# Patient Record
Sex: Male | Born: 1975 | Race: Black or African American | Hispanic: No | Marital: Married | Smoking: Former smoker
Health system: Southern US, Community
[De-identification: ages and names within clinical notes are randomized; demographics above are authoritative.]

## PROBLEM LIST (undated history)

## (undated) DIAGNOSIS — I10 Essential (primary) hypertension: Secondary | ICD-10-CM

## (undated) DIAGNOSIS — R0902 Hypoxemia: Secondary | ICD-10-CM

## (undated) DIAGNOSIS — F1910 Other psychoactive substance abuse, uncomplicated: Secondary | ICD-10-CM

## (undated) DIAGNOSIS — M1990 Unspecified osteoarthritis, unspecified site: Secondary | ICD-10-CM

## (undated) DIAGNOSIS — F909 Attention-deficit hyperactivity disorder, unspecified type: Secondary | ICD-10-CM

## (undated) DIAGNOSIS — K219 Gastro-esophageal reflux disease without esophagitis: Secondary | ICD-10-CM

## (undated) DIAGNOSIS — I2510 Atherosclerotic heart disease of native coronary artery without angina pectoris: Secondary | ICD-10-CM

## (undated) DIAGNOSIS — I219 Acute myocardial infarction, unspecified: Secondary | ICD-10-CM

## (undated) DIAGNOSIS — T7840XA Allergy, unspecified, initial encounter: Secondary | ICD-10-CM

## (undated) DIAGNOSIS — M51369 Other intervertebral disc degeneration, lumbar region without mention of lumbar back pain or lower extremity pain: Secondary | ICD-10-CM

## (undated) DIAGNOSIS — E669 Obesity, unspecified: Secondary | ICD-10-CM

## (undated) DIAGNOSIS — M5126 Other intervertebral disc displacement, lumbar region: Secondary | ICD-10-CM

## (undated) DIAGNOSIS — G4730 Sleep apnea, unspecified: Secondary | ICD-10-CM

## (undated) DIAGNOSIS — M5136 Other intervertebral disc degeneration, lumbar region: Secondary | ICD-10-CM

## (undated) DIAGNOSIS — E7800 Pure hypercholesterolemia, unspecified: Secondary | ICD-10-CM

## (undated) DIAGNOSIS — Z8719 Personal history of other diseases of the digestive system: Secondary | ICD-10-CM

## (undated) HISTORY — DX: Attention-deficit hyperactivity disorder, unspecified type: F90.9

## (undated) HISTORY — DX: Atherosclerotic heart disease of native coronary artery without angina pectoris: I25.10

## (undated) HISTORY — PX: CORONARY ANGIOPLASTY: SHX604

## (undated) HISTORY — DX: Allergy, unspecified, initial encounter: T78.40XA

## (undated) HISTORY — DX: Sleep apnea, unspecified: G47.30

## (undated) HISTORY — DX: Other psychoactive substance abuse, uncomplicated: F19.10

## (undated) HISTORY — DX: Essential (primary) hypertension: I10

## (undated) HISTORY — DX: Obesity, unspecified: E66.9

## (undated) HISTORY — DX: Pure hypercholesterolemia, unspecified: E78.00

## (undated) HISTORY — DX: Hypoxemia: R09.02

## (undated) HISTORY — DX: Unspecified osteoarthritis, unspecified site: M19.90

## (undated) HISTORY — PX: CARDIAC CATHETERIZATION: SHX172

## (undated) HISTORY — DX: Acute myocardial infarction, unspecified: I21.9

## (undated) HISTORY — DX: Gastro-esophageal reflux disease without esophagitis: K21.9

## (undated) HISTORY — PX: ABSCESS DRAINAGE: SHX1119

---

## 2001-06-08 HISTORY — PX: ABSCESS DRAINAGE: SHX1119

## 2009-04-03 ENCOUNTER — Emergency Department (HOSPITAL_COMMUNITY): Admission: EM | Admit: 2009-04-03 | Discharge: 2009-04-03 | Payer: Self-pay | Admitting: Emergency Medicine

## 2010-04-23 ENCOUNTER — Emergency Department (HOSPITAL_COMMUNITY): Admission: EM | Admit: 2010-04-23 | Discharge: 2010-04-24 | Payer: Self-pay | Admitting: Emergency Medicine

## 2010-08-29 ENCOUNTER — Institutional Professional Consult (permissible substitution): Payer: Self-pay | Admitting: Cardiology

## 2010-09-11 LAB — POCT I-STAT, CHEM 8
Calcium, Ion: 1.23 mmol/L (ref 1.12–1.32)
Chloride: 101 mEq/L (ref 96–112)
Creatinine, Ser: 1 mg/dL (ref 0.4–1.5)
Hemoglobin: 16.3 g/dL (ref 13.0–17.0)
Potassium: 4.1 mEq/L (ref 3.5–5.1)
Sodium: 138 mEq/L (ref 135–145)
TCO2: 28 mmol/L (ref 0–100)

## 2010-09-11 LAB — URINALYSIS, ROUTINE W REFLEX MICROSCOPIC
Hgb urine dipstick: NEGATIVE
Nitrite: NEGATIVE
Urobilinogen, UA: 0.2 mg/dL (ref 0.0–1.0)
pH: 7 (ref 5.0–8.0)

## 2010-10-01 ENCOUNTER — Ambulatory Visit (INDEPENDENT_AMBULATORY_CARE_PROVIDER_SITE_OTHER): Payer: Medicaid Other | Admitting: Cardiology

## 2010-10-01 ENCOUNTER — Encounter: Payer: Self-pay | Admitting: Cardiology

## 2010-10-01 VITALS — BP 144/100 | HR 60 | Ht 75.0 in | Wt 290.4 lb

## 2010-10-01 DIAGNOSIS — R079 Chest pain, unspecified: Secondary | ICD-10-CM

## 2010-10-01 DIAGNOSIS — E1169 Type 2 diabetes mellitus with other specified complication: Secondary | ICD-10-CM | POA: Insufficient documentation

## 2010-10-01 DIAGNOSIS — E7800 Pure hypercholesterolemia, unspecified: Secondary | ICD-10-CM

## 2010-10-01 DIAGNOSIS — E785 Hyperlipidemia, unspecified: Secondary | ICD-10-CM | POA: Insufficient documentation

## 2010-10-01 DIAGNOSIS — R12 Heartburn: Secondary | ICD-10-CM | POA: Insufficient documentation

## 2010-10-01 DIAGNOSIS — I10 Essential (primary) hypertension: Secondary | ICD-10-CM | POA: Insufficient documentation

## 2010-10-01 DIAGNOSIS — E119 Type 2 diabetes mellitus without complications: Secondary | ICD-10-CM | POA: Insufficient documentation

## 2010-10-01 DIAGNOSIS — R0789 Other chest pain: Secondary | ICD-10-CM | POA: Insufficient documentation

## 2010-10-01 NOTE — Assessment & Plan Note (Signed)
I have recommended that he take his Crestor. He admits to being noncompliant. He needs to eat a heart healthy diet and lose weight.

## 2010-10-01 NOTE — Progress Notes (Signed)
[name] [name] Date of Birth: [birth year]   History of Present Illness: Mr. [name] [name] is seen at the request of Dr. Gertie Gowda for evaluation of chest pain. He is a 35 year old African American male who has multiple cardiac risk factors including a history of diabetes, hyperlipidemia, and hypertension. He states over the past one to 2 months he has been experiencing symptoms of burning chest pain across his precordium associated with exertion. He notices this particularly when he is walking fast or gas to walk up a lot of stairs. He has also noticed his during sex and with jogging. His symptoms resolve with rest and less than 5-10 minutes. He's had no resting symptoms. He doesn't notice as much discomfort when he his weight lifting. He has no known cardiac history. There is also a family history of early coronary disease.  Current Outpatient Prescriptions on File Prior to Visit  Medication Sig Dispense Refill  . lisinopril (PRINIVIL,ZESTRIL) 20 MG tablet Take 20 mg by mouth daily.        . metFORMIN (GLUCOPHAGE) 500 MG tablet Take 500 mg by mouth daily with breakfast.        . QUEtiapine Fumarate (SEROQUEL PO) Take by mouth daily.        [name] triamterene-hydrochlorothiazide (MAXZIDE-25) 37.5-25 MG per tablet Take 1 tablet by mouth daily.        . rosuvastatin (CRESTOR) 20 MG tablet Take 20 mg by mouth daily.          Allergies  Allergen Reactions  . Penicillins     Past Medical History  Diagnosis Date  . Hypertension   . Heartburn   . Diabetes mellitus   . Hypercholesterolemia   . ADHD (attention deficit hyperactivity disorder)     Past Surgical History  Procedure Date  . Abscess drainage     right arm; approx 2003    History  Smoking status  . Former Smoker  . Types: Cigarettes  . Quit date: 09/30/2009  Smokeless tobacco  . Not on file    History  Alcohol Use  . 6.0 oz/week  . 7 Cans of beer, 3 Shots of liquor per week    Family History  Problem Relation Age of Onset    . Hypertension Mother   . Diabetes Mother   . Hypertension Father   . Diabetes Father   . Heart disease Father     cabg 3; 55  . Hypertension Sister     Review of Systems: The review of systems is positive for tingling in his feet. He has no history of stroke or TIA. He denies any bleeding difficulty. All other systems were reviewed and are negative.  Physical Exam: BP 144/100  Pulse 60  Ht 6\' 3"  (1.905 m)  Wt 290 lb 6.4 oz (131.725 kg)  BMI 36.30 kg/m2 The patient is alert and oriented x 3.  The mood and affect are normal.  The skin is warm and dry. He has multiple tattoos. Color is normal.  The HEENT exam reveals that the sclera are nonicteric.  The mucous membranes are moist.  The carotids are 2+ without bruits.  There is no thyromegaly.  There is no JVD.  The lungs are clear.  The chest wall is non tender.  The heart exam reveals a regular rate with a normal S1 and S2.  There are no murmurs, gallops, or rubs.  The PMI is not displaced.   Abdominal exam reveals good bowel sounds.  There is no guarding or rebound.  There is no hepatosplenomegaly or tenderness.  There are no masses.  Exam of the legs reveal no clubbing, cyanosis, or edema.  The legs are without rashes.  The distal pulses are intact.  Cranial nerves II - XII are intact.  Motor and sensory functions are intact.  The gait is normal.  LABORATORY DATA: ECG demonstrates normal sinus rhythm with a first degree AV block. Pulse is 62 beats per minute. His ECG is otherwise normal. Blood work dated August 19, 2010 demonstrated normal CBC, TSH, RPR, B12 and folate levels, and complete chemistry panel. ALT was slightly elevated at 56. LDL cholesterol was 180.  Assessment / Plan:

## 2010-10-01 NOTE — Patient Instructions (Signed)
We will schedule you for a nuclear stress test to evaluate your chest pain.  Start taking ASA 325 mg daily.  Be sure to take your other medications daily.

## 2010-10-01 NOTE — Assessment & Plan Note (Signed)
Continue current therapy with lisinopril. Recommended low sodium.

## 2010-10-01 NOTE — Assessment & Plan Note (Signed)
This patient has multiple cardiac risk factors and has exertional chest pain. He is at intermediate to high risk of coronary disease. I recommended the addition of aspirin 325 mg daily. I have encouraged him to take his medications daily as he has been noncompliant. We will schedule him for a stress Myoview study. If abnormal he will need a cardiac catheterization.

## 2010-10-07 HISTORY — PX: CORONARY STENT PLACEMENT: SHX1402

## 2010-10-13 ENCOUNTER — Encounter: Payer: Self-pay | Admitting: *Deleted

## 2010-10-13 ENCOUNTER — Ambulatory Visit (INDEPENDENT_AMBULATORY_CARE_PROVIDER_SITE_OTHER): Payer: Medicaid Other | Admitting: Internal Medicine

## 2010-10-13 ENCOUNTER — Encounter: Payer: Self-pay | Admitting: Internal Medicine

## 2010-10-13 ENCOUNTER — Ambulatory Visit (HOSPITAL_BASED_OUTPATIENT_CLINIC_OR_DEPARTMENT_OTHER): Payer: Medicaid Other | Admitting: Radiology

## 2010-10-13 ENCOUNTER — Other Ambulatory Visit: Payer: Self-pay | Admitting: Internal Medicine

## 2010-10-13 DIAGNOSIS — R079 Chest pain, unspecified: Secondary | ICD-10-CM

## 2010-10-13 DIAGNOSIS — I10 Essential (primary) hypertension: Secondary | ICD-10-CM

## 2010-10-13 DIAGNOSIS — I491 Atrial premature depolarization: Secondary | ICD-10-CM

## 2010-10-13 DIAGNOSIS — E7800 Pure hypercholesterolemia, unspecified: Secondary | ICD-10-CM

## 2010-10-13 DIAGNOSIS — I4949 Other premature depolarization: Secondary | ICD-10-CM

## 2010-10-13 DIAGNOSIS — E119 Type 2 diabetes mellitus without complications: Secondary | ICD-10-CM

## 2010-10-13 LAB — BASIC METABOLIC PANEL
BUN: 9 mg/dL (ref 6–23)
CO2: 23 mEq/L (ref 19–32)
Glucose, Bld: 89 mg/dL (ref 70–99)
Sodium: 140 mEq/L (ref 135–145)

## 2010-10-13 LAB — CBC WITH DIFFERENTIAL/PLATELET
Basophils Absolute: 0.1 10*3/uL (ref 0.0–0.1)
Basophils Relative: 1 % (ref 0–1)
Eosinophils Absolute: 0.4 10*3/uL (ref 0.0–0.7)
Eosinophils Relative: 6 % — ABNORMAL HIGH (ref 0–5)
MCH: 31.1 pg (ref 26.0–34.0)
Monocytes Absolute: 0.3 10*3/uL (ref 0.1–1.0)
RBC: 4.73 MIL/uL (ref 4.22–5.81)

## 2010-10-13 LAB — PROTIME-INR: Prothrombin Time: 12.3 seconds (ref 11.6–15.2)

## 2010-10-13 LAB — APTT: aPTT: 32 seconds (ref 24–37)

## 2010-10-13 MED ORDER — REGADENOSON 0.4 MG/5ML IV SOLN
0.4000 mg | Freq: Once | INTRAVENOUS | Status: AC
  Administered 2010-10-13: 0.4 mg via INTRAVENOUS

## 2010-10-13 MED ORDER — TECHNETIUM TC 99M TETROFOSMIN IV KIT
33.0000 | PACK | Freq: Once | INTRAVENOUS | Status: AC | PRN
  Administered 2010-10-13: 33 via INTRAVENOUS

## 2010-10-13 NOTE — Progress Notes (Signed)
Patient is a 35 year old with no prior cardiac history.  He presents today after having a myoview done earlier today. The patient was referred by his internist for evaluation of CP. He says his symptoms began about 2 months ago.  Only occur with exercise.  Most recently he had a spell while mowing the lawn.  Does note SOB with acitvity but none at rest.  No PND. Today, he walked 8:41 sec in Bruce protocol.  Could not get HR up adequately.   DId have some CP .  Switched to [employer].  Experienced signif pain that went away with recovery. EKG showed some PVCs.   Myoview stress only images showed decreased uptake in the anterior wall.  He is currently pain free.  No SOB. HPI  Allergies  Allergen Reactions  . Penicillins     Current Outpatient Prescriptions  Medication Sig Dispense Refill  . lisinopril (PRINIVIL,ZESTRIL) 20 MG tablet Take 20 mg by mouth daily.        . metFORMIN (GLUCOPHAGE) 500 MG tablet Take 500 mg by mouth daily with breakfast.        . QUEtiapine Fumarate (SEROQUEL PO) Take by mouth daily.        [name] triamterene-hydrochlorothiazide (MAXZIDE-25) 37.5-25 MG per tablet Take 1 tablet by mouth. Not taking with the lisinopril.      [name] DISCONTD: rosuvastatin (CRESTOR) 20 MG tablet Take 20 mg by mouth daily. Patient never had this filled       No current facility-administered medications for this visit.   Facility-Administered Medications Ordered in Other Visits  Medication Dose Route Frequency Provider Last Rate Last Dose  . regadenoson (LEXISCAN) injection SOLN 0.4 mg  0.4 mg Intravenous Once Wendall Stade, MD   0.4 mg at 10/13/10 1314  . technetium tetrofosmin (TC-MYOVIEW) injection 33 milli Curie  33 milli Curie Intravenous Once PRN Wendall Stade, MD   33 milli Curie at 10/13/10 1315    Past Medical History  Diagnosis Date  . Hypertension   . Heartburn   . Diabetes mellitus   . Hypercholesterolemia   . ADHD (attention deficit hyperactivity disorder)   . Chest pain      Past Surgical History  Procedure Date  . Abscess drainage     right arm; approx 2003    Family History  Problem Relation Age of Onset  . Hypertension Mother   . Diabetes Mother   . Hypertension Father   . Diabetes Father   . Heart disease Father     cabg 3; 65  . Hypertension Sister     History   Social History  . Marital Status: Married    Spouse Name: N/A    Number of Children: 4  . Years of Education: N/A   Occupational History  . disabled    Social History Main Topics  . Smoking status: Former Smoker    Types: Cigarettes    Quit date: 09/30/2009  . Smokeless tobacco: Not on file  . Alcohol Use: 6.0 oz/week    7 Cans of beer, 3 Shots of liquor per week  . Drug Use: No  . Sexually Active: Not on file   Other Topics Concern  . Not on file   Social History Narrative  . No narrative on file    Review of Systems:  All systems reviewed.  They are negative to the above problem except as previously stated.  Father had CABG at age 18  Vital Signs: BP 120/84  Pulse  68  Ht 6\' 3"  (1.905 m)  Wt 288 lb (130.636 kg)  BMI 36.00 kg/m2  Physical Exam Patient is in NAD .  NO CP.  No SOB. HEENT:  Normocephalic, atraumatic. EOMI, PERRLA.  Neck: JVP is normal. No thyromegaly. No bruits.  Lungs: clear to auscultation. No rales no wheezes.  Heart: Regular rate and rhythm. Normal S1, S2. No S3.   No significant murmurs. PMI not displaced.  Abdomen:  Supple, nontender. Normal bowel sounds. No masses. No hepatomegaly.  Extremities:   Good distal pulses throughout. No lower extremity edema.  Musculoskeletal :moving all extremities.  Neuro:   alert and oriented x3.  CN II-XII grossly intact.   Assessment and Plan:

## 2010-10-13 NOTE — Assessment & Plan Note (Signed)
Will need to be followed. 

## 2010-10-13 NOTE — Patient Instructions (Addendum)
Lab work today Your physician has requested that you have a cardiac catheterization. Cardiac catheterization is used to diagnose and/or treat various heart conditions. Doctors may recommend this procedure for a number of different reasons. The most common reason is to evaluate chest pain. Chest pain can be a symptom of coronary artery disease (CAD), and cardiac catheterization can show whether plaque is narrowing or blocking your heart's arteries. This procedure is also used to evaluate the valves, as well as measure the blood flow and oxygen levels in different parts of your heart. For further information please visit [email or web address] Please follow instruction sheet, as given.

## 2010-10-13 NOTE — Progress Notes (Signed)
[facility] SITE 3 NUCLEAR MED [address] [place] [address] [phone]  Cardiology Nuclear Med Study  [name] [name] is a 35 y.o. male [id number] [birth year]   Nuclear Med Background Indication for Stress Test:  Evaluation for Ischemia History:  No previous documented CAD Cardiac Risk Factors: Family History - CAD, History of Smoking, Hypertension, Lipids and NIDDM  Symptoms:  Chest Pain with Exertion (last date of chest discomfort last night), Fatigue with Exertion and Light-Headedness   Nuclear Pre-Procedure Caffeine/Decaff Intake:  None NPO After: 7:00am   Lungs:  Clear IV 0.9% NS with Angio Cath:  22g  IV Site: R Antecubital  IV Started by:  Irean Hong, RN  Chest Size (in):  52 Cup Size: n/a  Height: 6\' 3"  (1.905 m)  Weight:  286 lb (129.729 kg)  BMI:  Body mass index is 35.75 kg/(m^2). Tech Comments:  n/a    Nuclear Med Study 1 or 2 day study: 1 Day  Stress Test Type:  Treadmill/Lexiscan  Reading MD: Arvilla Meres MD  Order Authorizing Provider:  Peter Swaziland, MD  Resting Radionuclide: Technetium 86m Tetrofosmin  Resting Radionuclide Dose: n/a mCi   Stress Radionuclide:  Technetium 25m Tetrofosmin  Stress Radionuclide Dose: 33 mCi           Stress Protocol Rest HR: 73 Stress HR: 153  Rest BP: 111/65 Stress BP: 189/100  Exercise Time (min): 10:45 METS: 10.1   Predicted Max HR: 187 bpm % Max HR: 81.82 bpm Rate Pressure Product: 02725   Dose of Adenosine (mg):  n/a Dose of Lexiscan: 0.4 mg  Dose of Atropine (mg): n/a Dose of Dobutamine: n/a mcg/kg/min (at max HR)  Stress Test Technologist: Irean Hong, RN  Nuclear Technologist:  Domenic Polite, CNMT     Rest Procedure:  Myocardial perfusion imaging was performed at rest 45 minutes following the intravenous administration of Technetium 46m Tetrofosmin. Rest ECG: NSR with 1st AVB, PAC's with blocked PAC's, and 1.7 second pause , PVC  Stress Procedure:  The patient attempted to  walk the treadmill utilizing the Bruce protocol for 8:35, but unable to continue due to chest burning 10/10. The patient received IV Lexiscan 0.4 mg over 15-seconds with concurrent low level exercise, and chest burning decreased to 8/10. Technetium 31m Tetrofosmin was injected at 30-seconds while the patient continued walking. There were no significant changes with Lexiscan. There was hypertensive response to exercise. The patient had frequent PAC's, blocked PAC's, occasional PVC's, couplets x 2, and 1.7 second pause at baseline  Quantitative spect images were obtained after a 45-minute delay. Stress ECG: Signifcant artifact. Appears to be mild non-diagnostic infero-lateral ST dperession at peak.  QPS Raw Data Images:  Normal; no motion artifact; normal heart/lung ratio. Stress Images:  There is decreased uptake in the anterior wall. Rest Images:  Not performed Subtraction (SDS):  No resting images performed. There is a significant anterior defect on stress images associated with severe exertional angina on treadmill. Transient Ischemic Dilatation (Normal <1.22):  n/a Lung/Heart Ratio (Normal <0.45):  .19   Quantitative Gated Spect Images QGS EDV:  216 ml QGS ESV:  114 ml QGS cine images:  Mild global HK. TID not assessed due to lack of resting images. QGS EF: 47%  Impression Exercise Capacity:  Lexiscan with low level exercise. BP Response:  Normal blood pressure response. Clinical Symptoms:  Significant chest pain. ECG Impression:  Mild nn-diagnostic infero-lateral ST depression Comparison with Prior Nuclear Study: No previous nuclear study performed  Overall  Impression:  Abnormal stress nuclear study. Stress test suggestive of anterior ischemia. Seen by Dr. Tenny Craw and arranged for cath today.

## 2010-10-13 NOTE — Assessment & Plan Note (Signed)
Continue meds.  Get labs.

## 2010-10-13 NOTE — Assessment & Plan Note (Signed)
Myoview with signif defect anteriorly.  Rest images not done.  History is concerning. I would recomm LHcath to define anatomy.  He is not having any rest pain Lenoria Farrier get precath labs.  Patient has chewed 4  Baby aspirin.    NTG given incase he has any CP.  He was told to call 911 if he does. Plan cath tomorrow.

## 2010-10-14 ENCOUNTER — Observation Stay (HOSPITAL_COMMUNITY)
Admission: RE | Admit: 2010-10-14 | Discharge: 2010-10-15 | Disposition: A | Discharge status: Home / Self Care | Payer: Medicaid Other | Source: Ambulatory Visit | Attending: Cardiology | Admitting: Cardiology

## 2010-10-14 ENCOUNTER — Encounter (HOSPITAL_COMMUNITY): Payer: Medicaid Other | Admitting: Radiology

## 2010-10-14 DIAGNOSIS — E785 Hyperlipidemia, unspecified: Secondary | ICD-10-CM | POA: Insufficient documentation

## 2010-10-14 DIAGNOSIS — I2510 Atherosclerotic heart disease of native coronary artery without angina pectoris: Principal | ICD-10-CM | POA: Insufficient documentation

## 2010-10-14 DIAGNOSIS — I10 Essential (primary) hypertension: Secondary | ICD-10-CM | POA: Insufficient documentation

## 2010-10-14 DIAGNOSIS — R079 Chest pain, unspecified: Secondary | ICD-10-CM | POA: Insufficient documentation

## 2010-10-14 DIAGNOSIS — E119 Type 2 diabetes mellitus without complications: Secondary | ICD-10-CM | POA: Insufficient documentation

## 2010-10-14 LAB — GLUCOSE, CAPILLARY: Glucose-Capillary: 99 mg/dL (ref 70–99)

## 2010-10-15 LAB — CBC
HCT: 40.2 % (ref 39.0–52.0)
MCH: 30.7 pg (ref 26.0–34.0)
RBC: 4.43 MIL/uL (ref 4.22–5.81)
WBC: 7 10*3/uL (ref 4.0–10.5)

## 2010-10-15 LAB — BASIC METABOLIC PANEL
BUN: 8 mg/dL (ref 6–23)
Chloride: 106 mEq/L (ref 96–112)
GFR calc Af Amer: >60 mL/min (ref >60)
Sodium: 140 mEq/L (ref 135–145)

## 2010-10-15 LAB — GLUCOSE, CAPILLARY

## 2010-10-16 ENCOUNTER — Encounter: Payer: Self-pay | Admitting: Cardiology

## 2010-10-17 ENCOUNTER — Emergency Department (HOSPITAL_COMMUNITY)
Admission: EM | Admit: 2010-10-17 | Discharge: 2010-10-18 | Disposition: A | Discharge status: Home / Self Care | Payer: Medicaid Other | Attending: Emergency Medicine | Admitting: Emergency Medicine

## 2010-10-17 ENCOUNTER — Emergency Department (HOSPITAL_COMMUNITY): Payer: Medicaid Other

## 2010-10-17 DIAGNOSIS — Z7982 Long term (current) use of aspirin: Secondary | ICD-10-CM | POA: Insufficient documentation

## 2010-10-17 DIAGNOSIS — R112 Nausea with vomiting, unspecified: Secondary | ICD-10-CM | POA: Insufficient documentation

## 2010-10-17 DIAGNOSIS — R51 Headache: Secondary | ICD-10-CM | POA: Insufficient documentation

## 2010-10-17 DIAGNOSIS — Z79899 Other long term (current) drug therapy: Secondary | ICD-10-CM | POA: Insufficient documentation

## 2010-10-17 LAB — POCT CARDIAC MARKERS

## 2010-10-17 LAB — COMPREHENSIVE METABOLIC PANEL
ALT: 38 U/L (ref 0–53)
Alkaline Phosphatase: 66 U/L (ref 39–117)
CO2: 23 mEq/L (ref 19–32)
Chloride: 98 mEq/L (ref 96–112)
GFR calc non Af Amer: 45 mL/min — ABNORMAL LOW (ref >60)
Glucose, Bld: 102 mg/dL — ABNORMAL HIGH (ref 70–99)
Potassium: 4.1 mEq/L (ref 3.5–5.1)
Sodium: 135 mEq/L (ref 135–145)

## 2010-10-17 LAB — CBC
HCT: 46.3 % (ref 39.0–52.0)
Hemoglobin: 16.3 g/dL (ref 13.0–17.0)
WBC: 15.1 10*3/uL — ABNORMAL HIGH (ref 4.0–10.5)

## 2010-10-17 LAB — DIFFERENTIAL
Eosinophils Absolute: 0.1 10*3/uL (ref 0.0–0.7)
Eosinophils Relative: 1 % (ref 0–5)
Lymphocytes Relative: 24 % (ref 12–46)
Neutrophils Relative %: 69 % (ref 43–77)

## 2010-10-18 LAB — URINALYSIS, ROUTINE W REFLEX MICROSCOPIC
Ketones, ur: NEGATIVE mg/dL
Nitrite: NEGATIVE
Specific Gravity, Urine: 1.013 (ref 1.005–1.030)
pH: 5.5 (ref 5.0–8.0)

## 2010-10-18 LAB — POCT CARDIAC MARKERS: Troponin i, poc: <0.05 ng/mL (ref 0.00–0.09)

## 2010-10-20 NOTE — Discharge Summary (Addendum)
[name], [name]             ACCOUNT NO.:  [id number]  MEDICAL RECORD NO.:  [id number]           PATIENT TYPE:  O  LOCATION:  6526                         FACILITY:  [facility]  PHYSICIAN:  Peter M. Swaziland, M.D.  DATE OF BIRTH:  July 12, 1975  DATE OF ADMISSION:  10/14/2010 DATE OF DISCHARGE:  10/15/2010                              DISCHARGE SUMMARY   DISCHARGE DIAGNOSES: 1. Newly diagnosed coronary artery disease by cath this admission.     a.     Cath was done because of an outpatient abnormal stress      Myoview.     b.     A 95% proximal left anterior descending stenosis status post      bare-metal stent, with residual 99% stenosis in the proximal      diagonal that was very small and less than 10% stenosis in the      circumflex.     c.     Normal left ventricular function with ejection fraction of      60% by cath, Oct 14, 2010. 2. Hyperlipidemia. 3. Hypertension. 4. Diabetes mellitus, non-insulin dependent. 5. Attention deficit hyperactivity disorder.  HOSPITAL COURSE:  [name] [name] is a 35 year old gentleman with history significant for hypertension, hyperlipidemia, and diabetes who presented to Dr. Elvis Coil office after referral from his primary care provider with complaints of 2 months of exertional chest pain, also occurring during sex and jogging, resolving with rest.  He has had no resting symptoms.  He also has a family history of early coronary disease.  He was started on aspirin and set up for a stress Myoview which was done on Oct 13, 2010, which was abnormal.  He was subsequently referred for cardiac catheterization, underwent this procedure yesterday with the above findings, and status post bare-metal stent placement to his proximal LAD.  EF was normal.  The patient tolerated the procedure well and has been doing well without complaints.  His blood pressure was on the higher side here in the hospital, so his lisinopril was increased to 40 mg daily.  He is  continued on his other home medicines with the exception of holding metformin post catheterization for 2 days.  He was not started on beta-blocker secondary to nocturnal bradycardia with block and PACs.  This can be reassessed as an outpatient as needed.  Dr. Swaziland has seen and examined him today and feels he is stable for discharge.  DISCHARGE LABORATORY DATA:  WBC 7.0, hemoglobin 13.6, hematocrit 40.2, platelet count 213.  Sodium 140, potassium 4.0, chloride 106, CO2 is 26, glucose 104, BUN 8, creatinine 0.82.  STUDIES:  Cardiac catheterization, Oct 14, 2010, please see full report for details as well as HPI for summary.  DISCHARGE MEDICATIONS: 1. Aspirin 81 mg daily. 2. Nitroglycerin sublingual 0.4 mg every 5 minutes as needed up to     three doses for chest pain. 3. Effient 10 mg daily. 4. Lisinopril 40 mg daily. 5. Crestor 20 mg daily. 6. Metformin 500 mg daily with instructions not to start until Oct 17, 2010. 7. Seroquel 50 mg one tablet daily. 8. Triamterene  and hydrochlorothiazide 37.5/25 mg daily.  DISPOSITION:  [name] [name] will be discharged in stable condition to home.  He is not to lift over 5 pounds for a week, participate in sexual activity for one week, or drive for 2 days.  He is to follow a low- sodium, heart-healthy diabetic diet and call or return if he notices any pain, swelling, bleeding, or pus at his cath site.  He will see Dante Gang, NP, for his first post hospital visit in [facility] on Oct 29, 2010, at 2:15 p.m.  DURATION OF DISCHARGE ENCOUNTER:  Greater than 30 minutes including physician and PA time.     Ronie Spies, P.A.C.   ______________________________ Peter M. Swaziland, M.D.    DD/MEDQ  D:  10/15/2010  T:  10/15/2010  Job:  [id number]  Electronically Signed by PETER Swaziland M.D. on 10/20/2010 09:40:04 AM Electronically Signed by Ronie Spies  on 10/27/2010 01:35:42 PM

## 2010-10-20 NOTE — Cardiovascular Report (Signed)
[name] [name], CARTON             ACCOUNT NO.:  [id number]  MEDICAL RECORD NO.:  [id number]           PATIENT TYPE:  O  LOCATION:  6526                         FACILITY:  [facility]  PHYSICIAN:  Peter M. Swaziland, M.D.  DATE OF BIRTH:  1976/04/09  DATE OF PROCEDURE:  10/14/2010 DATE OF DISCHARGE:                           CARDIAC CATHETERIZATION   INDICATIONS FOR PROCEDURE:  A 35 year old African American male with history of hypertension, diabetes, and hyperlipidemia, who presents with symptoms of chest pain over the past 2 months.  He had a stress Myoview study yesterday which was abnormal.  PROCEDURE:  Left heart catheterization, coronary and left ventricular angiography, and intracoronary stenting of the proximal LAD.  ACCESS:  Via the right radial artery using the standard Seldinger technique.  EQUIPMENT USED:  A 5-French easy right 4 cm catheter, 5-French 5-cm left Judkins catheter, 5-French pigtail catheter, 5-French arterial sheath, 6- French arterial sheath, 6-French left VODA4 guide, a Prowater wire, a 2.5- x 15-mm TREK balloon, a 4.0- x 24-mm VeriFLEX stent, a 4.0- x 20-mm [place] TREK balloon.  MEDICATIONS: 1. Versed 2 mg IV. 2. Fentanyl total 50 mcg IV. 3. Verapamil 3 mg intra-arterial. 4. Heparin bolus 5000 units. 5. Angiomax bolus of 0.75 mg/kg with subsequent infusion at 1.75 mg/kg     per hour, subsequent ACT was 376 seconds. 6. Nitroglycerin 200 mcg intracoronary x1. 7. Effient 60 mg p.o.  CONTRAST:  Omnipaque 215 mL.  HEMODYNAMIC DATA:  Aortic pressure is 130/97 with a mean of 114, left ventricular pressure is 132 with EDP of 20 mmHg.  ANGIOGRAPHIC DATA: 1. The right coronary artery arises and distributes normally.  It is a     dominant vessel.  It is normal. 2. The left coronary artery arises and distributes normally.  The left     main coronary is normal. 3. The left anterior descending artery has a segmental 95% stenosis in     the proximal vessel.  This  was a very large vessel.  The first     diagonal is very small in caliber and has a 99% stenosis proximally     with TIMI grade 1 flow. 4. The left circumflex coronary has minor irregularities, less than     10%. 5. Left ventricular angiography performed in the RAO view demonstrates     normal left ventricular size and contractility with normal systolic     function.  Ejection fraction is estimated at 60%.  We proceeded at this point with percutaneous intervention of the LAD. Using the above guide, we were able to cross the lesion easily with a Prowater wire.  We predilated the lesion using a 2.5- x 15-mm TREK balloon.  We then stented the lesion using a 4.0- x 24-mm VeriFLEX stent deployed at 9 atmospheres.  We postdilated using a 4.0- x 20-mm [place] trek balloon up to 16 atmospheres x2.  This yielded an excellent angiographic result with 0% residual stenosis and TIMI grade 3 flow.  It was noted that this was a very difficult procedure from the right radial approach.  We had difficulty cannulating both the right coronary artery and especially the  LAD.  We were unable to get into the right coronary artery with a 4-cm Judkins right.  For the left coronary artery, we tried a 3.5 and 4.0 cm left Judkins catheter, a Jackie left catheter, and easy left coronary catheter without success.  We were able to engage with a diagnostic left five catheter, but we were unable to engage with a left five guide catheter.  The VODA catheter did give [place] excellent support throughout the procedure.  FINAL INTERPRETATION: 1. Single-vessel obstructive atherosclerotic coronary artery disease. 2. Normal left ventricular function. 3. Successful stenting of the proximal left anterior descending using     a bare-metal stent.  PLAN:  We will continue aspirin and Effient.  The patient needs aggressive risk factor modification.          ______________________________ Peter M. Swaziland, M.D.     PMJ/MEDQ  D:   10/14/2010  T:  10/15/2010  Job:  [id number]  cc:   Levander Campion, M.D.  Electronically Signed by PETER Swaziland M.D. on 10/20/2010 09:39:55 AM

## 2010-10-29 ENCOUNTER — Ambulatory Visit (INDEPENDENT_AMBULATORY_CARE_PROVIDER_SITE_OTHER): Payer: Medicaid Other | Admitting: Nurse Practitioner

## 2010-10-29 ENCOUNTER — Encounter: Payer: Self-pay | Admitting: Nurse Practitioner

## 2010-10-29 VITALS — BP 110/80 | HR 84 | Ht 75.0 in | Wt 276.0 lb

## 2010-10-29 DIAGNOSIS — E785 Hyperlipidemia, unspecified: Secondary | ICD-10-CM

## 2010-10-29 DIAGNOSIS — I10 Essential (primary) hypertension: Secondary | ICD-10-CM

## 2010-10-29 DIAGNOSIS — I2510 Atherosclerotic heart disease of native coronary artery without angina pectoris: Secondary | ICD-10-CM

## 2010-10-29 DIAGNOSIS — E7800 Pure hypercholesterolemia, unspecified: Secondary | ICD-10-CM

## 2010-10-29 NOTE — Assessment & Plan Note (Signed)
Blood pressure looks good with his current regimen.

## 2010-10-29 NOTE — Assessment & Plan Note (Signed)
He is doing well. He is on his Plavix. We reviewed the need for exercise and to continue with his efforts at diet and weight loss. I will see him back in about 1 month. Patient is agreeable to this plan and will call if any problems develop in the interim.

## 2010-10-29 NOTE — Assessment & Plan Note (Signed)
He is on Crestor. We will check fasting labs at his next visit.

## 2010-10-29 NOTE — Progress Notes (Signed)
    [name] [name] Date of Birth: 11-29-1975   History of Present Illness: [name] [name] is seen today for a post hospital visit. He is seen for Dr. Sw[place]il[place]. He had an abnormal stress test and then had elective cath with PCI to the LAD. He has a bare metal stent in place. He is on Plavix. Blood pressure has been treated with ACE and diuretic. He is on Metformin for his diabetes. He is doing much better. He has no more burning chest pain. He is back jogging. He was cathed radially and has had no problems with his right arm. He is working on his weight. He is down about 12 pounds. He had stopped smoking last year but has just recently stopped smoking marijuana.  Current Outpatient Prescriptions on File Prior to Visit  Medication Sig Dispense Refill  . aspirin 81 MG tablet Take 81 mg by mouth daily.        . clopidogrel (PLAVIX) 75 MG tablet Take 75 mg by mouth daily.        [name] lisinopril (PRINIVIL,ZESTRIL) 20 MG tablet Take 40 mg by mouth daily.       . metFORMIN (GLUCOPHAGE) 500 MG tablet Take 500 mg by mouth daily with breakfast.        . QUEtiapine Fumarate (SEROQUEL PO) Take by mouth daily.        . rosuvastatin (CRESTOR) 20 MG tablet Take 20 mg by mouth daily.        [name] triamterene-hydrochlorothiazide (MAXZIDE-25) 37.5-25 MG per tablet Take 1 tablet by mouth. Not taking with the lisinopril.        Allergies  Allergen Reactions  . Penicillins     Past Medical History  Diagnosis Date  . Hypertension   . Diabetes mellitus   . Hypercholesterolemia   . ADHD (attention deficit hyperactivity disorder)   . CAD (coronary artery disease)     S/P cath May 2012 with BMS to the LAD    Past Surgical History  Procedure Date  . Abscess drainage     right arm; approx 2003  . Coronary stent placement May 2012    LAD    History  Smoking status  . Former Smoker  . Types: Cigarettes  . Quit date: 09/30/2009  Smokeless tobacco  . Not on file    History  Alcohol Use  . 6.0 oz/week  . 7 Cans  of beer, 3 Shots of liquor per week    Family History  Problem Relation Age of Onset  . Hypertension Mother   . Diabetes Mother   . Hypertension Father   . Diabetes Father   . Heart disease Father     cabg 3; 55  . Hypertension Sister     Review of Systems: The review of systems is as above. Overall he feels great.  All other systems were reviewed and are negative.  Physical Exam: BP 110/80  Pulse 84  Ht 6\' 3"  (1.905 m)  Wt 276 lb (125.193 kg)  BMI 34.50 kg/m2 Patient is pleasant and in no acute distress. Skin is warm and dry. Color is normal. He has multiple tattoos.  HEENT is unremarkable. Normocephalic/atraumatic. PERRL. Sclera are nonicteric. Neck is supple. No masses. No JVD. Lungs are clear. Cardiac exam shows a regular rate and rhythm. Abdomen is soft. Extremities are without edema. Right arm looks fine. Gait and ROM are intact. No gross neurologic deficits noted.  LABORATORY DATA:   Assessment / Plan:

## 2010-10-29 NOTE — Patient Instructions (Signed)
Stay on your current medicines. I will see you back in about 1 month. We will check fasting labs when you come back.

## 2010-11-10 ENCOUNTER — Ambulatory Visit: Payer: Medicaid Other | Admitting: Internal Medicine

## 2010-11-14 ENCOUNTER — Encounter: Payer: Self-pay | Admitting: Cardiology

## 2010-11-14 ENCOUNTER — Other Ambulatory Visit: Payer: Self-pay | Admitting: *Deleted

## 2010-11-14 MED ORDER — PRASUGREL HCL 10 MG PO TABS: 10.0000 mg | ORAL_TABLET | Freq: Every day | ORAL | Status: DC

## 2010-11-14 NOTE — Telephone Encounter (Signed)
Called stating needed refill on Effient. He is not taking Plavix.  Effient required prior authorization. Called [phone] and received prior auth for 1 year. Sent in to pharm.

## 2010-11-17 ENCOUNTER — Ambulatory Visit (HOSPITAL_COMMUNITY): Payer: Medicaid Other

## 2010-11-19 ENCOUNTER — Ambulatory Visit (HOSPITAL_COMMUNITY): Payer: Medicaid Other

## 2010-11-21 ENCOUNTER — Ambulatory Visit (HOSPITAL_COMMUNITY): Payer: Medicaid Other

## 2010-11-24 ENCOUNTER — Ambulatory Visit (HOSPITAL_COMMUNITY): Payer: Medicaid Other

## 2010-11-26 ENCOUNTER — Ambulatory Visit (HOSPITAL_COMMUNITY): Payer: Medicaid Other

## 2010-11-28 ENCOUNTER — Ambulatory Visit (HOSPITAL_COMMUNITY): Payer: Medicaid Other

## 2010-12-01 ENCOUNTER — Encounter: Payer: Self-pay | Admitting: Nurse Practitioner

## 2010-12-01 ENCOUNTER — Other Ambulatory Visit (INDEPENDENT_AMBULATORY_CARE_PROVIDER_SITE_OTHER): Payer: Medicaid Other | Admitting: *Deleted

## 2010-12-01 ENCOUNTER — Ambulatory Visit (INDEPENDENT_AMBULATORY_CARE_PROVIDER_SITE_OTHER): Payer: Medicaid Other | Admitting: Nurse Practitioner

## 2010-12-01 ENCOUNTER — Ambulatory Visit (HOSPITAL_COMMUNITY): Payer: Medicaid Other

## 2010-12-01 ENCOUNTER — Telehealth: Payer: Self-pay | Admitting: *Deleted

## 2010-12-01 VITALS — BP 118/84 | HR 64 | Ht 75.0 in | Wt 271.5 lb

## 2010-12-01 DIAGNOSIS — I2510 Atherosclerotic heart disease of native coronary artery without angina pectoris: Secondary | ICD-10-CM

## 2010-12-01 DIAGNOSIS — I10 Essential (primary) hypertension: Secondary | ICD-10-CM

## 2010-12-01 DIAGNOSIS — E7800 Pure hypercholesterolemia, unspecified: Secondary | ICD-10-CM

## 2010-12-01 DIAGNOSIS — E785 Hyperlipidemia, unspecified: Secondary | ICD-10-CM

## 2010-12-01 LAB — LIPID PANEL
Cholesterol: 152 mg/dL (ref 0–200)
HDL: 39.1 mg/dL (ref >39.00)
LDL Cholesterol: 88 mg/dL (ref 0–99)
Total CHOL/HDL Ratio: 4
Triglycerides: 124 mg/dL (ref 0.0–149.0)
VLDL: 24.8 mg/dL (ref 0.0–40.0)

## 2010-12-01 LAB — BASIC METABOLIC PANEL
BUN: 15 mg/dL (ref 6–23)
CO2: 25 mEq/L (ref 19–32)
Calcium: 9.4 mg/dL (ref 8.4–10.5)
Chloride: 107 mEq/L (ref 96–112)
Creatinine, Ser: 1.1 mg/dL (ref 0.4–1.5)
GFR: 97.88 mL/min (ref >60.00)
Glucose, Bld: 101 mg/dL — ABNORMAL HIGH (ref 70–99)
Potassium: 4.6 mEq/L (ref 3.5–5.1)
Sodium: 138 mEq/L (ref 135–145)

## 2010-12-01 LAB — HEPATIC FUNCTION PANEL
ALT: 35 U/L (ref 0–53)
AST: 19 U/L (ref 0–37)
Albumin: 4.5 g/dL (ref 3.5–5.2)
Alkaline Phosphatase: 58 U/L (ref 39–117)
Bilirubin, Direct: 0.1 mg/dL (ref 0.0–0.3)
Total Bilirubin: 0.5 mg/dL (ref 0.3–1.2)
Total Protein: 7.5 g/dL (ref 6.0–8.3)

## 2010-12-01 NOTE — Telephone Encounter (Signed)
Notified of lab results; will get LP,BMET,HFP in 6 mo. Will schedule recall.

## 2010-12-01 NOTE — Progress Notes (Signed)
[name] Date of Birth: [birth year]   History of Present Illness: [name] [name] is seen today for a one month visit. He is seen for Dr. [place]. He continues to do well. No chest pain or shortness of breath. He has lost 5 more pounds. He is compliant with his medicines. He is on Medicaid and cost is not a factor. He is now on Effient. He does have a bare metal stent in place. He is not smoking marijuana. Overall, he feels good. He does note some slight aching in his right arm ever since his procedure, but that continues to improve.   Current Outpatient Prescriptions on File Prior to Visit  Medication Sig Dispense Refill  . aspirin 81 MG tablet Take 81 mg by mouth daily.        [name] lisinopril (PRINIVIL,ZESTRIL) 20 MG tablet Take 40 mg by mouth daily.       . metFORMIN (GLUCOPHAGE) 500 MG tablet Take 500 mg by mouth daily with breakfast.        . prasugrel (EFFIENT) 10 MG TABS Take 1 tablet (10 mg total) by mouth daily.  30 tablet  5  . QUEtiapine Fumarate (SEROQUEL PO) Take by mouth daily.        . rosuvastatin (CRESTOR) 20 MG tablet Take 20 mg by mouth daily.        [name] triamterene-hydrochlorothiazide (MAXZIDE-25) 37.5-25 MG per tablet Take 1 tablet by mouth. Not taking with the lisinopril.      [name] DISCONTD: clopidogrel (PLAVIX) 75 MG tablet Take 75 mg by mouth daily.        [name] DISCONTD: prasugrel (EFFIENT) 10 MG TABS Take 10 mg by mouth daily.          Allergies  Allergen Reactions  . Penicillins     Past Medical History  Diagnosis Date  . Hypertension   . Diabetes mellitus   . Hypercholesterolemia   . ADHD (attention deficit hyperactivity disorder)   . CAD (coronary artery disease)     S/P cath May 2012 with BMS to the LAD    Past Surgical History  Procedure Date  . Abscess drainage     right arm; approx 2003  . Coronary stent placement May 2012    LAD    History  Smoking status  . Former Smoker  . Types: Cigarettes  . Quit date: 09/30/2009  Smokeless tobacco  . Not on  file    History  Alcohol Use  . 6.0 oz/week  . 7 Cans of beer, 3 Shots of liquor per week    Family History  Problem Relation Age of Onset  . Hypertension Mother   . Diabetes Mother   . Hypertension Father   . Diabetes Father   . Heart disease Father     cabg 3; 65  . Hypertension Sister     Review of Systems: The review of systems is positive for mild right arm aching.  All other systems were reviewed and are negative.  Physical Exam: BP 118/84  Pulse 64  Ht 6\' 3"  (1.905 m)  Wt 271 lb 8 oz (123.152 kg)  BMI 33.94 kg/m2 Patient is very pleasant and in no acute distress. He has multiple tattoos. He is rather large. Skin is warm and dry. Color is normal.  HEENT is unremarkable. Normocephalic/atraumatic. PERRL. Sclera are nonicteric. Neck is supple. No masses. No JVD. Lungs are clear. Cardiac exam shows a regular rate and rhythm. Abdomen is soft. Extremities are without edema. Gait and  ROM are intact. No gross neurologic deficits noted.  LABORATORY DATA: Pending  Assessment / Plan:

## 2010-12-01 NOTE — Assessment & Plan Note (Signed)
He is going to get his own blood pressure cuff and monitor at home. If he continues to lose weight, we may have to cut back his medicines. He is encouraged by this possibility.

## 2010-12-01 NOTE — Assessment & Plan Note (Signed)
Labs are checked today.  

## 2010-12-01 NOTE — Telephone Encounter (Signed)
Message copied by Lorayne Bender on Mon Dec 01, 2010  5:06 PM ------      Message from: Rosalio Macadamia      Created: Mon Dec 01, 2010  3:00 PM       Ok to report. Labs are satisfactory.  Stay on same medicines. Recheck BMET/HPF/LIPIDS  in 6 months.

## 2010-12-01 NOTE — Patient Instructions (Addendum)
We will see what your labs look like Stay on your current medicines Keep up with your exercise. Goal is 45 minutes each day. We will see you in 3 months. Monitor your blood pressure at home and record your readings. If readings are less than 115/80 consistently, let us know.

## 2010-12-01 NOTE — Assessment & Plan Note (Signed)
He continues to do well. I have left him on his current medicines including the Effient for now. He will see Dr. Sw[place]iland in about 3 months. He is to continue with smoking cessation, exercise and good diabetic control. Patient is agreeable to this plan and will call if any problems develop in the interim.

## 2010-12-03 ENCOUNTER — Ambulatory Visit (HOSPITAL_COMMUNITY): Payer: Medicaid Other

## 2010-12-05 ENCOUNTER — Ambulatory Visit (HOSPITAL_COMMUNITY): Payer: Medicaid Other

## 2010-12-08 ENCOUNTER — Ambulatory Visit (HOSPITAL_COMMUNITY): Payer: Medicaid Other

## 2010-12-10 ENCOUNTER — Ambulatory Visit (HOSPITAL_COMMUNITY): Payer: Medicaid Other

## 2010-12-12 ENCOUNTER — Other Ambulatory Visit: Payer: Self-pay | Admitting: *Deleted

## 2010-12-12 ENCOUNTER — Ambulatory Visit (HOSPITAL_COMMUNITY): Payer: Medicaid Other

## 2010-12-12 MED ORDER — LISINOPRIL 40 MG PO TABS: 40.0000 mg | ORAL_TABLET | Freq: Every day | ORAL | Status: DC

## 2010-12-12 NOTE — Telephone Encounter (Signed)
escribe medication per fax request  

## 2010-12-15 ENCOUNTER — Ambulatory Visit (HOSPITAL_COMMUNITY): Payer: Medicaid Other

## 2010-12-17 ENCOUNTER — Ambulatory Visit (HOSPITAL_COMMUNITY): Payer: Medicaid Other

## 2010-12-19 ENCOUNTER — Ambulatory Visit (HOSPITAL_COMMUNITY): Payer: Medicaid Other

## 2010-12-22 ENCOUNTER — Ambulatory Visit (HOSPITAL_COMMUNITY): Payer: Medicaid Other

## 2010-12-24 ENCOUNTER — Ambulatory Visit (HOSPITAL_COMMUNITY): Payer: Medicaid Other

## 2010-12-26 ENCOUNTER — Ambulatory Visit (HOSPITAL_COMMUNITY): Payer: Medicaid Other

## 2010-12-29 ENCOUNTER — Ambulatory Visit (HOSPITAL_COMMUNITY): Payer: Medicaid Other

## 2010-12-31 ENCOUNTER — Ambulatory Visit (HOSPITAL_COMMUNITY): Payer: Medicaid Other

## 2011-01-02 ENCOUNTER — Ambulatory Visit (HOSPITAL_COMMUNITY): Payer: Medicaid Other

## 2011-01-05 ENCOUNTER — Ambulatory Visit (HOSPITAL_COMMUNITY): Payer: Medicaid Other

## 2011-01-07 ENCOUNTER — Ambulatory Visit (HOSPITAL_COMMUNITY): Payer: Medicaid Other

## 2011-01-09 ENCOUNTER — Ambulatory Visit (HOSPITAL_COMMUNITY): Payer: Medicaid Other

## 2011-01-12 ENCOUNTER — Ambulatory Visit (HOSPITAL_COMMUNITY): Payer: Medicaid Other

## 2011-01-14 ENCOUNTER — Ambulatory Visit (HOSPITAL_COMMUNITY): Payer: Medicaid Other

## 2011-01-16 ENCOUNTER — Ambulatory Visit (HOSPITAL_COMMUNITY): Payer: Medicaid Other

## 2011-01-19 ENCOUNTER — Ambulatory Visit (HOSPITAL_COMMUNITY): Payer: Medicaid Other

## 2011-01-21 ENCOUNTER — Ambulatory Visit (HOSPITAL_COMMUNITY): Payer: Medicaid Other

## 2011-01-23 ENCOUNTER — Ambulatory Visit (HOSPITAL_COMMUNITY): Payer: Medicaid Other

## 2011-01-26 ENCOUNTER — Ambulatory Visit (HOSPITAL_COMMUNITY): Payer: Medicaid Other

## 2011-01-28 ENCOUNTER — Ambulatory Visit (HOSPITAL_COMMUNITY): Payer: Medicaid Other

## 2011-01-30 ENCOUNTER — Ambulatory Visit (HOSPITAL_COMMUNITY): Payer: Medicaid Other

## 2011-02-02 ENCOUNTER — Ambulatory Visit (HOSPITAL_COMMUNITY): Payer: Medicaid Other

## 2011-02-04 ENCOUNTER — Ambulatory Visit (HOSPITAL_COMMUNITY): Payer: Medicaid Other

## 2011-02-06 ENCOUNTER — Ambulatory Visit (HOSPITAL_COMMUNITY): Payer: Medicaid Other

## 2011-02-09 ENCOUNTER — Ambulatory Visit (HOSPITAL_COMMUNITY): Payer: Medicaid Other

## 2011-02-11 ENCOUNTER — Ambulatory Visit (HOSPITAL_COMMUNITY): Payer: Medicaid Other

## 2011-02-13 ENCOUNTER — Ambulatory Visit (HOSPITAL_COMMUNITY): Payer: Medicaid Other

## 2011-02-16 ENCOUNTER — Ambulatory Visit (HOSPITAL_COMMUNITY): Payer: Medicaid Other

## 2011-02-18 ENCOUNTER — Ambulatory Visit (HOSPITAL_COMMUNITY): Payer: Medicaid Other

## 2011-02-20 ENCOUNTER — Ambulatory Visit (HOSPITAL_COMMUNITY): Payer: Medicaid Other

## 2011-02-23 ENCOUNTER — Ambulatory Visit (HOSPITAL_COMMUNITY): Payer: Medicaid Other

## 2011-02-25 ENCOUNTER — Ambulatory Visit: Payer: Medicaid Other | Admitting: Cardiology

## 2011-02-25 ENCOUNTER — Ambulatory Visit (HOSPITAL_COMMUNITY): Payer: Medicaid Other

## 2011-02-26 ENCOUNTER — Telehealth: Payer: Self-pay | Admitting: Cardiology

## 2011-02-26 NOTE — Telephone Encounter (Signed)
Called stating he had some palpitations today while working out. Feels fine now. Is concerned about heart. Advised he missed app yesterday. States his mother told him but after the time to be here. Gave him an app to see Lawson Fiscal on Monday.

## 2011-02-26 NOTE — Telephone Encounter (Signed)
Pt said he was working out and was having palpatations please call

## 2011-02-27 ENCOUNTER — Ambulatory Visit (HOSPITAL_COMMUNITY): Payer: Medicaid Other

## 2011-03-02 ENCOUNTER — Encounter: Payer: Self-pay | Admitting: Nurse Practitioner

## 2011-03-02 ENCOUNTER — Ambulatory Visit (INDEPENDENT_AMBULATORY_CARE_PROVIDER_SITE_OTHER): Payer: Medicaid Other | Admitting: Nurse Practitioner

## 2011-03-02 ENCOUNTER — Ambulatory Visit (HOSPITAL_COMMUNITY): Payer: Medicaid Other

## 2011-03-02 VITALS — BP 108/76 | HR 76 | Ht 75.0 in | Wt 274.2 lb

## 2011-03-02 DIAGNOSIS — R002 Palpitations: Secondary | ICD-10-CM | POA: Insufficient documentation

## 2011-03-02 DIAGNOSIS — I2510 Atherosclerotic heart disease of native coronary artery without angina pectoris: Secondary | ICD-10-CM

## 2011-03-02 DIAGNOSIS — I10 Essential (primary) hypertension: Secondary | ICD-10-CM

## 2011-03-02 NOTE — Assessment & Plan Note (Signed)
He is doing well from this standpoint. I did tell him to stop his Effient when his current bottle is finished. He has a BMS in place. No history of MI. I will have him see Dr. [place]iland in 3 months. Patient is agreeable to this plan and will call if any problems develop in the interim.

## 2011-03-02 NOTE — Patient Instructions (Addendum)
You are having some "extra beats". This can be from too much caffeine or alcohol. They are not dangerous for you. I would suggest you cut back on these two things. If the skips continue or get worse, let me know. We will need to put a monitor on.  You may stop your Effient when your current supply runs out.  We will see you back in about 3 months. Call for any problems.

## 2011-03-02 NOTE — Assessment & Plan Note (Signed)
Blood pressure looks good. No change in other medicines.

## 2011-03-02 NOTE — Assessment & Plan Note (Signed)
He has PAC's on his EKG. I explained to him that these were not dangerous but could be a nuisance. He is not really symptomatic at this time. I encouraged him to cut back the alcohol and caffeine. If symptoms persist, we could place a monitor. Will tentatively see him in 3 months.

## 2011-03-02 NOTE — Progress Notes (Signed)
[name] [name] Date of Birth: 1976/03/11   History of Present Illness: [name] [name] is seen back today for a follow up visit. He is seen for Dr. [place]. He missed his recent follow up appointment with Dr. [place]iland. He has been doing well. He had a BMS placed to the LAD in May of 2012. He is on Effient. Cost is not a factor. No chest pain or chest burning. He has had one spell of some palpitations that occurred with working out at the gym last week. It scared him. It was just for a few seconds. He was not symptomatic but did get concerned. He is not smoking. No marijuana use. He continues to drink and admits probably to too much, especially prior to this episode of palpitations. He does not watch his caffeine. He has not been dizzy or lightheaded. He has had some left shoulder pain intermittently but has had a broken left collar bone in the past. He has no exertional symptoms whatsoever. He is taking his medicines. Blood sugars are good. Blood pressure has been good.   Current Outpatient Prescriptions on File Prior to Visit  Medication Sig Dispense Refill  . aspirin 81 MG tablet Take 81 mg by mouth daily.        [name] lisinopril (PRINIVIL,ZESTRIL) 40 MG tablet Take 1 tablet (40 mg total) by mouth daily.  30 tablet  5  . metFORMIN (GLUCOPHAGE) 500 MG tablet Take 500 mg by mouth daily with breakfast.        . prasugrel (EFFIENT) 10 MG TABS Take 1 tablet (10 mg total) by mouth daily.  30 tablet  5  . QUEtiapine Fumarate (SEROQUEL PO) Take by mouth daily.        . rosuvastatin (CRESTOR) 20 MG tablet Take 20 mg by mouth daily.        [name] triamterene-hydrochlorothiazide (MAXZIDE-25) 37.5-25 MG per tablet Take 1 tablet by mouth. Not taking with the lisinopril.        Allergies  Allergen Reactions  . Penicillins     Past Medical History  Diagnosis Date  . Hypertension   . Diabetes mellitus   . Hypercholesterolemia   . ADHD (attention deficit hyperactivity disorder)   . CAD (coronary artery disease)    S/P cath May 2012 with BMS to the LAD following abnormal stress test  . Obesity   . Drug abuse     history of marijuana use and alcohol    Past Surgical History  Procedure Date  . Abscess drainage     right arm; approx 2003  . Coronary stent placement May 2012    LAD    History  Smoking status  . Former Smoker  . Types: Cigarettes  . Quit date: 09/30/2009  Smokeless tobacco  . Not on file    History  Alcohol Use  . 6.0 oz/week  . 7 Cans of beer, 3 Shots of liquor per week    Family History  Problem Relation Age of Onset  . Hypertension Mother   . Diabetes Mother   . Hypertension Father   . Diabetes Father   . Heart disease Father     cabg 3; 15  . Hypertension Sister     Review of Systems: The review of systems is positive for recent spell of palpitations.  All other systems were reviewed and are negative.  Physical Exam: BP 108/76  Pulse 76  Ht 6\' 3"  (1.905 m)  Wt 274 lb 3.2 oz (124.376 kg)  BMI 34.27  kg/m2 Patient is very pleasant and in no acute distress. He is rather large. Has several tattoos. Skin is warm and dry. Color is normal.  HEENT is unremarkable. Normocephalic/atraumatic. PERRL. Sclera are nonicteric. Neck is supple. No masses. No JVD. Lungs are clear. Cardiac exam shows a regular rate and rhythm. He has frequent ectopics.  Abdomen is obese but soft. Extremities are without edema. Gait and ROM are intact. No gross neurologic deficits noted.  LABORATORY DATA: EKG shows sinus with PAC's today. 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 PR is 210 ms  Assessment / Plan:

## 2011-03-04 ENCOUNTER — Ambulatory Visit (HOSPITAL_COMMUNITY): Payer: Medicaid Other

## 2011-03-06 ENCOUNTER — Encounter: Payer: Self-pay | Admitting: Cardiology

## 2011-03-06 ENCOUNTER — Ambulatory Visit (HOSPITAL_COMMUNITY): Payer: Medicaid Other

## 2011-03-09 ENCOUNTER — Ambulatory Visit (HOSPITAL_COMMUNITY): Payer: Medicaid Other

## 2011-03-11 ENCOUNTER — Ambulatory Visit (HOSPITAL_COMMUNITY): Payer: Medicaid Other

## 2011-03-13 ENCOUNTER — Ambulatory Visit (HOSPITAL_COMMUNITY): Payer: Medicaid Other

## 2011-05-08 ENCOUNTER — Other Ambulatory Visit: Payer: Self-pay | Admitting: *Deleted

## 2011-05-08 MED ORDER — ATORVASTATIN CALCIUM 20 MG PO TABS: 20.0000 mg | ORAL_TABLET | Freq: Every day | ORAL | Status: DC

## 2011-05-08 NOTE — Telephone Encounter (Signed)
Received fax from [facility] stating they needed prior auth for Crestor. Called [phone] and spoke w/Tony. States will need to fail 2 other medications before approved. Lipitor was one they approved. Will change to Lipitor 20 mg. Sent [facility] to [facility]. Lm for pt that we were changing his Crestor to Lipitor.

## 2011-05-25 ENCOUNTER — Other Ambulatory Visit: Payer: Self-pay | Admitting: *Deleted

## 2011-05-27 ENCOUNTER — Ambulatory Visit (INDEPENDENT_AMBULATORY_CARE_PROVIDER_SITE_OTHER): Payer: Medicaid Other | Admitting: Cardiology

## 2011-05-27 ENCOUNTER — Encounter: Payer: Self-pay | Admitting: Cardiology

## 2011-05-27 ENCOUNTER — Other Ambulatory Visit (INDEPENDENT_AMBULATORY_CARE_PROVIDER_SITE_OTHER): Payer: Medicaid Other | Admitting: *Deleted

## 2011-05-27 VITALS — BP 122/84 | HR 74 | Ht 75.0 in | Wt 279.8 lb

## 2011-05-27 DIAGNOSIS — E7800 Pure hypercholesterolemia, unspecified: Secondary | ICD-10-CM

## 2011-05-27 DIAGNOSIS — I2510 Atherosclerotic heart disease of native coronary artery without angina pectoris: Secondary | ICD-10-CM

## 2011-05-27 DIAGNOSIS — I10 Essential (primary) hypertension: Secondary | ICD-10-CM

## 2011-05-27 DIAGNOSIS — E785 Hyperlipidemia, unspecified: Secondary | ICD-10-CM

## 2011-05-27 DIAGNOSIS — E119 Type 2 diabetes mellitus without complications: Secondary | ICD-10-CM

## 2011-05-27 LAB — HEPATIC FUNCTION PANEL
ALT: 49 U/L (ref 0–53)
AST: 26 U/L (ref 0–37)
Albumin: 4.6 g/dL (ref 3.5–5.2)
Alkaline Phosphatase: 53 U/L (ref 39–117)
Bilirubin, Direct: 0 mg/dL (ref 0.0–0.3)
Total Protein: 8.1 g/dL (ref 6.0–8.3)

## 2011-05-27 LAB — BASIC METABOLIC PANEL
CO2: 26 mEq/L (ref 19–32)
Chloride: 104 mEq/L (ref 96–112)
Glucose, Bld: 101 mg/dL — ABNORMAL HIGH (ref 70–99)
Potassium: 4.4 mEq/L (ref 3.5–5.1)
Sodium: 138 mEq/L (ref 135–145)

## 2011-05-27 LAB — LIPID PANEL: HDL: 53.2 mg/dL (ref >39.00)

## 2011-05-27 NOTE — Progress Notes (Signed)
[name] [name] Date of Birth: [birth year]   History of Present Illness: [name] [name] is seen back today for a follow up visit.  He has been doing well. He had a BMS placed to the LAD in May of 2012. No chest pain or chest burning. He denies any current palpitations. He does have occasional soreness in his left shoulder but this is different than his anginal symptoms. He is not smoking. No marijuana use. He continues to drink moderately.He does not watch his caffeine. He has not been dizzy or lightheaded. He has had some left shoulder pain intermittently but has had a broken left collar bone in the past. He has no exertional symptoms whatsoever. He is taking his medicines. Blood sugars are good. Blood pressure has been good.   Current Outpatient Prescriptions on File Prior to Visit  Medication Sig Dispense Refill  . aspirin 81 MG tablet Take 81 mg by mouth daily.        [name] atorvastatin (LIPITOR) 20 MG tablet Take 1 tablet (20 mg total) by mouth daily.  30 tablet  11  . lisinopril (PRINIVIL,ZESTRIL) 40 MG tablet Take 1 tablet (40 mg total) by mouth daily.  30 tablet  5  . metFORMIN (GLUCOPHAGE) 500 MG tablet Take 500 mg by mouth daily with breakfast.        . QUEtiapine Fumarate (SEROQUEL PO) Take by mouth daily.        [name] triamterene-hydrochlorothiazide (MAXZIDE-25) 37.5-25 MG per tablet Take 1 tablet by mouth. Not taking with the lisinopril.        Allergies  Allergen Reactions  . Penicillins     Past Medical History  Diagnosis Date  . Hypertension   . Diabetes mellitus   . Hypercholesterolemia   . ADHD (attention deficit hyperactivity disorder)   . CAD (coronary artery disease)     S/P cath May 2012 with BMS to the LAD following abnormal stress test  . Obesity   . Drug abuse     history of marijuana use and alcohol    Past Surgical History  Procedure Date  . Abscess drainage     right arm; approx 2003  . Coronary stent placement May 2012    LAD    History  Smoking status  .  Former Smoker  . Types: Cigarettes  . Quit date: 09/30/2009  Smokeless tobacco  . Not on file    History  Alcohol Use  . 6.0 oz/week  . 7 Cans of beer, 3 Shots of liquor per week    Family History  Problem Relation Age of Onset  . Hypertension Mother   . Diabetes Mother   . Hypertension Father   . Diabetes Father   . Heart disease Father     cabg 3; 83  . Hypertension Sister     Review of Systems: As noted in history of present illness.  All other systems were reviewed and are negative.  Physical Exam: BP 122/84  Pulse 74  Ht 6\' 3"  (1.905 m)  Wt 126.916 kg (279 lb 12.8 oz)  BMI 34.97 kg/m2 Patient is very pleasant and in no acute distress. He is rather large. Has several tattoos. Skin is warm and dry. Color is normal.  HEENT is unremarkable. Normocephalic/atraumatic. PERRL. Sclera are nonicteric. Neck is supple. No masses. No JVD. Lungs are clear. Cardiac exam shows a regular rate and rhythm. He has frequent ectopics.  Abdomen is obese but soft. Extremities are without edema. Gait and ROM are intact. No  gross neurologic deficits noted.  LABORATORY DATA: EKG shows sinus with PAC's today. 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 PR is 210 ms  Assessment / Plan:

## 2011-05-27 NOTE — Patient Instructions (Signed)
Continue your current medications.  We will call with the results of your blood work today.  I will see you again in 6 months.  You may resume weight lifting along with your aerobic exercise.  Reduce your alcohol intake.

## 2011-05-27 NOTE — Assessment & Plan Note (Signed)
We'll followup on his fasting lab work today. I stressed the importance of lifestyle modification given his advanced coronary disease at a young age.

## 2011-05-27 NOTE — Assessment & Plan Note (Signed)
He is not having any significant anginal symptoms. Continue to focus on risk factor modification. He is cleared to resume some weightlifting. I recommended he moderate his alcohol use. He needs to reduce his caffeine intake. I'll followup again in 6 months.

## 2011-05-27 NOTE — Assessment & Plan Note (Signed)
Blood pressure control is acceptable. 

## 2011-05-28 ENCOUNTER — Other Ambulatory Visit: Payer: Medicaid Other | Admitting: *Deleted

## 2011-06-03 ENCOUNTER — Telehealth: Payer: Self-pay | Admitting: *Deleted

## 2011-06-03 DIAGNOSIS — E785 Hyperlipidemia, unspecified: Secondary | ICD-10-CM

## 2011-06-03 MED ORDER — ATORVASTATIN CALCIUM 40 MG PO TABS: 40.0000 mg | ORAL_TABLET | Freq: Every day | ORAL | Status: DC

## 2011-06-03 NOTE — Telephone Encounter (Signed)
Message copied by Lorayne Bender on Wed Jun 03, 2011  1:51 PM ------      Message from: [place], PETER M      Created: Wed May 27, 2011  9:38 PM       Chemistries look very good including glucose. LDL has increased. Goal LDL is 70. Recommend increasing Lipitor to 40 mg daily and repeat HFP and lipids in 3 months.      Theron Arista [place]

## 2011-06-03 NOTE — Telephone Encounter (Signed)
Notified of lab results. Will increase Lipitor to 40 mg daily. Will send new Rx to [facility]. Advised will need to get fasting labs in 3 mo-put on recall schedule.

## 2011-06-08 ENCOUNTER — Ambulatory Visit: Payer: Medicaid Other | Admitting: *Deleted

## 2011-06-09 ENCOUNTER — Encounter: Payer: Self-pay | Admitting: *Deleted

## 2011-06-22 ENCOUNTER — Other Ambulatory Visit: Payer: Self-pay | Admitting: *Deleted

## 2011-06-22 MED ORDER — LISINOPRIL 40 MG PO TABS: 40.0000 mg | ORAL_TABLET | Freq: Every day | ORAL | Status: DC

## 2011-10-22 ENCOUNTER — Other Ambulatory Visit: Payer: Self-pay | Admitting: Cardiology

## 2011-10-22 MED ORDER — TRIAMTERENE-HCTZ 37.5-25 MG PO TABS: 1.0000 | ORAL_TABLET | Freq: Every day | ORAL | Status: DC

## 2011-10-23 DIAGNOSIS — M549 Dorsalgia, unspecified: Secondary | ICD-10-CM | POA: Insufficient documentation

## 2011-11-23 ENCOUNTER — Encounter: Payer: Self-pay | Admitting: Cardiology

## 2011-11-23 ENCOUNTER — Ambulatory Visit (INDEPENDENT_AMBULATORY_CARE_PROVIDER_SITE_OTHER): Payer: Medicaid Other | Admitting: Cardiology

## 2011-11-23 VITALS — BP 118/79 | HR 71 | Ht 75.0 in | Wt 281.0 lb

## 2011-11-23 DIAGNOSIS — I10 Essential (primary) hypertension: Secondary | ICD-10-CM

## 2011-11-23 DIAGNOSIS — E785 Hyperlipidemia, unspecified: Secondary | ICD-10-CM

## 2011-11-23 DIAGNOSIS — I2510 Atherosclerotic heart disease of native coronary artery without angina pectoris: Secondary | ICD-10-CM

## 2011-11-23 DIAGNOSIS — E119 Type 2 diabetes mellitus without complications: Secondary | ICD-10-CM

## 2011-11-23 LAB — LIPID PANEL
Cholesterol: 150 mg/dL (ref 0–200)
Triglycerides: 200 mg/dL — ABNORMAL HIGH (ref 0.0–149.0)

## 2011-11-23 LAB — BASIC METABOLIC PANEL
BUN: 15 mg/dL (ref 6–23)
Calcium: 9.4 mg/dL (ref 8.4–10.5)
Creatinine, Ser: 1 mg/dL (ref 0.4–1.5)

## 2011-11-23 LAB — HEMOGLOBIN A1C: Hgb A1c MFr Bld: 6.6 % — ABNORMAL HIGH (ref 4.6–6.5)

## 2011-11-23 LAB — HEPATIC FUNCTION PANEL
AST: 22 U/L (ref 0–37)
Albumin: 4.1 g/dL (ref 3.5–5.2)
Alkaline Phosphatase: 50 U/L (ref 39–117)
Bilirubin, Direct: 0 mg/dL (ref 0.0–0.3)
Total Protein: 7.6 g/dL (ref 6.0–8.3)

## 2011-11-23 NOTE — Progress Notes (Signed)
    [name] [name] Date of Birth: 11/25/1975   History of Present Illness: [name] [name] is seen back today for a follow up visit.   He had a BMS placed to the LAD in May of 2012. He reports he continues to do well without any symptoms of chest pain or shortness of breath. He is walking. He is not smoking cigarettes but is smoking marijuana. He feels that he is under a lot of stress. He thinks that his diabetes is under good control. He has no other new complaints.  Current Outpatient Prescriptions on File Prior to Visit  Medication Sig Dispense Refill  . aspirin 81 MG tablet Take 81 mg by mouth daily.        [name] atorvastatin (LIPITOR) 40 MG tablet Take 1 tablet (40 mg total) by mouth daily.  30 tablet  11  . lisinopril (PRINIVIL,ZESTRIL) 40 MG tablet Take 1 tablet (40 mg total) by mouth daily.  30 tablet  5  . metFORMIN (GLUCOPHAGE) 500 MG tablet Take 500 mg by mouth daily with breakfast.        . QUEtiapine Fumarate (SEROQUEL PO) Take by mouth daily.        [name] triamterene-hydrochlorothiazide (MAXZIDE-25) 37.5-25 MG per tablet Take 1 each (1 tablet total) by mouth daily. Not taking with the lisinopril.  30 tablet  5    Allergies  Allergen Reactions  . Penicillins     Past Medical History  Diagnosis Date  . Hypertension   . Diabetes mellitus   . Hypercholesterolemia   . ADHD (attention deficit hyperactivity disorder)   . CAD (coronary artery disease)     S/P cath May 2012 with BMS to the LAD following abnormal stress test  . Obesity   . Drug abuse     history of marijuana use and alcohol    Past Surgical History  Procedure Date  . Abscess drainage     right arm; approx 2003  . Coronary stent placement May 2012    LAD    History  Smoking status  . Former Smoker  . Types: Cigarettes  . Quit date: 09/30/2009  Smokeless tobacco  . Not on file    History  Alcohol Use  . 6.0 oz/week  . 7 Cans of beer, 3 Shots of liquor per week    Family History  Problem Relation Age of  Onset  . Hypertension Mother   . Diabetes Mother   . Hypertension Father   . Diabetes Father   . Heart disease Father     cabg 3; 81  . Hypertension Sister     Review of Systems: As noted in history of present illness.  All other systems were reviewed and are negative.  Physical Exam: BP 118/79  Pulse 71  Ht 6\' 3"  (1.905 m)  Wt 127.461 kg (281 lb)  BMI 35.12 kg/m2 Patient is very pleasant and in no acute distress. Has several tattoos. Skin is warm and dry. Color is normal.  HEENT is unremarkable. Normocephalic/atraumatic. PERRL. Sclera are nonicteric. Neck is supple. No masses. No JVD. Lungs are clear. Cardiac exam shows a regular rate and rhythm. There are no gallops, murmurs, or click. Abdomen is obese but soft. Extremities are without edema. Gait and ROM are intact. No gross neurologic deficits noted.  LABORATORY DATA:   Assessment / Plan:

## 2011-11-23 NOTE — Assessment & Plan Note (Signed)
His LDL was 107 on his last visit. Lipitor dose was increased. We will recheck fasting lab work today

## 2011-11-23 NOTE — Assessment & Plan Note (Signed)
He remains asymptomatic. He is no longer on Plavix. We will continue aspirin 81 mg daily. Continue risk factor modification.

## 2011-11-23 NOTE — Patient Instructions (Signed)
We will check fasting lab work today and call with the results.   Continue your same medication  I will see you again in 6 months.

## 2011-11-23 NOTE — Assessment & Plan Note (Signed)
Blood pressure is in excellent control on Maxzide and lisinopril. We'll check electrolytes and renal function today.

## 2011-12-20 ENCOUNTER — Other Ambulatory Visit: Payer: Self-pay | Admitting: Cardiology

## 2011-12-22 ENCOUNTER — Ambulatory Visit: Payer: Medicaid Other | Attending: Family Medicine | Admitting: Physical Therapy

## 2012-06-21 ENCOUNTER — Other Ambulatory Visit: Payer: Self-pay | Admitting: *Deleted

## 2012-06-21 MED ORDER — LISINOPRIL 40 MG PO TABS: 40.0000 mg | ORAL_TABLET | Freq: Every day | ORAL | Status: DC

## 2012-08-19 ENCOUNTER — Other Ambulatory Visit: Payer: Self-pay | Admitting: Cardiology

## 2012-09-18 ENCOUNTER — Other Ambulatory Visit: Payer: Self-pay | Admitting: Cardiology

## 2012-10-20 ENCOUNTER — Other Ambulatory Visit: Payer: Self-pay | Admitting: Cardiology

## 2012-10-23 ENCOUNTER — Other Ambulatory Visit: Payer: Self-pay | Admitting: Cardiology

## 2012-12-19 ENCOUNTER — Other Ambulatory Visit: Payer: Self-pay | Admitting: Cardiology

## 2013-01-19 ENCOUNTER — Other Ambulatory Visit: Payer: Self-pay | Admitting: Cardiology

## 2013-04-08 ENCOUNTER — Emergency Department (HOSPITAL_COMMUNITY): Payer: Medicaid Other

## 2013-04-08 ENCOUNTER — Emergency Department (HOSPITAL_COMMUNITY)
Admission: EM | Admit: 2013-04-08 | Discharge: 2013-04-08 | Disposition: A | Discharge status: Home / Self Care | Payer: Medicaid Other | Attending: Emergency Medicine | Admitting: Emergency Medicine

## 2013-04-08 ENCOUNTER — Encounter: Payer: Self-pay | Admitting: Physician Assistant

## 2013-04-08 ENCOUNTER — Other Ambulatory Visit: Payer: Self-pay | Admitting: Physician Assistant

## 2013-04-08 ENCOUNTER — Encounter (HOSPITAL_COMMUNITY): Payer: Self-pay | Admitting: Emergency Medicine

## 2013-04-08 DIAGNOSIS — F1021 Alcohol dependence, in remission: Secondary | ICD-10-CM | POA: Insufficient documentation

## 2013-04-08 DIAGNOSIS — R079 Chest pain, unspecified: Secondary | ICD-10-CM

## 2013-04-08 DIAGNOSIS — E119 Type 2 diabetes mellitus without complications: Secondary | ICD-10-CM | POA: Insufficient documentation

## 2013-04-08 DIAGNOSIS — R51 Headache: Secondary | ICD-10-CM | POA: Insufficient documentation

## 2013-04-08 DIAGNOSIS — Z880 Allergy status to penicillin: Secondary | ICD-10-CM | POA: Insufficient documentation

## 2013-04-08 DIAGNOSIS — I2510 Atherosclerotic heart disease of native coronary artery without angina pectoris: Secondary | ICD-10-CM | POA: Insufficient documentation

## 2013-04-08 DIAGNOSIS — Z79899 Other long term (current) drug therapy: Secondary | ICD-10-CM | POA: Insufficient documentation

## 2013-04-08 DIAGNOSIS — Z9861 Coronary angioplasty status: Secondary | ICD-10-CM | POA: Insufficient documentation

## 2013-04-08 DIAGNOSIS — I10 Essential (primary) hypertension: Secondary | ICD-10-CM | POA: Insufficient documentation

## 2013-04-08 DIAGNOSIS — E669 Obesity, unspecified: Secondary | ICD-10-CM | POA: Insufficient documentation

## 2013-04-08 DIAGNOSIS — E7800 Pure hypercholesterolemia, unspecified: Secondary | ICD-10-CM | POA: Insufficient documentation

## 2013-04-08 DIAGNOSIS — Z8659 Personal history of other mental and behavioral disorders: Secondary | ICD-10-CM | POA: Insufficient documentation

## 2013-04-08 DIAGNOSIS — Z87891 Personal history of nicotine dependence: Secondary | ICD-10-CM | POA: Insufficient documentation

## 2013-04-08 DIAGNOSIS — Z7982 Long term (current) use of aspirin: Secondary | ICD-10-CM | POA: Insufficient documentation

## 2013-04-08 LAB — BASIC METABOLIC PANEL
BUN: 9 mg/dL (ref 6–23)
CO2: 21 mEq/L (ref 19–32)
Calcium: 9.4 mg/dL (ref 8.4–10.5)
Creatinine, Ser: 0.84 mg/dL (ref 0.50–1.35)
GFR calc Af Amer: >90 mL/min (ref >90)
Glucose, Bld: 118 mg/dL — ABNORMAL HIGH (ref 70–99)
Sodium: 140 mEq/L (ref 135–145)

## 2013-04-08 LAB — CBC WITH DIFFERENTIAL/PLATELET
Basophils Relative: 0 % (ref 0–1)
Eosinophils Absolute: 0 10*3/uL (ref 0.0–0.7)
Eosinophils Relative: 0 % (ref 0–5)
Hemoglobin: 13.4 g/dL (ref 13.0–17.0)
Lymphs Abs: 1.9 10*3/uL (ref 0.7–4.0)
MCH: 30.5 pg (ref 26.0–34.0)
MCHC: 33.8 g/dL (ref 30.0–36.0)
MCV: 90.2 fL (ref 78.0–100.0)
Monocytes Relative: 5 % (ref 3–12)
Neutro Abs: 2.9 10*3/uL (ref 1.7–7.7)
Platelets: 265 10*3/uL (ref 150–400)
RBC: 4.4 MIL/uL (ref 4.22–5.81)

## 2013-04-08 MED ORDER — ASPIRIN 81 MG PO CHEW
324.0000 mg | CHEWABLE_TABLET | Freq: Once | ORAL | Status: AC
  Administered 2013-04-08: 324 mg via ORAL
  Filled 2013-04-08: qty 4

## 2013-04-08 MED ORDER — DIPHENHYDRAMINE HCL 50 MG/ML IJ SOLN
50.0000 mg | Freq: Once | INTRAMUSCULAR | Status: AC
  Administered 2013-04-08: 50 mg via INTRAVENOUS
  Filled 2013-04-08: qty 1

## 2013-04-08 MED ORDER — ONDANSETRON 4 MG PO TBDP: ORAL_TABLET | ORAL | Status: DC

## 2013-04-08 MED ORDER — METOCLOPRAMIDE HCL 5 MG/ML IJ SOLN
10.0000 mg | Freq: Once | INTRAMUSCULAR | Status: AC
  Administered 2013-04-08: 10 mg via INTRAVENOUS
  Filled 2013-04-08: qty 2

## 2013-04-08 MED ORDER — SODIUM CHLORIDE 0.9 % IV BOLUS (SEPSIS)
1000.0000 mL | Freq: Once | INTRAVENOUS | Status: AC
  Administered 2013-04-08: 1000 mL via INTRAVENOUS

## 2013-04-08 MED ORDER — MORPHINE SULFATE 4 MG/ML IJ SOLN
6.0000 mg | Freq: Once | INTRAMUSCULAR | Status: AC
  Administered 2013-04-08: 6 mg via INTRAVENOUS
  Filled 2013-04-08: qty 2

## 2013-04-08 MED ORDER — KETOROLAC TROMETHAMINE 30 MG/ML IJ SOLN
30.0000 mg | Freq: Once | INTRAMUSCULAR | Status: AC
  Administered 2013-04-08: 30 mg via INTRAVENOUS
  Filled 2013-04-08: qty 1

## 2013-04-08 NOTE — ED Notes (Signed)
Per EMS report, patient started having L chest pain x 1 hour ago.   Patient has history of stent placement x 2 yrs.  Patient stated headache x 2 days.   Upon EMS report 8/10 dull pain in L chest that radiates to L neck.   Patient had 2 NTG SL on the way here that brought pain down to 5/10.    Patient had 4 zofran .   18 L AC placed by EMS.

## 2013-04-08 NOTE — Progress Notes (Signed)
Patient ID: [name] [name], male   DOB: 22-Jan-1976, 37 y.o.   MRN: [id number] Asked by Dr. Tenny Craw on weekend to arrange f/u myoview and clinic appointment with Dr. [place] for atypical chest pain. I have left a message on our office's scheduling voicemail requesting these, and our office will call the patient with these appointments. Dayna Dunn PA-C

## 2013-04-08 NOTE — ED Provider Notes (Signed)
CSN: [id number]     Arrival date & time 04/08/13  1019 History   First MD Initiated Contact with Patient 04/08/13 1020     Chief Complaint  Patient presents with  . Chest Pain  . Headache   (Consider location/radiation/quality/duration/timing/severity/associated sxs/prior Treatment) HPI Comments: 37 yo male with cardiac stent hx, lipids, htn, cholesterol presents with ha and chest ache.  Pt had chest ache left sided from 7 until 8 am today, clarified with pt, constant, mild neck radiation, no sob/ diaphoresis or exertional component.  Pt has had intermittent HA frontal for a month, gradual onset, and worsening.  No injuries or known CNS issues. No camping, sick contacts or neck stiffness.  Past smoker. Not worse in am.   Patient is a 37 y.o. male presenting with chest pain and headaches. The history is provided by the patient.  Chest Pain Associated symptoms: headache   Associated symptoms: no abdominal pain, no back pain, no fever, no numbness, no shortness of breath, not vomiting and no weakness   Headache Associated symptoms: neck pain   Associated symptoms: no abdominal pain, no back pain, no congestion, no fever, no neck stiffness, no numbness and no vomiting     Past Medical History  Diagnosis Date  . Hypertension   . Diabetes mellitus   . Hypercholesterolemia   . ADHD (attention deficit hyperactivity disorder)   . CAD (coronary artery disease)     S/P cath May 2012 with BMS to the LAD following abnormal stress test  . Obesity   . Drug abuse     history of marijuana use and alcohol   Past Surgical History  Procedure Laterality Date  . Abscess drainage      right arm; approx 2003  . Coronary stent placement  May 2012    LAD   Family History  Problem Relation Age of Onset  . Hypertension Mother   . Diabetes Mother   . Hypertension Father   . Diabetes Father   . Heart disease Father     cabg 3; 21  . Hypertension Sister    History  Substance Use Topics  .  Smoking status: Former Smoker    Types: Cigarettes    Quit date: 09/30/2009  . Smokeless tobacco: Not on file  . Alcohol Use: 6.0 oz/week    7 Cans of beer, 3 Shots of liquor per week    Review of Systems  Constitutional: Positive for appetite change. Negative for fever and chills.  HENT: Negative for congestion.   Eyes: Negative for visual disturbance.  Respiratory: Negative for shortness of breath.   Cardiovascular: Positive for chest pain.  Gastrointestinal: Negative for vomiting and abdominal pain.  Genitourinary: Negative for dysuria and flank pain.  Musculoskeletal: Positive for neck pain. Negative for back pain and neck stiffness.  Skin: Negative for rash.  Neurological: Positive for headaches. Negative for weakness, light-headedness and numbness.    Allergies  Penicillins  Home Medications   Current Outpatient Rx  Name  Route  Sig  Dispense  Refill  . aspirin 81 MG tablet   Oral   Take 81 mg by mouth daily.          [name] atorvastatin (LIPITOR) 20 MG tablet   Oral   Take 20 mg by mouth daily.         . carisoprodol (SOMA) 350 MG tablet   Oral   Take 350 mg by mouth 4 (four) times daily as needed for muscle spasms.         [name]  lisinopril (PRINIVIL,ZESTRIL) 40 MG tablet   Oral   Take 40 mg by mouth daily.         . metFORMIN (GLUCOPHAGE) 500 MG tablet   Oral   Take 500 mg by mouth daily with breakfast.         . EXPIRED: atorvastatin (LIPITOR) 40 MG tablet   Oral   Take 1 tablet (40 mg total) by mouth daily.   30 tablet   11    BP 115/75  Pulse 62  Temp(Src) 98.1 F (36.7 C) (Oral)  Resp 18  SpO2 97% Physical Exam  Nursing note and vitals reviewed. Constitutional: He is oriented to person, place, and time. He appears well-developed and well-nourished.  HENT:  Head: Normocephalic and atraumatic.  Eyes: Conjunctivae are normal. Right eye exhibits no discharge. Left eye exhibits no discharge.  Neck: Normal range of motion. Neck supple. No  tracheal deviation present.  Cardiovascular: Normal rate, regular rhythm and intact distal pulses.   Pulmonary/Chest: Effort normal and breath sounds normal.  Abdominal: Soft. He exhibits no distension. There is no tenderness. There is no guarding.  Musculoskeletal: He exhibits no edema.  Neurological: He is alert and oriented to person, place, and time.  Skin: Skin is warm. No rash noted.  Psychiatric: He has a normal mood and affect.    ED Course  Procedures (including critical care time) Labs Review Labs Reviewed  BASIC METABOLIC PANEL - Abnormal; Notable for the following:    Glucose, Bld 118 (*)    All other components within normal limits  CBC WITH DIFFERENTIAL  TROPONIN I  TROPONIN I  TROPONIN I   Imaging Review Dg Chest 2 View  04/08/2013   CLINICAL DATA:  Chest pain radiating to the head and neck.  EXAM: CHEST  2 VIEW  COMPARISON:  10/18/2010.  FINDINGS: The heart size and mediastinal contours are within normal limits. Both lungs are clear. The visualized skeletal structures are unremarkable.  IMPRESSION: No active cardiopulmonary disease.   Electronically Signed   By: Andreas Newport M.D.   On: 04/08/2013 11:10   Ct Head Wo Contrast  04/08/2013   CLINICAL DATA:  Chest pain. Headache.  EXAM: CT HEAD WITHOUT CONTRAST  TECHNIQUE: Contiguous axial images were obtained from the base of the skull through the vertex without contrast.  COMPARISON:  None  FINDINGS: Normal appearance of the intracranial structures. No evidence for acute hemorrhage, mass lesion, midline shift, hydrocephalus or large infarct. No acute bony abnormality. The visualized sinuses are clear.  IMPRESSION: Negative exam.   Electronically Signed   By: Davonna Belling M.D.   On: 04/08/2013 12:13    EKG Interpretation     Ventricular Rate:  75 PR Interval:  198 QRS Duration: 110 QT Interval:  410 QTC Calculation: 458 R Axis:   -39 Text Interpretation:  Sinus rhythm Incomplete left bundle branch block No acute  findings          Repeat EKG no acute findings.  Date: 04/08/2013  Rate: 62  Rhythm: normal sinus rhythm  QRS Axis: left  Intervals: normal  ST/T Wave abnormalities: nonspecific ST changes  Conduction Disutrbances:none  Narrative Interpretation:   Old EKG Reviewed: unchanged   MDM  No diagnosis found. Chest pain, known CAD, no acute findings.  No cp in ED.  Plan for delta troponin and re-eval. CP does not occur with HA. HA non specific, no signs of meningitis, no concern for [facility].  Pain meds and CT head. Improved in ED.  CT no acute findings, reviewed.  Repeat EKG no acute findings. HA resolved in ED. No cp since 830 am.   Discussed with cardiology and reviewed troponin/ ekgs, recommended close outpt fup for stress test, they will Arrange.  Strict reasons to return discussed with pt/ family, any cp pt will come back ASA in ED. Pt on daily.   Results and differential diagnosis were discussed with the patient. Close follow up outpatient was discussed, patient comfortable with the plan.   Diagnosis: Chest pain, CAD, Headache  Enid Skeens, MD 04/08/13 1807

## 2013-04-26 ENCOUNTER — Encounter (HOSPITAL_COMMUNITY): Payer: Medicaid Other

## 2013-04-28 ENCOUNTER — Encounter: Payer: Medicaid Other | Admitting: Nurse Practitioner

## 2013-05-08 ENCOUNTER — Encounter (HOSPITAL_COMMUNITY): Payer: Medicaid Other

## 2013-06-02 ENCOUNTER — Encounter: Payer: Self-pay | Admitting: Nurse Practitioner

## 2017-04-20 ENCOUNTER — Other Ambulatory Visit: Payer: Self-pay

## 2017-04-20 ENCOUNTER — Encounter: Payer: Self-pay | Admitting: Emergency Medicine

## 2017-04-20 ENCOUNTER — Emergency Department
Admission: EM | Admit: 2017-04-20 | Discharge: 2017-04-20 | Disposition: A | Discharge status: Home / Self Care | Payer: Medicaid Other | Attending: Emergency Medicine | Admitting: Emergency Medicine

## 2017-04-20 DIAGNOSIS — M5416 Radiculopathy, lumbar region: Secondary | ICD-10-CM | POA: Insufficient documentation

## 2017-04-20 DIAGNOSIS — Y939 Activity, unspecified: Secondary | ICD-10-CM | POA: Diagnosis not present

## 2017-04-20 DIAGNOSIS — Z7984 Long term (current) use of oral hypoglycemic drugs: Secondary | ICD-10-CM | POA: Insufficient documentation

## 2017-04-20 DIAGNOSIS — X500XXA Overexertion from strenuous movement or load, initial encounter: Secondary | ICD-10-CM | POA: Diagnosis not present

## 2017-04-20 DIAGNOSIS — I10 Essential (primary) hypertension: Secondary | ICD-10-CM | POA: Diagnosis not present

## 2017-04-20 DIAGNOSIS — Y999 Unspecified external cause status: Secondary | ICD-10-CM | POA: Diagnosis not present

## 2017-04-20 DIAGNOSIS — Z7982 Long term (current) use of aspirin: Secondary | ICD-10-CM | POA: Insufficient documentation

## 2017-04-20 DIAGNOSIS — I2510 Atherosclerotic heart disease of native coronary artery without angina pectoris: Secondary | ICD-10-CM | POA: Diagnosis not present

## 2017-04-20 DIAGNOSIS — Z79899 Other long term (current) drug therapy: Secondary | ICD-10-CM | POA: Diagnosis not present

## 2017-04-20 DIAGNOSIS — S39012A Strain of muscle, fascia and tendon of lower back, initial encounter: Secondary | ICD-10-CM | POA: Insufficient documentation

## 2017-04-20 DIAGNOSIS — Z87891 Personal history of nicotine dependence: Secondary | ICD-10-CM | POA: Insufficient documentation

## 2017-04-20 DIAGNOSIS — E119 Type 2 diabetes mellitus without complications: Secondary | ICD-10-CM | POA: Insufficient documentation

## 2017-04-20 DIAGNOSIS — S3992XA Unspecified injury of lower back, initial encounter: Secondary | ICD-10-CM | POA: Diagnosis present

## 2017-04-20 DIAGNOSIS — Y929 Unspecified place or not applicable: Secondary | ICD-10-CM | POA: Diagnosis not present

## 2017-04-20 MED ORDER — ORPHENADRINE CITRATE 30 MG/ML IJ SOLN
60.0000 mg | Freq: Once | INTRAMUSCULAR | Status: AC
  Administered 2017-04-20: 60 mg via INTRAMUSCULAR
  Filled 2017-04-20: qty 2

## 2017-04-20 MED ORDER — MELOXICAM 15 MG PO TABS: 15.0000 mg | ORAL_TABLET | Freq: Every day | ORAL | 0 refills | Status: DC

## 2017-04-20 MED ORDER — KETOROLAC TROMETHAMINE 30 MG/ML IJ SOLN
30.0000 mg | Freq: Once | INTRAMUSCULAR | Status: AC
  Administered 2017-04-20: 30 mg via INTRAMUSCULAR
  Filled 2017-04-20: qty 1

## 2017-04-20 MED ORDER — METHOCARBAMOL 500 MG PO TABS: 500.0000 mg | ORAL_TABLET | Freq: Four times a day (QID) | ORAL | 0 refills | Status: DC

## 2017-04-20 NOTE — ED Provider Notes (Signed)
[facility] Emergency Department Provider Note  ____________________________________________  Time seen: Approximately 4:23 PM  I have reviewed the triage vital signs and the nursing notes.   HISTORY  Chief Complaint Back Pain    HPI [name] [name] is a 41 y.o. male who presents the emergency department complaining of right-sided lower back pain with radiation to his right leg.  Patient reports that he was moving some stuff around yesterday when he felt lower back pain.  Patient reports that the pain is on the right side.  No bowel or bladder function, saddle anesthesia, paresthesias.  No direct trauma to the back.  No urinary symptoms.  Patient tried ibuprofen yesterday.  No other medications prior to arrival.  No other complaints at this time.  Patient has a history of coronary artery disease, diabetes, hypertension.  Patient has no complaints with chronic medical problems.  Patient denies any headache, visual changes, chest pain, shortness of breath, abdominal pain, urinary symptoms, constipation or diarrhea.  Past Medical History:  Diagnosis Date  . ADHD (attention deficit hyperactivity disorder)   . CAD (coronary artery disease)    S/P cath May 2012 with BMS to the LAD following abnormal stress test  . Diabetes mellitus   . Drug abuse ([place])    history of marijuana use and alcohol  . Hypercholesterolemia   . Hypertension   . Obesity     Patient Active Problem List   Diagnosis Date Noted  . Heart palpitations 03/02/2011  . CAD (coronary artery disease) 10/29/2010  . Diabetes mellitus 10/01/2010  . Hypertension   . Heartburn   . Chest pain   . Hypercholesterolemia     Past Surgical History:  Procedure Laterality Date  . ABSCESS DRAINAGE     right arm; approx 2003  . CORONARY STENT PLACEMENT  May 2012   LAD    Prior to Admission medications   Medication Sig Start Date End Date Taking? Authorizing Provider  aspirin 81 MG tablet Take 81 mg  by mouth daily.     [provider]  atorvastatin (LIPITOR) 20 MG tablet Take 20 mg by mouth daily.    [provider]  atorvastatin (LIPITOR) 40 MG tablet Take 1 tablet (40 mg total) by mouth daily. 06/03/11 06/02/12  Martinique, Peter M, MD  carisoprodol (SOMA) 350 MG tablet Take 350 mg by mouth 4 (four) times daily as needed for muscle spasms.    [provider]  lisinopril (PRINIVIL,ZESTRIL) 40 MG tablet Take 40 mg by mouth daily.    [provider]  meloxicam (MOBIC) 15 MG tablet Take 1 tablet (15 mg total) daily by mouth. 04/20/17   , Charline Bills, PA-C  metFORMIN (GLUCOPHAGE) 500 MG tablet Take 500 mg by mouth daily with breakfast.    [provider]  methocarbamol (ROBAXIN) 500 MG tablet Take 1 tablet (500 mg total) 4 (four) times daily by mouth. 04/20/17   , Charline Bills, PA-C  ondansetron (ZOFRAN ODT) 4 MG disintegrating tablet 4mg  ODT q4 hours prn nausea/vomit 04/08/13   Elnora Morrison, MD    Allergies Penicillins  Family History  Problem Relation Age of Onset  . Hypertension Mother   . Diabetes Mother   . Hypertension Father   . Diabetes Father   . Heart disease Father        cabg 3; 19  . Hypertension Sister     Social History Social History   Tobacco Use  . Smoking status: Former Smoker    Types:  Cigarettes    Last attempt to quit: 09/30/2009    Years since quitting: 7.5  . Smokeless tobacco: Never Used  Substance Use Topics  . Alcohol use: Yes    Alcohol/week: 6.0 oz    Types: 7 Cans of beer, 3 Shots of liquor per week  . Drug use: No     Review of Systems  Constitutional: No fever/chills Eyes: No visual changes. Cardiovascular: no chest pain. Respiratory: no cough. No SOB. Gastrointestinal: No abdominal pain.  No nausea, no vomiting.  No diarrhea.  No constipation. Genitourinary: Negative for dysuria. No hematuria Musculoskeletal: Positive for R side lower back pain with radiation into R leg Skin:  Negative for rash, abrasions, lacerations, ecchymosis. Neurological: Negative for headaches, focal weakness or numbness. 10-point ROS otherwise negative.  ____________________________________________   PHYSICAL EXAM:  VITAL SIGNS: ED Triage Vitals  Enc Vitals Group     BP 04/20/17 1612 133/82     Pulse Rate 04/20/17 1612 74     Resp 04/20/17 1612 16     Temp 04/20/17 1612 97.7 F (36.5 C)     Temp Source 04/20/17 1612 Oral     SpO2 04/20/17 1612 100 %     Weight 04/20/17 1611 280 lb (127 kg)     Height 04/20/17 1611 6\' 3"  (1.905 m)     Head Circumference --      Peak Flow --      Pain Score 04/20/17 1610 8     Pain Loc --      Pain Edu? --      Excl. in [place]? --      Constitutional: Alert and oriented. Well appearing and in no acute distress. Eyes: Conjunctivae are normal. PERRL. EOMI. Head: Atraumatic. Neck: No stridor.    Cardiovascular: Normal rate, regular rhythm. Normal S1 and S2.  Good peripheral circulation. Respiratory: Normal respiratory effort without tachypnea or retractions. Lungs CTAB. Good air entry to the bases with no decreased or absent breath sounds. Gastrointestinal: Bowel sounds 4 quadrants. Soft and nontender to palpation. No guarding or rigidity. No palpable masses. No distention. No CVA tenderness. Musculoskeletal: Full range of motion to all extremities. No gross deformities appreciated. No deformity on inspection.  Full range of motion to the lumbar spine.  No tenderness to palpation over spinal processes.  No palpable abnormality or step-off.  Diffusely tender to palpation right-sided paraspinal muscle group.  Tenderness to palpation of the right sided sciatic notch.  Negative straight leg raise bilaterally.  Dorsalis pedis pulse and sensation intact bilateral lower extremities. Neurologic:  Normal speech and language. No gross focal neurologic deficits are appreciated.  Skin:  Skin is warm, dry and intact. No rash noted. Psychiatric: Mood and affect  are normal. Speech and behavior are normal. Patient exhibits appropriate insight and judgement.   ____________________________________________   LABS (all labs ordered are listed, but only abnormal results are displayed)  Labs Reviewed - No data to display ____________________________________________  EKG   ____________________________________________  RADIOLOGY   No results found.  ____________________________________________    PROCEDURES  Procedure(s) performed:    Procedures    Medications  ketorolac (TORADOL) 30 MG/ML injection 30 mg (not administered)  orphenadrine (NORFLEX) injection 60 mg (not administered)     ____________________________________________   INITIAL IMPRESSION / ASSESSMENT AND PLAN / ED COURSE  Pertinent labs & imaging results that were available during my care of the patient were reviewed by me and considered in my medical decision making (see chart for details).  Review of the [place] CSRS was performed in accordance of the [place] prior to dispensing any controlled drugs.     Patient's diagnosis is consistent with lumbar strain with mild lumbar radiculopathy.  No concerning findings on exam.  History was reassuring fracture versus strain versus pyelonephritis versus sciatica.  She was given Toradol and Norflex injections in the emergency department.  Patient will be discharged home with prescriptions for meloxicam and Robaxin. Patient is to follow up with primary care as needed or otherwise directed. Patient is given ED precautions to return to the ED for any worsening or new symptoms.     ____________________________________________  FINAL CLINICAL IMPRESSION(S) / ED DIAGNOSES  Final diagnoses:  Strain of lumbar region, initial encounter  Lumbar radiculopathy      NEW MEDICATIONS STARTED DURING THIS VISIT:  ED Discharge Orders        Ordered    meloxicam (MOBIC) 15 MG tablet  Daily     04/20/17 1646    methocarbamol  (ROBAXIN) 500 MG tablet  4 times daily     04/20/17 1646          This chart was dictated using voice recognition software/Dragon. Despite best efforts to proofread, errors can occur which can change the meaning. Any change was purely unintentional.    Darletta Moll, PA-C 04/20/17 1647    Clearnce Hasten Randall An, MD 04/20/17 [phone]

## 2017-04-20 NOTE — ED Triage Notes (Signed)
C/O low back pain x 1 day.  Denies injury, but states he was "Moving some stuff yesterday and it just hit me".  Took some ibuprofen yesterday.  States pain across lower back and radiating down right leg.

## 2017-05-02 ENCOUNTER — Emergency Department
Admission: EM | Admit: 2017-05-02 | Discharge: 2017-05-02 | Disposition: A | Discharge status: Home / Self Care | Payer: Medicaid Other | Attending: Emergency Medicine | Admitting: Emergency Medicine

## 2017-05-02 ENCOUNTER — Emergency Department: Payer: Medicaid Other

## 2017-05-02 ENCOUNTER — Other Ambulatory Visit: Payer: Self-pay

## 2017-05-02 DIAGNOSIS — I2510 Atherosclerotic heart disease of native coronary artery without angina pectoris: Secondary | ICD-10-CM | POA: Insufficient documentation

## 2017-05-02 DIAGNOSIS — E119 Type 2 diabetes mellitus without complications: Secondary | ICD-10-CM | POA: Diagnosis not present

## 2017-05-02 DIAGNOSIS — I10 Essential (primary) hypertension: Secondary | ICD-10-CM | POA: Insufficient documentation

## 2017-05-02 DIAGNOSIS — M4726 Other spondylosis with radiculopathy, lumbar region: Secondary | ICD-10-CM | POA: Insufficient documentation

## 2017-05-02 DIAGNOSIS — Z79899 Other long term (current) drug therapy: Secondary | ICD-10-CM | POA: Insufficient documentation

## 2017-05-02 DIAGNOSIS — M545 Low back pain: Secondary | ICD-10-CM | POA: Insufficient documentation

## 2017-05-02 DIAGNOSIS — Z87891 Personal history of nicotine dependence: Secondary | ICD-10-CM | POA: Diagnosis not present

## 2017-05-02 DIAGNOSIS — Z7982 Long term (current) use of aspirin: Secondary | ICD-10-CM | POA: Diagnosis not present

## 2017-05-02 DIAGNOSIS — Z7984 Long term (current) use of oral hypoglycemic drugs: Secondary | ICD-10-CM | POA: Diagnosis not present

## 2017-05-02 DIAGNOSIS — G8929 Other chronic pain: Secondary | ICD-10-CM

## 2017-05-02 MED ORDER — BACLOFEN 10 MG PO TABS: 10.0000 mg | ORAL_TABLET | Freq: Three times a day (TID) | ORAL | 0 refills | Status: DC

## 2017-05-02 MED ORDER — KETOROLAC TROMETHAMINE 30 MG/ML IJ SOLN
30.0000 mg | Freq: Once | INTRAMUSCULAR | Status: AC
  Administered 2017-05-02: 30 mg via INTRAMUSCULAR
  Filled 2017-05-02: qty 1

## 2017-05-02 MED ORDER — ORPHENADRINE CITRATE 30 MG/ML IJ SOLN
60.0000 mg | Freq: Two times a day (BID) | INTRAMUSCULAR | Status: DC
  Administered 2017-05-02: 60 mg via INTRAMUSCULAR
  Filled 2017-05-02: qty 2

## 2017-05-02 MED ORDER — OXYCODONE-ACETAMINOPHEN 5-325 MG PO TABS
1.0000 | ORAL_TABLET | Freq: Once | ORAL | Status: AC
  Administered 2017-05-02: 1 via ORAL
  Filled 2017-05-02: qty 1

## 2017-05-02 MED ORDER — KETOROLAC TROMETHAMINE 10 MG PO TABS: 10.0000 mg | ORAL_TABLET | Freq: Four times a day (QID) | ORAL | 0 refills | Status: DC | PRN
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
## 2017-05-02 NOTE — ED Notes (Signed)
NAD noted at time of D/C. Pt denies questions or concerns. Pt  Taken to the lobby via wheelchair at this time.

## 2017-05-02 NOTE — ED Notes (Signed)
Pt lying on his stomach in the treatment room - reports 10/10 back pain  Spouse reports that at some time he cannot walk or stand

## 2017-07-14 ENCOUNTER — Emergency Department
Admission: EM | Admit: 2017-07-14 | Discharge: 2017-07-14 | Disposition: A | Discharge status: Home / Self Care | Payer: Medicaid Other | Attending: Emergency Medicine | Admitting: Emergency Medicine

## 2017-07-14 ENCOUNTER — Emergency Department: Payer: Medicaid Other

## 2017-07-14 ENCOUNTER — Encounter: Payer: Self-pay | Admitting: Emergency Medicine

## 2017-07-14 DIAGNOSIS — M5412 Radiculopathy, cervical region: Secondary | ICD-10-CM | POA: Insufficient documentation

## 2017-07-14 DIAGNOSIS — I259 Chronic ischemic heart disease, unspecified: Secondary | ICD-10-CM | POA: Diagnosis not present

## 2017-07-14 DIAGNOSIS — E119 Type 2 diabetes mellitus without complications: Secondary | ICD-10-CM | POA: Diagnosis not present

## 2017-07-14 DIAGNOSIS — Z87891 Personal history of nicotine dependence: Secondary | ICD-10-CM | POA: Insufficient documentation

## 2017-07-14 DIAGNOSIS — I10 Essential (primary) hypertension: Secondary | ICD-10-CM | POA: Diagnosis not present

## 2017-07-14 DIAGNOSIS — M79602 Pain in left arm: Secondary | ICD-10-CM | POA: Diagnosis present

## 2017-07-14 DIAGNOSIS — Z7984 Long term (current) use of oral hypoglycemic drugs: Secondary | ICD-10-CM | POA: Diagnosis not present

## 2017-07-14 DIAGNOSIS — Z79899 Other long term (current) drug therapy: Secondary | ICD-10-CM | POA: Diagnosis not present

## 2017-07-14 DIAGNOSIS — Z7982 Long term (current) use of aspirin: Secondary | ICD-10-CM | POA: Insufficient documentation

## 2017-07-14 LAB — BASIC METABOLIC PANEL
ANION GAP: 13 (ref 5–15)
BUN: 10 mg/dL (ref 6–20)
CALCIUM: 8.9 mg/dL (ref 8.9–10.3)
CO2: 19 mmol/L — ABNORMAL LOW (ref 22–32)
CREATININE: 0.93 mg/dL (ref 0.61–1.24)
Chloride: 105 mmol/L (ref 101–111)
Glucose, Bld: 170 mg/dL — ABNORMAL HIGH (ref 65–99)
Potassium: 3.6 mmol/L (ref 3.5–5.1)
SODIUM: 137 mmol/L (ref 135–145)

## 2017-07-14 LAB — TROPONIN I

## 2017-07-14 LAB — CBC
HCT: 41.8 % (ref 40.0–52.0)
HEMOGLOBIN: 14 g/dL (ref 13.0–18.0)
MCH: 31.4 pg (ref 26.0–34.0)
MCHC: 33.6 g/dL (ref 32.0–36.0)
MCV: 93.4 fL (ref 80.0–100.0)
PLATELETS: 245 10*3/uL (ref 150–440)
RBC: 4.47 MIL/uL (ref 4.40–5.90)
RDW: 14.4 % (ref 11.5–14.5)
WBC: 6.2 10*3/uL (ref 3.8–10.6)

## 2017-07-14 MED ORDER — DEXAMETHASONE SODIUM PHOSPHATE 10 MG/ML IJ SOLN
10.0000 mg | Freq: Once | INTRAMUSCULAR | Status: AC
  Administered 2017-07-14: 10 mg via INTRAMUSCULAR
  Filled 2017-07-14: qty 1

## 2017-07-14 MED ORDER — HYDROCODONE-ACETAMINOPHEN 5-325 MG PO TABS
1.0000 | ORAL_TABLET | ORAL | Status: AC
  Administered 2017-07-14: 1 via ORAL
  Filled 2017-07-14: qty 1

## 2017-07-14 MED ORDER — HYDROCODONE-ACETAMINOPHEN 5-325 MG PO TABS: 1.0000 | ORAL_TABLET | ORAL | 0 refills | Status: DC | PRN

## 2017-07-14 MED ORDER — PREDNISONE 10 MG PO TABS: 10.0000 mg | ORAL_TABLET | Freq: Every day | ORAL | 0 refills | Status: DC

## 2017-07-14 MED ORDER — CYCLOBENZAPRINE HCL 5 MG PO TABS: 5.0000 mg | ORAL_TABLET | Freq: Three times a day (TID) | ORAL | 0 refills | Status: DC | PRN

## 2017-07-14 MED ORDER — KETOROLAC TROMETHAMINE 30 MG/ML IJ SOLN
30.0000 mg | Freq: Once | INTRAMUSCULAR | Status: AC
Start: 2017-07-14 — End: 2017-07-14
  Administered 2017-07-14: 30 mg via INTRAMUSCULAR
  Filled 2017-07-14: qty 1

## 2017-07-14 NOTE — Discharge Instructions (Addendum)
Please take medications as prescribed and return to the emergency department for any increasing pain worsening symptoms or urgent changes in your health.  If no improvement 1 week follow-up with orthopedics.  Please continue to monitor blood pressure.  If any chest pain headache or vision changes return to the ED.  Please take lisinopril as prescribed.

## 2017-07-14 NOTE — ED Triage Notes (Signed)
Pt comes into the ED via POV c/o left arm pain that started yesterday.  Patient also has been having GERD problems and has a h/o cardiac stents.  Denies any injury to the arm.  Patient states the pain also radiated into his back yesterday evening.  Patient has even and unlabored respirations at this time and in NAD. Patient states the pain is a shooting pain of pins and needles down the arm.

## 2017-07-14 NOTE — ED Notes (Signed)
Pt says yesterday pt felt a sharp pain in the back of his left shoulder, and the pain was shooting in the arm. Now the left arm aches today. Pt denies sob, nausea or vomitting. Lungs are clear. No distress noted.

## 2017-07-14 NOTE — ED Provider Notes (Signed)
[facility] EMERGENCY DEPARTMENT Provider Note   CSN: [id number] Arrival date & time: 07/14/17  1800     History   Chief Complaint Chief Complaint  Patient presents with  . Arm Pain    HPI [name] [name] is a 42 y.o. male presents to the emergency department for evaluation of left arm pain.  Patient has had left arm pain times 2 days.  He describes burning numbness and tingling from the left side of his neck into the left superior scapular border down to the biceps and radial aspect of the left forearm.  His symptoms are constant.  He states symptoms are increased with cervical extension and he gets relief with cervical flexion.  He denies any trauma or injury.  No weakness in the upper or lower extremities.  Patient's pain is 7 out of 10.  He is not taking any medications for his symptoms.  He has a history of cardiac disease had a stent placed in 2013.  Denies any chest pain, nausea, vomiting or diaphoresis.  Patient currently on aspirin.  HPI  Past Medical History:  Diagnosis Date  . ADHD (attention deficit hyperactivity disorder)   . CAD (coronary artery disease)    S/P cath May 2012 with BMS to the LAD following abnormal stress test  . Diabetes mellitus   . Drug abuse ([place])    history of marijuana use and alcohol  . Hypercholesterolemia   . Hypertension   . Obesity     Patient Active Problem List   Diagnosis Date Noted  . Heart palpitations 03/02/2011  . CAD (coronary artery disease) 10/29/2010  . Diabetes mellitus 10/01/2010  . Hypertension   . Heartburn   . Chest pain   . Hypercholesterolemia     Past Surgical History:  Procedure Laterality Date  . ABSCESS DRAINAGE     right arm; approx 2003  . CORONARY STENT PLACEMENT  May 2012   LAD       Home Medications    Prior to Admission medications   Medication Sig Start Date End Date Taking? Authorizing Provider  aspirin 81 MG tablet Take 81 mg by mouth daily.     [provider]  atorvastatin (LIPITOR) 20 MG tablet Take 20 mg by mouth daily.    [provider]  atorvastatin (LIPITOR) 40 MG tablet Take 1 tablet (40 mg total) by mouth daily. 06/03/11 06/02/12  Martinique, Peter M, MD  baclofen (LIORESAL) 10 MG tablet Take 1 tablet (10 mg total) by mouth 3 (three) times daily. 05/02/17 05/02/18  Laban Emperor, PA-C  carisoprodol (SOMA) 350 MG tablet Take 350 mg by mouth 4 (four) times daily as needed for muscle spasms.    [provider]  cyclobenzaprine (FLEXERIL) 5 MG tablet Take 1-2 tablets (5-10 mg total) by mouth 3 (three) times daily as needed for muscle spasms. 07/14/17   Duanne Guess, PA-C  HYDROcodone-acetaminophen (NORCO) 5-325 MG tablet Take 1 tablet by mouth every 4 (four) hours as needed for moderate pain. 07/14/17   Duanne Guess, PA-C  ketorolac (TORADOL) 10 MG tablet Take 1 tablet (10 mg total) by mouth every 6 (six) hours as needed. 05/02/17   Laban Emperor, PA-C  lisinopril (PRINIVIL,ZESTRIL) 40 MG tablet Take 40 mg by mouth daily.    [provider]  meloxicam (MOBIC) 15 MG tablet Take 1 tablet (15 mg total) daily by mouth. 04/20/17   Cuthriell, Charline Bills, PA-C  metFORMIN (GLUCOPHAGE) 500 MG tablet Take 500 mg  by mouth daily with breakfast.    [provider]  methocarbamol (ROBAXIN) 500 MG tablet Take 1 tablet (500 mg total) 4 (four) times daily by mouth. 04/20/17   Cuthriell, Charline Bills, PA-C  ondansetron (ZOFRAN ODT) 4 MG disintegrating tablet 4mg  ODT q4 hours prn nausea/vomit 04/08/13   Elnora Morrison, MD  predniSONE (DELTASONE) 10 MG tablet Take 1 tablet (10 mg total) by mouth daily. 6,5,4,3,2,1 six day taper 07/14/17   Duanne Guess, PA-C    Family History Family History  Problem Relation Age of Onset  . Hypertension Mother   . Diabetes Mother   . Hypertension Father   . Diabetes Father   . Heart disease Father        cabg 3; 48  . Hypertension Sister     Social History Social History   Tobacco  Use  . Smoking status: Former Smoker    Types: Cigarettes    Last attempt to quit: 09/30/2009    Years since quitting: 7.7  . Smokeless tobacco: Never Used  Substance Use Topics  . Alcohol use: Yes    Alcohol/week: 6.0 oz    Types: 7 Cans of beer, 3 Shots of liquor per week  . Drug use: No     Allergies   Penicillins   Review of Systems Review of Systems  Constitutional: Negative for fever.  Respiratory: Negative for shortness of breath.   Cardiovascular: Negative for chest pain.  Gastrointestinal: Negative for abdominal pain, nausea and vomiting.  Genitourinary: Negative for difficulty urinating, dysuria and urgency.  Musculoskeletal: Positive for neck pain. Negative for back pain and myalgias.  Skin: Negative for rash.  Neurological: Positive for numbness. Negative for dizziness and headaches.     Physical Exam Updated Vital Signs BP (!) 141/96 (BP Location: Right Arm)   Pulse 83   Temp 98.5 F (36.9 C) (Oral)   Resp 18   Ht 6\' 3"  (1.905 m)   Wt 128.8 kg (284 lb)   SpO2 98%   BMI 35.50 kg/m   Physical Exam  Constitutional: He is oriented to person, place, and time. He appears well-developed and well-nourished.  HENT:  Head: Normocephalic and atraumatic.  Eyes: Conjunctivae are normal.  Neck: Normal range of motion.  Cardiovascular: Normal rate, regular rhythm and normal heart sounds.  Pulmonary/Chest: Effort normal. No stridor. No respiratory distress. He has no wheezes.  Abdominal: Soft. He exhibits no distension. There is no tenderness. There is no guarding.  Musculoskeletal:  Cervical Spine: Examination of the cervical spine reveals no bony abnormality, no edema, and no ecchymosis.  There is no step-off.  The patient has full active and passive range of motion of the cervical spine with flexion, extension, and right and left bend with rotation.  There is no crepitus with range of motion exercises.  The patient is non-tender along the spinous process to  palpation.  The patient has no paravertebral muscle spasm.  Mild tenderness along the left superior scapular border.  The patient has a negative axial compression test.  Patient has a positive Spurling's test to the left.    Bilateral upper extremities: Examination of the left and right shoulder and arm showed no bony abnormality or edema.  The patient has normal active and passive motion with abduction, flexion, internal rotation, and external rotation.  The patient has no tenderness with motion.  The patient has a negative Hawkins test and a negative impingement test.  The patient has a negative drop arm test.  The  patient is non-tender along the deltoid muscle.  There is no subacromial space tenderness with no AC joint tenderness.  The patient has no instability of the shoulder with anterior-posterior motion.  There is a negative sulcus sign.  The rotator cuff muscle strength is 5/5 with supraspinatus, 5/5 with internal rotation, and 5/5 with external rotation.  There is no crepitus with range of motion activities.  Slight decrease in sensation along the left biceps region on the left when compared to the right.   Neurological: He is alert and oriented to person, place, and time.  Skin: Skin is warm. No rash noted.  Psychiatric: He has a normal mood and affect. His behavior is normal. Thought content normal.     ED Treatments / Results  Labs (all labs ordered are listed, but only abnormal results are displayed) Labs Reviewed  BASIC METABOLIC PANEL - Abnormal; Notable for the following components:      Result Value   CO2 19 (*)    Glucose, Bld 170 (*)    All other components within normal limits  CBC  TROPONIN I    EKG  EKG Interpretation None       Radiology Dg Chest 2 View  Result Date: 07/14/2017 CLINICAL DATA:  LEFT arm pain began yesterday. EXAM: CHEST  2 VIEW COMPARISON:  None. FINDINGS: The heart size and mediastinal contours are within normal limits. Both lungs are clear.  The visualized skeletal structures are unremarkable. IMPRESSION: No active cardiopulmonary disease. Electronically Signed   By: Staci Righter M.D.   On: 07/14/2017 19:09    Procedures Procedures (including critical care time)  Medications Ordered in ED Medications  dexamethasone (DECADRON) injection 10 mg (not administered)  ketorolac (TORADOL) 30 MG/ML injection 30 mg (not administered)  HYDROcodone-acetaminophen (NORCO/VICODIN) 5-325 MG per tablet 1 tablet (not administered)     Initial Impression / Assessment and Plan / ED Course  I have reviewed the triage vital signs and the nursing notes.  Pertinent labs & imaging results that were available during my care of the patient were reviewed by me and considered in my medical decision making (see chart for details).     42 year old male with left-sided neck pain with radiating pain numbness and tingling down his left arm in a C6 nerve distribution.  No significant weakness but patient with slight decrease in sensation on the left upper arm on the left when compared to the right.  CBC, BMP, troponins are all within normal limits.  EKG and chest x-ray reviewed by me indicate no acute cardiopulmonary disease.  Patient given anti-inflammatory medications, saw improvement of his radicular pain.  He was encouraged to take prednisone, Norco and cyclobenzaprine as needed over the next few days.  Follow-up with orthopedics if no improvement.  He is educated on signs and symptoms return to the ED for.  Patient also noted to have hypertension.  Denies any headache, chest pain, shortness of breath, vision changes.  He admits to not taking his lisinopril as prescribed.  Recommend he continue with this medication, states he has this medication at home.  He also has a blood pressure cuff at home that he agrees to continue to monitor his blood pressure.  If any headache, chest pain, shortness of breath, vision changes, elevated blood pressure patient will  return to the ED for further evaluation.  Final Clinical Impressions(s) / ED Diagnoses   Final diagnoses:  Left cervical radiculopathy    ED Discharge Orders  Ordered    cyclobenzaprine (FLEXERIL) 5 MG tablet  3 times daily PRN     07/14/17 2045    HYDROcodone-acetaminophen (NORCO) 5-325 MG tablet  Every 4 hours PRN     07/14/17 2045    predniSONE (DELTASONE) 10 MG tablet  Daily     07/14/17 2045       Duanne Guess, PA-C 07/14/17 2047    Duanne Guess, PA-C 07/14/17 2106    Nena Polio, MD 07/14/17 2351

## 2017-07-14 NOTE — ED Triage Notes (Signed)
FIRST NURSE NOTE-here for left arm pain. Cardiac pt. Pulled next for ekg

## 2017-07-14 NOTE — ED Triage Notes (Signed)
Waiting for open room with EKG machine to do EKG. Tech aware of needs.

## 2017-09-17 ENCOUNTER — Ambulatory Visit: Payer: Medicaid Other | Admitting: Cardiovascular Disease

## 2017-12-05 ENCOUNTER — Other Ambulatory Visit: Payer: Self-pay

## 2017-12-05 ENCOUNTER — Emergency Department: Payer: Medicaid Other

## 2017-12-05 ENCOUNTER — Emergency Department
Admission: EM | Admit: 2017-12-05 | Discharge: 2017-12-05 | Disposition: A | Discharge status: Home / Self Care | Payer: Medicaid Other | Attending: Emergency Medicine | Admitting: Emergency Medicine

## 2017-12-05 DIAGNOSIS — Z79899 Other long term (current) drug therapy: Secondary | ICD-10-CM | POA: Insufficient documentation

## 2017-12-05 DIAGNOSIS — Z7984 Long term (current) use of oral hypoglycemic drugs: Secondary | ICD-10-CM | POA: Diagnosis not present

## 2017-12-05 DIAGNOSIS — K625 Hemorrhage of anus and rectum: Secondary | ICD-10-CM

## 2017-12-05 DIAGNOSIS — Z7982 Long term (current) use of aspirin: Secondary | ICD-10-CM | POA: Diagnosis not present

## 2017-12-05 DIAGNOSIS — I2510 Atherosclerotic heart disease of native coronary artery without angina pectoris: Secondary | ICD-10-CM | POA: Insufficient documentation

## 2017-12-05 DIAGNOSIS — R1012 Left upper quadrant pain: Secondary | ICD-10-CM | POA: Insufficient documentation

## 2017-12-05 DIAGNOSIS — E119 Type 2 diabetes mellitus without complications: Secondary | ICD-10-CM | POA: Insufficient documentation

## 2017-12-05 DIAGNOSIS — I10 Essential (primary) hypertension: Secondary | ICD-10-CM | POA: Diagnosis not present

## 2017-12-05 DIAGNOSIS — Z87891 Personal history of nicotine dependence: Secondary | ICD-10-CM | POA: Diagnosis not present

## 2017-12-05 LAB — COMPREHENSIVE METABOLIC PANEL
ALBUMIN: 4.1 g/dL (ref 3.5–5.0)
ALT: 55 U/L — ABNORMAL HIGH (ref 0–44)
ANION GAP: 10 (ref 5–15)
AST: 35 U/L (ref 15–41)
Alkaline Phosphatase: 59 U/L (ref 38–126)
BILIRUBIN TOTAL: 0.5 mg/dL (ref 0.3–1.2)
BUN: 11 mg/dL (ref 6–20)
CO2: 19 mmol/L — AB (ref 22–32)
Calcium: 8.8 mg/dL — ABNORMAL LOW (ref 8.9–10.3)
Chloride: 106 mmol/L (ref 98–111)
Creatinine, Ser: 0.89 mg/dL (ref 0.61–1.24)
GFR calc Af Amer: >60 mL/min (ref >60)
GFR calc non Af Amer: >60 mL/min (ref >60)
GLUCOSE: 180 mg/dL — AB (ref 70–99)
Potassium: 3.9 mmol/L (ref 3.5–5.1)
SODIUM: 135 mmol/L (ref 135–145)
TOTAL PROTEIN: 7.5 g/dL (ref 6.5–8.1)

## 2017-12-05 LAB — CBC
HCT: 45.2 % (ref 40.0–52.0)
HEMOGLOBIN: 15.3 g/dL (ref 13.0–18.0)
MCH: 31.3 pg (ref 26.0–34.0)
MCHC: 33.8 g/dL (ref 32.0–36.0)
MCV: 92.7 fL (ref 80.0–100.0)
Platelets: 236 10*3/uL (ref 150–440)
RBC: 4.88 MIL/uL (ref 4.40–5.90)
RDW: 14.5 % (ref 11.5–14.5)
WBC: 7 10*3/uL (ref 3.8–10.6)

## 2017-12-05 LAB — TYPE AND SCREEN
ABO/RH(D): A POS
Antibody Screen: NEGATIVE

## 2017-12-05 LAB — LIPASE, BLOOD: Lipase: 32 U/L (ref 11–51)

## 2017-12-05 MED ORDER — IOPAMIDOL (ISOVUE-300) INJECTION 61%
15.0000 mL | Freq: Once | INTRAVENOUS | Status: AC
  Administered 2017-12-05: 30 mL via ORAL

## 2017-12-05 MED ORDER — IOHEXOL 350 MG/ML SOLN
100.0000 mL | Freq: Once | INTRAVENOUS | Status: AC | PRN
  Administered 2017-12-05: 100 mL via INTRAVENOUS

## 2017-12-05 MED ORDER — IOPAMIDOL (ISOVUE-300) INJECTION 61%: 15.0000 mL | INTRAVENOUS | Status: DC

## 2017-12-05 MED ORDER — PANTOPRAZOLE SODIUM 40 MG PO TBEC
40.0000 mg | DELAYED_RELEASE_TABLET | Freq: Every day | ORAL | Status: DC
  Administered 2017-12-05: 40 mg via ORAL
  Filled 2017-12-05: qty 1

## 2017-12-05 MED ORDER — GI COCKTAIL ~~LOC~~
30.0000 mL | Freq: Once | ORAL | Status: AC
  Administered 2017-12-05: 30 mL via ORAL
  Filled 2017-12-05: qty 30

## 2017-12-05 NOTE — ED Notes (Signed)
Pt rang bell, reports he finished contrast RN called CT to inform, pt denies any needs at present.

## 2017-12-05 NOTE — ED Provider Notes (Signed)
[facility] Emergency Department Provider Note   ____________________________________________   First MD Initiated Contact with Patient 12/05/17 [phone]     (approximate)  I have reviewed the triage vital signs and the nursing notes.   HISTORY  Chief Complaint Rectal Bleeding    HPI [name] [name] is a 42 y.o. male reports previous history of coronary disease with stent and diabetes  Patient reports for about 2 years now is experienced intermittent episodes of having slight amount of blood in his stool.  He also occasionally for last few months has been experiencing pain in his left upper abdomen that seems to come and go.  He reports he saw primary doctor at [facility], about a month ago and they have ordered an ultrasound to further investigate.  Reports he has been told also been treated at [facility] that he has hemorrhoids.  He relates that sometimes alcohol use including drinking a couple of beers and a few shots will seem to make the symptoms seem worse at times the last month.  He is not a daily drinker.  Denies vomiting blood, but has been burping and belching occasionally reports smells a little funny.  He is had 2 bowel movements today, reports he seeing small amounts of red blood in his stool.  He has had a moderate fairly persistent pain in the left mid abdomen sometimes shooting up and down the left side from the lower abdomen towards the upper abdomen for the last 24 hours.  No vomiting.  No fevers, but felt some chills last night  Does not take any blood thinners except a baby aspirin daily which she reports he was told he need to stand because of his heart stent.  Past Medical History:  Diagnosis Date  . ADHD (attention deficit hyperactivity disorder)   . CAD (coronary artery disease)    S/P cath May 2012 with BMS to the LAD following abnormal stress test  . Diabetes mellitus   . Drug abuse ([place])    history of marijuana use and alcohol  .  Hypercholesterolemia   . Hypertension   . Obesity     Patient Active Problem List   Diagnosis Date Noted  . Heart palpitations 03/02/2011  . CAD (coronary artery disease) 10/29/2010  . Diabetes mellitus 10/01/2010  . Hypertension   . Heartburn   . Chest pain   . Hypercholesterolemia     Past Surgical History:  Procedure Laterality Date  . ABSCESS DRAINAGE     right arm; approx 2003  . CORONARY STENT PLACEMENT  May 2012   LAD    Prior to Admission medications   Medication Sig Start Date End Date Taking? Authorizing Provider  aspirin 81 MG tablet Take 81 mg by mouth daily.     [provider]  atorvastatin (LIPITOR) 20 MG tablet Take 20 mg by mouth daily.    [provider]  atorvastatin (LIPITOR) 40 MG tablet Take 1 tablet (40 mg total) by mouth daily. 06/03/11 06/02/12  Martinique, Peter M, MD  baclofen (LIORESAL) 10 MG tablet Take 1 tablet (10 mg total) by mouth 3 (three) times daily. 05/02/17 05/02/18  Laban Emperor, PA-C  carisoprodol (SOMA) 350 MG tablet Take 350 mg by mouth 4 (four) times daily as needed for muscle spasms.    [provider]  cyclobenzaprine (FLEXERIL) 5 MG tablet Take 1-2 tablets (5-10 mg total) by mouth 3 (three) times daily as needed for muscle spasms. 07/14/17   Duanne Guess, PA-C  HYDROcodone-acetaminophen (NORCO) 5-325 MG tablet Take 1 tablet by mouth every 4 (four) hours as needed for moderate pain. 07/14/17   Duanne Guess, PA-C  ketorolac (TORADOL) 10 MG tablet Take 1 tablet (10 mg total) by mouth every 6 (six) hours as needed. 05/02/17   Laban Emperor, PA-C  lisinopril (PRINIVIL,ZESTRIL) 40 MG tablet Take 40 mg by mouth daily.    [provider]  meloxicam (MOBIC) 15 MG tablet Take 1 tablet (15 mg total) daily by mouth. 04/20/17   Cuthriell, Charline Bills, PA-C  metFORMIN (GLUCOPHAGE) 500 MG tablet Take 500 mg by mouth daily with breakfast.    [provider]  methocarbamol (ROBAXIN) 500 MG tablet Take 1  tablet (500 mg total) 4 (four) times daily by mouth. 04/20/17   Cuthriell, Charline Bills, PA-C  ondansetron (ZOFRAN ODT) 4 MG disintegrating tablet 4mg  ODT q4 hours prn nausea/vomit 04/08/13   Elnora Morrison, MD  predniSONE (DELTASONE) 10 MG tablet Take 1 tablet (10 mg total) by mouth daily. 6,5,4,3,2,1 six day taper 07/14/17   Duanne Guess, PA-C    Allergies Penicillins  Family History  Problem Relation Age of Onset  . Hypertension Mother   . Diabetes Mother   . Hypertension Father   . Diabetes Father   . Heart disease Father        cabg 3; 36  . Hypertension Sister     Social History Social History   Tobacco Use  . Smoking status: Former Smoker    Types: Cigarettes    Last attempt to quit: 09/30/2009    Years since quitting: 8.1  . Smokeless tobacco: Never Used  Substance Use Topics  . Alcohol use: Yes    Alcohol/week: 6.0 oz    Types: 7 Cans of beer, 3 Shots of liquor per week  . Drug use: No    Types: Marijuana    Review of Systems Constitutional: No fever denies fatigue Eyes: No visual changes. ENT: No sore throat. Cardiovascular: Denies chest pain. Respiratory: Denies shortness of breath. Gastrointestinal: Some slight nausea off and on.  No vomiting.  No diarrhea.  No constipation. Genitourinary: Negative for dysuria. Musculoskeletal: Negative for back pain. Skin: Negative for rash. Neurological: Negative for headaches, focal weakness or numbness.    ____________________________________________   PHYSICAL EXAM:  VITAL SIGNS: ED Triage Vitals  Enc Vitals Group     BP 12/05/17 0812 114/72     Pulse Rate 12/05/17 0812 100     Resp 12/05/17 0812 15     Temp 12/05/17 0812 98.7 F (37.1 C)     Temp Source 12/05/17 0812 Oral     SpO2 12/05/17 0812 97 %     Weight 12/05/17 0813 287 lb (130.2 kg)     Height 12/05/17 0813 6\' 3"  (1.905 m)     Head Circumference --      Peak Flow --      Pain Score 12/05/17 0828 6     Pain Loc --      Pain Edu? --       Excl. in [place]? --     Constitutional: Alert and oriented. Well appearing and in no acute distress. Eyes: Conjunctivae are normal. Head: Atraumatic. Nose: No congestion/rhinnorhea. Mouth/Throat: Mucous membranes are moist. Neck: No stridor.   Cardiovascular: Normal rate, regular rhythm. Grossly normal heart sounds.  Good peripheral circulation. Respiratory: Normal respiratory effort.  No retractions. Lungs CTAB. Gastrointestinal: Soft and nontender over the right, negative Murphy, no pain to McBurney's point.  Patient does have mild to moderate pain to palpation in the left upper quadrant left lower quadrant and left flank without rebound guarding or peritonitis. No distention. Rectal exam, no external hemorrhoids are noted.  Stool is yellow to brown and formed, but is just trace heme positive.  No frank rectal bleeding is observed.  No bright red blood is denoted. Musculoskeletal: No lower extremity tenderness nor edema. Neurologic:  Normal speech and language. No gross focal neurologic deficits are appreciated.  Skin:  Skin is warm, dry and intact. No rash noted. Psychiatric: Mood and affect are normal. Speech and behavior are normal.  ____________________________________________   LABS (all labs ordered are listed, but only abnormal results are displayed)  Labs Reviewed  COMPREHENSIVE METABOLIC PANEL - Abnormal; Notable for the following components:      Result Value   CO2 19 (*)    Glucose, Bld 180 (*)    Calcium 8.8 (*)    ALT 55 (*)    All other components within normal limits  CBC  LIPASE, BLOOD  POC OCCULT BLOOD, ED  TYPE AND SCREEN   ____________________________________________  EKG   ____________________________________________  RADIOLOGY  CT abdomen pelvis reviewed, no acute ____________________________________________   PROCEDURES  Procedure(s) performed: None  Procedures  Critical Care performed:  No  ____________________________________________   INITIAL IMPRESSION / ASSESSMENT AND PLAN / ED COURSE  Pertinent labs & imaging results that were available during my care of the patient were reviewed by me and considered in my medical decision making (see chart for details).  Differential diagnosis includes but is not limited to, abdominal perforation, aortic dissection, cholecystitis, appendicitis, diverticulitis, colitis, esophagitis/gastritis, kidney stone, pyelonephritis, urinary tract infection, aortic aneurysm. All are considered in decision and treatment plan. Based upon the patient's presentation and risk factors, I am moderately concerned the patient may have some type of peptic ulcer disease, gastritis, or potentially mass, tumor, etc. which could account for his intermittent bleeding.  He does report left-sided abdominal pain, seems worsened to be associated with drinking and possible gastritis is considered also hemorrhoids or other possibility though none are observed externally.  Because of the left-sided component, steadiness and somewhat worsening symptoms have ordered a CT scan to evaluate further for signs of inflammation, diverticulitis etc.  He is hemodynamically stable.  Discussed with the patient and his wife, recommended he have close follow-up with gastroenterology, also recommend he start taking a over-the-counter acid blocker such as Nexium or Protonix.  ----------------------------------------- 1:45 PM on 12/05/2017 -----------------------------------------  CT abdomen pelvis negative for acute.  Patient resting comfortably, reports symptoms improved after GI cocktail.  Discussed with patient we will have him follow-up closely with primary doctor and gastroenterology.  Return precautions discussed with patient and wife are both in agreement.  No evidence of ongoing bleeding, no ongoing rectal bleeding since being in the ER.  Suspect potentially hemorrhoids versus  peptic ulcer disease versus other gastritis as etiology, somewhat unclear but appears stable for outpatient work-up.  Return precautions and treatment recommendations and follow-up discussed with the patient who is agreeable with the plan.        ____________________________________________   FINAL CLINICAL IMPRESSION(S) / ED DIAGNOSES  Final diagnoses:  Rectal bleeding      NEW MEDICATIONS STARTED DURING THIS VISIT:  Discharge Medication List as of 12/05/2017  1:07 PM       Note:  This document was prepared using Dragon voice recognition software and may include unintentional dictation errors.     Delman Kitten,  MD 12/05/17 1346

## 2017-12-05 NOTE — Discharge Instructions (Addendum)
You were seen in the emergency room for abdominal pain. It is important that you follow up closely with your primary care doctor and with gastroenterology as soon as possible.  Please start an over the counter acid reducing medication such as omeprazole or pantoprazole (please see instructions on packaging for dosing). Stop alcohol and do not take ibuprofen or NSAIDs, except continue your daily baby aspirin.   Please return to the emergency room right away if you are to develop a fever, severe nausea, your pain becomes severe or worsens, you are unable to keep food down, begin vomiting any dark or bloody fluid, you develop any dark or bloody stools, feel dehydrated, or other new concerns or symptoms arise.

## 2017-12-05 NOTE — ED Triage Notes (Signed)
Pt arrives to ED c/o of rectal bleeding, L rib cage pain and abd pain. States bleeding began yesterday, states little clots, states bright red. Also c/o nausea. Alert, oriented. Hx hemorrhoids. Wife states when he burps it smells like blood. Wife states US scheduled end of July but states symptoms have progressed. Symptoms have actually been going on for a while, pt initially stated it began yesterday. Takes ASA but denies blood thinner.

## 2017-12-05 NOTE — ED Notes (Addendum)
First Nurse Note: Pt to ED c/o rectal bleeding for "a while". Worse the past few weeks. Pt appears uncomfortable at this time.

## 2017-12-05 NOTE — ED Notes (Signed)
Pt presents to ED via POV with c/o intermittent rectal bleeding x 1 year. Pt states for the last week when he has a bowel movement he notes large of amount of blood and clots in his stool. Pt c/o LUQ abdominal pain with palpation. Also states over the last few days when he has had to have a bowel movement, shooting pain to R side of his back. Pt is alert and oriented, skin warm, dry, and intact.

## 2017-12-06 ENCOUNTER — Emergency Department
Admission: EM | Admit: 2017-12-06 | Discharge: 2017-12-06 | Disposition: A | Discharge status: Home / Self Care | Payer: Medicaid Other | Attending: Emergency Medicine | Admitting: Emergency Medicine

## 2017-12-06 ENCOUNTER — Other Ambulatory Visit: Payer: Self-pay | Admitting: Family Medicine

## 2017-12-06 ENCOUNTER — Other Ambulatory Visit: Payer: Self-pay

## 2017-12-06 DIAGNOSIS — E119 Type 2 diabetes mellitus without complications: Secondary | ICD-10-CM | POA: Insufficient documentation

## 2017-12-06 DIAGNOSIS — Z87891 Personal history of nicotine dependence: Secondary | ICD-10-CM | POA: Insufficient documentation

## 2017-12-06 DIAGNOSIS — I10 Essential (primary) hypertension: Secondary | ICD-10-CM | POA: Diagnosis not present

## 2017-12-06 DIAGNOSIS — Z79899 Other long term (current) drug therapy: Secondary | ICD-10-CM | POA: Insufficient documentation

## 2017-12-06 DIAGNOSIS — K921 Melena: Secondary | ICD-10-CM | POA: Diagnosis not present

## 2017-12-06 DIAGNOSIS — Z7901 Long term (current) use of anticoagulants: Secondary | ICD-10-CM | POA: Insufficient documentation

## 2017-12-06 DIAGNOSIS — R0789 Other chest pain: Secondary | ICD-10-CM | POA: Diagnosis not present

## 2017-12-06 DIAGNOSIS — R109 Unspecified abdominal pain: Secondary | ICD-10-CM | POA: Diagnosis present

## 2017-12-06 DIAGNOSIS — R1013 Epigastric pain: Secondary | ICD-10-CM

## 2017-12-06 DIAGNOSIS — Z7982 Long term (current) use of aspirin: Secondary | ICD-10-CM | POA: Insufficient documentation

## 2017-12-06 DIAGNOSIS — R110 Nausea: Secondary | ICD-10-CM | POA: Diagnosis not present

## 2017-12-06 DIAGNOSIS — R1012 Left upper quadrant pain: Secondary | ICD-10-CM

## 2017-12-06 LAB — COMPREHENSIVE METABOLIC PANEL
ALBUMIN: 4.3 g/dL (ref 3.5–5.0)
ALT: 52 U/L — ABNORMAL HIGH (ref 0–44)
ANION GAP: 9 (ref 5–15)
AST: 28 U/L (ref 15–41)
Alkaline Phosphatase: 60 U/L (ref 38–126)
BUN: 7 mg/dL (ref 6–20)
CO2: 22 mmol/L (ref 22–32)
Calcium: 9.1 mg/dL (ref 8.9–10.3)
Chloride: 106 mmol/L (ref 98–111)
Creatinine, Ser: 0.81 mg/dL (ref 0.61–1.24)
GLUCOSE: 175 mg/dL — AB (ref 70–99)
POTASSIUM: 3.8 mmol/L (ref 3.5–5.1)
Sodium: 137 mmol/L (ref 135–145)
TOTAL PROTEIN: 8 g/dL (ref 6.5–8.1)
Total Bilirubin: 0.6 mg/dL (ref 0.3–1.2)

## 2017-12-06 LAB — CBC
HEMATOCRIT: 44.4 % (ref 40.0–52.0)
HEMOGLOBIN: 15.1 g/dL (ref 13.0–18.0)
MCH: 31.1 pg (ref 26.0–34.0)
MCHC: 34.1 g/dL (ref 32.0–36.0)
MCV: 91.1 fL (ref 80.0–100.0)
Platelets: 233 10*3/uL (ref 150–440)
RBC: 4.87 MIL/uL (ref 4.40–5.90)
RDW: 14.2 % (ref 11.5–14.5)
WBC: 4.1 10*3/uL (ref 3.8–10.6)

## 2017-12-06 LAB — LIPASE, BLOOD: Lipase: 36 U/L (ref 11–51)

## 2017-12-06 MED ORDER — FAMOTIDINE 20 MG PO TABS: 20.0000 mg | ORAL_TABLET | Freq: Two times a day (BID) | ORAL | 0 refills | Status: DC

## 2017-12-06 MED ORDER — GI COCKTAIL ~~LOC~~
30.0000 mL | Freq: Once | ORAL | Status: AC
Start: 2017-12-06 — End: 2017-12-06
  Administered 2017-12-06: 30 mL via ORAL
  Filled 2017-12-06: qty 30

## 2017-12-06 MED ORDER — HYDROCODONE-ACETAMINOPHEN 5-325 MG PO TABS
1.0000 | ORAL_TABLET | Freq: Once | ORAL | Status: AC
  Administered 2017-12-06: 1 via ORAL
  Filled 2017-12-06: qty 1

## 2017-12-06 MED ORDER — FAMOTIDINE 20 MG PO TABS
20.0000 mg | ORAL_TABLET | Freq: Once | ORAL | Status: AC
  Administered 2017-12-06: 20 mg via ORAL
  Filled 2017-12-06: qty 1

## 2017-12-06 NOTE — ED Provider Notes (Signed)
[facility] Emergency Department Provider Note  ____________________________________________   First MD Initiated Contact with Patient 12/06/17 0413     (approximate)  I have reviewed the triage vital signs and the nursing notes.   HISTORY  Chief Complaint Abdominal Pain   HPI [name] [name] is a 42 y.o. male who comes to the emergency department with abdominal pain nausea and lower chest pain.  He was seen in our emergency department yesterday for the same and had a work-up including a CT scan with IV and oral contrast which was unremarkable.  He went home and his symptoms persisted so he came back to the emergency department for symptom opinion.  His symptoms are burning epigastric rising his chest.  They are worse in the morning somewhat improved throughout the day.  He takes no medications for acid reflux.  He does take multiple nonsteroidals.  His pain is constant.    Past Medical History:  Diagnosis Date  . ADHD (attention deficit hyperactivity disorder)   . CAD (coronary artery disease)    S/P cath May 2012 with BMS to the LAD following abnormal stress test  . Diabetes mellitus   . Drug abuse ([place])    history of marijuana use and alcohol  . Hypercholesterolemia   . Hypertension   . Obesity     Patient Active Problem List   Diagnosis Date Noted  . Heart palpitations 03/02/2011  . CAD (coronary artery disease) 10/29/2010  . Diabetes mellitus 10/01/2010  . Hypertension   . Heartburn   . Chest pain   . Hypercholesterolemia     Past Surgical History:  Procedure Laterality Date  . ABSCESS DRAINAGE     right arm; approx 2003  . CORONARY STENT PLACEMENT  May 2012   LAD    Prior to Admission medications   Medication Sig Start Date End Date Taking? Authorizing Provider  aspirin 81 MG tablet Take 81 mg by mouth daily.     [provider]  atorvastatin (LIPITOR) 20 MG tablet Take 20 mg by mouth daily.    [provider]    atorvastatin (LIPITOR) 40 MG tablet Take 1 tablet (40 mg total) by mouth daily. 06/03/11 06/02/12  Martinique, Peter M, MD  baclofen (LIORESAL) 10 MG tablet Take 1 tablet (10 mg total) by mouth 3 (three) times daily. 05/02/17 05/02/18  Laban Emperor, PA-C  carisoprodol (SOMA) 350 MG tablet Take 350 mg by mouth 4 (four) times daily as needed for muscle spasms.    [provider]  cyclobenzaprine (FLEXERIL) 5 MG tablet Take 1-2 tablets (5-10 mg total) by mouth 3 (three) times daily as needed for muscle spasms. 07/14/17   Duanne Guess, PA-C  famotidine (PEPCID) 20 MG tablet Take 1 tablet (20 mg total) by mouth 2 (two) times daily. 12/06/17 12/06/18  Darel Hong, MD  HYDROcodone-acetaminophen (NORCO) 5-325 MG tablet Take 1 tablet by mouth every 4 (four) hours as needed for moderate pain. 07/14/17   Duanne Guess, PA-C  lisinopril (PRINIVIL,ZESTRIL) 40 MG tablet Take 40 mg by mouth daily.    [provider]  metFORMIN (GLUCOPHAGE) 500 MG tablet Take 500 mg by mouth daily with breakfast.    [provider]  methocarbamol (ROBAXIN) 500 MG tablet Take 1 tablet (500 mg total) 4 (four) times daily by mouth. 04/20/17   Cuthriell, Charline Bills, PA-C  ondansetron (ZOFRAN ODT) 4 MG disintegrating tablet 4mg  ODT q4 hours prn nausea/vomit 04/08/13   Elnora Morrison, MD  predniSONE (  DELTASONE) 10 MG tablet Take 1 tablet (10 mg total) by mouth daily. 6,5,4,3,2,1 six day taper 07/14/17   Duanne Guess, PA-C    Allergies Penicillins  Family History  Problem Relation Age of Onset  . Hypertension Mother   . Diabetes Mother   . Hypertension Father   . Diabetes Father   . Heart disease Father        cabg 3; 3  . Hypertension Sister     Social History Social History   Tobacco Use  . Smoking status: Former Smoker    Types: Cigarettes    Last attempt to quit: 09/30/2009    Years since quitting: 8.1  . Smokeless tobacco: Never Used  Substance Use Topics  . Alcohol use: Yes     Alcohol/week: 6.0 oz    Types: 7 Cans of beer, 3 Shots of liquor per week  . Drug use: No    Types: Marijuana    Review of Systems Constitutional: No fever/chills Eyes: No visual changes. ENT: No sore throat. Cardiovascular: Positive for chest pain. Respiratory: Denies shortness of breath. Gastrointestinal: Positive for abdominal pain.  Positive for nausea, no vomiting.  No diarrhea.  No constipation. Genitourinary: Negative for dysuria. Musculoskeletal: Negative for back pain. Skin: Negative for rash. Neurological: Negative for headaches, focal weakness or numbness.   ____________________________________________   PHYSICAL EXAM:  VITAL SIGNS: ED Triage Vitals [12/06/17 0157]  Enc Vitals Group     BP (!) 136/92     Pulse Rate 84     Resp 20     Temp 97.8 F (36.6 C)     Temp Source Oral     SpO2 100 %     Weight      Height      Head Circumference      Peak Flow      Pain Score      Pain Loc      Pain Edu?      Excl. in [place]?     Constitutional: Alert and oriented x4 pleasant cooperative speaks full clear sentences no diaphoresis Eyes: PERRL EOMI. Head: Atraumatic. Nose: No congestion/rhinnorhea. Mouth/Throat: No trismus Neck: No stridor.   Cardiovascular: Normal rate, regular rhythm. Grossly normal heart sounds.  Good peripheral circulation. Respiratory: Normal respiratory effort.  No retractions. Lungs CTAB and moving good air Gastrointestinal: Obese soft nontender External rectal exam performed with no hemorrhoids or fissures noted Musculoskeletal: No lower extremity edema   Neurologic:  Normal speech and language. No gross focal neurologic deficits are appreciated. Skin:  Skin is warm, dry and intact. No rash noted. Psychiatric: Mood and affect are normal. Speech and behavior are normal.    ____________________________________________   DIFFERENTIAL includes but not limited to  Anal fissure, internal hemorrhoid, external hemorrhoid, diverticulitis,  gastric reflux, gastritis ____________________________________________   LABS (all labs ordered are listed, but only abnormal results are displayed)  Labs Reviewed  COMPREHENSIVE METABOLIC PANEL - Abnormal; Notable for the following components:      Result Value   Glucose, Bld 175 (*)    ALT 52 (*)    All other components within normal limits  LIPASE, BLOOD  CBC    Lab work reviewed by me with no acute disease __________________________________________  EKG   ____________________________________________  RADIOLOGY  CT scan from earlier today reviewed by me with no acute disease ____________________________________________   PROCEDURES  Procedure(s) performed: no  Procedures  Critical Care performed: no  ____________________________________________   INITIAL IMPRESSION / ASSESSMENT AND  PLAN / ED COURSE  Pertinent labs & imaging results that were available during my care of the patient were reviewed by me and considered in my medical decision making (see chart for details).   I had a lengthy discussion with patient regarding his results today and from earlier.  Given a GI cocktail and Pepcid with some improvement.  I do believe he warrants evaluation by gastroenterologist for both endoscopy and colonoscopy and I will make that referral.  He is discharged home in improved condition verbalizes understanding and agreement with the plan.      ____________________________________________   FINAL CLINICAL IMPRESSION(S) / ED DIAGNOSES  Final diagnoses:  Epigastric pain  Hematochezia      NEW MEDICATIONS STARTED DURING THIS VISIT:  Discharge Medication List as of 12/06/2017  4:30 AM    START taking these medications   Details  famotidine (PEPCID) 20 MG tablet Take 1 tablet (20 mg total) by mouth 2 (two) times daily., Starting Mon 12/06/2017, Until Tue 12/06/2018, Print         Note:  This document was prepared using Dragon voice recognition software and may  include unintentional dictation errors.     Darel Hong, MD 12/08/17 0800

## 2017-12-06 NOTE — ED Triage Notes (Signed)
Patient seen earlier on Sunday for similar symptoms.  Patient reports pain on left side is worse.

## 2017-12-06 NOTE — Discharge Instructions (Signed)
Please begin taking your Pepcid twice a day as prescribed and make an appointment to follow-up with the gastroenterologist for reevaluation.  It is also critically important that you stop taking meloxicam as well as ibuprofen, Motrin, naproxen, Aleve, or Toradol.  This class of medications can cause a tremendous amount of pain in your stomach.  Make sure you increase the amount of fiber in your diet and remain well-hydrated.  Return to the emergency department for any concerns.  It was a pleasure to take care of you today, and thank you for coming to our emergency department.  If you have any questions or concerns before leaving please ask the nurse to grab me and I'm more than happy to go through your aftercare instructions again.  If you were prescribed any opioid pain medication today such as Norco, Vicodin, Percocet, morphine, hydrocodone, or oxycodone please make sure you do not drive when you are taking this medication as it can alter your ability to drive safely.  If you have any concerns once you are home that you are not improving or are in fact getting worse before you can make it to your follow-up appointment, please do not hesitate to call 911 and come back for further evaluation.  Darel Hong, MD  Results for orders placed or performed during the hospital encounter of 12/06/17  Lipase, blood  Result Value Ref Range   Lipase 36 11 - 51 U/L  Comprehensive metabolic panel  Result Value Ref Range   Sodium 137 135 - 145 mmol/L   Potassium 3.8 3.5 - 5.1 mmol/L   Chloride 106 98 - 111 mmol/L   CO2 22 22 - 32 mmol/L   Glucose, Bld 175 (H) 70 - 99 mg/dL   BUN 7 6 - 20 mg/dL   Creatinine, Ser 0.81 0.61 - 1.24 mg/dL   Calcium 9.1 8.9 - 10.3 mg/dL   Total Protein 8.0 6.5 - 8.1 g/dL   Albumin 4.3 3.5 - 5.0 g/dL   AST 28 15 - 41 U/L   ALT 52 (H) 0 - 44 U/L   Alkaline Phosphatase 60 38 - 126 U/L   Total Bilirubin 0.6 0.3 - 1.2 mg/dL   GFR calc non Af Amer >60 >60 mL/min   GFR calc Af  Amer >60 >60 mL/min   Anion gap 9 5 - 15  CBC  Result Value Ref Range   WBC 4.1 3.8 - 10.6 K/uL   RBC 4.87 4.40 - 5.90 MIL/uL   Hemoglobin 15.1 13.0 - 18.0 g/dL   HCT 44.4 40.0 - 52.0 %   MCV 91.1 80.0 - 100.0 fL   MCH 31.1 26.0 - 34.0 pg   MCHC 34.1 32.0 - 36.0 g/dL   RDW 14.2 11.5 - 14.5 %   Platelets 233 150 - 440 K/uL   Ct Abdomen Pelvis W Contrast  Result Date: 12/05/2017 CLINICAL DATA:  Patient with left lower quadrant pain for 1 year. Rectal bleeding. EXAM: CT ABDOMEN AND PELVIS WITH CONTRAST TECHNIQUE: Multidetector CT imaging of the abdomen and pelvis was performed using the standard protocol following bolus administration of intravenous contrast. CONTRAST:  195mL OMNIPAQUE IOHEXOL 350 MG/ML SOLN COMPARISON:  CT abdomen pelvis 04/03/2009. FINDINGS: Lower chest: Normal heart size. Lung bases are clear. No pleural effusion. Hepatobiliary: Liver is normal in size and contour. No focal hepatic lesion is identified. Gallbladder is decompressed. No intrahepatic or extrahepatic biliary ductal dilatation. Pancreas: Unremarkable Spleen: Unremarkable Adrenals/Urinary Tract: Normal adrenal glands. Kidneys enhance symmetrically with contrast. Urinary  bladder is unremarkable. Stomach/Bowel: No abnormal bowel wall thickening or evidence for bowel obstruction. No free fluid or free intraperitoneal air. Normal morphology of the stomach. Vascular/Lymphatic: Normal caliber abdominal aorta. No retroperitoneal lymphadenopathy. Reproductive: Prostate is unremarkable. Other: None. Musculoskeletal: No aggressive or acute appearing osseous lesions. IMPRESSION: No acute process within the abdomen or pelvis. Electronically Signed   By: Lovey Newcomer M.D.   On: 12/05/2017 12:03

## 2017-12-13 ENCOUNTER — Ambulatory Visit: Payer: Medicaid Other

## 2017-12-16 ENCOUNTER — Encounter: Payer: Self-pay | Admitting: Gastroenterology

## 2017-12-17 ENCOUNTER — Ambulatory Visit
Admission: RE | Admit: 2017-12-17 | Discharge: 2017-12-17 | Disposition: A | Discharge status: Home / Self Care | Payer: Medicaid Other | Source: Ambulatory Visit | Attending: Family Medicine | Admitting: Family Medicine

## 2017-12-17 DIAGNOSIS — K760 Fatty (change of) liver, not elsewhere classified: Secondary | ICD-10-CM | POA: Diagnosis not present

## 2017-12-17 DIAGNOSIS — R1012 Left upper quadrant pain: Secondary | ICD-10-CM | POA: Insufficient documentation

## 2018-01-03 DIAGNOSIS — E1165 Type 2 diabetes mellitus with hyperglycemia: Secondary | ICD-10-CM | POA: Diagnosis not present

## 2018-01-03 DIAGNOSIS — R1012 Left upper quadrant pain: Secondary | ICD-10-CM | POA: Diagnosis not present

## 2018-01-03 DIAGNOSIS — F330 Major depressive disorder, recurrent, mild: Secondary | ICD-10-CM | POA: Diagnosis not present

## 2018-01-03 DIAGNOSIS — I10 Essential (primary) hypertension: Secondary | ICD-10-CM | POA: Diagnosis not present

## 2018-01-03 DIAGNOSIS — K625 Hemorrhage of anus and rectum: Secondary | ICD-10-CM | POA: Diagnosis not present

## 2018-01-03 DIAGNOSIS — Z8659 Personal history of other mental and behavioral disorders: Secondary | ICD-10-CM | POA: Diagnosis not present
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
## 2018-01-19 ENCOUNTER — Encounter: Payer: Self-pay | Admitting: Internal Medicine

## 2018-01-19 ENCOUNTER — Ambulatory Visit (INDEPENDENT_AMBULATORY_CARE_PROVIDER_SITE_OTHER): Admitting: Internal Medicine

## 2018-01-19 VITALS — BP 158/108 | HR 61 | Ht 75.0 in | Wt 286.2 lb

## 2018-01-19 DIAGNOSIS — K921 Melena: Secondary | ICD-10-CM

## 2018-01-19 DIAGNOSIS — R0789 Other chest pain: Secondary | ICD-10-CM | POA: Diagnosis not present

## 2018-01-19 DIAGNOSIS — I2510 Atherosclerotic heart disease of native coronary artery without angina pectoris: Secondary | ICD-10-CM | POA: Diagnosis not present

## 2018-01-19 DIAGNOSIS — E785 Hyperlipidemia, unspecified: Secondary | ICD-10-CM | POA: Diagnosis not present

## 2018-01-19 DIAGNOSIS — I10 Essential (primary) hypertension: Secondary | ICD-10-CM | POA: Diagnosis not present

## 2018-01-19 NOTE — Patient Instructions (Addendum)
Medication Instructions:  Your physician recommends that you continue on your current medications as directed. Please refer to the Current Medication list given to you today.   Labwork: Your physician recommends that you return for lab work ON SAME MORNING OF STRESS TEST. (LIPID, CMET). You will need to be FASTING. You may bring a snack or something to eat and drink prior to your stress test if you do it on the same day.  Testing/Procedures: Your physician has requested that you have an exercise tolerance test. For further information please visit [email or web address]. Please also follow instruction sheet, as given. DO NOT TAKE YOUR METOPROLOL THE NIGHT BEFORE OR MORNING OF.   DO NOT drink or eat foods with caffeine for 24 hours before the test. (Chocolate, coffee, tea, decaf coffee/tea, or energy drinks)  DO NOT smoke for 4 hours before your test.  If you use an inhaler, bring it with you to the test.  Wear comfortable shoes and clothing. MUST WEAR SNEAKERS OR WALKING SHOES.  Follow-Up: Your physician recommends that you schedule a follow-up appointment in: 3 MONTHS WITH DR END.      DASH Eating Plan DASH stands for "Dietary Approaches to Stop Hypertension." The DASH eating plan is a healthy eating plan that has been shown to reduce high blood pressure (hypertension). It may also reduce your risk for type 2 diabetes, heart disease, and stroke. The DASH eating plan may also help with weight loss. What are tips for following this plan? General guidelines  Avoid eating more than 2,300 mg (milligrams) of salt (sodium) a day. If you have hypertension, you may need to reduce your sodium intake to 1,500 mg a day.  Limit alcohol intake to no more than 1 drink a day for nonpregnant women and 2 drinks a day for men. One drink equals 12 oz of beer, 5 oz of wine, or 1 oz of hard liquor.  Work with your health care provider to maintain a healthy body weight or to lose weight. Ask what an  ideal weight is for you.  Get at least 30 minutes of exercise that causes your heart to beat faster (aerobic exercise) most days of the week. Activities may include walking, swimming, or biking.  Work with your health care provider or diet and nutrition specialist (dietitian) to adjust your eating plan to your individual calorie needs. Reading food labels  Check food labels for the amount of sodium per serving. Choose foods with less than 5 percent of the Daily Value of sodium. Generally, foods with less than 300 mg of sodium per serving fit into this eating plan.  To find whole grains, look for the word "whole" as the first word in the ingredient list. Shopping  Buy products labeled as "low-sodium" or "no salt added."  Buy fresh foods. Avoid canned foods and premade or frozen meals. Cooking  Avoid adding salt when cooking. Use salt-free seasonings or herbs instead of table salt or sea salt. Check with your health care provider or pharmacist before using salt substitutes.  Do not fry foods. Cook foods using healthy methods such as baking, boiling, grilling, and broiling instead.  Cook with heart-healthy oils, such as olive, canola, soybean, or sunflower oil. Meal planning   Eat a balanced diet that includes: ? 5 or more servings of fruits and vegetables each day. At each meal, try to fill half of your plate with fruits and vegetables. ? Up to 6-8 servings of whole grains each day. ? Less than 6 oz  of lean meat, poultry, or fish each day. A 3-oz serving of meat is about the same size as a deck of cards. One egg equals 1 oz. ? 2 servings of low-fat dairy each day. ? A serving of nuts, seeds, or beans 5 times each week. ? Heart-healthy fats. Healthy fats called Omega-3 fatty acids are found in foods such as flaxseeds and coldwater fish, like sardines, salmon, and mackerel.  Limit how much you eat of the following: ? Canned or prepackaged foods. ? Food that is high in trans fat, such  as fried foods. ? Food that is high in saturated fat, such as fatty meat. ? Sweets, desserts, sugary drinks, and other foods with added sugar. ? Full-fat dairy products.  Do not salt foods before eating.  Try to eat at least 2 vegetarian meals each week.  Eat more home-cooked food and less restaurant, buffet, and fast food.  When eating at a restaurant, ask that your food be prepared with less salt or no salt, if possible. What foods are recommended? The items listed may not be a complete list. Talk with your dietitian about what dietary choices are best for you. Grains Whole-grain or whole-wheat bread. Whole-grain or whole-wheat pasta. Brown rice. [place]. Bulgur. Whole-grain and low-sodium cereals. Pita bread. Low-fat, low-sodium crackers. Whole-wheat flour tortillas. Vegetables Fresh or frozen vegetables (raw, steamed, roasted, or grilled). Low-sodium or reduced-sodium tomato and vegetable juice. Low-sodium or reduced-sodium tomato sauce and tomato paste. Low-sodium or reduced-sodium canned vegetables. Fruits All fresh, dried, or frozen fruit. Canned fruit in natural juice (without added sugar). Meat and other protein foods Skinless chicken or [place]. Ground chicken or [place]. Pork with fat trimmed off. Fish and seafood. Egg whites. Dried beans, peas, or lentils. Unsalted nuts, nut butters, and seeds. Unsalted canned beans. Lean cuts of beef with fat trimmed off. Low-sodium, lean deli meat. Dairy Low-fat (1%) or fat-free (skim) milk. Fat-free, low-fat, or reduced-fat cheeses. Nonfat, low-sodium ricotta or cottage cheese. Low-fat or nonfat yogurt. Low-fat, low-sodium cheese. Fats and oils Soft margarine without trans fats. Vegetable oil. Low-fat, reduced-fat, or light mayonnaise and salad dressings (reduced-sodium). Canola, safflower, olive, soybean, and sunflower oils. Avocado. Seasoning and other foods Herbs. Spices. Seasoning mixes without salt. Unsalted popcorn and pretzels.  Fat-free sweets. What foods are not recommended? The items listed may not be a complete list. Talk with your dietitian about what dietary choices are best for you. Grains Baked goods made with fat, such as croissants, muffins, or some breads. Dry pasta or rice meal packs. Vegetables Creamed or fried vegetables. Vegetables in a cheese sauce. Regular canned vegetables (not low-sodium or reduced-sodium). Regular canned tomato sauce and paste (not low-sodium or reduced-sodium). Regular tomato and vegetable juice (not low-sodium or reduced-sodium). Angie Fava. Olives. Fruits Canned fruit in a light or heavy syrup. Fried fruit. Fruit in cream or butter sauce. Meat and other protein foods Fatty cuts of meat. Ribs. Fried meat. Berniece Salines. Sausage. Bologna and other processed lunch meats. Salami. Fatback. Hotdogs. Bratwurst. Salted nuts and seeds. Canned beans with added salt. Canned or smoked fish. Whole eggs or egg yolks. Chicken or [place] with skin. Dairy Whole or 2% milk, cream, and half-and-half. Whole or full-fat cream cheese. Whole-fat or sweetened yogurt. Full-fat cheese. Nondairy creamers. Whipped toppings. Processed cheese and cheese spreads. Fats and oils Butter. Stick margarine. Lard. Shortening. Ghee. Bacon fat. Tropical oils, such as coconut, palm kernel, or palm oil. Seasoning and other foods Salted popcorn and pretzels. Onion salt, garlic salt, seasoned salt,  table salt, and sea salt. Worcestershire sauce. Tartar sauce. Barbecue sauce. Teriyaki sauce. Soy sauce, including reduced-sodium. Steak sauce. Canned and packaged gravies. Fish sauce. Oyster sauce. Cocktail sauce. Horseradish that you find on the shelf. Ketchup. Mustard. Meat flavorings and tenderizers. Bouillon cubes. Hot sauce and Tabasco sauce. Premade or packaged marinades. Premade or packaged taco seasonings. Relishes. Regular salad dressings. Where to find more information:  National Heart, Lung, and [place]:  https://wilson-eaton.com/  American Heart Association: www.heart.org Summary  The DASH eating plan is a healthy eating plan that has been shown to reduce high blood pressure (hypertension). It may also reduce your risk for type 2 diabetes, heart disease, and stroke.  With the DASH eating plan, you should limit salt (sodium) intake to 2,300 mg a day. If you have hypertension, you may need to reduce your sodium intake to 1,500 mg a day.  When on the DASH eating plan, aim to eat more fresh fruits and vegetables, whole grains, lean proteins, low-fat dairy, and heart-healthy fats.  Work with your health care provider or diet and nutrition specialist (dietitian) to adjust your eating plan to your individual calorie needs. This information is not intended to replace advice given to you by your health care provider. Make sure you discuss any questions you have with your health care provider. Document Released: 05/14/2011 Document Revised: 05/18/2016 Document Reviewed: 05/18/2016 Elsevier Interactive Patient Education  2018 [employer].    Exercise Stress Electrocardiogram An exercise stress electrocardiogram is a test to check how blood flows to your heart. It is done to find areas of poor blood flow. You will need to walk on a treadmill for this test. The electrocardiogram will record your heartbeat when you are at rest and when you are exercising. What happens before the procedure?  Do not have drinks with caffeine or foods with caffeine for 24 hours before the test, or as told by your doctor. This includes coffee, tea (even decaf tea), sodas, chocolate, and cocoa.  Follow your doctor's instructions about eating and drinking before the test.  Ask your doctor what medicines you should or should not take before the test. Take your medicines with water unless told by your doctor not to.  If you use an inhaler, bring it with you to the test.  Bring a snack to eat after the test.  Do not  smoke for 4  hours before the test.  Do not put lotions, powders, creams, or oils on your chest before the test.  Wear comfortable shoes and clothing. What happens during the procedure?  You will have patches put on your chest. Small areas of your chest may need to be shaved. Wires will be connected to the patches.  Your heart rate will be watched while you are resting and while you are exercising.  You will walk on the treadmill. The treadmill will slowly get faster to raise your heart rate.  The test will take about 1-2 hours. What happens after the procedure?  Your heart rate and blood pressure will be watched after the test.  You may return to your normal diet, activities, and medicines or as told by your doctor. This information is not intended to replace advice given to you by your health care provider. Make sure you discuss any questions you have with your health care provider. Document Released: 11/11/2007 Document Revised: 01/22/2016 Document Reviewed: 01/30/2013 Elsevier Interactive Patient Education  [employer].

## 2018-01-20 ENCOUNTER — Telehealth: Payer: Self-pay | Admitting: *Deleted

## 2018-01-20 MED ORDER — NITROGLYCERIN 0.4 MG SL SUBL: 0.4000 mg | SUBLINGUAL_TABLET | SUBLINGUAL | 4 refills | Status: DC | PRN

## 2018-01-20 NOTE — Telephone Encounter (Signed)
After patient left appointment yesterday, Dr End requested patient to have prescription for Nitroglycerin sent in.  Called patient and he verbalized understanding of how to take the nitro and to call 911 or go to ER for chest pain not relieved after 3 doses. Rx sent to pharmacy.

## 2018-01-27 ENCOUNTER — Ambulatory Visit: Payer: Medicaid Other | Admitting: Gastroenterology

## 2018-01-27 ENCOUNTER — Encounter: Payer: Self-pay | Admitting: Gastroenterology

## 2018-01-27 ENCOUNTER — Other Ambulatory Visit: Payer: Self-pay

## 2018-01-27 VITALS — BP 127/85 | HR 70 | Ht 75.0 in | Wt 279.6 lb

## 2018-01-27 DIAGNOSIS — K625 Hemorrhage of anus and rectum: Secondary | ICD-10-CM

## 2018-01-27 NOTE — Progress Notes (Signed)
Jonathon Bellows MD, MRCP(U.K) [address]  [place]  [place], [place] [address]  Main: [phone]  Fax: [phone]   Gastroenterology Consultation  Referring Provider:     Denton Lank, MD Primary Care Physician:  [facility] Primary Gastroenterologist:  Dr. Jonathon Bellows  Reason for Consultation:     Rectal bleeding         HPI:   [name] [name] is a 42 y.o. y/o male referred for consultation & management  by Dr. Domingo Madeira, [place].    He was seen at the Er on 12/05/17 for blood in his stool. CT abdomen 12/05/17 - negative.  Seen at the ER on 12/06/17 for epigastric pain. Treated for reflux. Seen by Dr End on 01/19/18 for chest pain . Being set up for  Exercise tolerance test.     Rectal bleeding :  Onset and where was blood seen  :3 years back , since then on and off. Bright red at times, on paper, toilet bowel and on the stool Frequency of bowel movements :once a day  Consistency : not hard  Change in shape of stool:no  Pain associated with bowel movements:no  Blood thinner usage:asprin  NSAID's: none  Prior colonoscopy :no  Family history of colon cancer or polyps:no  Weight loss:no   No other complaints. Has some pains in his left flank .     CBC Latest Ref Rng & Units 12/06/2017 12/05/2017 07/14/2017  WBC 3.8 - 10.6 K/uL 4.1 7.0 6.2  Hemoglobin 13.0 - 18.0 g/dL 15.1 15.3 14.0  Hematocrit 40.0 - 52.0 % 44.4 45.2 41.8  Platelets 150 - 440 K/uL 233 236 245       Past Medical History:  Diagnosis Date  . ADHD (attention deficit hyperactivity disorder)   . CAD (coronary artery disease)    S/P cath May 2012 with BMS to the LAD following abnormal stress test  . Diabetes mellitus   . Drug abuse ([place])    history of marijuana use and alcohol  . Hypercholesterolemia   . Hypertension   . Obesity     Past Surgical History:  Procedure Laterality Date  . ABSCESS DRAINAGE     right arm; approx 2003  . CORONARY STENT  PLACEMENT  May 2012   LAD    Prior to Admission medications   Medication Sig Start Date End Date Taking? Authorizing Provider  aspirin 81 MG tablet Take 81 mg by mouth daily.     [provider]  atorvastatin (LIPITOR) 40 MG tablet Take 1 tablet (40 mg total) by mouth daily. 06/03/11 01/19/18  Martinique, Peter M, MD  baclofen (LIORESAL) 10 MG tablet Take 1 tablet (10 mg total) by mouth 3 (three) times daily. Patient not taking: Reported on 01/19/2018 05/02/17 05/02/18  Laban Emperor, PA-C  carisoprodol (SOMA) 350 MG tablet Take 350 mg by mouth 4 (four) times daily as needed for muscle spasms.    [provider]  cyclobenzaprine (FLEXERIL) 5 MG tablet Take 1-2 tablets (5-10 mg total) by mouth 3 (three) times daily as needed for muscle spasms. 07/14/17   Duanne Guess, PA-C  lisinopril (PRINIVIL,ZESTRIL) 40 MG tablet Take 40 mg by mouth daily.    [provider]  metFORMIN (GLUCOPHAGE) 500 MG tablet Take 500 mg by mouth daily with breakfast.    [provider]  METOPROLOL SUCCINATE ER PO Take 25 mg by mouth at bedtime.    [provider]  nitroGLYCERIN (NITROSTAT) 0.4  MG SL tablet Place 1 tablet (0.4 mg total) under the tongue every 5 (five) minutes as needed for chest pain. Maximum of 3 doses. 01/20/18 04/20/18  End, Harrell Gave, MD  ondansetron (ZOFRAN ODT) 4 MG disintegrating tablet 4mg  ODT q4 hours prn nausea/vomit 04/08/13   Elnora Morrison, MD  pantoprazole (PROTONIX) 40 MG tablet Take 40 mg by mouth daily.    [provider]  QUEtiapine (SEROQUEL XR) 50 MG TB24 24 hr tablet Take by mouth.    [provider]    Family History  Problem Relation Age of Onset  . Hypertension Mother   . Diabetes Mother   . Hypertension Father   . Diabetes Father   . Heart disease Father        cabg 3; 1995; 18 stents  . Hypertension Sister   . Heart disease Paternal Uncle   . Heart disease Paternal Grandmother      Social History   Tobacco  Use  . Smoking status: Former Smoker    Packs/day: 1.00    Years: 14.00    Pack years: 14.00    Types: Cigarettes    Last attempt to quit: 06/2017    Years since quitting: 0.6  . Smokeless tobacco: Never Used  Substance Use Topics  . Alcohol use: Yes    Alcohol/week: 10.0 standard drinks    Types: 2 Cans of beer, 8 Shots of liquor per week  . Drug use: Yes    Frequency: 20.0 times per week    Types: Marijuana    Allergies as of 01/27/2018 - Review Complete 01/19/2018  Allergen Reaction Noted  . Penicillins  10/01/2010    Review of Systems:    All systems reviewed and negative except where noted in HPI.   Physical Exam:  BP 127/85   Pulse 70   Ht 6\' 3"  (1.905 m)   Wt 279 lb 9.6 oz (126.8 kg)   BMI 34.95 kg/m  No LMP for male patient. Psych:  Alert and cooperative. Normal mood and affect. General:   Alert,  Well-developed, well-nourished, pleasant and cooperative in NAD Head:  Normocephalic and atraumatic. Eyes:  Sclera clear, no icterus.   Conjunctiva pink. Ears:  Normal auditory acuity. Nose:  No deformity, discharge, or lesions. Mouth:  No deformity or lesions,oropharynx pink & moist. Neck:  Supple; no masses or thyromegaly. Lungs:  Respirations even and unlabored.  Clear throughout to auscultation.   No wheezes, crackles, or rhonchi. No acute distress. Heart:  Regular rate and rhythm; no murmurs, clicks, rubs, or gallops. Abdomen:  Normal bowel sounds.  No bruits.  Soft, non-tender and non-distended without masses, hepatosplenomegaly or hernias noted.  No guarding or rebound tenderness.    Neurologic:  Alert and oriented x3;  grossly normal neurologically. Skin:  Intact without significant lesions or rashes. No jaundice. Lymph Nodes:  No significant cervical adenopathy. Psych:  Alert and cooperative. Normal mood and affect.  Imaging Studies: No results found.  Assessment and Plan:   [name] is a 42 y.o. y/o male has been referred for rectal bleeding.      Plan  1. Colonoscopy after he completes cardiac work.    I have discussed alternative options, risks & benefits,  which include, but are not limited to, bleeding, infection, perforation,respiratory complication & drug reaction.  The patient agrees with this plan & written consent will be obtained.     Follow up PRN  Dr Jonathon Bellows MD,MRCP(U.K)

## 2018-02-01 ENCOUNTER — Telehealth: Payer: Self-pay | Admitting: *Deleted

## 2018-02-01 ENCOUNTER — Other Ambulatory Visit

## 2018-02-01 NOTE — Telephone Encounter (Signed)
Left a message instructing the patient to hold his metoprolol tonight and tomorrow morning in preparation for his ETT tomorrow.

## 2018-02-02 ENCOUNTER — Other Ambulatory Visit (INDEPENDENT_AMBULATORY_CARE_PROVIDER_SITE_OTHER): Admitting: *Deleted

## 2018-02-02 ENCOUNTER — Ambulatory Visit (INDEPENDENT_AMBULATORY_CARE_PROVIDER_SITE_OTHER)

## 2018-02-02 DIAGNOSIS — R0789 Other chest pain: Secondary | ICD-10-CM

## 2018-02-02 DIAGNOSIS — I2510 Atherosclerotic heart disease of native coronary artery without angina pectoris: Secondary | ICD-10-CM | POA: Diagnosis not present

## 2018-02-02 DIAGNOSIS — E785 Hyperlipidemia, unspecified: Secondary | ICD-10-CM

## 2018-02-02 LAB — EXERCISE TOLERANCE TEST
CSEPED: 8 min
CSEPEDS: 59 s
CSEPEW: 10.1 METS
CSEPHR: 86 %
CSEPPHR: 155 {beats}/min
MPHR: 180 {beats}/min
RPE: 15
Rest HR: 84 {beats}/min

## 2018-02-03 LAB — COMPREHENSIVE METABOLIC PANEL
ALBUMIN: 4.7 g/dL (ref 3.5–5.5)
ALK PHOS: 80 IU/L (ref 39–117)
ALT: 50 IU/L — ABNORMAL HIGH (ref 0–44)
AST: 18 IU/L (ref 0–40)
Albumin/Globulin Ratio: 1.7 (ref 1.2–2.2)
BUN / CREAT RATIO: 9 (ref 9–20)
BUN: 9 mg/dL (ref 6–24)
Bilirubin Total: 0.5 mg/dL (ref 0.0–1.2)
CO2: 23 mmol/L (ref 20–29)
CREATININE: 0.96 mg/dL (ref 0.76–1.27)
Calcium: 9.8 mg/dL (ref 8.7–10.2)
Chloride: 102 mmol/L (ref 96–106)
GFR calc Af Amer: 114 mL/min/{1.73_m2} (ref >59)
GFR calc non Af Amer: 98 mL/min/{1.73_m2} (ref >59)
GLUCOSE: 139 mg/dL — AB (ref 65–99)
Globulin, Total: 2.7 g/dL (ref 1.5–4.5)
Potassium: 4.1 mmol/L (ref 3.5–5.2)
Sodium: 142 mmol/L (ref 134–144)
Total Protein: 7.4 g/dL (ref 6.0–8.5)

## 2018-02-03 LAB — LIPID PANEL
Chol/HDL Ratio: 4.8 ratio (ref 0.0–5.0)
Cholesterol, Total: 179 mg/dL (ref 100–199)
HDL: 37 mg/dL — ABNORMAL LOW (ref >39)
LDL Calculated: 117 mg/dL — ABNORMAL HIGH (ref 0–99)
Triglycerides: 124 mg/dL (ref 0–149)
VLDL CHOLESTEROL CAL: 25 mg/dL (ref 5–40)

## 2018-02-09 ENCOUNTER — Telehealth: Payer: Self-pay | Admitting: Internal Medicine

## 2018-02-09 NOTE — Telephone Encounter (Signed)
° °  [facility] Pre-operative Risk Assessment    Request for surgical clearance:  1. What type of surgery is being performed? colonoscopy   2. When is this surgery scheduled? Pending clearance   3. What type of clearance is required (medical clearance vs. Pharmacy clearance to hold med vs. Both)? Medical   4. Are there any medications that need to be held prior to surgery and how long? Not noted   5. Practice name and name of physician performing surgery? [facility]   Dr. Vicente Males   6. What is your office phone number [phone]    7.   What is your office fax number [phone]   8.   Anesthesia type (None, local, MAC, general) ? Not noted    Clarisse Gouge 02/09/2018, 8:18 AM  _________________________________________________________________   (provider comments below)

## 2018-02-09 NOTE — Telephone Encounter (Signed)
Routing to Dr End. 

## 2018-02-10 NOTE — Telephone Encounter (Signed)
Routed to number provided via EPIC fax.  

## 2018-02-10 NOTE — Telephone Encounter (Signed)
Ok to proceed with colonoscopy, which is a low risk procedure.  Recent GXT was normal except for hypertensive BP response.  Nelva Bush, MD [facility] Pager: [phone]

## 2018-02-11 ENCOUNTER — Other Ambulatory Visit: Payer: Self-pay

## 2018-02-11 ENCOUNTER — Telehealth: Payer: Self-pay

## 2018-02-11 DIAGNOSIS — K625 Hemorrhage of anus and rectum: Secondary | ICD-10-CM

## 2018-02-11 NOTE — Telephone Encounter (Signed)
Called pt to inform him that we have received his cardiac clearance from Dr. Saunders Revel and can now proceed with scheduling the colonoscopy. Pt is aware of procedure date and prep instructions.

## 2018-02-11 NOTE — Telephone Encounter (Signed)
-----   Message from Rushie Chestnut, [place] sent at 01/27/2018  4:34 PM EDT ----- Check for cardiac clearance then schedule colonoscopy

## 2018-02-14 ENCOUNTER — Other Ambulatory Visit: Payer: Self-pay | Admitting: *Deleted

## 2018-02-14 DIAGNOSIS — Z79899 Other long term (current) drug therapy: Secondary | ICD-10-CM

## 2018-02-14 DIAGNOSIS — E7800 Pure hypercholesterolemia, unspecified: Secondary | ICD-10-CM

## 2018-02-14 MED ORDER — ATORVASTATIN CALCIUM 80 MG PO TABS: 80.0000 mg | ORAL_TABLET | Freq: Every day | ORAL | 3 refills | Status: DC

## 2018-03-02 ENCOUNTER — Other Ambulatory Visit: Payer: Self-pay

## 2018-03-02 MED ORDER — NA SULFATE-K SULFATE-MG SULF 17.5-3.13-1.6 GM/177ML PO SOLN: 1.0000 | Freq: Once | ORAL | 0 refills | Status: AC

## 2018-03-03 ENCOUNTER — Encounter: Payer: Self-pay | Admitting: Anesthesiology

## 2018-03-03 ENCOUNTER — Encounter: Payer: Self-pay | Admitting: *Deleted

## 2018-03-03 ENCOUNTER — Ambulatory Visit
Admission: RE | Admit: 2018-03-03 | Discharge: 2018-03-03 | Disposition: A | Discharge status: Home / Self Care | Source: Ambulatory Visit | Attending: Gastroenterology | Admitting: Gastroenterology

## 2018-03-03 ENCOUNTER — Telehealth: Payer: Self-pay

## 2018-03-03 ENCOUNTER — Other Ambulatory Visit: Payer: Self-pay

## 2018-03-03 ENCOUNTER — Encounter
Admission: RE | Disposition: A | Discharge status: Home / Self Care | Payer: Self-pay | Source: Ambulatory Visit | Attending: Gastroenterology

## 2018-03-03 DIAGNOSIS — K625 Hemorrhage of anus and rectum: Secondary | ICD-10-CM

## 2018-03-03 DIAGNOSIS — Z539 Procedure and treatment not carried out, unspecified reason: Secondary | ICD-10-CM | POA: Diagnosis present

## 2018-03-03 SURGERY — COLONOSCOPY WITH PROPOFOL
Anesthesia: General

## 2018-03-03 MED ORDER — PROPOFOL 10 MG/ML IV BOLUS
INTRAVENOUS | Status: AC
  Filled 2018-03-03: qty 20

## 2018-03-03 MED ORDER — PROPOFOL 500 MG/50ML IV EMUL
INTRAVENOUS | Status: AC
  Filled 2018-03-03: qty 50

## 2018-03-03 MED ORDER — PEG 3350-KCL-NA BICARB-NACL 420 G PO SOLR: 4000.0000 mL | Freq: Once | ORAL | 0 refills | Status: AC

## 2018-03-03 MED ORDER — SODIUM CHLORIDE 0.9 % IV SOLN: INTRAVENOUS | Status: DC

## 2018-03-03 MED ORDER — LIDOCAINE HCL (PF) 2 % IJ SOLN
INTRAMUSCULAR | Status: AC
  Filled 2018-03-03: qty 10

## 2018-03-03 NOTE — Anesthesia Preprocedure Evaluation (Deleted)
Anesthesia Evaluation  Patient identified by MRN, date of birth, ID band Patient awake    Reviewed: Allergy & Precautions, H&P , NPO status , reviewed documented beta blocker date and time   Airway        Dental   Pulmonary former smoker,           Cardiovascular hypertension, + CAD and + Cardiac Stents    Notes recorded by Nelva Bush, MD on 02/02/2018 at 1:02 PM EDT Please let Mr. Cabello know that his stress test did not show evidence of a significant blockage. His blood pressure was quite elevated during exercise. He should continue his current medications and follow-up in the office as previously discussed   Neuro/Psych PSYCHIATRIC DISORDERS    GI/Hepatic   Endo/Other  diabetes  Renal/GU      Musculoskeletal   Abdominal   Peds  Hematology   Anesthesia Other Findings Past Medical History: No date: ADHD (attention deficit hyperactivity disorder) No date: CAD (coronary artery disease)     Comment:  S/P cath May 2012 with BMS to the LAD following abnormal              stress test No date: Diabetes mellitus No date: Drug abuse ([place])     Comment:  history of marijuana use and alcohol No date: Hypercholesterolemia No date: Hypertension No date: Obesity  Past Surgical History: No date: ABSCESS DRAINAGE     Comment:  right arm; approx 2003 May 2012: CORONARY STENT PLACEMENT     Comment:  LAD     Reproductive/Obstetrics                             Anesthesia Physical Anesthesia Plan  ASA: III  Anesthesia Plan: General   Post-op Pain Management:    Induction: Intravenous  PONV Risk Score and Plan: Treatment may vary due to age or medical condition and TIVA  Airway Management Planned: Nasal Cannula and Natural Airway  Additional Equipment:   Intra-op Plan:   Post-operative Plan:   Informed Consent: I have reviewed the patients History and Physical, chart, labs and  discussed the procedure including the risks, benefits and alternatives for the proposed anesthesia with the patient or authorized representative who has indicated his/her understanding and acceptance.   Dental Advisory Given  Plan Discussed with:   Anesthesia Plan Comments:         Anesthesia Quick Evaluation

## 2018-03-03 NOTE — Telephone Encounter (Signed)
Called pt was able to speak with his wife. I informed her that I have sent pt prescription for Golytely bowel prep to pt preferred pharmacy. I also clarified instructions for prep and the importance of drinking the bowel prep entirely.

## 2018-03-03 NOTE — Telephone Encounter (Signed)
-----   Message from Jonathon Bellows, MD sent at 03/03/2018  9:33 AM EDT ----- Regarding: reschedule for tomorrow   Please reschedule procedure for tomorrow as he only drank half the prep - send another script for golytely and reprep

## 2018-03-03 NOTE — H&P (Deleted)
[name] Bellows, MD [address], [place], [place], [place], [address] [address], [place], [place], [place], [address] Phone: [phone]  Fax: [phone]  Primary Care Physician:  Denton Lank, MD   Pre-Procedure History & Physical: HPI:  [name] [name] is a 42 y.o. male is here for an colonoscopy.   Past Medical History:  Diagnosis Date  . ADHD (attention deficit hyperactivity disorder)   . CAD (coronary artery disease)    S/P cath May 2012 with BMS to the LAD following abnormal stress test  . Diabetes mellitus   . Drug abuse ([place])    history of marijuana use and alcohol  . Hypercholesterolemia   . Hypertension   . Obesity     Past Surgical History:  Procedure Laterality Date  . ABSCESS DRAINAGE     right arm; approx 2003  . CARDIAC CATHETERIZATION    . CORONARY ANGIOPLASTY    . CORONARY STENT PLACEMENT  May 2012   LAD    Prior to Admission medications   Medication Sig Start Date End Date Taking? Authorizing Provider  aspirin 81 MG tablet Take 81 mg by mouth daily.    Yes [provider]  atorvastatin (LIPITOR) 80 MG tablet Take 1 tablet (80 mg total) by mouth daily. 02/14/18 05/15/18 Yes End, Harrell Gave, MD  lisinopril (PRINIVIL,ZESTRIL) 40 MG tablet Take 40 mg by mouth daily.   Yes [provider]  metFORMIN (GLUCOPHAGE) 500 MG tablet Take 500 mg by mouth daily with breakfast.   Yes [provider]  METOPROLOL SUCCINATE ER PO Take 25 mg by mouth at bedtime.   Yes [provider]  ondansetron (ZOFRAN ODT) 4 MG disintegrating tablet 4mg  ODT q4 hours prn nausea/vomit 04/08/13  Yes Elnora Morrison, MD  pantoprazole (PROTONIX) 40 MG tablet Take 40 mg by mouth daily.   Yes [provider]  QUEtiapine (SEROQUEL XR) 50 MG TB24 24 hr tablet Take by mouth.   Yes [provider]  baclofen (LIORESAL) 10 MG tablet Take 1 tablet (10 mg total) by mouth 3 (three) times daily. 05/02/17 05/02/18  Laban Emperor,  PA-C  carisoprodol (SOMA) 350 MG tablet Take 350 mg by mouth 4 (four) times daily as needed for muscle spasms.    [provider]  cyclobenzaprine (FLEXERIL) 5 MG tablet Take 1-2 tablets (5-10 mg total) by mouth 3 (three) times daily as needed for muscle spasms. 07/14/17   Duanne Guess, PA-C  nitroGLYCERIN (NITROSTAT) 0.4 MG SL tablet Place 1 tablet (0.4 mg total) under the tongue every 5 (five) minutes as needed for chest pain. Maximum of 3 doses. 01/20/18 04/20/18  Nelva Bush, MD    Allergies as of 02/11/2018 - Review Complete 01/27/2018  Allergen Reaction Noted  . Penicillins  10/01/2010    Family History  Problem Relation Age of Onset  . Hypertension Mother   . Diabetes Mother   . Hypertension Father   . Diabetes Father   . Heart disease Father        cabg 3; 1995; 18 stents  . Hypertension Sister   . Heart disease Paternal Uncle   . Heart disease Paternal Grandmother     Social History   Socioeconomic History  . Marital status: Married    Spouse name: Not on file  . Number of children: 4  . Years of education: Not on file  . Highest education level: Not on file  Occupational History  . Occupation: disabled  Social Needs  . Financial resource strain: Not on file  . Food insecurity:    Worry: Not on file    Inability: Not on file  . Transportation needs:    Medical: Not on file    Non-medical: Not on file  Tobacco Use  . Smoking status: Former Smoker    Packs/day: 1.00    Years: 14.00    Pack years: 14.00    Types: Cigarettes    Last attempt to quit: 06/2017    Years since quitting: 0.7  . Smokeless tobacco: Never Used  Substance and Sexual Activity  . Alcohol use: Yes    Alcohol/week: 10.0 standard drinks    Types: 2 Cans of beer, 8 Shots of liquor per week  . Drug use: Yes    Frequency: 20.0 times per week    Types: Marijuana  . Sexual activity: Yes  Lifestyle  . Physical activity:    Days per week: Not on file    Minutes per session:  Not on file  . Stress: Not on file  Relationships  . Social connections:    Talks on phone: Not on file    Gets together: Not on file    Attends religious service: Not on file    Active member of club or organization: Not on file    Attends meetings of clubs or organizations: Not on file    Relationship status: Not on file  . Intimate partner violence:    Fear of current or ex partner: Not on file    Emotionally abused: Not on file    Physically abused: Not on file    Forced sexual activity: Not on file  Other Topics Concern  . Not on file  Social History Narrative  . Not on file    Review of Systems: See HPI, otherwise negative ROS  Physical Exam: There were no vitals taken for this visit. General:   Alert,  pleasant and cooperative in NAD Head:  Normocephalic and atraumatic. Neck:  Supple; no masses or thyromegaly. Lungs:  Clear throughout to auscultation, normal respiratory effort.    Heart:  +S1, +S2, Regular rate and rhythm, No edema. Abdomen:  Soft, nontender and nondistended. Normal bowel sounds, without guarding, and without rebound.   Neurologic:  Alert and  oriented x4;  grossly normal neurologically.  Impression/Plan: [name] [name] is here for an colonoscopy to be performed for rectal bleeding Risks, benefits, limitations, and alternatives regarding  colonoscopy have been reviewed with the patient.  Questions have been answered.  All parties agreeable.   [name] Bellows, MD  03/03/2018, 9:28 AM

## 2018-03-04 ENCOUNTER — Encounter: Admission: RE | Payer: Self-pay | Source: Ambulatory Visit

## 2018-03-04 ENCOUNTER — Ambulatory Visit: Admission: RE | Admit: 2018-03-04 | Source: Ambulatory Visit | Admitting: Gastroenterology

## 2018-03-04 DIAGNOSIS — Z1211 Encounter for screening for malignant neoplasm of colon: Secondary | ICD-10-CM

## 2018-03-04 SURGERY — COLONOSCOPY WITH PROPOFOL
Anesthesia: General

## 2018-03-23 ENCOUNTER — Emergency Department

## 2018-03-23 ENCOUNTER — Other Ambulatory Visit: Payer: Self-pay

## 2018-03-23 ENCOUNTER — Encounter: Payer: Self-pay | Admitting: Emergency Medicine

## 2018-03-23 ENCOUNTER — Emergency Department
Admission: EM | Admit: 2018-03-23 | Discharge: 2018-03-23 | Disposition: A | Discharge status: Home / Self Care | Attending: Emergency Medicine | Admitting: Emergency Medicine

## 2018-03-23 DIAGNOSIS — Z79899 Other long term (current) drug therapy: Secondary | ICD-10-CM | POA: Insufficient documentation

## 2018-03-23 DIAGNOSIS — Z7982 Long term (current) use of aspirin: Secondary | ICD-10-CM | POA: Insufficient documentation

## 2018-03-23 DIAGNOSIS — I10 Essential (primary) hypertension: Secondary | ICD-10-CM | POA: Diagnosis not present

## 2018-03-23 DIAGNOSIS — R0602 Shortness of breath: Secondary | ICD-10-CM | POA: Diagnosis not present

## 2018-03-23 DIAGNOSIS — F909 Attention-deficit hyperactivity disorder, unspecified type: Secondary | ICD-10-CM | POA: Insufficient documentation

## 2018-03-23 DIAGNOSIS — R05 Cough: Secondary | ICD-10-CM | POA: Diagnosis not present

## 2018-03-23 DIAGNOSIS — J029 Acute pharyngitis, unspecified: Secondary | ICD-10-CM | POA: Diagnosis present

## 2018-03-23 DIAGNOSIS — Z955 Presence of coronary angioplasty implant and graft: Secondary | ICD-10-CM | POA: Insufficient documentation

## 2018-03-23 DIAGNOSIS — I2510 Atherosclerotic heart disease of native coronary artery without angina pectoris: Secondary | ICD-10-CM | POA: Insufficient documentation

## 2018-03-23 DIAGNOSIS — J209 Acute bronchitis, unspecified: Secondary | ICD-10-CM

## 2018-03-23 DIAGNOSIS — Z7984 Long term (current) use of oral hypoglycemic drugs: Secondary | ICD-10-CM | POA: Insufficient documentation

## 2018-03-23 DIAGNOSIS — Z87891 Personal history of nicotine dependence: Secondary | ICD-10-CM | POA: Insufficient documentation

## 2018-03-23 DIAGNOSIS — E119 Type 2 diabetes mellitus without complications: Secondary | ICD-10-CM | POA: Insufficient documentation

## 2018-03-23 LAB — GROUP A STREP BY PCR: Group A Strep by PCR: NOT DETECTED

## 2018-03-23 MED ORDER — PREDNISONE 10 MG PO TABS: ORAL_TABLET | ORAL | 0 refills | Status: DC

## 2018-03-23 MED ORDER — GUAIFENESIN-CODEINE 100-10 MG/5ML PO SOLN: 5.0000 mL | ORAL | 0 refills | Status: DC | PRN

## 2018-03-23 MED ORDER — ACETAMINOPHEN 500 MG PO TABS
1000.0000 mg | ORAL_TABLET | Freq: Once | ORAL | Status: AC
  Administered 2018-03-23: 1000 mg via ORAL
  Filled 2018-03-23: qty 2

## 2018-03-23 MED ORDER — SULFAMETHOXAZOLE-TRIMETHOPRIM 800-160 MG PO TABS: 1.0000 | ORAL_TABLET | Freq: Two times a day (BID) | ORAL | 0 refills | Status: DC

## 2018-03-23 NOTE — ED Notes (Signed)
Patient transported to X-ray 

## 2018-03-23 NOTE — ED Notes (Signed)
Pt returned from X-ray.  

## 2018-03-23 NOTE — ED Provider Notes (Signed)
[facility] Emergency Department Provider Note  ____________________________________________   None    (approximate)  I have reviewed the triage vital signs and the nursing notes.   HISTORY  Chief Complaint Sore Throat and Shortness of Breath    HPI [name] [name] is a 42 y.o. male patient presents to the ED with complaint of sore throat and cough.  Patient states that he has been taking over-the-counter medication without any relief of his cough and congestion.  He states that he has had this off and on for approximately 2 weeks.  He is unaware of any fever or chills but wife states that he has not taken his temperature.  She also had similar symptoms 3 weeks ago.  He denies any vomiting or diarrhea.  Rates his discomfort as a 9 out of 10.   Past Medical History:  Diagnosis Date  . ADHD (attention deficit hyperactivity disorder)   . CAD (coronary artery disease)    S/P cath May 2012 with BMS to the LAD following abnormal stress test  . Diabetes mellitus   . Drug abuse ([place])    history of marijuana use and alcohol  . Hypercholesterolemia   . Hypertension   . Obesity     Patient Active Problem List   Diagnosis Date Noted  . Hematochezia 01/19/2018  . Heart palpitations 03/02/2011  . CAD (coronary artery disease) 10/29/2010  . Diabetes mellitus 10/01/2010  . Essential hypertension   . Heartburn   . Atypical chest pain   . Hypercholesterolemia     Past Surgical History:  Procedure Laterality Date  . ABSCESS DRAINAGE     right arm; approx 2003  . CARDIAC CATHETERIZATION    . CORONARY ANGIOPLASTY    . CORONARY STENT PLACEMENT  May 2012   LAD    Prior to Admission medications   Medication Sig Start Date End Date Taking? Authorizing Provider  aspirin 81 MG tablet Take 81 mg by mouth daily.     [provider]  atorvastatin (LIPITOR) 80 MG tablet Take 1 tablet (80 mg total) by mouth daily. 02/14/18 05/15/18  End, Harrell Gave, MD    baclofen (LIORESAL) 10 MG tablet Take 1 tablet (10 mg total) by mouth 3 (three) times daily. 05/02/17 05/02/18  Laban Emperor, PA-C  carisoprodol (SOMA) 350 MG tablet Take 350 mg by mouth 4 (four) times daily as needed for muscle spasms.    [provider]  cyclobenzaprine (FLEXERIL) 5 MG tablet Take 1-2 tablets (5-10 mg total) by mouth 3 (three) times daily as needed for muscle spasms. 07/14/17   Duanne Guess, PA-C  guaiFENesin-codeine 100-10 MG/5ML syrup Take 5 mLs by mouth every 4 (four) hours as needed. 03/23/18   Johnn Hai, PA-C  lisinopril (PRINIVIL,ZESTRIL) 40 MG tablet Take 40 mg by mouth daily.    [provider]  metFORMIN (GLUCOPHAGE) 500 MG tablet Take 500 mg by mouth daily with breakfast.    [provider]  METOPROLOL SUCCINATE ER PO Take 25 mg by mouth at bedtime.    [provider]  nitroGLYCERIN (NITROSTAT) 0.4 MG SL tablet Place 1 tablet (0.4 mg total) under the tongue every 5 (five) minutes as needed for chest pain. Maximum of 3 doses. 01/20/18 04/20/18  End, Harrell Gave, MD  ondansetron (ZOFRAN ODT) 4 MG disintegrating tablet 4mg  ODT q4 hours prn nausea/vomit 04/08/13   Elnora Morrison, MD  pantoprazole (PROTONIX) 40 MG tablet Take 40 mg by mouth daily.    [provider]  predniSONE (DELTASONE) 10 MG tablet Take 6 tablets  today, on day 2 take 5 tablets, day 3 take 4 tablets, day 4 take 3 tablets, day 5 take  2 tablets and 1 tablet the last day 03/23/18   Johnn Hai, PA-C  QUEtiapine (SEROQUEL XR) 50 MG TB24 24 hr tablet Take by mouth.    [provider]  sulfamethoxazole-trimethoprim (BACTRIM DS,SEPTRA DS) 800-160 MG tablet Take 1 tablet by mouth 2 (two) times daily. 03/23/18   Johnn Hai, PA-C    Allergies Penicillins  Family History  Problem Relation Age of Onset  . Hypertension Mother   . Diabetes Mother   . Hypertension Father   . Diabetes Father   . Heart disease Father        cabg 3;  1995; 18 stents  . Hypertension Sister   . Heart disease Paternal Uncle   . Heart disease Paternal Grandmother     Social History Social History   Tobacco Use  . Smoking status: Former Smoker    Packs/day: 1.00    Years: 14.00    Pack years: 14.00    Types: Cigarettes    Last attempt to quit: 06/2017    Years since quitting: 0.7  . Smokeless tobacco: Never Used  Substance Use Topics  . Alcohol use: Yes    Alcohol/week: 10.0 standard drinks    Types: 2 Cans of beer, 8 Shots of liquor per week  . Drug use: Yes    Frequency: 20.0 times per week    Types: Marijuana    Review of Systems Constitutional: No fever/chills Eyes: No visual changes. ENT: Positive sore throat. Cardiovascular: Denies chest pain. Respiratory: Denies shortness of breath.  Positive productive cough. Gastrointestinal: No abdominal pain.  No nausea, no vomiting.  Musculoskeletal: Negative for back pain. Skin: Negative for rash. Neurological: Negative for headaches, focal weakness or numbness. ___________________________________________   PHYSICAL EXAM:  VITAL SIGNS: ED Triage Vitals  Enc Vitals Group     BP 03/23/18 0729 (!) 140/93     Pulse Rate 03/23/18 0729 65     Resp 03/23/18 0729 15     Temp 03/23/18 0729 97.6 F (36.4 C)     Temp Source 03/23/18 0729 Oral     SpO2 03/23/18 0729 100 %     Weight 03/23/18 0730 280 lb (127 kg)     Height 03/23/18 0730 6\' 3"  (1.905 m)     Head Circumference --      Peak Flow --      Pain Score 03/23/18 0729 9     Pain Loc --      Pain Edu? --      Excl. in [place]? --    Constitutional: Alert and oriented. Well appearing and in no acute distress. Eyes: Conjunctivae are normal.  Head: Atraumatic. Nose: Mild congestion/rhinnorhea.  EACs are clear bilaterally.  TMs are dull but without injection or erythema.  Poor light reflex noted. Mouth/Throat: Mucous membranes are moist.  Oropharynx non-erythematous.  Posterior drainage noted. Neck: No stridor.     Hematological/Lymphatic/Immunilogical: No cervical lymphadenopathy. Cardiovascular: Normal rate, regular rhythm. Grossly normal heart sounds.  Good peripheral circulation. Respiratory: Normal respiratory effort.  No retractions. Lungs CTAB. Gastrointestinal: Soft and nontender. No distention. Neurologic:  Normal speech and language. No gross focal neurologic deficits are appreciated. No gait instability. Skin:  Skin is warm, dry and intact. No rash noted. Psychiatric: Mood and affect are normal. Speech and behavior are normal.  ____________________________________________  LABS (all labs ordered are listed, but only abnormal results are displayed)  Labs Reviewed  GROUP A STREP BY PCR     RADIOLOGY  ED MD interpretation:   Chest x-ray is negative for pneumonia but does show questionable bronchitis.  Official radiology report(s): Dg Chest 2 View  Result Date: 03/23/2018 CLINICAL DATA:  Shortness of breath, sore throat for 2 days, cough, former smoking history EXAM: CHEST - 2 VIEW COMPARISON:  Chest x-ray of 07/14/2017 FINDINGS: No active infiltrate or effusion is seen. Mediastinal and hilar contours are unremarkable. There is mild peribronchial thickening which may indicate bronchitis. The heart is within normal limits in size. No bony abnormality is seen. IMPRESSION: No pneumonia or pleural effusion.  Question bronchitis. Electronically Signed   By: Ivar Drape M.D.   On: 03/23/2018 09:19    ____________________________________________   PROCEDURES  Procedure(s) performed: None  Procedures  Critical Care performed: No  ____________________________________________   INITIAL IMPRESSION / ASSESSMENT AND PLAN / ED COURSE  As part of my medical decision making, I reviewed the following data within the electronic MEDICAL RECORD NUMBER Notes from prior ED visits and [place] Controlled Substance Database  Patient presents to the ED with complaint of cough and congestion for last 2  weeks.  He states that the cough is gotten worse over the last several days especially at night.  He is taken over-the-counter medication without any relief.  He states that he is a non-smoker but admits to smoking "weed".  Patient denies any previous history of bronchitis or pneumonia.  Chest x-ray is suggestive of bronchitis and on physical exam clinically patient does have bronchitis.  Patient was given a prescription for guaifenesin with codeine as needed for cough and congestion.  Prednisone taper and Bactrim DS twice daily for 10 days.  Patient is to follow-up with his PCP if any continued problems.  He is encouraged to stay hydrated.  ____________________________________________   FINAL CLINICAL IMPRESSION(S) / ED DIAGNOSES  Final diagnoses:  Acute bronchitis, unspecified organism     ED Discharge Orders         Ordered    predniSONE (DELTASONE) 10 MG tablet     03/23/18 0931    sulfamethoxazole-trimethoprim (BACTRIM DS,SEPTRA DS) 800-160 MG tablet  2 times daily     03/23/18 0931    guaiFENesin-codeine 100-10 MG/5ML syrup  Every 4 hours PRN     03/23/18 0931           Note:  This document was prepared using Dragon voice recognition software and may include unintentional dictation errors.    Johnn Hai, PA-C 03/23/18 1328    Schuyler Amor, MD 03/23/18 [phone]

## 2018-03-23 NOTE — ED Triage Notes (Addendum)
Patient states he has a sore throat on both sides of his throat.  Has his tonsils.  Denies known exposure to anyone with similar symptoms.  States he feels SHOB.  Speaking in full sentences.  Wearing face mask.  Lung firelds clear bilaterally A&P to auscultation.  Color good, skin warm and dry.  RR - 16. Patient wearing face mask.

## 2018-03-23 NOTE — ED Notes (Signed)
Pt resting in bed, NAD

## 2018-03-23 NOTE — Discharge Instructions (Addendum)
Follow-up with your primary care provider if any continued problems.  Begin taking medication as provided.  Bactrim DS twice daily for 10 days.  Prednisone started today with 6 tablets and tapering down each day.  Guaifenesin with codeine as needed for cough and congestion.  Increase fluids.  You may also take Tylenol as needed for throat pain with this medication.  Do not take NSAIDs such as ibuprofen, Aleve, or Advil with this medication.

## 2018-04-06 DIAGNOSIS — G4733 Obstructive sleep apnea (adult) (pediatric): Secondary | ICD-10-CM | POA: Diagnosis not present

## 2018-04-11 ENCOUNTER — Ambulatory Visit (INDEPENDENT_AMBULATORY_CARE_PROVIDER_SITE_OTHER): Admitting: Physician Assistant

## 2018-04-11 ENCOUNTER — Encounter: Payer: Self-pay | Admitting: Physician Assistant

## 2018-04-11 VITALS — BP 110/74 | HR 76 | Temp 98.1°F | Resp 16 | Ht 75.0 in | Wt 298.0 lb

## 2018-04-11 DIAGNOSIS — E119 Type 2 diabetes mellitus without complications: Secondary | ICD-10-CM

## 2018-04-11 DIAGNOSIS — Z114 Encounter for screening for human immunodeficiency virus [HIV]: Secondary | ICD-10-CM

## 2018-04-11 DIAGNOSIS — G4730 Sleep apnea, unspecified: Secondary | ICD-10-CM

## 2018-04-11 DIAGNOSIS — R079 Chest pain, unspecified: Secondary | ICD-10-CM

## 2018-04-11 DIAGNOSIS — K219 Gastro-esophageal reflux disease without esophagitis: Secondary | ICD-10-CM | POA: Diagnosis not present

## 2018-04-11 DIAGNOSIS — I2510 Atherosclerotic heart disease of native coronary artery without angina pectoris: Secondary | ICD-10-CM | POA: Diagnosis not present

## 2018-04-11 DIAGNOSIS — F329 Major depressive disorder, single episode, unspecified: Secondary | ICD-10-CM | POA: Diagnosis not present

## 2018-04-11 DIAGNOSIS — Z6837 Body mass index (BMI) 37.0-37.9, adult: Secondary | ICD-10-CM | POA: Diagnosis not present

## 2018-04-11 DIAGNOSIS — F909 Attention-deficit hyperactivity disorder, unspecified type: Secondary | ICD-10-CM | POA: Diagnosis not present

## 2018-04-11 DIAGNOSIS — I10 Essential (primary) hypertension: Secondary | ICD-10-CM

## 2018-04-11 DIAGNOSIS — F32A Depression, unspecified: Secondary | ICD-10-CM

## 2018-04-11 DIAGNOSIS — Z23 Encounter for immunization: Secondary | ICD-10-CM | POA: Diagnosis not present

## 2018-04-11 MED ORDER — AMLODIPINE BESYLATE 5 MG PO TABS: 5.0000 mg | ORAL_TABLET | Freq: Every day | ORAL | 0 refills | Status: DC

## 2018-04-11 MED ORDER — ATORVASTATIN CALCIUM 80 MG PO TABS: 80.0000 mg | ORAL_TABLET | Freq: Every day | ORAL | 0 refills | Status: DC

## 2018-04-11 MED ORDER — METFORMIN HCL 500 MG PO TABS: 1000.0000 mg | ORAL_TABLET | Freq: Every day | ORAL | 0 refills | Status: DC

## 2018-04-11 MED ORDER — QUETIAPINE FUMARATE ER 50 MG PO TB24: 50.0000 mg | ORAL_TABLET | Freq: Every day | ORAL | 0 refills | Status: DC

## 2018-04-11 MED ORDER — PANTOPRAZOLE SODIUM 40 MG PO TBEC: 40.0000 mg | DELAYED_RELEASE_TABLET | Freq: Every day | ORAL | 0 refills | Status: DC

## 2018-04-11 MED ORDER — NITROGLYCERIN 0.4 MG SL SUBL: 0.4000 mg | SUBLINGUAL_TABLET | SUBLINGUAL | 4 refills | Status: DC | PRN

## 2018-04-11 MED ORDER — METOPROLOL SUCCINATE ER 25 MG PO TB24: 25.0000 mg | ORAL_TABLET | Freq: Every day | ORAL | 0 refills | Status: DC
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
## 2018-04-12 LAB — HEMOGLOBIN A1C
Est. average glucose Bld gHb Est-mCnc: 180 mg/dL
Hgb A1c MFr Bld: 7.9 % — ABNORMAL HIGH (ref 4.8–5.6)

## 2018-04-12 LAB — CBC WITH DIFFERENTIAL/PLATELET
Basophils Absolute: 0.1 10*3/uL (ref 0.0–0.2)
Basos: 1 %
EOS (ABSOLUTE): 0.2 10*3/uL (ref 0.0–0.4)
Eos: 2 %
Hematocrit: 38.8 % (ref 37.5–51.0)
Hemoglobin: 13.3 g/dL (ref 13.0–17.7)
Immature Grans (Abs): 0 10*3/uL (ref 0.0–0.1)
Immature Granulocytes: 0 %
Lymphocytes Absolute: 3.9 10*3/uL — ABNORMAL HIGH (ref 0.7–3.1)
Lymphs: 56 %
MCH: 30.2 pg (ref 26.6–33.0)
MCHC: 34.3 g/dL (ref 31.5–35.7)
MCV: 88 fL (ref 79–97)
Monocytes Absolute: 0.4 10*3/uL (ref 0.1–0.9)
Monocytes: 6 %
Neutrophils Absolute: 2.4 10*3/uL (ref 1.4–7.0)
Neutrophils: 35 %
Platelets: 238 10*3/uL (ref 150–450)
RBC: 4.4 x10E6/uL (ref 4.14–5.80)
RDW: 13.7 % (ref 12.3–15.4)
WBC: 7 10*3/uL (ref 3.4–10.8)

## 2018-04-12 LAB — COMPREHENSIVE METABOLIC PANEL
ALT: 39 IU/L (ref 0–44)
AST: 19 IU/L (ref 0–40)
Albumin/Globulin Ratio: 1.9 (ref 1.2–2.2)
Albumin: 4.8 g/dL (ref 3.5–5.5)
Alkaline Phosphatase: 70 IU/L (ref 39–117)
BUN/Creatinine Ratio: 7 — ABNORMAL LOW (ref 9–20)
BUN: 7 mg/dL (ref 6–24)
Bilirubin Total: <0.2 mg/dL (ref 0.0–1.2)
CO2: 18 mmol/L — ABNORMAL LOW (ref 20–29)
Calcium: 9.4 mg/dL (ref 8.7–10.2)
Chloride: 100 mmol/L (ref 96–106)
Creatinine, Ser: 0.96 mg/dL (ref 0.76–1.27)
GFR calc Af Amer: 114 mL/min/{1.73_m2} (ref >59)
GFR calc non Af Amer: 98 mL/min/{1.73_m2} (ref >59)
Globulin, Total: 2.5 g/dL (ref 1.5–4.5)
Glucose: 128 mg/dL — ABNORMAL HIGH (ref 65–99)
Potassium: 4.3 mmol/L (ref 3.5–5.2)
Sodium: 138 mmol/L (ref 134–144)
Total Protein: 7.3 g/dL (ref 6.0–8.5)

## 2018-04-12 LAB — LIPID PANEL
Chol/HDL Ratio: 6.6 ratio — ABNORMAL HIGH (ref 0.0–5.0)
Cholesterol, Total: 218 mg/dL — ABNORMAL HIGH (ref 100–199)
HDL: 33 mg/dL — ABNORMAL LOW (ref >39)
Triglycerides: 497 mg/dL — ABNORMAL HIGH (ref 0–149)

## 2018-04-12 LAB — TSH: TSH: 0.566 u[IU]/mL (ref 0.450–4.500)

## 2018-04-12 LAB — HIV ANTIBODY (ROUTINE TESTING W REFLEX): HIV Screen 4th Generation wRfx: NONREACTIVE

## 2018-04-12 NOTE — Progress Notes (Signed)
Called [facility]

## 2018-04-13 ENCOUNTER — Telehealth: Payer: Self-pay

## 2018-04-13 ENCOUNTER — Other Ambulatory Visit: Payer: Self-pay | Admitting: Physician Assistant

## 2018-04-13 NOTE — Telephone Encounter (Signed)
Per patient He had not been taking his Lipitor for for a week and reports that he just picked up the prescription and with the Metformin he reports that he only has been taking 500mg  once daily.

## 2018-04-13 NOTE — Telephone Encounter (Signed)
Please review other telephone counter.  Thanks.

## 2018-04-13 NOTE — Telephone Encounter (Signed)
-----   Message from [name], [place] sent at 04/13/2018  2:19 PM EST ----- Cholesterol uncontrolled. Is he taking the lipitor 40 mg daily that Dr. Saunders Revel changed him to? If he is not, he should. If he is, I will increase Lipitor to 80 mg daily. Also, A1c is 7.9%. I will increase his metformin to 1000 mg BID over the next couple of weeks. Remainder of labwork is stable.

## 2018-04-13 NOTE — Telephone Encounter (Signed)
-----   Message from [name], [place] sent at 04/13/2018  2:20 PM EST ----- Can he take his 1000 mg metformin in the morning and 500 mg at night for one week, then 1000 mg twice daily metformin.   ----- Message ----- From: Paulene Floor Sent: 04/12/2018   9:50 AM EST To: [place]  Can we please add direct LDL under hyperlipidemia E78.5?

## 2018-04-14 NOTE — Telephone Encounter (Signed)
LVMTRC 

## 2018-04-14 NOTE — Telephone Encounter (Signed)
Continue Lipitor 40 mg and recheck. Take metformin 500 mg twice daily x 1 wk. Then 1000 mg QAM and 500 mg QHS x 1 wk. Then 1000 mg twice daily onward. Can you change his follow up to one month instead of two weeks? Then we can do DM and HTN at the same time. Thanks.

## 2018-04-19 NOTE — Telephone Encounter (Signed)
na

## 2018-04-19 NOTE — Telephone Encounter (Signed)
Patient advised as below. Patient verbalizes understanding and is in agreement with treatment plan.  

## 2018-04-24 LAB — SPECIMEN STATUS REPORT

## 2018-04-24 LAB — LDL CHOLESTEROL, DIRECT: LDL Direct: 115 mg/dL — ABNORMAL HIGH (ref 0–99)

## 2018-04-27 ENCOUNTER — Ambulatory Visit: Admitting: Internal Medicine

## 2018-04-27 NOTE — Progress Notes (Deleted)
Follow-up Outpatient Visit Date: 04/27/2018  Primary Care Provider: [name], PA-C [address] [name] [address]  Chief Complaint: ***  HPI:  [name] [name] is a 42 y.o. year-old male with history of coronary artery disease status post bare-metal stent to the LAD in 2012, hypertension, hyperlipidemia, diabetes mellitus, obesity, ADHD, and polysubstance abuse, who presents for follow-up of left shoulder pain.  I met him in August, at which time he reported several weeks of left shoulder pain, that was exacerbated by exercises such as doing push-ups.  He was concerned that this could be an anginal equivalent, however, given his history of coronary artery disease.  Exercise tolerance test was low risk without evidence of ischemia.  Hypertensive blood pressure response was noted.  --------------------------------------------------------------------------------------------------  Cardiovascular History & Procedures: Cardiovascular Problems:  Known coronary artery disease status post PCI to the LAD (10/2010)  Risk Factors:  Known CAD, hypertension, hyperlipidemia, diabetes mellitus, obesity, and male gender  Cath/PCI:  LHC/PCI (10/14/2010): LMCA normal.  LAD with 95% proximal stenosis.  Small D1 with 99% proximal stenosis.  LCx with mild luminal irregularities.  RCA without significant disease.  LVEF 60%.  Successful albeit challenging PCI to the proximal LAD (difficult to find proper guide catheter) with placement of a 4.0 x 24 mm Veriflex bare-metal stent.  CV Surgery:  None  EP Procedures and Devices:  None  Non-Invasive Evaluation(s):  Exercise tolerance test (02/02/2018): Low risk study without evidence of ischemia.  Hypertensive blood pressure response.  Patient exercised 8 minutes, 59 seconds with peak blood pressure of 240/139 and maximal heart rate of 155 bpm (86% MPHR).  Pharmacologic MPI (10/13/2010): Stress only images obtained, demonstrating  decreased uptake in the anterior wall suggestive of ischemia.  Recent CV Pertinent Labs: Lab Results  Component Value Date   CHOL 218 (H) 04/11/2018   HDL 33 (L) 04/11/2018   LDLCALC Comment 04/11/2018   LDLDIRECT 115 (H) 04/11/2018   TRIG 497 (H) 04/11/2018   CHOLHDL 6.6 (H) 04/11/2018   CHOLHDL 4 11/23/2011   INR 0.89 10/13/2010   K 4.3 04/11/2018   BUN 7 04/11/2018   CREATININE 0.96 04/11/2018   CREATININE 0.78 10/13/2010    Past medical and surgical history were reviewed and updated in EPIC.  No outpatient medications have been marked as taking for the 04/27/18 encounter (Appointment) with , Harrell Gave, MD.    Allergies: Penicillins  Social History   Tobacco Use  . Smoking status: Former Smoker    Packs/day: 1.00    Years: 14.00    Pack years: 14.00    Types: Cigarettes    Last attempt to quit: 06/2017    Years since quitting: 0.8  . Smokeless tobacco: Never Used  Substance Use Topics  . Alcohol use: Yes    Alcohol/week: 10.0 standard drinks    Types: 2 Cans of beer, 8 Shots of liquor per week  . Drug use: Yes    Frequency: 20.0 times per week    Types: Marijuana    Family History  Problem Relation Age of Onset  . Hypertension Mother   . Diabetes Mother   . Hypertension Father   . Diabetes Father   . Heart disease Father        cabg 3; 1995; 18 stents  . Hypertension Sister   . Heart disease Paternal Uncle   . Heart disease Paternal Grandmother     Review of Systems: A 12-system review of systems was performed and was negative except  as noted in the HPI.  --------------------------------------------------------------------------------------------------  Physical Exam: There were no vitals taken for this visit.  General:  *** HEENT: No conjunctival pallor or scleral icterus. Moist mucous membranes.  OP clear. Neck: Supple without lymphadenopathy, thyromegaly, JVD, or HJR. No carotid bruit. Lungs: Normal work of breathing. Clear to  auscultation bilaterally without wheezes or crackles. Heart: Regular rate and rhythm without murmurs, rubs, or gallops. Non-displaced PMI. Abd: Bowel sounds present. Soft, NT/ND without hepatosplenomegaly Ext: No lower extremity edema. Radial, PT, and DP pulses are 2+ bilaterally. Skin: Warm and dry without rash.  EKG:  ***  Lab Results  Component Value Date   WBC 7.0 04/11/2018   HGB 13.3 04/11/2018   HCT 38.8 04/11/2018   MCV 88 04/11/2018   PLT 238 04/11/2018    Lab Results  Component Value Date   NA 138 04/11/2018   K 4.3 04/11/2018   CL 100 04/11/2018   CO2 18 (L) 04/11/2018   BUN 7 04/11/2018   CREATININE 0.96 04/11/2018   GLUCOSE 128 (H) 04/11/2018   ALT 39 04/11/2018    Lab Results  Component Value Date   CHOL 218 (H) 04/11/2018   HDL 33 (L) 04/11/2018   LDLCALC Comment 04/11/2018   LDLDIRECT 115 (H) 04/11/2018   TRIG 497 (H) 04/11/2018   CHOLHDL 6.6 (H) 04/11/2018    --------------------------------------------------------------------------------------------------  ASSESSMENT AND PLAN: [name], MD 04/27/2018 7:11 AM

## 2018-04-28 ENCOUNTER — Ambulatory Visit: Payer: Self-pay | Admitting: Physician Assistant

## 2018-04-28 ENCOUNTER — Encounter: Payer: Self-pay | Admitting: Internal Medicine

## 2018-05-12 ENCOUNTER — Ambulatory Visit: Payer: Self-pay | Admitting: Physician Assistant

## 2018-05-12 NOTE — Progress Notes (Deleted)
Patient: [name] [name] Male    DOB: 12-02-1975   42 y.o.   MRN: [id number] Visit Date: 05/12/2018  Today's Provider: [name], PA-C   No chief complaint on file.  Subjective:    HPI  Hypertension, follow-up:  BP Readings from Last 3 Encounters:  04/11/18 110/74  03/23/18 (!) 154/96  01/27/18 127/85    He was last seen for hypertension 1 months ago.  BP at that visit was ***. Management changes since that visit include ***. He reports {excellent/good/fair/poor:19665} compliance with treatment. He {ACTION; IS/IS [id number]:[id number]} having side effects. *** He {is/is not:9024} exercising. He {is/is not:9024} adherent to low salt diet.   Outside blood pressures are ***. He is experiencing {Symptoms; cardiac:12860}.  Patient denies {Symptoms; cardiac:12860}.   Cardiovascular risk factors include {cv risk factors:510}.  Use of agents associated with hypertension: {bp agents assoc with hypertension:511::"none"}.     Weight trend: {trend:16658} Wt Readings from Last 3 Encounters:  04/11/18 298 lb (135.2 kg)  03/23/18 280 lb (127 kg)  01/27/18 279 lb 9.6 oz (126.8 kg)    Current diet: {diet habits:16563}  ------------------------------------------------------------------------  Diabetes Mellitus Type II, Follow-up:   Lab Results  Component Value Date   HGBA1C 7.9 (H) 04/11/2018   HGBA1C 6.6 (H) 11/23/2011    Last seen for diabetes 1 months ago.  Management since then includes ***. He reports {excellent/good/fair/poor:19665} compliance with treatment. He {ACTION; IS/IS [id number]} having side effects. *** Current symptoms include {Symptoms; diabetes:14075} and have been {Desc; course:15616}. Home blood sugar records: {diabetes glucometry results:16657}  Episodes of hypoglycemia? {yes***/no:17258}   Current Insulin Regimen: *** Most Recent Eye Exam: *** Weight trend: {trend:16658} Prior visit with dietician: {yes/no:17258} Current diet: {diet  habits:16563} Current exercise: {exercise types:16438}  Pertinent Labs:    Component Value Date/Time   CHOL 218 (H) 04/11/2018 1532   TRIG 497 (H) 04/11/2018 1532   HDL 33 (L) 04/11/2018 1532   LDLCALC Comment 04/11/2018 1532   CREATININE 0.96 04/11/2018 1532   CREATININE 0.78 10/13/2010 1727    Wt Readings from Last 3 Encounters:  04/11/18 298 lb (135.2 kg)  03/23/18 280 lb (127 kg)  01/27/18 279 lb 9.6 oz (126.8 kg)    ------------------------------------------------------------------------     Allergies  Allergen Reactions  . Penicillins      Current Outpatient Medications:  .  amLODipine (NORVASC) 5 MG tablet, Take 1 tablet (5 mg total) by mouth daily., Disp: 90 tablet, Rfl: 0 .  aspirin 81 MG tablet, Take 81 mg by mouth daily. , Disp: , Rfl:  .  atorvastatin (LIPITOR) 80 MG tablet, Take 1 tablet (80 mg total) by mouth daily., Disp: 90 tablet, Rfl: 0 .  cyclobenzaprine (FLEXERIL) 5 MG tablet, Take 1-2 tablets (5-10 mg total) by mouth 3 (three) times daily as needed for muscle spasms., Disp: 20 tablet, Rfl: 0 .  lisinopril (PRINIVIL,ZESTRIL) 40 MG tablet, Take 40 mg by mouth daily., Disp: , Rfl:  .  metFORMIN (GLUCOPHAGE) 500 MG tablet, Take 2 tablets (1,000 mg total) by mouth daily with breakfast., Disp: 180 tablet, Rfl: 0 .  metoprolol succinate (TOPROL-XL) 25 MG 24 hr tablet, Take 1 tablet (25 mg total) by mouth at bedtime., Disp: 90 tablet, Rfl: 0 .  nitroGLYCERIN (NITROSTAT) 0.4 MG SL tablet, Place 1 tablet (0.4 mg total) under the tongue every 5 (five) minutes as needed for chest pain. Maximum of 3 doses., Disp: 25 tablet, Rfl: 4 .  ondansetron (ZOFRAN ODT) 4 MG disintegrating  tablet, 4mg  ODT q4 hours prn nausea/vomit, Disp: 8 tablet, Rfl: 0 .  pantoprazole (PROTONIX) 40 MG tablet, Take 1 tablet (40 mg total) by mouth daily., Disp: 90 tablet, Rfl: 0 .  QUEtiapine (SEROQUEL XR) 50 MG TB24 24 hr tablet, Take 1 tablet (50 mg total) by mouth at bedtime., Disp: 90 each,  Rfl: 0  Review of Systems  Social History   Tobacco Use  . Smoking status: Former Smoker    Packs/day: 1.00    Years: 14.00    Pack years: 14.00    Types: Cigarettes    Last attempt to quit: 06/2017    Years since quitting: 0.9  . Smokeless tobacco: Never Used  Substance Use Topics  . Alcohol use: Yes    Alcohol/week: 10.0 standard drinks    Types: 2 Cans of beer, 8 Shots of liquor per week   Objective:   There were no vitals taken for this visit. There were no vitals filed for this visit.   Physical Exam      Assessment & Plan:           [name], PA-C  [facility]

## 2018-05-13 ENCOUNTER — Ambulatory Visit (INDEPENDENT_AMBULATORY_CARE_PROVIDER_SITE_OTHER): Admitting: Physician Assistant

## 2018-05-13 ENCOUNTER — Encounter: Payer: Self-pay | Admitting: Physician Assistant

## 2018-05-13 VITALS — BP 146/96 | HR 73 | Temp 97.7°F | Resp 16 | Wt 282.0 lb

## 2018-05-13 DIAGNOSIS — Z23 Encounter for immunization: Secondary | ICD-10-CM

## 2018-05-13 DIAGNOSIS — E1165 Type 2 diabetes mellitus with hyperglycemia: Secondary | ICD-10-CM

## 2018-05-13 DIAGNOSIS — M545 Low back pain, unspecified: Secondary | ICD-10-CM

## 2018-05-13 DIAGNOSIS — I10 Essential (primary) hypertension: Secondary | ICD-10-CM | POA: Diagnosis not present

## 2018-05-13 DIAGNOSIS — M62838 Other muscle spasm: Secondary | ICD-10-CM

## 2018-05-13 LAB — POCT UA - MICROALBUMIN: Microalbumin Ur, POC: 20 mg/L

## 2018-05-13 MED ORDER — AMLODIPINE BESYLATE 10 MG PO TABS: 10.0000 mg | ORAL_TABLET | Freq: Every day | ORAL | 0 refills | Status: DC

## 2018-05-13 MED ORDER — CYCLOBENZAPRINE HCL 5 MG PO TABS: 5.0000 mg | ORAL_TABLET | Freq: Two times a day (BID) | ORAL | 0 refills | Status: DC | PRN

## 2018-05-13 NOTE — Progress Notes (Signed)
Patient: [name] [name] Male    DOB: [birth year]   42 y.o.   MRN: [id number] Visit Date: 05/18/2018  Today's Provider: [name], PA-C   Chief Complaint  Patient presents with  . Follow-up    HTN.DM,HLD   Subjective:    HPI  Diabetes Mellitus Type II, Follow-up:   Lab Results  Component Value Date   HGBA1C 7.9 (H) 04/11/2018   HGBA1C 6.6 (H) 11/23/2011   Last seen for diabetes 4 weeks ago.  Management since then includes titrated Metformin 1000 mg BID. He reports excellent compliance with treatment. He is not having side effects.  Current symptoms include none and have been stable. Home blood sugar records: fasting range: 80-100. Requesting new meter.   Episodes of hypoglycemia? no   Current Insulin Regimen: none Most Recent Eye Exam: scheduled 05/20/18 Weight trend: Decreasing steadily.  Prior visit with dietician: no Current diet: well balanced. Eating better, reducing sugars, using portion control.  Current exercise: walking. Exercise includes walking in place, push ups.   Wt Readings from Last 3 Encounters:  05/13/18 282 lb (127.9 kg)  04/11/18 298 lb (135.2 kg)  03/23/18 280 lb (127 kg)     ------------------------------------------------------------------------   Hypertension, follow-up:  BP Readings from Last 3 Encounters:  05/13/18 (!) 146/96  04/11/18 110/74  03/23/18 (!) 154/96    He was last seen for hypertension 4 weeks ago.  BP at that visit was 110/74 Management since that visit includes change Lisinopril to Amlodipine 5 mg due to possible angioedema and continue Metoprolol.He reports excellent compliance with treatment. He is not having side effects.  He is exercising. He is adherent to low salt diet.   Outside blood pressures are n/a. He is experiencing none.  Patient denies chest pain, chest pressure/discomfort, exertional chest pressure/discomfort, fatigue, irregular heart beat, lower extremity edema, near-syncope and  palpitations.   Cardiovascular risk factors include diabetes mellitus, dyslipidemia, hypertension, male gender and smoking/ tobacco exposure.  Use of agents associated with hypertension: none.   ------------------------------------------------------------------------    Lipid/Cholesterol, Follow-up:   Last seen for this 4 weeks ago.  Management since that visit includes Atorvastatin 80 mg.  Last Lipid Panel:    Component Value Date/Time   CHOL 218 (H) 04/11/2018 1532   TRIG 497 (H) 04/11/2018 1532   HDL 33 (L) 04/11/2018 1532   CHOLHDL 6.6 (H) 04/11/2018 1532   CHOLHDL 4 11/23/2011 1026   VLDL 40.0 11/23/2011 1026   LDLCALC Comment 04/11/2018 1532   LDLDIRECT 115 (H) 04/11/2018 1532    He reports excellent compliance with treatment. He is not having side effects.   Wt Readings from Last 3 Encounters:  05/13/18 282 lb (127.9 kg)  04/11/18 298 lb (135.2 kg)  03/23/18 280 lb (127 kg)   Rectal Bleeding: He had to cancel colonoscopy for this and has not been contacted to reschedule.   Shoulder Pain: Has bilateral shoulder pain, especially after he works out. He is also requesting refill on his muscle relaxer for back pain.  Low Back Pain: Has bilateral low back pain x several months at least. He had a lumbar spine xray in 04/2017 which showed some DDD and bilateral L5 pars defects but was otherwise normal. He reports significant pain associated with this that has not responded to conservative therapy including NSAIDs. He is trying to lose weight.   ------------------------------------------------------------------------     Allergies  Allergen Reactions  . Penicillins      Current Outpatient Medications:  .  aspirin 81 MG tablet, Take 81 mg by mouth daily. , Disp: , Rfl:  .  atorvastatin (LIPITOR) 80 MG tablet, Take 1 tablet (80 mg total) by mouth daily., Disp: 90 tablet, Rfl: 0 .  cyclobenzaprine (FLEXERIL) 5 MG tablet, Take 1 tablet (5 mg total) by mouth 2 (two)  times daily as needed for muscle spasms., Disp: 20 tablet, Rfl: 0 .  metFORMIN (GLUCOPHAGE) 500 MG tablet, Take 2 tablets (1,000 mg total) by mouth daily with breakfast., Disp: 180 tablet, Rfl: 0 .  metoprolol succinate (TOPROL-XL) 25 MG 24 hr tablet, Take 1 tablet (25 mg total) by mouth at bedtime., Disp: 90 tablet, Rfl: 0 .  nitroGLYCERIN (NITROSTAT) 0.4 MG SL tablet, Place 1 tablet (0.4 mg total) under the tongue every 5 (five) minutes as needed for chest pain. Maximum of 3 doses., Disp: 25 tablet, Rfl: 4 .  ondansetron (ZOFRAN ODT) 4 MG disintegrating tablet, 4mg  ODT q4 hours prn nausea/vomit, Disp: 8 tablet, Rfl: 0 .  pantoprazole (PROTONIX) 40 MG tablet, Take 1 tablet (40 mg total) by mouth daily., Disp: 90 tablet, Rfl: 0 .  QUEtiapine (SEROQUEL XR) 50 MG TB24 24 hr tablet, Take 1 tablet (50 mg total) by mouth at bedtime., Disp: 90 each, Rfl: 0 .  amLODipine (NORVASC) 10 MG tablet, Take 1 tablet (10 mg total) by mouth daily., Disp: 90 tablet, Rfl: 0  Review of Systems   12 point ROS Negative except for HPI.   Social History   Tobacco Use  . Smoking status: Former Smoker    Packs/day: 1.00    Years: 14.00    Pack years: 14.00    Types: Cigarettes    Last attempt to quit: 06/2017    Years since quitting: 0.9  . Smokeless tobacco: Never Used  Substance Use Topics  . Alcohol use: Yes    Alcohol/week: 10.0 standard drinks    Types: 2 Cans of beer, 8 Shots of liquor per week   Objective:   BP (!) 146/96 (BP Location: Right Arm, Patient Position: Sitting, Cuff Size: Large)   Pulse 73   Temp 97.7 F (36.5 C) (Oral)   Resp 16   Wt 282 lb (127.9 kg)   BMI 35.25 kg/m  Vitals:   05/13/18 1012  BP: (!) 146/96  Pulse: 73  Resp: 16  Temp: 97.7 F (36.5 C)  TempSrc: Oral  Weight: 282 lb (127.9 kg)     Physical Exam  Constitutional: He is oriented to person, place, and time. He appears well-developed and well-nourished.  Cardiovascular: Normal rate and regular rhythm.    Pulmonary/Chest: Effort normal and breath sounds normal.  Musculoskeletal: Normal range of motion. He exhibits no edema, tenderness or deformity.  No impingement signs bilaterally.   Neurological: He is alert and oriented to person, place, and time.  Skin: Skin is warm and dry.  Psychiatric: He has a normal mood and affect. His behavior is normal.        Assessment & Plan:     1. Type 2 diabetes mellitus with hyperglycemia, without long-term current use of insulin (HCC)  Fasting sugars have improved and he is tolerating metformin well.  He needs to update eye exam and has appt with Dr. Neville Route at [facility] on 05/20/2018.   - Urine Microalbumin w/creat. ratio - amLODipine (NORVASC) 10 MG tablet; Take 1 tablet (10 mg total) by mouth daily.  Dispense: 90 tablet; Refill: 0 - Pneumococcal polysaccharide vaccine 23-valent greater than or equal to 2yo subcutaneous/IM -  POCT UA - Microalbumin  2. Essential hypertension  Increase amlodipine to 10 mg daily. Continue metoprolol succinate 25 mg daily.  - amLODipine (NORVASC) 10 MG tablet; Take 1 tablet (10 mg total) by mouth daily.  Dispense: 90 tablet; Refill: 0  3. Muscle spasm  - Ambulatory referral to Orthopedics - cyclobenzaprine (FLEXERIL) 5 MG tablet; Take 1 tablet (5 mg total) by mouth 2 (two) times daily as needed for muscle spasms.  Dispense: 20 tablet; Refill: 0  4. Acute midline low back pain without sciatica  He wants to be referred for continued back pain.  - Ambulatory referral to Orthopedics  5. Need for pneumococcal vaccination  Initially agreed to this while I was in the room but apparently declined it when I left. He will need to get this at his next visit, it is standard of care for diabetes.   Return in about 1 month (around 06/13/2018) for HTN .  The entirety of the information documented in the History of Present Illness, Review of Systems and Physical Exam were personally obtained by me. Portions of  this information were initially documented by Lyndel Pleasure, CMA and reviewed by me for thoroughness and accuracy.           [name], PA-C  [facility]

## 2018-05-14 LAB — MICROALBUMIN / CREATININE URINE RATIO
Creatinine, Urine: 233.1 mg/dL
Microalb/Creat Ratio: 5.7 mg/g creat (ref 0.0–30.0)
Microalbumin, Urine: 13.3 ug/mL

## 2018-06-13 ENCOUNTER — Ambulatory Visit: Payer: Self-pay | Admitting: Physician Assistant

## 2018-06-17 ENCOUNTER — Ambulatory Visit (INDEPENDENT_AMBULATORY_CARE_PROVIDER_SITE_OTHER): Admitting: Physician Assistant

## 2018-06-17 ENCOUNTER — Encounter: Payer: Self-pay | Admitting: Physician Assistant

## 2018-06-17 VITALS — BP 148/86 | HR 69 | Temp 97.7°F | Wt 281.0 lb

## 2018-06-17 DIAGNOSIS — Z23 Encounter for immunization: Secondary | ICD-10-CM

## 2018-06-17 DIAGNOSIS — E1142 Type 2 diabetes mellitus with diabetic polyneuropathy: Secondary | ICD-10-CM | POA: Diagnosis not present

## 2018-06-17 DIAGNOSIS — I10 Essential (primary) hypertension: Secondary | ICD-10-CM

## 2018-06-17 DIAGNOSIS — E785 Hyperlipidemia, unspecified: Secondary | ICD-10-CM

## 2018-06-17 DIAGNOSIS — E1169 Type 2 diabetes mellitus with other specified complication: Secondary | ICD-10-CM | POA: Diagnosis not present

## 2018-06-17 MED ORDER — METFORMIN HCL 1000 MG PO TABS: 1000.0000 mg | ORAL_TABLET | Freq: Two times a day (BID) | ORAL | 3 refills | Status: DC

## 2018-06-17 NOTE — Patient Instructions (Addendum)
Take 1000 mg metformin twice daily to total 2000 mg daily.

## 2018-06-17 NOTE — Progress Notes (Signed)
Patient: [name] [name] Male    DOB: 01/05/1976   43 y.o.   MRN: [id number] Visit Date: 06/21/2018  Today's Provider: [name], PA-C   Chief Complaint  Patient presents with  . Diabetes  . Hypertension  . Hyperlipidemia   Subjective:    HPI  Diabetes Mellitus Type II, Follow-up:   Lab Results  Component Value Date   HGBA1C 7.9 (H) 04/11/2018   HGBA1C 6.6 (H) 11/23/2011   Last seen for diabetes 1 months ago. Sugars 113 in the morning  Management since then includes: patient states he is taking his medication regularly. He reports good compliance with treatment. He is not having side effects.  Current symptoms include none and have been improving. Home blood sugar records: none  Episodes of hypoglycemia? no   Current Insulin Regimen: none Most Recent Eye Exam: Not yet completed.  Weight trend: stable Prior visit with dietician: no Current diet: well balanced Current exercise: walking  He is due for a pneumonia shot. He initially agreed last visit and then did not get vaccination when medical assistant came to administer.   ------------------------------------------------------------------------   Hypertension, follow-up:  BP Readings from Last 3 Encounters:  06/17/18 (!) 148/86  05/13/18 (!) 146/96  04/11/18 110/74    He was last seen for hypertension 1 months ago.  BP at that visit was 146/96. Management since that visit includes .He reports good compliance with treatment. He is not having side effects.  He is exercising. He is not adherent to low salt diet.   Outside blood pressures are 130's/70's. He is experiencing none.  Patient denies chest pain, chest pressure/discomfort, fatigue, irregular heart beat and palpitations.   Cardiovascular risk factors include diabetes mellitus, hypertension and male gender.  Use of agents associated with hypertension: none.    ------------------------------------------------------------------------    Lipid/Cholesterol, Follow-up:   Last seen for this 1 months ago.  Management since that visit includes Taking 80 mg lipitor.  Last Lipid Panel:    Component Value Date/Time   CHOL 218 (H) 04/11/2018 1532   TRIG 497 (H) 04/11/2018 1532   HDL 33 (L) 04/11/2018 1532   CHOLHDL 6.6 (H) 04/11/2018 1532   CHOLHDL 4 11/23/2011 1026   VLDL 40.0 11/23/2011 1026   LDLCALC Comment 04/11/2018 1532   LDLDIRECT 115 (H) 04/11/2018 1532    He reports good compliance with treatment. He is not having side effects.   Wt Readings from Last 3 Encounters:  06/17/18 281 lb (127.5 kg)  05/13/18 282 lb (127.9 kg)  04/11/18 298 lb (135.2 kg)    ------------------------------------------------------------------------     Allergies  Allergen Reactions  . Penicillins      Current Outpatient Medications:  .  amLODipine (NORVASC) 10 MG tablet, Take 1 tablet (10 mg total) by mouth daily., Disp: 90 tablet, Rfl: 0 .  aspirin 81 MG tablet, Take 81 mg by mouth daily. , Disp: , Rfl:  .  atorvastatin (LIPITOR) 80 MG tablet, Take 1 tablet (80 mg total) by mouth daily., Disp: 90 tablet, Rfl: 0 .  cyclobenzaprine (FLEXERIL) 5 MG tablet, Take 1 tablet (5 mg total) by mouth 2 (two) times daily as needed for muscle spasms., Disp: 20 tablet, Rfl: 0 .  metoprolol succinate (TOPROL-XL) 25 MG 24 hr tablet, Take 1 tablet (25 mg total) by mouth at bedtime., Disp: 90 tablet, Rfl: 0 .  nitroGLYCERIN (NITROSTAT) 0.4 MG SL tablet, Place 1 tablet (0.4 mg total) under the tongue every 5 (five)  minutes as needed for chest pain. Maximum of 3 doses., Disp: 25 tablet, Rfl: 4 .  ondansetron (ZOFRAN ODT) 4 MG disintegrating tablet, 4mg  ODT q4 hours prn nausea/vomit, Disp: 8 tablet, Rfl: 0 .  pantoprazole (PROTONIX) 40 MG tablet, Take 1 tablet (40 mg total) by mouth daily., Disp: 90 tablet, Rfl: 0 .  QUEtiapine (SEROQUEL XR) 50 MG TB24 24 hr tablet,  Take 1 tablet (50 mg total) by mouth at bedtime., Disp: 90 each, Rfl: 0 .  metFORMIN (GLUCOPHAGE) 1000 MG tablet, Take 1 tablet (1,000 mg total) by mouth 2 (two) times daily with a meal., Disp: 180 tablet, Rfl: 3  Review of Systems  Constitutional: Negative.   HENT: Negative.   Respiratory: Negative.   Cardiovascular: Negative.   Neurological: Negative.     Social History   Tobacco Use  . Smoking status: Former Smoker    Packs/day: 1.00    Years: 14.00    Pack years: 14.00    Types: Cigarettes    Last attempt to quit: 06/2017    Years since quitting: 1.0  . Smokeless tobacco: Never Used  Substance Use Topics  . Alcohol use: Yes    Alcohol/week: 10.0 standard drinks    Types: 2 Cans of beer, 8 Shots of liquor per week      Objective:   BP (!) 148/86 (BP Location: Left Arm, Patient Position: Sitting, Cuff Size: Large)   Pulse 69   Temp 97.7 F (36.5 C) (Oral)   Wt 281 lb (127.5 kg)   SpO2 99%   BMI 35.12 kg/m  Vitals:   06/17/18 0815 06/17/18 0902  BP: (!) 149/96 (!) 148/86  Pulse: 69   Temp: 97.7 F (36.5 C)   TempSrc: Oral   SpO2: 99%   Weight: 281 lb (127.5 kg)      Physical Exam      Assessment & Plan    1. Type 2 diabetes mellitus with diabetic polyneuropathy, without long-term current use of insulin (HCC)  Increasing metformin to 1000 mg twice daily. Recheck a1c next visit.  - metFORMIN (GLUCOPHAGE) 1000 MG tablet; Take 1 tablet (1,000 mg total) by mouth 2 (two) times daily with a meal.  Dispense: 180 tablet; Refill: 3  2. Need for vaccination for Strep pneumoniae  I will continue to ask him about this at each visit.  3. Essential hypertension  Not controlled in clinic though patient reports readings at home are normal. Will check again next visit and adjust medications if still elevated.   4. Hyperlipidemia associated with type 2 diabetes mellitus (HCC)  Taking 80 mg lipitor. Recheck labs next visit.   Return in about 2 months (around  08/16/2018) for DM, HTN .  The entirety of the information documented in the History of Present Illness, Review of Systems and Physical Exam were personally obtained by me. Portions of this information were initially documented by Hurman Horn, CMA and reviewed by me for thoroughness and accuracy.        [name], PA-C  [facility]

## 2018-06-20 DIAGNOSIS — M542 Cervicalgia: Secondary | ICD-10-CM | POA: Diagnosis not present

## 2018-06-20 DIAGNOSIS — M62830 Muscle spasm of back: Secondary | ICD-10-CM | POA: Diagnosis not present

## 2018-06-20 DIAGNOSIS — M5416 Radiculopathy, lumbar region: Secondary | ICD-10-CM | POA: Diagnosis not present

## 2018-06-20 DIAGNOSIS — G8929 Other chronic pain: Secondary | ICD-10-CM | POA: Diagnosis not present

## 2018-06-20 DIAGNOSIS — M50322 Other cervical disc degeneration at C5-C6 level: Secondary | ICD-10-CM | POA: Diagnosis not present

## 2018-06-20 DIAGNOSIS — M25512 Pain in left shoulder: Secondary | ICD-10-CM | POA: Diagnosis not present

## 2018-06-20 DIAGNOSIS — M5136 Other intervertebral disc degeneration, lumbar region: Secondary | ICD-10-CM | POA: Diagnosis not present

## 2018-06-23 ENCOUNTER — Ambulatory Visit: Admitting: Internal Medicine
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
## 2018-06-24 ENCOUNTER — Encounter: Payer: Self-pay | Admitting: Physician Assistant

## 2018-06-24 ENCOUNTER — Ambulatory Visit (INDEPENDENT_AMBULATORY_CARE_PROVIDER_SITE_OTHER): Admitting: Physician Assistant

## 2018-06-24 VITALS — BP 120/90 | HR 64 | Ht 75.0 in | Wt 282.0 lb

## 2018-06-24 DIAGNOSIS — E785 Hyperlipidemia, unspecified: Secondary | ICD-10-CM | POA: Diagnosis not present

## 2018-06-24 DIAGNOSIS — I10 Essential (primary) hypertension: Secondary | ICD-10-CM

## 2018-06-24 DIAGNOSIS — I2510 Atherosclerotic heart disease of native coronary artery without angina pectoris: Secondary | ICD-10-CM

## 2018-06-24 NOTE — Patient Instructions (Signed)
Medication Instructions:  No changes If you need a refill on your cardiac medications before your next appointment, please call your pharmacy.   Lab work: Your provider would like for you to have the following labs today: CMET, Lipid and Direct LDL  If you have labs (blood work) drawn today and your tests are completely normal, you will receive your results only by: [name] MyChart Message (if you have MyChart) OR . A paper copy in the mail If you have any lab test that is abnormal or we need to change your treatment, we will call you to review the results.  Testing/Procedures: None ordered  Follow-Up: At [facility], you and your health needs are our priority.  As part of our continuing mission to provide you with exceptional heart care, we have created designated Provider Care Teams.  These Care Teams include your primary Cardiologist (physician) and Advanced Practice Providers (APPs -  Physician Assistants and Nurse Practitioners) who all work together to provide you with the care you need, when you need it. You will need a follow up appointment in 3 months.  Please call our office 2 months in advance to schedule this appointment.  You may see Dr. Saunders Revel or one of the following Advanced Practice Providers on your designated Care Team:   Murray Hodgkins, NP Christell Faith, PA-C

## 2018-06-25 LAB — LIPID PANEL
Chol/HDL Ratio: 4.4 ratio (ref 0.0–5.0)
Cholesterol, Total: 201 mg/dL — ABNORMAL HIGH (ref 100–199)
HDL: 46 mg/dL (ref >39)
LDL Calculated: 119 mg/dL — ABNORMAL HIGH (ref 0–99)
Triglycerides: 181 mg/dL — ABNORMAL HIGH (ref 0–149)
VLDL Cholesterol Cal: 36 mg/dL (ref 5–40)

## 2018-06-25 LAB — LDL CHOLESTEROL, DIRECT: LDL Direct: 129 mg/dL — ABNORMAL HIGH (ref 0–99)

## 2018-06-25 LAB — COMPREHENSIVE METABOLIC PANEL
ALT: 31 IU/L (ref 0–44)
AST: 17 IU/L (ref 0–40)
Albumin/Globulin Ratio: 1.8 (ref 1.2–2.2)
Albumin: 4.9 g/dL (ref 3.5–5.5)
Alkaline Phosphatase: 82 IU/L (ref 39–117)
BUN / CREAT RATIO: 9 (ref 9–20)
BUN: 10 mg/dL (ref 6–24)
Bilirubin Total: 0.4 mg/dL (ref 0.0–1.2)
CO2: 19 mmol/L — ABNORMAL LOW (ref 20–29)
Calcium: 10.4 mg/dL — ABNORMAL HIGH (ref 8.7–10.2)
Chloride: 103 mmol/L (ref 96–106)
Creatinine, Ser: 1.09 mg/dL (ref 0.76–1.27)
GFR calc Af Amer: 98 mL/min/{1.73_m2} (ref >59)
GFR calc non Af Amer: 84 mL/min/{1.73_m2} (ref >59)
GLOBULIN, TOTAL: 2.8 g/dL (ref 1.5–4.5)
Glucose: 121 mg/dL — ABNORMAL HIGH (ref 65–99)
Potassium: 4.3 mmol/L (ref 3.5–5.2)
Sodium: 141 mmol/L (ref 134–144)
Total Protein: 7.7 g/dL (ref 6.0–8.5)

## 2018-06-27 ENCOUNTER — Telehealth: Payer: Self-pay

## 2018-06-27 DIAGNOSIS — E785 Hyperlipidemia, unspecified: Secondary | ICD-10-CM

## 2018-06-27 DIAGNOSIS — Z79899 Other long term (current) drug therapy: Secondary | ICD-10-CM

## 2018-06-27 NOTE — Telephone Encounter (Signed)
Attempted to call patient. LMTCB 1/20  

## 2018-06-27 NOTE — Telephone Encounter (Signed)
-----   Message from Rise Mu, PA-C sent at 06/26/2018  2:25 PM EST ----- Renal function normal.  Random glucose ok.  Calcium is mildly elevated and should be followed up on by his PCP.  Liver function is normal.  LDL remains elevated and above goal. Please start taking Zetia 10 mg daily in addition to Lipitor.  Recheck fasting lipid panel and liver function in ~ 8 weeks.

## 2018-06-28 NOTE — Telephone Encounter (Signed)
Attempted to call patient. LMTCB 1/21 

## 2018-06-29 ENCOUNTER — Encounter: Payer: Self-pay | Admitting: *Deleted

## 2018-06-29 MED ORDER — EZETIMIBE 10 MG PO TABS: 10.0000 mg | ORAL_TABLET | Freq: Every day | ORAL | 3 refills | Status: DC

## 2018-06-29 NOTE — Telephone Encounter (Signed)
Results called to pt. Pt verbalized understanding. Rx sent to pharmacy. Lab orders entered. Patient aware to start medication and to be fasting for lab work around March 18th at the [facility].  Letter mailed with results and instructions per patient request as well.

## 2018-07-08 DIAGNOSIS — M5136 Other intervertebral disc degeneration, lumbar region: Secondary | ICD-10-CM | POA: Diagnosis not present

## 2018-07-08 DIAGNOSIS — M5416 Radiculopathy, lumbar region: Secondary | ICD-10-CM | POA: Diagnosis not present

## 2018-07-22 ENCOUNTER — Ambulatory Visit: Payer: Self-pay | Admitting: Pharmacist

## 2018-07-22 ENCOUNTER — Encounter: Payer: Self-pay | Admitting: Physician Assistant

## 2018-07-22 ENCOUNTER — Ambulatory Visit (INDEPENDENT_AMBULATORY_CARE_PROVIDER_SITE_OTHER): Admitting: Physician Assistant

## 2018-07-22 VITALS — BP 130/88 | HR 73 | Temp 97.6°F | Wt 285.4 lb

## 2018-07-22 DIAGNOSIS — E7800 Pure hypercholesterolemia, unspecified: Secondary | ICD-10-CM

## 2018-07-22 DIAGNOSIS — I10 Essential (primary) hypertension: Secondary | ICD-10-CM

## 2018-07-22 DIAGNOSIS — E1142 Type 2 diabetes mellitus with diabetic polyneuropathy: Secondary | ICD-10-CM | POA: Diagnosis not present

## 2018-07-22 DIAGNOSIS — Z23 Encounter for immunization: Secondary | ICD-10-CM

## 2018-07-22 LAB — POCT GLYCOSYLATED HEMOGLOBIN (HGB A1C): Hemoglobin A1C: 7.5 % — AB (ref 4.0–5.6)

## 2018-07-22 NOTE — Progress Notes (Signed)
Patient: [name] [name] Male    DOB: December 02, 1975   43 y.o.   MRN: [id number] Visit Date: 07/22/2018  Today's Provider: [name], PA-C   Chief Complaint  Patient presents with  . Diabetes  . Hypertension   Subjective:    HPI  Diabetes Mellitus Type II, Follow-up:   Lab Results  Component Value Date   HGBA1C 7.5 (A) 07/22/2018   HGBA1C 7.9 (H) 04/11/2018   HGBA1C 6.6 (H) 11/23/2011   Last seen for diabetes 1 months ago.  Management since then includes increased Metformin to 1000 mg twice daily. He reports good compliance with treatment. He is not having side effects.  Current symptoms include none and have been stable. Home blood sugar records: none  Episodes of hypoglycemia? no   Current Insulin Regimen: none Most Recent Eye Exam: Not yet completed. Weight trend: stable Prior visit with dietician: no Current diet: Eats frequent starches like bread, pasta potatoes. Can drink significant amounts of alcohol. Has decreased exercising since epidural steroid injection.  Current exercise: walking  ------------------------------------------------------------------------   Hypertension, follow-up:  BP Readings from Last 3 Encounters:  07/22/18 130/88  06/24/18 120/90  06/17/18 (!) 148/86    He was last seen for hypertension 1 months ago.  BP at that visit was 148/86. Management since that visit includes none.He reports good compliance with treatment. He is not having side effects.  He is exercising. He is not adherent to low salt diet.   He is experiencing none.  Patient denies chest pain, chest pressure/discomfort, fatigue, irregular heart beat, lower extremity edema, palpitations and tachypnea.   Cardiovascular risk factors include diabetes mellitus, hypertension and male gender.  Use of agents associated with hypertension: none.   Wt Readings from Last 3 Encounters:  07/22/18 285 lb 6.4 oz (129.5 kg)  06/24/18 282 lb (127.9 kg)  06/17/18 281 lb  (127.5 kg)    ------------------------------------------------------------------------      Allergies  Allergen Reactions  . Penicillins      Current Outpatient Medications:  .  amLODipine (NORVASC) 10 MG tablet, Take 1 tablet (10 mg total) by mouth daily., Disp: 90 tablet, Rfl: 0 .  aspirin 81 MG tablet, Take 81 mg by mouth daily. , Disp: , Rfl:  .  cyclobenzaprine (FLEXERIL) 5 MG tablet, Take 1 tablet (5 mg total) by mouth 2 (two) times daily as needed for muscle spasms., Disp: 20 tablet, Rfl: 0 .  ezetimibe (ZETIA) 10 MG tablet, Take 1 tablet (10 mg total) by mouth daily., Disp: 90 tablet, Rfl: 3 .  metFORMIN (GLUCOPHAGE) 1000 MG tablet, Take 1 tablet (1,000 mg total) by mouth 2 (two) times daily with a meal., Disp: 180 tablet, Rfl: 3 .  ondansetron (ZOFRAN ODT) 4 MG disintegrating tablet, 4mg  ODT q4 hours prn nausea/vomit, Disp: 8 tablet, Rfl: 0 .  atorvastatin (LIPITOR) 80 MG tablet, Take 1 tablet (80 mg total) by mouth daily., Disp: 90 tablet, Rfl: 0 .  metoprolol succinate (TOPROL-XL) 25 MG 24 hr tablet, Take 1 tablet (25 mg total) by mouth at bedtime., Disp: 90 tablet, Rfl: 0 .  nitroGLYCERIN (NITROSTAT) 0.4 MG SL tablet, Place 1 tablet (0.4 mg total) under the tongue every 5 (five) minutes as needed for chest pain. Maximum of 3 doses., Disp: 25 tablet, Rfl: 4 .  pantoprazole (PROTONIX) 40 MG tablet, Take 1 tablet (40 mg total) by mouth daily., Disp: 90 tablet, Rfl: 0 .  QUEtiapine (SEROQUEL XR) 50 MG TB24 24 hr tablet,  Take 1 tablet (50 mg total) by mouth at bedtime., Disp: 90 each, Rfl: 0  Review of Systems  HENT: Negative.   Respiratory: Negative.   Genitourinary: Negative.   Neurological: Negative.     Social History   Tobacco Use  . Smoking status: Former Smoker    Packs/day: 1.00    Years: 14.00    Pack years: 14.00    Types: Cigarettes    Last attempt to quit: 06/2017    Years since quitting: 1.1  . Smokeless tobacco: Never Used  Substance Use Topics  .  Alcohol use: Yes    Alcohol/week: 10.0 standard drinks    Types: 2 Cans of beer, 8 Shots of liquor per week      Objective:   BP 130/88 (BP Location: Right Arm, Patient Position: Sitting, Cuff Size: Large)   Pulse 73   Temp 97.6 F (36.4 C) (Oral)   Wt 285 lb 6.4 oz (129.5 kg)   BMI 35.67 kg/m  Vitals:   07/22/18 0907  BP: 130/88  Pulse: 73  Temp: 97.6 F (36.4 C)  TempSrc: Oral  Weight: 285 lb 6.4 oz (129.5 kg)     Physical Exam Constitutional:      Appearance: Normal appearance.  Cardiovascular:     Rate and Rhythm: Normal rate and regular rhythm.     Heart sounds: Normal heart sounds.  Pulmonary:     Breath sounds: Normal breath sounds.  Neurological:     Mental Status: He is alert and oriented to person, place, and time. Mental status is at baseline.  Psychiatric:        Mood and Affect: Mood normal.        Behavior: Behavior normal.         Assessment & Plan    1. Type 2 diabetes mellitus with diabetic polyneuropathy, without long-term current use of insulin (HCC)  A1c Has improved but is not yet controlled. He is currently on metformin alone. I think he would benefit from an SGLT2 but patient does not wish to do this, he would like to work on lifestyle. I think he needs further diabetes education regarding proper dietary interventions. He also needs to be educated on the necessity of pneumovax as I have counseled this repeatedly and he continues to decline because he does not want his arm to hurt or to be made sick by the vaccine. We will see him back in three months for A1c, at which point if it is not controlled will start new medication.   - POCT glycosylated hemoglobin (Hb A1C) - Ambulatory referral to Chronic Care Management Services  2. Essential hypertension  Controlled, continue current medication.   3. Hypercholesterolemia  Will check lipid panel today, currently on lipitor 80 mg daily.   - Lipid Profile  The entirety of the information  documented in the History of Present Illness, Review of Systems and Physical Exam were personally obtained by me. Portions of this information were initially documented by Lynford Humphrey, CMA and reviewed by me for thoroughness and accuracy.   Return in about 3 months (around 10/20/2018).      [name], PA-C  [facility]

## 2018-07-22 NOTE — Patient Instructions (Signed)
Diabetes Mellitus and Nutrition, Adult  When you have diabetes (diabetes mellitus), it is very important to have healthy eating habits because your blood sugar (glucose) levels are greatly affected by what you eat and drink. Eating healthy foods in the appropriate amounts, at about the same times every day, can help you:  · Control your blood glucose.  · Lower your risk of heart disease.  · Improve your blood pressure.  · Reach or maintain a healthy weight.  Every person with diabetes is different, and each person has different needs for a meal plan. Your health care provider may recommend that you work with a diet and nutrition specialist (dietitian) to make a meal plan that is best for you. Your meal plan may vary depending on factors such as:  · The calories you need.  · The medicines you take.  · Your weight.  · Your blood glucose, blood pressure, and cholesterol levels.  · Your activity level.  · Other health conditions you have, such as heart or kidney disease.  How do carbohydrates affect me?  Carbohydrates, also called carbs, affect your blood glucose level more than any other type of food. Eating carbs naturally raises the amount of glucose in your blood. Carb counting is a method for keeping track of how many carbs you eat. Counting carbs is important to keep your blood glucose at a healthy level, especially if you use insulin or take certain oral diabetes medicines.  It is important to know how many carbs you can safely have in each meal. This is different for every person. Your dietitian can help you calculate how many carbs you should have at each meal and for each snack.  Foods that contain carbs include:  · Bread, cereal, rice, pasta, and crackers.  · Potatoes and corn.  · Peas, beans, and lentils.  · Milk and yogurt.  · Fruit and juice.  · Desserts, such as cakes, cookies, ice cream, and candy.  How does alcohol affect me?  Alcohol can cause a sudden decrease in blood glucose (hypoglycemia),  especially if you use insulin or take certain oral diabetes medicines. Hypoglycemia can be a life-threatening condition. Symptoms of hypoglycemia (sleepiness, dizziness, and confusion) are similar to symptoms of having too much alcohol.  If your health care provider says that alcohol is safe for you, follow these guidelines:  · Limit alcohol intake to no more than 1 drink per day for nonpregnant women and 2 drinks per day for men. One drink equals 12 oz of beer, 5 oz of wine, or 1½ oz of hard liquor.  · Do not drink on an empty stomach.  · Keep yourself hydrated with water, diet soda, or unsweetened iced tea.  · Keep in mind that regular soda, juice, and other mixers may contain a lot of sugar and must be counted as carbs.  What are tips for following this plan?    Reading food labels  · Start by checking the serving size on the "Nutrition Facts" label of packaged foods and drinks. The amount of calories, carbs, fats, and other nutrients listed on the label is based on one serving of the item. Many items contain more than one serving per package.  · Check the total grams (g) of carbs in one serving. You can calculate the number of servings of carbs in one serving by dividing the total carbs by 15. For example, if a food has 30 g of total carbs, it would be equal to 2   servings of carbs.  · Check the number of grams (g) of saturated and trans fats in one serving. Choose foods that have low or no amount of these fats.  · Check the number of milligrams (mg) of salt (sodium) in one serving. Most people should limit total sodium intake to less than 2,300 mg per day.  · Always check the nutrition information of foods labeled as "low-fat" or "nonfat". These foods may be higher in added sugar or refined carbs and should be avoided.  · Talk to your dietitian to identify your daily goals for nutrients listed on the label.  Shopping  · Avoid buying canned, premade, or processed foods. These foods tend to be high in fat, sodium,  and added sugar.  · Shop around the outside edge of the grocery store. This includes fresh fruits and vegetables, bulk grains, fresh meats, and fresh dairy.  Cooking  · Use low-heat cooking methods, such as baking, instead of high-heat cooking methods like deep frying.  · Cook using healthy oils, such as olive, canola, or sunflower oil.  · Avoid cooking with butter, cream, or high-fat meats.  Meal planning  · Eat meals and snacks regularly, preferably at the same times every day. Avoid going long periods of time without eating.  · Eat foods high in fiber, such as fresh fruits, vegetables, beans, and whole grains. Talk to your dietitian about how many servings of carbs you can eat at each meal.  · Eat 4-6 ounces (oz) of lean protein each day, such as lean meat, chicken, fish, eggs, or tofu. One oz of lean protein is equal to:  ? 1 oz of meat, chicken, or fish.  ? 1 egg.  ? ¼ cup of tofu.  · Eat some foods each day that contain healthy fats, such as avocado, nuts, seeds, and fish.  Lifestyle  · Check your blood glucose regularly.  · Exercise regularly as told by your health care provider. This may include:  ? 150 minutes of moderate-intensity or vigorous-intensity exercise each week. This could be brisk walking, biking, or water aerobics.  ? Stretching and doing strength exercises, such as yoga or weightlifting, at least 2 times a week.  · Take medicines as told by your health care provider.  · Do not use any products that contain nicotine or tobacco, such as cigarettes and e-cigarettes. If you need help quitting, ask your health care provider.  · Work with a counselor or diabetes educator to identify strategies to manage stress and any emotional and social challenges.  Questions to ask a health care provider  · Do I need to meet with a diabetes educator?  · Do I need to meet with a dietitian?  · What number can I call if I have questions?  · When are the best times to check my blood glucose?  Where to find more  information:  · American Diabetes Association: diabetes.org  · Academy of Nutrition and Dietetics: www.eatright.org  · National Institute of Diabetes and Digestive and Kidney Diseases (NIH): www.niddk.nih.gov  Summary  · A healthy meal plan will help you control your blood glucose and maintain a healthy lifestyle.  · Working with a diet and nutrition specialist (dietitian) can help you make a meal plan that is best for you.  · Keep in mind that carbohydrates (carbs) and alcohol have immediate effects on your blood glucose levels. It is important to count carbs and to use alcohol carefully.  This information is not intended to   replace advice given to you by your health care provider. Make sure you discuss any questions you have with your health care provider.  Document Released: 02/19/2005 Document Revised: 12/23/2016 Document Reviewed: 06/29/2016  Elsevier Interactive Patient Education © 2019 Elsevier Inc.

## 2018-07-22 NOTE — Chronic Care Management (AMB) (Signed)
  Care Management   Note  07/22/2018 Name: [name] [name] MRN: [id number] DOB: 1975/06/28   43 y.o. year old male referred to Chronic Care Management by [name]- PAC for diabetes management, diet education, and vaccination counseling. Last office visit with [name], PA-C was 07/22/18.   Was unable to reach patient via telephone today and have left HIPAA compliant voicemail asking patient to return my call. (unsuccessful outreach #1).     Follow Up Plan: The CM team will reach out to the patient again over the next 7 days.   Ruben Reason, PharmD Clinical Pharmacist [place] [phone]

## 2018-07-23 LAB — LIPID PANEL
Chol/HDL Ratio: 3.4 ratio (ref 0.0–5.0)
Cholesterol, Total: 164 mg/dL (ref 100–199)
HDL: 48 mg/dL (ref >39)
LDL Calculated: 88 mg/dL (ref 0–99)
Triglycerides: 139 mg/dL (ref 0–149)
VLDL Cholesterol Cal: 28 mg/dL (ref 5–40)

## 2018-07-25 ENCOUNTER — Other Ambulatory Visit: Payer: Self-pay | Admitting: Physician Assistant

## 2018-07-25 DIAGNOSIS — I10 Essential (primary) hypertension: Secondary | ICD-10-CM

## 2018-07-28 ENCOUNTER — Other Ambulatory Visit: Payer: Self-pay | Admitting: Physician Assistant

## 2018-07-28 DIAGNOSIS — E1142 Type 2 diabetes mellitus with diabetic polyneuropathy: Secondary | ICD-10-CM

## 2018-07-28 MED ORDER — METFORMIN HCL 1000 MG PO TABS: 1000.0000 mg | ORAL_TABLET | Freq: Two times a day (BID) | ORAL | 3 refills | Status: DC

## 2018-07-28 NOTE — Telephone Encounter (Signed)
Pt needing a refill on: metoprolol succinate (TOPROL-XL) 25 MG 24 hr tablet  Pharmacy is waiting on approval for refill.  [facility] [place], [address] (Phone) [phone] (Fax)   Thanks, [employer]

## 2018-07-29 ENCOUNTER — Other Ambulatory Visit: Payer: Self-pay | Admitting: Physician Assistant

## 2018-07-29 ENCOUNTER — Ambulatory Visit: Payer: Self-pay

## 2018-07-29 ENCOUNTER — Telehealth: Payer: Self-pay

## 2018-07-29 DIAGNOSIS — E1169 Type 2 diabetes mellitus with other specified complication: Secondary | ICD-10-CM

## 2018-07-29 DIAGNOSIS — I10 Essential (primary) hypertension: Secondary | ICD-10-CM

## 2018-07-29 DIAGNOSIS — E1142 Type 2 diabetes mellitus with diabetic polyneuropathy: Secondary | ICD-10-CM

## 2018-07-29 DIAGNOSIS — E785 Hyperlipidemia, unspecified: Secondary | ICD-10-CM

## 2018-07-29 MED ORDER — ROSUVASTATIN CALCIUM 40 MG PO TABS: 40.0000 mg | ORAL_TABLET | Freq: Every day | ORAL | 1 refills | Status: DC

## 2018-07-29 NOTE — Telephone Encounter (Signed)
lmtcb

## 2018-07-29 NOTE — Patient Instructions (Addendum)
1. Thank You for allowing the CCM (Chronic Care Management) Team to assist you with your healthcare goals!! We look forward to meeting you on 08/15/2018 at 2:00 2. Please bring ALL medications to your appointment! If you have a blood sugar meter or a blood pressure monitor at home, bring those as well.  3.  Contact the CCM Team if you have any question or need to reschedule your initial visit.  CCM (Chronic Care Management) Team   Trish Fountain RN, BSN Nurse Care Coordinator  [phone]  Ruben Reason PharmD  Clinical Pharmacist  [phone]

## 2018-07-29 NOTE — Telephone Encounter (Signed)
-----   Message from [name], [place] sent at 07/29/2018  2:59 PM EST ----- Cholesterol slightly above goal for person with diabetes. Recommend switching from lipitor 80 mg to crestor 40 mg nightly.

## 2018-07-29 NOTE — Chronic Care Management (AMB) (Signed)
  Care Management   Note  07/29/2018 Name: [name] [name] MRN: [id number] DOB: [birth year]  [name] [name] is a 43 year old male patient who sees [name], [place] for primary care. Ms. Terrilee Croak asked the CCM team to consult the patient for chronic care management related to diabetes education and self care. Patient has a history of but not limited to CAD, DM, HTN, and Hypercholesterolemia. Referral was placed 07/22/2018. Patient's last office visit was 07/22/2018.  Telephone outreach to patient today to introduce CCM services.  Unfortunately [name] was unavailable to speak. Wife requesting information on reason for call. Tiffany Talarico is listed on DPR. Ms. Rondinelli was introduced to the chronic care management services. She requested to schedule a face to face visit with the CCM Team to "hold a spot" She would discuss with her husband and notify CCM RN CM if patient declines appointment.  Patient will be consented prior to initiation of face to face services.  Plan: Patient and wife will meet with the CCM Team 08/15/2018 at 2:00   E. Rollene Rotunda, RN, BSN Nurse Care Coordinator [facility] Care Management [phone]

## 2018-08-01 NOTE — Telephone Encounter (Signed)
lmtcb

## 2018-08-02 NOTE — Telephone Encounter (Signed)
lmtcb

## 2018-08-09 NOTE — Telephone Encounter (Signed)
Patient advised as directed below. Per patient he got a new phone and didn't recognize the numbers.

## 2018-08-12 ENCOUNTER — Other Ambulatory Visit: Payer: Self-pay | Admitting: Physician Assistant

## 2018-08-12 DIAGNOSIS — K219 Gastro-esophageal reflux disease without esophagitis: Secondary | ICD-10-CM

## 2018-08-15 ENCOUNTER — Ambulatory Visit: Admitting: Pharmacist

## 2018-08-15 ENCOUNTER — Ambulatory Visit

## 2018-08-15 DIAGNOSIS — E1142 Type 2 diabetes mellitus with diabetic polyneuropathy: Secondary | ICD-10-CM

## 2018-08-15 DIAGNOSIS — E785 Hyperlipidemia, unspecified: Secondary | ICD-10-CM

## 2018-08-15 DIAGNOSIS — E7800 Pure hypercholesterolemia, unspecified: Secondary | ICD-10-CM

## 2018-08-15 DIAGNOSIS — E1169 Type 2 diabetes mellitus with other specified complication: Secondary | ICD-10-CM

## 2018-08-15 DIAGNOSIS — I10 Essential (primary) hypertension: Secondary | ICD-10-CM

## 2018-08-15 NOTE — Patient Instructions (Addendum)
Thank you allowing the Chronic Care Management Team to be a part of your care! It was a pleasure speaking with you today!  1. Take all medication's as prescribed and do not skip doses. You will feel sluggish until your body get used to the metoprolol. 2. Continue to check BP daily. You may want to check your BP at different times during the day. Please record and notify CCM Team if your BP is consistently >140/90 3. Limit your salt intake and foods that we discussed today. 4. Please review all written materials provided to you and be ready to discuss in 2 weeks. 5. Please DO NOT SKIP MEALS 6. Continue to work out. You need to do some form of aerobic exercise every day. 7. Increase your Metformin to twice a day.  CCM (Chronic Care Management) Team   Trish Fountain RN, BSN Nurse Care Coordinator  [phone]  Ruben Reason PharmD  Clinical Pharmacist  [phone]   Elliot Gurney, LCSW Clinical Social Worker [phone]  Goals Addressed            This Visit's Progress   . "I have had heart disease and high blood pressure since 2013" (pt-stated)       Current Barriers:  [name] Knowledge Deficits related to basic understanding of hypertension pathophysiology and self care management . Non-adherence to prescribed medication regimine . Difficulty obtaining medications  Nurse Case Manager Clinical Goal(s):  [name] Over the next 14 days, patient will verbalize understanding of plan for hypertension management . Over the next 14 days, patient will demonstrate improved adherence to prescribed treatment plan for hypertension as evidenced by taking all medications as prescribed, monitoring and recording blood pressure as directed, adhering to low sodium/DASH diet . Over the next 14 days, patient will demonstrate improved health management independence as evidenced by checking blood pressure as directed and notifying PCP if SBP>140 or DBP > 90, taking all medications as prescribe, and  adhering to a low sodium diet as discussed.  Interventions:  . Evaluation of current treatment plan related to hypertension self management and patient's adherence to plan as established by provider. . Provided education to patient re: stroke prevention, s/s of heart attack and stroke, DASH diet, complications of uncontrolled blood pressure . Reviewed medications with patient and discussed importance of compliance . Discussed plans with patient for ongoing care management follow up and provided patient with direct contact information for care management team . Advised patient, providing education and rationale, to monitor blood pressure daily and record, calling PCP for findings outside established parameters.  . Reviewed scheduled/upcoming provider appointments including:   Patient Self Care Activities:  . Self administers medications as prescribed . Attends all scheduled provider appointments . Calls provider office for new concerns, questions, or BP outside discussed parameters . Checks BP and records as discussed . Follows a low sodium diet/DASH diet  Plan:  . RNCM will follow up with patient in 2 weeks   Initial goal documentation     . I quit taking my seroquel because it make me gain weight (pt-stated)       Current Barriers:  [name] Knowledge Deficits related to how to access mental health provider in [facility]  . Knowledge Deficits related to understanding importance of managing depression and anxiety as it relates to overall wellbeing  Nurse Case Manager Clinical Goal(s):  [name] Over the next 30 days, patient will verbalize understanding of plan for management of depression and anxiety  Interventions:  . Collaborated  with [name], PA-C regarding patients history of mental illness and desire to resume medical care  . Discussed plans with patient for ongoing care management follow up and provided patient with direct contact information for care management team  Patient  Self Care Activities:  . Self administers medications as prescribed . Attends all scheduled provider appointments . Calls provider office for new concerns or questions  Plan:  . RNCM will Colloborate with PCP to establish plan of care related to mental health   Initial goal documentation      . Patient Stated (pt-stated)         Print copy of patient instructions provided.   Face to Face appointment with CCM team member scheduled foR:  2 weeks  Mr. Calender was given information about Care Management services today including:  1. Case Management services includes personalized support from designated clinical staff supervised by his physician, including individualized plan of care and coordination with other care providers 2. 24/7 contact phone numbers for assistance for urgent and routine care needs. 3. The patient may stop case management services at any time by phone call to the office staff.  Patient agreed to services and verbal consent obtained.     DASH Eating Plan DASH stands for "Dietary Approaches to Stop Hypertension." The DASH eating plan is a healthy eating plan that has been shown to reduce high blood pressure (hypertension). It may also reduce your risk for type 2 diabetes, heart disease, and stroke. The DASH eating plan may also help with weight loss. What are tips for following this plan?  General guidelines  Avoid eating more than 2,300 mg (milligrams) of salt (sodium) a day. If you have hypertension, you may need to reduce your sodium intake to 1,500 mg a day.  Limit alcohol intake to no more than 1 drink a day for nonpregnant women and 2 drinks a day for men. One drink equals 12 oz of beer, 5 oz of wine, or 1 oz of hard liquor.  Work with your health care provider to maintain a healthy body weight or to lose weight. Ask what an ideal weight is for you.  Get at least 30 minutes of exercise that causes your heart to beat faster (aerobic exercise) most days  of the week. Activities may include walking, swimming, or biking.  Work with your health care provider or diet and nutrition specialist (dietitian) to adjust your eating plan to your individual calorie needs. Reading food labels   Check food labels for the amount of sodium per serving. Choose foods with less than 5 percent of the Daily Value of sodium. Generally, foods with less than 300 mg of sodium per serving fit into this eating plan.  To find whole grains, look for the word "whole" as the first word in the ingredient list. Shopping  Buy products labeled as "low-sodium" or "no salt added."  Buy fresh foods. Avoid canned foods and premade or frozen meals. Cooking  Avoid adding salt when cooking. Use salt-free seasonings or herbs instead of table salt or sea salt. Check with your health care provider or pharmacist before using salt substitutes.  Do not fry foods. Cook foods using healthy methods such as baking, boiling, grilling, and broiling instead.  Cook with heart-healthy oils, such as olive, canola, soybean, or sunflower oil. Meal planning  Eat a balanced diet that includes: ? 5 or more servings of fruits and vegetables each day. At each meal, try to fill half of your plate with  fruits and vegetables. ? Up to 6-8 servings of whole grains each day. ? Less than 6 oz of lean meat, poultry, or fish each day. A 3-oz serving of meat is about the same size as a deck of cards. One egg equals 1 oz. ? 2 servings of low-fat dairy each day. ? A serving of nuts, seeds, or beans 5 times each week. ? Heart-healthy fats. Healthy fats called Omega-3 fatty acids are found in foods such as flaxseeds and coldwater fish, like sardines, salmon, and mackerel.  Limit how much you eat of the following: ? Canned or prepackaged foods. ? Food that is high in trans fat, such as fried foods. ? Food that is high in saturated fat, such as fatty meat. ? Sweets, desserts, sugary drinks, and other foods with  added sugar. ? Full-fat dairy products.  Do not salt foods before eating.  Try to eat at least 2 vegetarian meals each week.  Eat more home-cooked food and less restaurant, buffet, and fast food.  When eating at a restaurant, ask that your food be prepared with less salt or no salt, if possible. What foods are recommended? The items listed may not be a complete list. Talk with your dietitian about what dietary choices are best for you. Grains Whole-grain or whole-wheat bread. Whole-grain or whole-wheat pasta. Brown rice. [place]. Bulgur. Whole-grain and low-sodium cereals. Pita bread. Low-fat, low-sodium crackers. Whole-wheat flour tortillas. Vegetables Fresh or frozen vegetables (raw, steamed, roasted, or grilled). Low-sodium or reduced-sodium tomato and vegetable juice. Low-sodium or reduced-sodium tomato sauce and tomato paste. Low-sodium or reduced-sodium canned vegetables. Fruits All fresh, dried, or frozen fruit. Canned fruit in natural juice (without added sugar). Meat and other protein foods Skinless chicken or [place]. Ground chicken or [place]. Pork with fat trimmed off. Fish and seafood. Egg whites. Dried beans, peas, or lentils. Unsalted nuts, nut butters, and seeds. Unsalted canned beans. Lean cuts of beef with fat trimmed off. Low-sodium, lean deli meat. Dairy Low-fat (1%) or fat-free (skim) milk. Fat-free, low-fat, or reduced-fat cheeses. Nonfat, low-sodium ricotta or cottage cheese. Low-fat or nonfat yogurt. Low-fat, low-sodium cheese. Fats and oils Soft margarine without trans fats. Vegetable oil. Low-fat, reduced-fat, or light mayonnaise and salad dressings (reduced-sodium). Canola, safflower, olive, soybean, and sunflower oils. Avocado. Seasoning and other foods Herbs. Spices. Seasoning mixes without salt. Unsalted popcorn and pretzels. Fat-free sweets. What foods are not recommended? The items listed may not be a complete list. Talk with your dietitian about what  dietary choices are best for you. Grains Baked goods made with fat, such as croissants, muffins, or some breads. Dry pasta or rice meal packs. Vegetables Creamed or fried vegetables. Vegetables in a cheese sauce. Regular canned vegetables (not low-sodium or reduced-sodium). Regular canned tomato sauce and paste (not low-sodium or reduced-sodium). Regular tomato and vegetable juice (not low-sodium or reduced-sodium). Angie Fava. Olives. Fruits Canned fruit in a light or heavy syrup. Fried fruit. Fruit in cream or butter sauce. Meat and other protein foods Fatty cuts of meat. Ribs. Fried meat. Berniece Salines. Sausage. Bologna and other processed lunch meats. Salami. Fatback. Hotdogs. Bratwurst. Salted nuts and seeds. Canned beans with added salt. Canned or smoked fish. Whole eggs or egg yolks. Chicken or [place] with skin. Dairy Whole or 2% milk, cream, and half-and-half. Whole or full-fat cream cheese. Whole-fat or sweetened yogurt. Full-fat cheese. Nondairy creamers. Whipped toppings. Processed cheese and cheese spreads. Fats and oils Butter. Stick margarine. Lard. Shortening. Ghee. Bacon fat. Tropical oils, such as coconut, palm  kernel, or palm oil. Seasoning and other foods Salted popcorn and pretzels. Onion salt, garlic salt, seasoned salt, table salt, and sea salt. Worcestershire sauce. Tartar sauce. Barbecue sauce. Teriyaki sauce. Soy sauce, including reduced-sodium. Steak sauce. Canned and packaged gravies. Fish sauce. Oyster sauce. Cocktail sauce. Horseradish that you find on the shelf. Ketchup. Mustard. Meat flavorings and tenderizers. Bouillon cubes. Hot sauce and Tabasco sauce. Premade or packaged marinades. Premade or packaged taco seasonings. Relishes. Regular salad dressings. Where to find more information:  National Heart, Lung, and [place]: https://wilson-eaton.com/  American Heart Association: www.heart.org Summary  The DASH eating plan is a healthy eating plan that has been shown to reduce  high blood pressure (hypertension). It may also reduce your risk for type 2 diabetes, heart disease, and stroke.  With the DASH eating plan, you should limit salt (sodium) intake to 2,300 mg a day. If you have hypertension, you may need to reduce your sodium intake to 1,500 mg a day.  When on the DASH eating plan, aim to eat more fresh fruits and vegetables, whole grains, lean proteins, low-fat dairy, and heart-healthy fats.  Work with your health care provider or diet and nutrition specialist (dietitian) to adjust your eating plan to your individual calorie needs. This information is not intended to replace advice given to you by your health care provider. Make sure you discuss any questions you have with your health care provider. Document Released: 05/14/2011 Document Revised: 05/18/2016 Document Reviewed: 05/18/2016 Elsevier Interactive Patient Education  2019 [place].   Heart Attack A heart attack (myocardial infarction, MI) is when blood and oxygen supply to the heart is cut off. A heart attack causes damage to the heart that cannot be fixed. If you think you are having a heart attack, do not wait to see if the symptoms will go away. Get medical help right away. What are the signs or symptoms? Symptoms of this condition include:  Chest pain. It may feel like: ? Crushing or squeezing. ? Tightness, pressure, fullness, or heaviness.  Pain in the arm, neck, jaw, back, or upper body.  Shortness of breath.  Heartburn.  Indigestion.  Nausea.  Cold sweats.  Feeling tired.  Sudden lightheadedness. Follow these instructions at home: Medicines  Take over-the-counter and prescription medicines only as told by your doctor. You may need to take medicine: ? To keep your blood from clotting too easily. ? To control blood pressure. ? To lower cholesterol. ? To control heart rhythms.  Do not take these medicines unless your doctor says it is okay: ? NSAIDs. ? Supplements that  have vitamin A, vitamin E, or both. ? Hormone replacement therapy that has estrogen with or without progestin. Lifestyle   Do not use any products that have nicotine or tobacco. This includes cigarettes and e-cigarettes. If you need help quitting, ask your doctor.  Avoid secondhand smoke.  Exercise regularly. Ask your doctor about a cardiac rehab program.  Eat heart-healthy foods. A diet and nutrition specialist (registered dietitian) can help you form healthy eating habits.  Stay at a healthy weight.  Lower your stress level.  Do not use illegal drugs. Alcohol use  Do not drink alcohol if: ? Your doctor tells you not to drink. ? You are pregnant, may be pregnant, or are planning to become pregnant.  If you drink alcohol, limit how much you have: ? 0-1 drink a day for women. ? 0-2 drinks a day for men.  Know how much alcohol is in your drink.  In the [place] one drink equals one typical bottle of beer (12 oz), one-half glass of wine (5 oz), or one shot of hard liquor (1 oz). General instructions  Work with your doctor to treat other problems you have, like diabetes.  Get screened for depression, and get treatment if needed.  Keep your vaccinations up to date. Get the flu shot (influenza vaccination) every year.  Keep all follow-up visits as told by your doctor. This is important. Contact a doctor if:  You feel sad.  You have trouble doing your daily activities. Get help right away if you:  Have sudden, unexplained discomfort in your: ? Chest. ? Arms. ? Back. ? Neck. ? Jaw. ? Upper body.  Have shortness of breath.  Have sudden sweating or clammy skin.  Feel sick to your stomach (nauseous).  Throw up (vomit).  Suddenly get lightheaded or dizzy.  Feel your heart beating fast.  Feel your heart skipping beats.  Have blood pressure that is higher than 180/120. These symptoms may be an emergency. Do not wait to see if the symptoms will go away. Get medical  help right away. Call your local emergency services (911 in the [place] Do not drive yourself to the hospital. Summary  A heart attack is when blood and oxygen supply to the heart is cut off.  You should not take NSAIDs unless your doctor says it is okay.  Do not smoke. Avoid secondhand smoke.  Exercise regularly. Ask your doctor about a cardiac rehab program. This information is not intended to replace advice given to you by your health care provider. Make sure you discuss any questions you have with your health care provider. Document Released: 11/24/2011 Document Revised: 02/16/2018 Document Reviewed: 07/09/2017 Elsevier Interactive Patient Education  2019 [employer].   Stroke Prevention Some medical conditions and lifestyle choices can lead to a higher risk for a stroke. You can help to prevent a stroke by making nutrition, lifestyle, and other changes. What nutrition changes can be made?   Eat healthy foods. ? Choose foods that are high in fiber. These include:  Fresh fruits.  Fresh vegetables.  Whole grains. ? Eat at least 5 or more servings of fruits and vegetables each day. Try to fill half of your plate at each meal with fruits and vegetables. ? Choose lean protein foods. These include:  Lowfat (lean) cuts of meat.  Chicken without skin.  Fish.  Tofu.  Beans.  Nuts. ? Eat low-fat dairy products. ? Avoid foods that:  Are high in salt (sodium).  Have saturated fat.  Have trans fat.  Have cholesterol.  Are processed.  Are premade.  Follow eating guidelines as told by your doctor. These may include: ? Reducing how many calories you eat and drink each day. ? Limiting how much salt you eat or drink each day to 1,500 milligrams (mg). ? Using only healthy fats for cooking. These include:  Olive oil.  Canola oil.  Sunflower oil. ? Counting how many carbohydrates you eat and drink each day. What lifestyle changes can be made?  Try to stay at a  healthy weight. Talk to your doctor about what a good weight is for you.  Get at least 30 minutes of moderate physical activity at least 5 days a week. This can include: ? Fast walking. ? Biking. ? Swimming.  Do not use any products that have nicotine or tobacco. This includes cigarettes and e-cigarettes. If you need help quitting, ask your doctor. Avoid being around  tobacco smoke in general.  Limit how much alcohol you drink to no more than 1 drink a day for nonpregnant women and 2 drinks a day for men. One drink equals 12 oz of beer, 5 oz of wine, or 1 oz of hard liquor.  Do not use drugs.  Avoid taking birth control pills. Talk to your doctor about the risks of taking birth control pills if: ? You are over 38 years old. ? You smoke. ? You get migraines. ? You have had a blood clot. What other changes can be made?  Manage your cholesterol. ? It is important to eat a healthy diet. ? If your cholesterol cannot be managed through your diet, you may also need to take medicines. Take medicines as told by your doctor.  Manage your diabetes. ? It is important to eat a healthy diet and to exercise regularly. ? If your blood sugar cannot be managed through diet and exercise, you may need to take medicines. Take medicines as told by your doctor.  Control your high blood pressure (hypertension). ? Try to keep your blood pressure below 130/80. This can help lower your risk of stroke. ? It is important to eat a healthy diet and to exercise regularly. ? If your blood pressure cannot be managed through diet and exercise, you may need to take medicines. Take medicines as told by your doctor. ? Ask your doctor if you should check your blood pressure at home. ? Have your blood pressure checked every year. Do this even if your blood pressure is normal.  Talk to your doctor about getting checked for a sleep disorder. Signs of this can include: ? Snoring a lot. ? Feeling very tired.  Take  over-the-counter and prescription medicines only as told by your doctor. These may include aspirin or blood thinners (antiplatelets or anticoagulants).  Make sure that any other medical conditions you have are managed. Where to find more information  American Stroke Association: www.strokeassociation.org  National Stroke Association: www.stroke.org Get help right away if:  You have any symptoms of stroke. "BE FAST" is an easy way to remember the main warning signs: ? B - Balance. Signs are dizziness, sudden trouble walking, or loss of balance. ? E - Eyes. Signs are trouble seeing or a sudden change in how you see. ? F - Face. Signs are sudden weakness or loss of feeling of the face, or the face or eyelid drooping on one side. ? A - Arms. Signs are weakness or loss of feeling in an arm. This happens suddenly and usually on one side of the body. ? S - Speech. Signs are sudden trouble speaking, slurred speech, or trouble understanding what people say. ? T - Time. Time to call emergency services. Write down what time symptoms started.  You have other signs of stroke, such as: ? A sudden, very bad headache with no known cause. ? Feeling sick to your stomach (nausea). ? Throwing up (vomiting). ? Jerky movements you cannot control (seizure). These symptoms may represent a serious problem that is an emergency. Do not wait to see if the symptoms will go away. Get medical help right away. Call your local emergency services (911 in the [place] Do not drive yourself to the hospital. Summary  You can prevent a stroke by eating healthy, exercising, not smoking, drinking less alcohol, and treating other health problems, such as diabetes, high blood pressure, or high cholesterol.  Do not use any products that contain nicotine or tobacco, such  as cigarettes and e-cigarettes.  Get help right away if you have any signs or symptoms of a stroke. This information is not intended to replace advice given to  you by your health care provider. Make sure you discuss any questions you have with your health care provider. Document Released: 11/24/2011 Document Revised: 08/26/2016 Document Reviewed: 08/26/2016 Elsevier Interactive Patient Education  2019 [employer].

## 2018-08-15 NOTE — Chronic Care Management (AMB) (Signed)
Care Management   Initial Visit Note  08/15/2018 Name: [name] [name] MRN: [id number] DOB: 09/29/1975   Subjective: "I know I need to eat better and exercise and take my medications" "I really want to understand so I can make changes"  Objective:  Lab Results  Component Value Date   HGBA1C 7.5 (A) 07/22/2018   BP Readings from Last 3 Encounters:  07/22/18 130/88  06/24/18 120/90  06/17/18 (!) 148/86   Assessment: [name] [name] is a 43 year old male patientwho sees [name], [place] for primary care. [name] the CCM team to consult the patient for chronic care management related to diabetes education and self care. Patient has a history of but not limited to CAD, DM, HTN, and Hypercholesterolemia. Referral was placed2/14/2020. Patient's last office visit was 07/22/2018. Today CCM Team met with [name] [name] and his wife to establish health goals. Patient wanted to focus today on CAD and HTN. Will bring patient back in 2 weeks for education and goal setting related to his DM.  Review of patient status, including review of consultants reports, relevant laboratory and other test results, and collaboration with appropriate care team members and the patient's provider was performed as part of comprehensive patient evaluation and provision of chronic care management services.    {0 ED visits/ 0 Inpatient admission in the last 6 months  [name] [name] is very motivated to improve his health. He and wife have a 34 year old daughter of the home and he would like to set good health examples for her. His wife is very supportive of any lifestyle changes he needs to make. He admits to CAD with stent placement 2013. He was diagnosed with HTN and DM at that time however has not been consistent with medication adherence. He has significant family history on his fathers side of CAD, HTN, and DM. He also endorses a history of depression and admits to non-compliance with depression medications. He  is doing OK but still admits to anger and anxiety intermittently. He would like to see a mental health provider to discuss. He has not seen a mental health provider since he moved from [place], [place].   Goals Addressed            This Visit's Progress   . "I have had heart disease and high blood pressure since 2013" (pt-stated)       Current Barriers:  [name] Knowledge Deficits related to basic understanding of hypertension pathophysiology and self care management . Non-adherence to prescribed medication regimine . Difficulty obtaining medications  Nurse Case Manager Clinical Goal(s):  [name] Over the next 14 days, patient will verbalize understanding of plan for hypertension management . Over the next 14 days, patient will demonstrate improved adherence to prescribed treatment plan for hypertension as evidenced by taking all medications as prescribed, monitoring and recording blood pressure as directed, adhering to low sodium/DASH diet . Over the next 14 days, patient will demonstrate improved health management independence as evidenced by checking blood pressure as directed and notifying PCP if SBP>140 or DBP > 90, taking all medications as prescribe, and adhering to a low sodium diet as discussed.  Interventions:  . Evaluation of current treatment plan related to hypertension self management and patient's adherence to plan as established by provider. . Provided education to patient re: stroke prevention, s/s of heart attack and stroke, DASH diet, complications of uncontrolled blood pressure . Reviewed medications with patient and discussed importance of compliance . Discussed plans with patient for ongoing  care management follow up and provided patient with direct contact information for care management team . Advised patient, providing education and rationale, to monitor blood pressure daily and record, calling PCP for findings outside established parameters.  . Reviewed scheduled/upcoming  provider appointments including:   Patient Self Care Activities:  . Self administers medications as prescribed . Attends all scheduled provider appointments . Calls provider office for new concerns, questions, or BP outside discussed parameters . Checks BP and records as discussed . Follows a low sodium diet/DASH diet  Plan:  . RNCM will follow up with patient in 2 weeks   Initial goal documentation     . I quit taking my seroquel because it make me gain weight (pt-stated)       Current Barriers:  [name] Knowledge Deficits related to how to access mental health provider in [facility]  . Knowledge Deficits related to understanding importance of managing depression and anxiety as it relates to overall wellbeing  Nurse Case Manager Clinical Goal(s):  [name] Over the next 30 days, patient will verbalize understanding of plan for management of depression and anxiety  Interventions:  . Collaborated with [name], PA-C regarding patients history of mental illness and desire to resume medical care  . Discussed plans with patient for ongoing care management follow up and provided patient with direct contact information for care management team  Patient Self Care Activities:  . Self administers medications as prescribed . Attends all scheduled provider appointments . Calls provider office for new concerns or questions  Plan:  . RNCM will Colloborate with PCP to establish plan of care related to mental health   Initial goal documentation      . Patient Stated (pt-stated)          Follow up plan:  Face to Face appointment with CCM team member scheduled foR:  08/29/2018  [name] [name] was given information about Care Management services today including:  1. Case Management services include personalized support from designated clinical staff supervised by a physician, including individualized plan of care and coordination with other care providers 2. 24/7 contact phone numbers for  assistance for urgent and routine care needs. 3. The patient may stop CCM services at any time (effective at the end of the month) by phone call to the office staff.  Patient agreed to services and verbal consent obtained.   E. Rollene Rotunda, RN, BSN Nurse Care Coordinator [facility] Care Management [phone]

## 2018-08-15 NOTE — Patient Instructions (Signed)
DASH Eating Plan  DASH stands for "Dietary Approaches to Stop Hypertension." The DASH eating plan is a healthy eating plan that has been shown to reduce high blood pressure (hypertension). It may also reduce your risk for type 2 diabetes, heart disease, and stroke. The DASH eating plan may also help with weight loss.  What are tips for following this plan?    General guidelines   Avoid eating more than 2,300 mg (milligrams) of salt (sodium) a day. If you have hypertension, you may need to reduce your sodium intake to 1,500 mg a day.   Limit alcohol intake to no more than 1 drink a day for nonpregnant women and 2 drinks a day for men. One drink equals 12 oz of beer, 5 oz of wine, or 1 oz of hard liquor.   Work with your health care provider to maintain a healthy body weight or to lose weight. Ask what an ideal weight is for you.   Get at least 30 minutes of exercise that causes your heart to beat faster (aerobic exercise) most days of the week. Activities may include walking, swimming, or biking.   Work with your health care provider or diet and nutrition specialist (dietitian) to adjust your eating plan to your individual calorie needs.  Reading food labels     Check food labels for the amount of sodium per serving. Choose foods with less than 5 percent of the Daily Value of sodium. Generally, foods with less than 300 mg of sodium per serving fit into this eating plan.   To find whole grains, look for the word "whole" as the first word in the ingredient list.  Shopping   Buy products labeled as "low-sodium" or "no salt added."   Buy fresh foods. Avoid canned foods and premade or frozen meals.  Cooking   Avoid adding salt when cooking. Use salt-free seasonings or herbs instead of table salt or sea salt. Check with your health care provider or pharmacist before using salt substitutes.   Do not fry foods. Cook foods using healthy methods such as baking, boiling, grilling, and broiling instead.   Cook with  heart-healthy oils, such as olive, canola, soybean, or sunflower oil.  Meal planning   Eat a balanced diet that includes:  ? 5 or more servings of fruits and vegetables each day. At each meal, try to fill half of your plate with fruits and vegetables.  ? Up to 6-8 servings of whole grains each day.  ? Less than 6 oz of lean meat, poultry, or fish each day. A 3-oz serving of meat is about the same size as a deck of cards. One egg equals 1 oz.  ? 2 servings of low-fat dairy each day.  ? A serving of nuts, seeds, or beans 5 times each week.  ? Heart-healthy fats. Healthy fats called Omega-3 fatty acids are found in foods such as flaxseeds and coldwater fish, like sardines, salmon, and mackerel.   Limit how much you eat of the following:  ? Canned or prepackaged foods.  ? Food that is high in trans fat, such as fried foods.  ? Food that is high in saturated fat, such as fatty meat.  ? Sweets, desserts, sugary drinks, and other foods with added sugar.  ? Full-fat dairy products.   Do not salt foods before eating.   Try to eat at least 2 vegetarian meals each week.   Eat more home-cooked food and less restaurant, buffet, and fast food.     When eating at a restaurant, ask that your food be prepared with less salt or no salt, if possible.  What foods are recommended?  The items listed may not be a complete list. Talk with your dietitian about what dietary choices are best for you.  Grains  Whole-grain or whole-wheat bread. Whole-grain or whole-wheat pasta. Brown rice. Oatmeal. Quinoa. Bulgur. Whole-grain and low-sodium cereals. Pita bread. Low-fat, low-sodium crackers. Whole-wheat flour tortillas.  Vegetables  Fresh or frozen vegetables (raw, steamed, roasted, or grilled). Low-sodium or reduced-sodium tomato and vegetable juice. Low-sodium or reduced-sodium tomato sauce and tomato paste. Low-sodium or reduced-sodium canned vegetables.  Fruits  All fresh, dried, or frozen fruit. Canned fruit in natural juice (without  added sugar).  Meat and other protein foods  Skinless chicken or turkey. Ground chicken or turkey. Pork with fat trimmed off. Fish and seafood. Egg whites. Dried beans, peas, or lentils. Unsalted nuts, nut butters, and seeds. Unsalted canned beans. Lean cuts of beef with fat trimmed off. Low-sodium, lean deli meat.  Dairy  Low-fat (1%) or fat-free (skim) milk. Fat-free, low-fat, or reduced-fat cheeses. Nonfat, low-sodium ricotta or cottage cheese. Low-fat or nonfat yogurt. Low-fat, low-sodium cheese.  Fats and oils  Soft margarine without trans fats. Vegetable oil. Low-fat, reduced-fat, or light mayonnaise and salad dressings (reduced-sodium). Canola, safflower, olive, soybean, and sunflower oils. Avocado.  Seasoning and other foods  Herbs. Spices. Seasoning mixes without salt. Unsalted popcorn and pretzels. Fat-free sweets.  What foods are not recommended?  The items listed may not be a complete list. Talk with your dietitian about what dietary choices are best for you.  Grains  Baked goods made with fat, such as croissants, muffins, or some breads. Dry pasta or rice meal packs.  Vegetables  Creamed or fried vegetables. Vegetables in a cheese sauce. Regular canned vegetables (not low-sodium or reduced-sodium). Regular canned tomato sauce and paste (not low-sodium or reduced-sodium). Regular tomato and vegetable juice (not low-sodium or reduced-sodium). Pickles. Olives.  Fruits  Canned fruit in a light or heavy syrup. Fried fruit. Fruit in cream or butter sauce.  Meat and other protein foods  Fatty cuts of meat. Ribs. Fried meat. Bacon. Sausage. Bologna and other processed lunch meats. Salami. Fatback. Hotdogs. Bratwurst. Salted nuts and seeds. Canned beans with added salt. Canned or smoked fish. Whole eggs or egg yolks. Chicken or turkey with skin.  Dairy  Whole or 2% milk, cream, and half-and-half. Whole or full-fat cream cheese. Whole-fat or sweetened yogurt. Full-fat cheese. Nondairy creamers. Whipped toppings.  Processed cheese and cheese spreads.  Fats and oils  Butter. Stick margarine. Lard. Shortening. Ghee. Bacon fat. Tropical oils, such as coconut, palm kernel, or palm oil.  Seasoning and other foods  Salted popcorn and pretzels. Onion salt, garlic salt, seasoned salt, table salt, and sea salt. Worcestershire sauce. Tartar sauce. Barbecue sauce. Teriyaki sauce. Soy sauce, including reduced-sodium. Steak sauce. Canned and packaged gravies. Fish sauce. Oyster sauce. Cocktail sauce. Horseradish that you find on the shelf. Ketchup. Mustard. Meat flavorings and tenderizers. Bouillon cubes. Hot sauce and Tabasco sauce. Premade or packaged marinades. Premade or packaged taco seasonings. Relishes. Regular salad dressings.  Where to find more information:   National Heart, Lung, and Blood Institute: www.nhlbi.nih.gov   American Heart Association: www.heart.org  Summary   The DASH eating plan is a healthy eating plan that has been shown to reduce high blood pressure (hypertension). It may also reduce your risk for type 2 diabetes, heart disease, and stroke.   With the   DASH eating plan, you should limit salt (sodium) intake to 2,300 mg a day. If you have hypertension, you may need to reduce your sodium intake to 1,500 mg a day.   When on the DASH eating plan, aim to eat more fresh fruits and vegetables, whole grains, lean proteins, low-fat dairy, and heart-healthy fats.   Work with your health care provider or diet and nutrition specialist (dietitian) to adjust your eating plan to your individual calorie needs.  This information is not intended to replace advice given to you by your health care provider. Make sure you discuss any questions you have with your health care provider.  Document Released: 05/14/2011 Document Revised: 05/18/2016 Document Reviewed: 05/18/2016  Elsevier Interactive Patient Education  2019 Elsevier Inc.

## 2018-08-17 ENCOUNTER — Other Ambulatory Visit: Payer: Self-pay | Admitting: Physician Assistant

## 2018-08-17 DIAGNOSIS — F329 Major depressive disorder, single episode, unspecified: Secondary | ICD-10-CM

## 2018-08-17 DIAGNOSIS — F909 Attention-deficit hyperactivity disorder, unspecified type: Secondary | ICD-10-CM

## 2018-08-17 DIAGNOSIS — F32A Depression, unspecified: Secondary | ICD-10-CM

## 2018-08-17 NOTE — Chronic Care Management (AMB) (Signed)
Chronic Care Management   Note  08/17/2018 Name: [name] [name] MRN: [id number] DOB: 1976/06/02  Subjective:   Does the patient  feel that his/her medications are working for him/her?  "mostly"  Has the patient been experiencing any side effects to the medications prescribed?  Yes- Seroquel (weight gain)  Does the patient measure his/her own blood glucose at home?  yes   Does the patient measure his/her own blood pressure at home? yes   Does the patient have any problems obtaining medications due to transportation or finances?   no  Understanding of regimen: poor Understanding of indications: poor Potential of compliance: fair  Objective: Lab Results  Component Value Date   CREATININE 1.09 06/24/2018   CREATININE 0.96 04/11/2018   CREATININE 0.96 02/02/2018    Lab Results  Component Value Date   HGBA1C 7.5 (A) 07/22/2018    Lipid Panel     Component Value Date/Time   CHOL 164 07/22/2018 0940   TRIG 139 07/22/2018 0940   HDL 48 07/22/2018 0940   CHOLHDL 3.4 07/22/2018 0940   CHOLHDL 4 11/23/2011 1026   VLDL 40.0 11/23/2011 1026   LDLCALC 88 07/22/2018 0940   LDLDIRECT 129 (H) 06/24/2018 1507    BP Readings from Last 3 Encounters:  07/22/18 130/88  06/24/18 120/90  06/17/18 (!) 148/86    Allergies  Allergen Reactions  . Penicillins     Medications Reviewed Today    Reviewed by Cathi Roan, [facility] (Pharmacist) on 08/15/18 at 1439  Med List Status: <None>  Medication Order Taking? Sig Documenting Provider Last Dose Status Informant  amLODipine (NORVASC) 10 MG tablet [id number] Yes Take 1 tablet (10 mg total) by mouth daily. [name], PA-C Taking Active   aspirin 81 MG tablet [id number] Yes Take 81 mg by mouth daily.  [provider] Taking Active Multiple Informants  cyclobenzaprine (FLEXERIL) 5 MG tablet [id number] Yes Take 1 tablet (5 mg total) by mouth 2 (two) times daily as needed for muscle spasms. [name], PA-C Taking Active    ezetimibe (ZETIA) 10 MG tablet [id number] Yes Take 1 tablet (10 mg total) by mouth daily. Rise Mu, PA-C Taking Active   metFORMIN (GLUCOPHAGE) 1000 MG tablet [id number] Yes Take 1 tablet (1,000 mg total) by mouth 2 (two) times daily with a meal. [name], PA-C Taking Active            Med Note Kary Kos, Evelena Peat Aug 15, 2018  2:38 PM) Taking once daily  metoprolol succinate (TOPROL-XL) 25 MG 24 hr tablet [id number] Yes TAKE 1 TABLET(25 MG) BY MOUTH AT BEDTIME Pollak, Adriana M, PA-C Taking Active   nitroGLYCERIN (NITROSTAT) 0.4 MG SL tablet [id number]  Place 1 tablet (0.4 mg total) under the tongue every 5 (five) minutes as needed for chest pain. Maximum of 3 doses. [name], PA-C  Expired 07/10/18 2359   pantoprazole (PROTONIX) 40 MG tablet [id number] Yes TAKE 1 TABLET(40 MG) BY MOUTH DAILY [name], PA-C Taking Active            Med Note Kary Kos,  E   Mon Aug 15, 2018  2:38 PM) Needs refill   QUEtiapine (SEROQUEL XR) 50 MG TB24 24 hr tablet [id number]  Take 1 tablet (50 mg total) by mouth at bedtime. [name], PA-C  Expired 07/10/18 2359   rosuvastatin (CRESTOR) 40 MG tablet [id number] Yes Take 1 tablet (40 mg total) by mouth daily. [name], PA-C Taking  Active            Assessment:   Total Number of meds: 8 (polypharmacy > 10 meds)  Indications for all medications: [x]  Yes       []  No  Adherence Review  []  Excellent (no doses missed/week)     []  Good (no more than 1 dose missed/week)     [x]  Partial (2-3 doses missed/week)     []  Poor (>3 doses missed/week)  Intervention  YES NO  Explanation  Needs additional therapy      Medication requires monitoring []  []    Preventative therapy   [x]  []  Needs new Nitrostat RX  Possible untreated condition   []  []    Synergistic therapy   []  []     Safety/Adverse Med Event      Contraindication present []  []     Unsafe medication []  []     Allergic reaction []  []     Drug interaction  []  []     Excessive dose/duration []  []     Undesirable side effect [x]  []  Seroquel- weight gain   Adherence      Cannot afford medication []  []     Cannot self-administer medication appropriately []  []     Does not understand directions [x]  []  Adverse to taking medications in general; emphasized importance of adherence with special focus on cholesterol management   Prefers not to take []  []     Product unavailable []  []     Forgets to take []  []     Different drug needed      Condition refractory to medication [x]  []  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 "good" cholesterol   Goals Addressed            This Visit's Progress   . I quit taking my seroquel because it make me gain weight (pt-stated)       Current Barriers:  [name] Knowledge Deficits related to how to access mental health provider in [facility]  . Knowledge Deficits related to understanding importance of managing depression and anxiety as it relates to overall wellbeing  Nurse Case Manager Clinical Goal(s):  [name] Over the next 30 days, patient will verbalize understanding of plan for management of depression and anxiety  Interventions:  . Collaborated with [name], PA-C regarding patients history of mental illness and desire to resume medical care  . Discussed plans with patient for ongoing care management follow up and provided patient with direct contact information for care management team  Patient Self Care Activities:  . Self administers medications as prescribed . Attends all scheduled provider appointments . Calls provider office for new concerns or questions  Plan:  . RNCM will Colloborate with PCP to establish plan of care related to mental health   Initial goal  documentation   Current Barriers:  . Non Adherence to prescribed medication regimen  Pharmacist Clinical Goal(s):  [name] Over the next 14 days, patient will work with pharmacist and mental health provider to address needs related to new medication regiment to manage depression  Interventions: . Collaboration with provider re: medication management  Patient Self Care Activities:  . Calls provider office for new concerns or questions  Plan: . PharmD will collaborate with prescribers to optimize medication regimen  Initial goal documentation      .  I want to understand my medications better (pt-stated)       Current Barriers:  [name] Knowledge Deficits related to medication regimen   Pharmacist Clinical Goal(s):  [name] Over the next 30 days, patient will demonstrate improved understanding of prescribed medications and rationale for usage as evidenced by improved adherence and verbalization of medication plan for each medical problem  Interventions: . Comprehensive medication review performed. . Advised patient to on proper use, administration, efficacy, and side effects for cholesterol medications . Discussed plans with patient for ongoing care management follow up and provided patient with direct contact information for care management team  Patient Self Care Activities:  . Self administers medications as prescribed  Plan: . PharmD willprovide comprehensive medication education for medication list  Initial goal documentation        Time:  Time spent counseling patient: 30 Additional time spent on charting: 15 Review of patient status, including review of consultants reports, laboratory and other test data, was performed as part of comprehensive evaluation and provision of chronic care management services.   Plan: 1. Establish care with local mental health provider Recommendations discussed with provider upon establishing care - Change quetiapine to therapeutic alternative for  better adherence, decreased side effect (weight gain)  2. Recommendations for [name]- PA - need new NitroStat prescription  Follow up: Telephone follow up appointment with CCM team member scheduled for: [facility] telephone call, 2 weeks  Ruben Reason, PharmD Clinical Pharmacist [place] [phone]

## 2018-08-29 ENCOUNTER — Ambulatory Visit: Payer: Self-pay

## 2018-08-29 DIAGNOSIS — F329 Major depressive disorder, single episode, unspecified: Secondary | ICD-10-CM

## 2018-08-29 DIAGNOSIS — F32A Depression, unspecified: Secondary | ICD-10-CM

## 2018-08-29 DIAGNOSIS — E1142 Type 2 diabetes mellitus with diabetic polyneuropathy: Secondary | ICD-10-CM

## 2018-08-29 DIAGNOSIS — I10 Essential (primary) hypertension: Secondary | ICD-10-CM

## 2018-08-29 NOTE — Chronic Care Management (AMB) (Signed)
  Care Management   Note  08/29/2018 Name: Kahiau Schewe MRN: [id number] DOB: 1975/10/14  Care Coordination: Unsuccessful telephone call to Mr. Alondra Sahni who is scheduled for a follow up face to face encounter with CCM RN CM on Wednesday 08/31/2018 at 1:00. CCM RN CM is currently working remotely secondary to the COVID-19 pandemic and unable to meet with patient in the office. CCM RN CM continues to follow patients and meet needs via telephone and will change face to face appointment to telephone encounter.  CCM RN CM left HIPAA compliant message requesting call back.   Follow Up Plan: Will follow up with patient Wednesday morning prior to appointment time to insure he received message to not come to office.   E. Rollene Rotunda, RN, BSN Nurse Care Coordinator [facility] Care Management [phone]

## 2018-08-30 ENCOUNTER — Telehealth: Payer: Self-pay

## 2018-08-30 NOTE — Telephone Encounter (Signed)
Please call patient and let him know that referral to psychiatry cannot be completed until he changes PCP on insurance card.   Patient advised as directed below.

## 2018-08-31 ENCOUNTER — Ambulatory Visit: Payer: Self-pay

## 2018-08-31 ENCOUNTER — Telehealth: Payer: Self-pay

## 2018-08-31 DIAGNOSIS — E1142 Type 2 diabetes mellitus with diabetic polyneuropathy: Secondary | ICD-10-CM

## 2018-08-31 DIAGNOSIS — I2510 Atherosclerotic heart disease of native coronary artery without angina pectoris: Secondary | ICD-10-CM

## 2018-08-31 DIAGNOSIS — I10 Essential (primary) hypertension: Secondary | ICD-10-CM

## 2018-08-31 NOTE — Chronic Care Management (AMB) (Signed)
   Care Management   Unsuccessful Call Note 08/31/2018 Name: [name] [name] MRN: [id number] DOB: [birth year]  [name] a 43year old male patientwho seesAdriana Terrilee Croak, PA-Cfor primary care. [name] the CCM team to consult the patient forchronic care management related to diabetes education and self care. Patient has a history of but not limited toCAD, DM, HTN, and Hypercholesterolemia. Referral was placed2/14/2020. Patient's last office visit was2/14/2020. CCM Team met with [name] and his wife to establish health goals 08/14/3028. Patient was scheduled to meet CCM RN CM for second face to face today however appointment has been changed to telephone visit secondary to COVID-19 pandemic and patient has been notified via left message on VM.    Was unable to reach patient via telephone for follow up on goals. I have left HIPAA compliant voicemail asking patient to return my call. (unsuccessful outreach #2).   Plan: Will follow-up within 7 business days via telephone.     E. Rollene Rotunda, RN, BSN Nurse Care Coordinator [facility] Care Management [phone]

## 2018-09-05 ENCOUNTER — Telehealth: Payer: Self-pay

## 2018-09-05 ENCOUNTER — Ambulatory Visit: Payer: Self-pay

## 2018-09-05 DIAGNOSIS — E1165 Type 2 diabetes mellitus with hyperglycemia: Secondary | ICD-10-CM

## 2018-09-05 DIAGNOSIS — I10 Essential (primary) hypertension: Secondary | ICD-10-CM

## 2018-09-05 DIAGNOSIS — I2510 Atherosclerotic heart disease of native coronary artery without angina pectoris: Secondary | ICD-10-CM

## 2018-09-05 NOTE — Chronic Care Management (AMB) (Signed)
   Care Management   Unsuccessful Call Note 09/05/2018 Name: [name] [name] MRN: [id number] DOB: May 22, 1976   [name] a 43year old male patientwho seesAdriana Terrilee Croak, PA-Cfor primary care. [name] the CCM team to consult the patient forchronic care management related to diabetes education and self care. Patient has a history of but not limited toCAD, DM, HTN, and Hypercholesterolemia. Referral was placed2/14/2020. Patient's last office visit was2/14/2020. CCM Team met with [name] and his wife to establish health goals 08/14/3028. Today CCM RN CM reached out to [name] via telephone to assess progression towards health goals.    Was unable to reach patient via telephone. I have left HIPAA compliant voicemail asking patient to return my call. (unsuccessful outreach #2).   Plan: Will follow-up within 7 business days via telephone.     E. Rollene Rotunda, RN, BSN Nurse Care Coordinator [facility] [phone]

## 2018-09-07 ENCOUNTER — Ambulatory Visit: Payer: Self-pay

## 2018-09-07 ENCOUNTER — Telehealth: Payer: Self-pay

## 2018-09-07 DIAGNOSIS — I10 Essential (primary) hypertension: Secondary | ICD-10-CM

## 2018-09-07 DIAGNOSIS — I2510 Atherosclerotic heart disease of native coronary artery without angina pectoris: Secondary | ICD-10-CM

## 2018-09-07 DIAGNOSIS — E1165 Type 2 diabetes mellitus with hyperglycemia: Secondary | ICD-10-CM

## 2018-09-07 NOTE — Patient Instructions (Signed)
Thank you allowing the Chronic Care Management Team to be a part of your care! It was a pleasure speaking with you today!  1. Continue to check your BP daily. Your reported numbers look GREAT 2. Continue to check your glucose daily. Your reported numbers look GREAT 3. Take all medications as prescribed 4. Continue to follow the ADA and HTN (low sodium) diet previously discussed 5. Please call the Medicaid office ASAP to have your Primary Care Provider changed to [name] and provide office with updated card.  CCM (Chronic Care Management) Team   Trish Fountain RN, BSN Nurse Care Coordinator  [phone]  Ruben Reason PharmD  Clinical Pharmacist  [phone]   Elliot Gurney, LCSW Clinical Social Worker [phone]  Goals Addressed            This Visit's Progress   . COMPLETED: "I have had heart disease and high blood pressure since 2013" (pt-stated)       Current Barriers:  [name] Knowledge Deficits related to basic understanding of hypertension pathophysiology and self care management . Non-adherence to prescribed medication regimine . Difficulty obtaining medications  Nurse Case Manager Clinical Goal(s):  [name] Over the next 14 days, patient will verbalize understanding of plan for hypertension management . Over the next 14 days, patient will demonstrate improved adherence to prescribed treatment plan for hypertension as evidenced by taking all medications as prescribed, monitoring and recording blood pressure as directed, adhering to low sodium/DASH diet . Over the next 14 days, patient will demonstrate improved health management independence as evidenced by checking blood pressure as directed and notifying PCP if SBP>140 or DBP > 90, taking all medications as prescribe, and adhering to a low sodium diet as discussed.  Interventions:  . Evaluation of current treatment plan related to hypertension self management and patient's adherence to plan as established by  provider. . Provided education to patient re: stroke prevention, s/s of heart attack and stroke, DASH diet, complications of uncontrolled blood pressure . Reviewed medications with patient and discussed importance of compliance . Discussed plans with patient for ongoing care management follow up and provided patient with direct contact information for care management team . Advised patient, providing education and rationale, to monitor blood pressure daily and record, calling PCP for findings outside established parameters.  . Reviewed scheduled/upcoming provider appointments including:   Patient Self Care Activities:  . Self administers medications as prescribed . Attends all scheduled provider appointments . Calls provider office for new concerns, questions, or BP outside discussed parameters . Checks BP and records as discussed . Follows a low sodium diet/DASH diet  Plan:  . RNCM will follow up with patient in 2 weeks   Initial goal documentation     . COMPLETED: I quit taking my seroquel because it make me gain weight (pt-stated)       Current Barriers:  [name] Knowledge Deficits related to how to access mental health provider in [facility]  . Knowledge Deficits related to understanding importance of managing depression and anxiety as it relates to overall wellbeing  Nurse Case Manager Clinical Goal(s):  [name] Over the next 30 days, patient will verbalize understanding of plan for management of depression and anxiety  Interventions:  . Collaborated with [name], PA-C regarding patients history of mental illness and desire to resume medical care  . Discussed plans with patient for ongoing care management follow up and provided patient with direct contact information for care management team  Patient Self Care Activities:  .  Self administers medications as prescribed . Attends all scheduled provider appointments . Calls provider office for new concerns or questions  Plan:   . RNCM will Colloborate with PCP to establish plan of care related to mental health   Initial goal documentation   Current Barriers:  . Non Adherence to prescribed medication regimen  Pharmacist Clinical Goal(s):  [name] Over the next 14 days, patient will work with pharmacist and mental health provider to address needs related to new medication regiment to manage depression  Interventions: . Collaboration with provider re: medication management  Patient Self Care Activities:  . Calls provider office for new concerns or questions  Plan: . PharmD will collaborate with prescribers to optimize medication regimen  Initial goal documentation      . COMPLETED: I want to understand my medications better (pt-stated)       Current Barriers:  [name] Knowledge Deficits related to medication regimen   Pharmacist Clinical Goal(s):  [name] Over the next 30 days, patient will demonstrate improved understanding of prescribed medications and rationale for usage as evidenced by improved adherence and verbalization of medication plan for each medical problem  Interventions: . Comprehensive medication review performed. . Advised patient to on proper use, administration, efficacy, and side effects for cholesterol medications . Discussed plans with patient for ongoing care management follow up and provided patient with direct contact information for care management team  Patient Self Care Activities:  . Self administers medications as prescribed  Plan: . PharmD willprovide comprehensive medication education for medication list  Initial goal documentation        The patient verbalized understanding of instructions provided today and declined a print copy of patient instruction materials.   No further follow up required: Patient has met goals however the CCM Team will remain available to assist you with your health goals

## 2018-09-07 NOTE — Chronic Care Management (AMB) (Signed)
Care Management   Follow Up Note   09/07/2018 Name: [name] [name] MRN: [id number] DOB: 08/24/1975  Referred by: [name], PA-C Reason for referral : Chronic Care Management (incoming call)    Subjective: "My numbers are good"   Objective:  Lab Results  Component Value Date   HGBA1C 7.5 (A) 07/22/2018   BP Readings from Last 3 Encounters:  07/22/18 130/88  06/24/18 120/90  06/17/18 (!) 148/86     Assessment: [name] [name] a 43year old male patientwho seesAdriana Terrilee Croak, PA-Cfor primary care. [name] the CCM team to consult the patient forchronic care management related to diabetes education and self care. Patient has a history of but not limited toCAD, DM, HTN, and Hypercholesterolemia. Referral was placed2/14/2020. Patient's last office visit was2/14/2020. Today CCM Team met with Mr. [name] [name] and his wife to establish health goals. Patient wanted to focus today on CAD and HTN. [name] was scheduled to return in 2 weeks for additional needs however face to face was canceled secondary to COVID-19  Review of patient status, including review of consultants reports, relevant laboratory and other test results, and collaboration with appropriate care team members and the patient's provider was performed as part of comprehensive patient evaluation and provision of chronic care management services.    Mr. [name] [name] states he is doing very well. He is appreciative of all eduction he previously received from [place]. He is checking his sugars daily and reports this mornings reading of 109. He is checking his BP daily as well and reports 112/77 with a max BP 140/85 post workout. He is working out daily and following DM and HTN (low sodium) diet. He has cut his alcohol intake by 1/2 on weekends and does not consume alcohol M-F. He has all medication and taking as prescribed.   Ms. [name] [name] states exercise is helping his mood but is still interested in  obtaining a mental health provider. Unfortunately his referral placed by PCP was unable to be processed secondary to him needing to change PCP to [name] on [employer]. He has been instructed today by CCM RN CM to call Medicaid number to change PCP. He will provide updated Medicaid card to PCP when available  Goals Addressed            This Visit's Progress   . COMPLETED: "I have had heart disease and high blood pressure since 2013" (pt-stated)         Current Barriers:  [name] [name] Knowledge Deficits related to basic understanding of hypertension pathophysiology and self care management . Non-adherence to prescribed medication regimine . Difficulty obtaining medications  Nurse Case Manager Clinical Goal(s):  [name] [name] Over the next 14 days, patient will verbalize understanding of plan for hypertension management . Over the next 14 days, patient will demonstrate improved adherence to prescribed treatment plan for hypertension as evidenced by taking all medications as prescribed, monitoring and recording blood pressure as directed, adhering to low sodium/DASH diet . Over the next 14 days, patient will demonstrate improved health management independence as evidenced by checking blood pressure as directed and notifying PCP if SBP>140 or DBP > 90, taking all medications as prescribe, and adhering to a low sodium diet as discussed.  Interventions:  . Evaluation of current treatment plan related to hypertension self management and patient's adherence to plan as established by provider. . Provided education to patient re: stroke prevention, s/s of heart attack and stroke, DASH diet, complications of uncontrolled blood pressure . Reviewed medications with patient  and discussed importance of compliance . Discussed plans with patient for ongoing care management follow up and provided patient with direct contact information for care management team . Advised patient, providing education and rationale, to  monitor blood pressure daily and record, calling PCP for findings outside established parameters.  . Reviewed scheduled/upcoming provider appointments including:   Patient Self Care Activities:  . Self administers medications as prescribed . Attends all scheduled provider appointments . Calls provider office for new concerns, questions, or BP outside discussed parameters . Checks BP and records as discussed . Follows a low sodium diet/DASH diet  Plan:  . RNCM will follow up with patient in 2 weeks   Initial goal documentation     . COMPLETED: I quit taking my seroquel because it make me gain weight (pt-stated)       Current Barriers:  [name] [name] Knowledge Deficits related to how to access mental health provider in [facility]  . Knowledge Deficits related to understanding importance of managing depression and anxiety as it relates to overall wellbeing  Nurse Case Manager Clinical Goal(s):  [name] [name] Over the next 30 days, patient will verbalize understanding of plan for management of depression and anxiety  Interventions:  . Collaborated with [name], PA-C regarding patients history of mental illness and desire to resume medical care  . Discussed plans with patient for ongoing care management follow up and provided patient with direct contact information for care management team  Patient Self Care Activities:  . Self administers medications as prescribed . Attends all scheduled provider appointments . Calls provider office for new concerns or questions  Plan:  . RNCM will Colloborate with PCP to establish plan of care related to mental health   Initial goal documentation   Current Barriers:  . Non Adherence to prescribed medication regimen  Pharmacist Clinical Goal(s):  [name] [name] Over the next 14 days, patient will work with pharmacist and mental health provider to address needs related to new medication regiment to manage depression  Interventions: . Collaboration with provider re:  medication management  Patient Self Care Activities:  . Calls provider office for new concerns or questions  Plan: . PharmD will collaborate with prescribers to optimize medication regimen  Initial goal documentation         Mr. Traver Meckes has met all care management goals. Review of patient status, including review of consultants reports, relevant laboratory and other test results, and collaboration with appropriate care team members and the patient's provider was performed as part of comprehensive patient evaluation and provision of chronic care management services.  The care management team is available to Mr. Octaviano Mukai at any time to assist with care management needs should they arise. Mr.  Medlock has been given contact information and instructions to contact the care management team with any questions or should new care management needs arise.     E. Rollene Rotunda, RN, BSN Nurse Care Coordinator [facility] [phone]

## 2018-09-12 ENCOUNTER — Telehealth: Payer: Self-pay

## 2018-09-12 ENCOUNTER — Ambulatory Visit: Payer: Self-pay

## 2018-09-14 ENCOUNTER — Telehealth: Payer: Self-pay | Admitting: *Deleted

## 2018-09-14 NOTE — Telephone Encounter (Signed)

## 2018-09-16 NOTE — Progress Notes (Signed)
Virtual Visit via Video Note   This visit type was conducted due to national recommendations for restrictions regarding the COVID-19 Pandemic (e.g. social distancing) in an effort to limit this patient's exposure and mitigate transmission in our community.  Due to his co-morbid illnesses, this patient is at least at moderate risk for complications without adequate follow up.  This format is felt to be most appropriate for this patient at this time.  All issues noted in this document were discussed and addressed.  A limited physical exam was performed with this format.  Verbal consent was obtained from the patient.  Video conference was attempted by had to be converted to a telephone visit due to technical difficulties with the patient's device.   Evaluation Performed:  Follow-up visit  Date:  09/19/2018   ID:  [name] [name], DOB Jul 09, 1975, MRN [id number]  Patient Location: Home  Provider Location: Office  PCP:  [name], PA-C  Cardiologist:  Nelva Bush, MD  Electrophysiologist:  None   Chief Complaint:  Follow-up CAD and shoulder/arm pain  History of Present Illness:    [name] [name] is a 43 y.o. male who presents via audio/video conferencing for a telehealth visit today.  He has a history of coronary artery disease status post bare-metal stent to the LAD (2012), hypertension, hyperlipidemia, diabetes mellitus, obesity, ADHD, and polysubstance abuse.  I met Mr. Harmon in August, at which time he complained of left shoulder pain that seemed musculoskeletal in nature.  However, given his history of CAD, the patient and his wife were concerned about possible anginal equivalent.  Subsequent exercise tolerance test was low risk without ischemic changes.  Hypertensive blood pressure response was noted.  He was last seen by Christell Faith, PA, in January, at which time he was doing well from a heart standpoint.  Due to suboptimal lipid control on last check in February, Mr.  Remo PCP switched him from atorvastatin to rosuvastatin 40 mg daily; he also remains on ezetimibe 10 mg daily.  He intermittently checks his blood pressure and notes that is was around 127/73 last week.  Mr. Mccollum notes occasional soreness in his shoulders when he exercises a lot.  He denies exertional chest pain, shortness of breath, palpitations, lightheadedness, and edema.  He is tolerating his medications well.  He has been practicing social distancing.  The patient does not have symptoms concerning for COVID-19 infection (fever, chills, cough, or new shortness of breath).    Past Medical History:  Diagnosis Date  . ADHD (attention deficit hyperactivity disorder)   . Allergy   . Arthritis   . CAD (coronary artery disease)    S/P cath May 2012 with BMS to the LAD following abnormal stress test  . Diabetes mellitus   . Drug abuse ([place])    history of marijuana use and alcohol  . GERD (gastroesophageal reflux disease)   . Hypercholesterolemia   . Hypertension   . Myocardial infarction ([place])   . Obesity   . Oxygen deficiency   . Sleep apnea    Past Surgical History:  Procedure Laterality Date  . ABSCESS DRAINAGE     right arm; approx 2003  . CARDIAC CATHETERIZATION    . CORONARY ANGIOPLASTY    . CORONARY STENT PLACEMENT  May 2012   LAD     Current Meds  Medication Sig  . amLODipine (NORVASC) 10 MG tablet Take 1 tablet (10 mg total) by mouth daily.  [name] aspirin 81 MG tablet Take 81 mg by  mouth daily.   . cyclobenzaprine (FLEXERIL) 5 MG tablet Take 1 tablet (5 mg total) by mouth 2 (two) times daily as needed for muscle spasms.  [name] ezetimibe (ZETIA) 10 MG tablet Take 1 tablet (10 mg total) by mouth daily.  . metFORMIN (GLUCOPHAGE) 1000 MG tablet Take 1 tablet (1,000 mg total) by mouth 2 (two) times daily with a meal.  . metoprolol succinate (TOPROL-XL) 25 MG 24 hr tablet TAKE 1 TABLET(25 MG) BY MOUTH AT BEDTIME  . nitroGLYCERIN (NITROSTAT) 0.4 MG SL tablet Place 1 tablet  (0.4 mg total) under the tongue every 5 (five) minutes as needed for chest pain. Maximum of 3 doses.  . pantoprazole (PROTONIX) 40 MG tablet TAKE 1 TABLET(40 MG) BY MOUTH DAILY  . QUEtiapine (SEROQUEL XR) 50 MG TB24 24 hr tablet Take 1 tablet (50 mg total) by mouth at bedtime.  . rosuvastatin (CRESTOR) 40 MG tablet Take 1 tablet (40 mg total) by mouth daily.     Allergies:   Penicillins   Social History   Tobacco Use  . Smoking status: Former Smoker    Packs/day: 1.00    Years: 14.00    Pack years: 14.00    Types: Cigarettes    Last attempt to quit: 06/2017    Years since quitting: 1.2  . Smokeless tobacco: Never Used  Substance Use Topics  . Alcohol use: Yes    Alcohol/week: 10.0 standard drinks    Types: 2 Cans of beer, 8 Shots of liquor per week  . Drug use: Yes    Frequency: 20.0 times per week    Types: Marijuana     Family Hx: The patient's family history includes Diabetes in his father and mother; Heart disease in his father, paternal grandmother, and paternal uncle; Hypertension in his father, mother, and sister.  ROS:   Please see the history of present illness.   All other systems reviewed and are negative.   Prior CV studies:   The following studies were reviewed today:  Cath/PCI:  LHC/PCI (10/14/2010): LMCA normal.  LAD with 95% proximal stenosis.  Small D1 with 99% proximal stenosis.  LCx with mild luminal irregularities.  RCA without significant disease.  LVEF 60%.  Successful albeit challenging PCI to the proximal LAD (difficult to find proper guide catheter) with placement of a 4.0 x 24 mm Veriflex bare-metal stent.  Non-Invasive Evaluation(s):  Pharmacologic MPI (10/13/2010): Stress only images obtained, demonstrating decreased uptake in the anterior wall suggestive of ischemia.  Labs/Other Tests and Data Reviewed:    EKG:  An ECG dated 06/24/2018 was personally reviewed today and demonstrated:  NSR with left axis deviation and non-specific T wave changes.   Recent Labs: 04/11/2018: Hemoglobin 13.3; Platelets 238; TSH 0.566 06/24/2018: ALT 31; BUN 10; Creatinine, Ser 1.09; Potassium 4.3; Sodium 141   Recent Lipid Panel Lab Results  Component Value Date/Time   CHOL 164 07/22/2018 09:40 AM   TRIG 139 07/22/2018 09:40 AM   HDL 48 07/22/2018 09:40 AM   CHOLHDL 3.4 07/22/2018 09:40 AM   CHOLHDL 4 11/23/2011 10:26 AM   LDLCALC 88 07/22/2018 09:40 AM   LDLDIRECT 129 (H) 06/24/2018 03:07 PM    Wt Readings from Last 3 Encounters:  09/19/18 280 lb (127 kg)  07/22/18 285 lb 6.4 oz (129.5 kg)  06/24/18 282 lb (127.9 kg)     Objective:    Vital Signs:  Ht '6\' 2"'  (1.88 m)   Wt 280 lb (127 kg)   BMI 35.95 kg/m  ASSESSMENT & PLAN:    Coronary artery disease: No frank chest pain or shortness of breath noted, which patient experienced leading up to his BMS placement.  Shoulder/arm pain is most consistent with musculoskeletal etiology, given that it occurs reliably when he pushes himself with weight training exercises.  We will plan to continue his current medications for secondary prevention.  I encouraged him to continue exercising regularly, including moderate intensity cardio work.  Hyperlipidemia: LDL above goal no check in 07/2018 by PCP (LDL 88), which prompted switch from atorvastatin to rosuvastatin.  Fasting lipid panel and ALT should be rechecked about 3 months after this switch was made (I will defer this to Ms. Terrilee Croak).  If LDL remains above 70, addition of a PCSK9 inhibitor will need to be considered.  Hypertension: Most recent home blood pressure was reasonable.  No medication changes at this time.  COVID-19 Education: The signs and symptoms of COVID-19 were discussed with the patient and how to seek care for testing (follow up with PCP or arrange E-visit).  The importance of social distancing was discussed today.  Time:   Today, I have spent 12 minutes with the patient with telehealth technology discussing the above problems.      Medication Adjustments/Labs and Tests Ordered: Current medicines are reviewed at length with the patient today.  Concerns regarding medicines are outlined above.   Tests Ordered: None.  Medication Changes: None.  Disposition:  Follow up in 6 month(s)  Signed, Nelva Bush, MD  09/19/2018 2:37 PM    [place]

## 2018-09-19 ENCOUNTER — Telehealth (INDEPENDENT_AMBULATORY_CARE_PROVIDER_SITE_OTHER): Admitting: Internal Medicine

## 2018-09-19 ENCOUNTER — Encounter: Payer: Self-pay | Admitting: Internal Medicine

## 2018-09-19 ENCOUNTER — Other Ambulatory Visit: Payer: Self-pay

## 2018-09-19 VITALS — Ht 74.0 in | Wt 280.0 lb

## 2018-09-19 DIAGNOSIS — Z7189 Other specified counseling: Secondary | ICD-10-CM | POA: Diagnosis not present

## 2018-09-19 DIAGNOSIS — I10 Essential (primary) hypertension: Secondary | ICD-10-CM

## 2018-09-19 DIAGNOSIS — E785 Hyperlipidemia, unspecified: Secondary | ICD-10-CM | POA: Insufficient documentation

## 2018-09-19 DIAGNOSIS — I2510 Atherosclerotic heart disease of native coronary artery without angina pectoris: Secondary | ICD-10-CM | POA: Insufficient documentation

## 2018-09-19 NOTE — Patient Instructions (Addendum)
Medication Instructions:  .isntcur  If you need a refill on your cardiac medications before your next appointment, please call your pharmacy.   Lab work: none If you have labs (blood work) drawn today and your tests are completely normal, you will receive your results only by: [name] MyChart Message (if you have MyChart) OR . A paper copy in the mail If you have any lab test that is abnormal or we need to change your treatment, we will call you to review the results.  Testing/Procedures: none  Follow-Up: At [facility], you and your health needs are our priority.  As part of our continuing mission to provide you with exceptional heart care, we have created designated Provider Care Teams.  These Care Teams include your primary Cardiologist (physician) and Advanced Practice Providers (APPs -  Physician Assistants and Nurse Practitioners) who all work together to provide you with the care you need, when you need it. You will need a follow up appointment in 6 months.  Please call our office 2 months in advance to schedule this appointment.  You may see Nelva Bush, MD or one of the following Advanced Practice Providers on your designated Care Team:   Murray Hodgkins, NP Christell Faith, PA-C . Marrianne Mood, PA-C

## 2018-10-10 ENCOUNTER — Other Ambulatory Visit: Payer: Self-pay | Admitting: Physician Assistant

## 2018-10-10 DIAGNOSIS — E1165 Type 2 diabetes mellitus with hyperglycemia: Secondary | ICD-10-CM

## 2018-10-10 DIAGNOSIS — I10 Essential (primary) hypertension: Secondary | ICD-10-CM

## 2018-10-20 ENCOUNTER — Ambulatory Visit: Admitting: Licensed Clinical Social Worker

## 2018-10-20 ENCOUNTER — Other Ambulatory Visit: Payer: Self-pay

## 2018-10-21 ENCOUNTER — Ambulatory Visit: Admitting: Physician Assistant

## 2018-10-21 NOTE — Progress Notes (Deleted)
Patient: [name] [name] Male    DOB: 07/24/1975   43 y.o.   MRN: [id number] Visit Date: 10/21/2018  Today's Provider: [name], PA-C   No chief complaint on file.  Subjective:     HPI  Diabetes Mellitus Type II, Follow-up:   Lab Results  Component Value Date   HGBA1C 7.5 (A) 07/22/2018   HGBA1C 7.9 (H) 04/11/2018   HGBA1C 6.6 (H) 11/23/2011   Last seen for diabetes 3 months ago.  Management since then includes working on lifestyle changes. He reports {excellent/good/fair/poor:19665} compliance with treatment. He {ACTION; IS/IS WFU:[id number]} having side effects. *** Current symptoms include {Symptoms; diabetes:14075} and have been {Desc; course:15616}. Home blood sugar records: {diabetes glucometry results:16657}  Episodes of hypoglycemia? {yes***/no:17258}   Current Insulin Regimen:N/A Most Recent Eye Exam: *** Weight trend: {trend:16658} Prior visit with dietician: no Current diet: {diet habits:16563} Current exercise: {exercise types:16438}  ------------------------------------------------------------------------   Hypertension, follow-up:  BP Readings from Last 3 Encounters:  07/22/18 130/88  06/24/18 120/90  06/17/18 (!) 148/86    He was last seen for hypertension 3 months ago.  BP at that visit was 130/888. Management since that visit includes none.He reports {excellent/good/fair/poor:19665} compliance with treatment. He {ACTION; IS/IS UKG:[id number]} having side effects. *** He {is/is not:9024} exercising. He {is/is not:9024} adherent to low salt diet.   Outside blood pressures are ***. He is experiencing {Symptoms; cardiac:12860}.  Patient denies {Symptoms; cardiac:12860}.   Cardiovascular risk factors include {cv risk factors:510}.  Use of agents associated with hypertension: {bp agents assoc with hypertension:511::"none"}.   ------------------------------------------------------------------------    Lipid/Cholesterol, Follow-up:    Last seen for this 3 months ago.  Management since that visit includes none.  Last Lipid Panel:    Component Value Date/Time   CHOL 164 07/22/2018 0940   TRIG 139 07/22/2018 0940   HDL 48 07/22/2018 0940   CHOLHDL 3.4 07/22/2018 0940   CHOLHDL 4 11/23/2011 1026   VLDL 40.0 11/23/2011 1026   LDLCALC 88 07/22/2018 0940   LDLDIRECT 129 (H) 06/24/2018 1507    He reports {excellent/good/fair/poor:19665} compliance with treatment. He {ACTION; IS/IS BJS:[id number]} having side effects. ***  Wt Readings from Last 3 Encounters:  09/19/18 280 lb (127 kg)  07/22/18 285 lb 6.4 oz (129.5 kg)  06/24/18 282 lb (127.9 kg)    ------------------------------------------------------------------------  Allergies  Allergen Reactions  . Penicillins      Current Outpatient Medications:  .  amLODipine (NORVASC) 10 MG tablet, TAKE 1 TABLET(10 MG) BY MOUTH DAILY, Disp: 90 tablet, Rfl: 0 .  aspirin 81 MG tablet, Take 81 mg by mouth daily. , Disp: , Rfl:  .  cyclobenzaprine (FLEXERIL) 5 MG tablet, Take 1 tablet (5 mg total) by mouth 2 (two) times daily as needed for muscle spasms., Disp: 20 tablet, Rfl: 0 .  ezetimibe (ZETIA) 10 MG tablet, Take 1 tablet (10 mg total) by mouth daily., Disp: 90 tablet, Rfl: 3 .  metFORMIN (GLUCOPHAGE) 1000 MG tablet, Take 1 tablet (1,000 mg total) by mouth 2 (two) times daily with a meal., Disp: 180 tablet, Rfl: 3 .  metoprolol succinate (TOPROL-XL) 25 MG 24 hr tablet, TAKE 1 TABLET(25 MG) BY MOUTH AT BEDTIME, Disp: 90 tablet, Rfl: 0 .  nitroGLYCERIN (NITROSTAT) 0.4 MG SL tablet, Place 1 tablet (0.4 mg total) under the tongue every 5 (five) minutes as needed for chest pain. Maximum of 3 doses., Disp: 25 tablet, Rfl: 4 .  pantoprazole (PROTONIX) 40 MG tablet, TAKE 1 TABLET(40 MG)  BY MOUTH DAILY, Disp: 90 tablet, Rfl: 0 .  QUEtiapine (SEROQUEL XR) 50 MG TB24 24 hr tablet, Take 1 tablet (50 mg total) by mouth at bedtime., Disp: 90 each, Rfl: 0 .  rosuvastatin (CRESTOR) 40  MG tablet, Take 1 tablet (40 mg total) by mouth daily., Disp: 90 tablet, Rfl: 1  Review of Systems  Social History   Tobacco Use  . Smoking status: Former Smoker    Packs/day: 1.00    Years: 14.00    Pack years: 14.00    Types: Cigarettes    Last attempt to quit: 06/2017    Years since quitting: 1.3  . Smokeless tobacco: Never Used  Substance Use Topics  . Alcohol use: Yes    Alcohol/week: 10.0 standard drinks    Types: 2 Cans of beer, 8 Shots of liquor per week      Objective:   There were no vitals taken for this visit. There were no vitals filed for this visit.   Physical Exam      Assessment & Plan        [name], PA-C  [facility] Fritzi Mandes [place] as a scribe for [name], PA-C.,have documented all relevant documentation on the behalf of [name], PA-C,as directed by  [name], PA-C while in the presence of [name], PA-C.

## 2018-10-26 ENCOUNTER — Ambulatory Visit (INDEPENDENT_AMBULATORY_CARE_PROVIDER_SITE_OTHER): Admitting: Licensed Clinical Social Worker

## 2018-10-26 ENCOUNTER — Other Ambulatory Visit: Payer: Self-pay

## 2018-10-26 DIAGNOSIS — F320 Major depressive disorder, single episode, mild: Secondary | ICD-10-CM

## 2018-10-26 NOTE — Progress Notes (Signed)
CCA not completed. Patient requested call back.

## 2018-11-21 ENCOUNTER — Other Ambulatory Visit: Payer: Self-pay | Admitting: Physician Assistant

## 2018-11-21 DIAGNOSIS — K219 Gastro-esophageal reflux disease without esophagitis: Secondary | ICD-10-CM

## 2018-11-21 DIAGNOSIS — I10 Essential (primary) hypertension: Secondary | ICD-10-CM

## 2018-11-21 NOTE — Telephone Encounter (Signed)
[name] 08/17/2018.

## 2018-12-20 ENCOUNTER — Ambulatory Visit (INDEPENDENT_AMBULATORY_CARE_PROVIDER_SITE_OTHER): Admitting: Physician Assistant

## 2018-12-20 ENCOUNTER — Other Ambulatory Visit: Payer: Self-pay

## 2018-12-20 ENCOUNTER — Encounter: Payer: Self-pay | Admitting: Physician Assistant

## 2018-12-20 VITALS — BP 131/89 | HR 71 | Temp 98.1°F | Resp 16 | Wt 277.0 lb

## 2018-12-20 DIAGNOSIS — Z2821 Immunization not carried out because of patient refusal: Secondary | ICD-10-CM | POA: Diagnosis not present

## 2018-12-20 DIAGNOSIS — E785 Hyperlipidemia, unspecified: Secondary | ICD-10-CM | POA: Diagnosis not present

## 2018-12-20 DIAGNOSIS — I10 Essential (primary) hypertension: Secondary | ICD-10-CM

## 2018-12-20 DIAGNOSIS — E1165 Type 2 diabetes mellitus with hyperglycemia: Secondary | ICD-10-CM | POA: Diagnosis not present

## 2018-12-20 DIAGNOSIS — K921 Melena: Secondary | ICD-10-CM | POA: Diagnosis not present

## 2018-12-20 DIAGNOSIS — K219 Gastro-esophageal reflux disease without esophagitis: Secondary | ICD-10-CM | POA: Diagnosis not present

## 2018-12-20 LAB — POCT GLYCOSYLATED HEMOGLOBIN (HGB A1C)
Est. average glucose Bld gHb Est-mCnc: 157
Hemoglobin A1C: 7.1 % — AB (ref 4.0–5.6)

## 2018-12-20 MED ORDER — PANTOPRAZOLE SODIUM 40 MG PO TBEC: DELAYED_RELEASE_TABLET | ORAL | 1 refills | Status: DC

## 2018-12-20 MED ORDER — METOPROLOL SUCCINATE ER 25 MG PO TB24: ORAL_TABLET | ORAL | 1 refills | Status: DC

## 2018-12-20 MED ORDER — AMLODIPINE BESYLATE 10 MG PO TABS: ORAL_TABLET | ORAL | 1 refills | Status: DC

## 2018-12-20 NOTE — Progress Notes (Signed)
Patient: [name] [name] Male    DOB: 20-Jul-1975   43 y.o.   MRN: [id number] Visit Date: 12/20/2018  Today's Provider: [name], PA-C   Chief Complaint  Patient presents with  . Diabetes   Subjective:     HPI  Diabetes Mellitus Type II, Follow-up:   Lab Results  Component Value Date   HGBA1C 7.1 (A) 12/20/2018     Last seen for diabetes 5 months ago.  Management since then includes continue Metformin 1000 mg BID and work on lifestyle changes. In the interim, he has been meeting with our CCM team to improve dietary and exercise habits.  He reports good compliance with treatment. He is not having side effects.  Current symptoms include none  Home blood sugar records: none Continues to decline Pneumovax.   Episodes of hypoglycemia? no              Current Insulin Regimen: none Most Recent Eye Exam: Not UTD Weight trend: stable Prior visit with dietician: no Current diet: diabetic diet Current exercise: walking, jogging  Wt Readings from Last 3 Encounters:  12/20/18 277 lb (125.6 kg)  09/19/18 280 lb (127 kg)  07/22/18 285 lb 6.4 oz (129.5 kg)   HTN: Currently on metoprolol succinate 25 mg daily and amlodipine 10 mg daily.   BP Readings from Last 3 Encounters:  12/20/18 131/89  07/22/18 130/88  06/24/18 120/90   HLD: His lipitor was changed to crestor last visit due to LDL above goal. He has a history of CAD stenting due to severe blockage in 2013. Per his most recent visit with cardiologist Dr. Saunders Revel, if his LDL is not to goal with Crestor he would be considered for PCSK 9 inhibitor.   Lipid Panel     Component Value Date/Time   CHOL 164 07/22/2018 0940   TRIG 139 07/22/2018 0940   HDL 48 07/22/2018 0940   CHOLHDL 3.4 07/22/2018 0940   CHOLHDL 4 11/23/2011 1026   VLDL 40.0 11/23/2011 1026   LDLCALC 88 07/22/2018 0940   LDLDIRECT 129 (H) 06/24/2018 1507    Mood disorder: Has started counseling and plans to continue. Psychiatry referral  unable to be completed because PCP has not been changed on insurance card. He will need to change this before referral can be made.  Rectal Bleeding: Was scheduled for colonoscopy but procedure was not done due to misunderstanding about fasting.   ------------------------------------------------------------------------  Allergies  Allergen Reactions  . Penicillins      Current Outpatient Medications:  .  amLODipine (NORVASC) 10 MG tablet, TAKE 1 TABLET(10 MG) BY MOUTH DAILY, Disp: 90 tablet, Rfl: 1 .  aspirin 81 MG tablet, Take 81 mg by mouth daily. , Disp: , Rfl:  .  cyclobenzaprine (FLEXERIL) 5 MG tablet, Take 1 tablet (5 mg total) by mouth 2 (two) times daily as needed for muscle spasms., Disp: 20 tablet, Rfl: 0 .  ezetimibe (ZETIA) 10 MG tablet, Take 1 tablet (10 mg total) by mouth daily., Disp: 90 tablet, Rfl: 3 .  metFORMIN (GLUCOPHAGE) 1000 MG tablet, Take 1 tablet (1,000 mg total) by mouth 2 (two) times daily with a meal., Disp: 180 tablet, Rfl: 3 .  metoprolol succinate (TOPROL-XL) 25 MG 24 hr tablet, TAKE 1 TABLET(25 MG) BY MOUTH AT BEDTIME, Disp: 90 tablet, Rfl: 1 .  nitroGLYCERIN (NITROSTAT) 0.4 MG SL tablet, Place 1 tablet (0.4 mg total) under the tongue every 5 (five) minutes as needed for chest pain. Maximum  of 3 doses., Disp: 25 tablet, Rfl: 4 .  pantoprazole (PROTONIX) 40 MG tablet, TAKE 1 TABLET(40 MG) BY MOUTH DAILY, Disp: 90 tablet, Rfl: 1 .  QUEtiapine (SEROQUEL XR) 50 MG TB24 24 hr tablet, Take 1 tablet (50 mg total) by mouth at bedtime., Disp: 90 each, Rfl: 0 .  rosuvastatin (CRESTOR) 40 MG tablet, Take 1 tablet (40 mg total) by mouth daily., Disp: 90 tablet, Rfl: 1  Review of Systems  Constitutional: Negative.   Respiratory: Negative.   Cardiovascular: Negative.   Musculoskeletal: Negative.     Social History   Tobacco Use  . Smoking status: Former Smoker    Packs/day: 1.00    Years: 14.00    Pack years: 14.00    Types: Cigarettes    Quit date: 06/2017      Years since quitting: 1.5  . Smokeless tobacco: Never Used  Substance Use Topics  . Alcohol use: Yes    Alcohol/week: 10.0 standard drinks    Types: 2 Cans of beer, 8 Shots of liquor per week      Objective:   BP 131/89 (BP Location: Left Arm, Patient Position: Sitting, Cuff Size: Large)   Pulse 71   Temp 98.1 F (36.7 C) (Oral)   Resp 16   Wt 277 lb (125.6 kg)   BMI 35.56 kg/m  Vitals:   12/20/18 0856  BP: 131/89  Pulse: 71  Resp: 16  Temp: 98.1 F (36.7 C)  TempSrc: Oral  Weight: 277 lb (125.6 kg)     Physical Exam   Results for orders placed or performed in visit on 12/20/18  POCT glycosylated hemoglobin (Hb A1C)  Result Value Ref Range   Hemoglobin A1C 7.1 (A) 4.0 - 5.6 %   Est. average glucose Bld gHb Est-mCnc 157        Assessment & Plan    1. Type 2 diabetes mellitus with hyperglycemia, without long-term current use of insulin (HCC)  Improved. No medication changes today. Follow up in 3 months.   - POCT glycosylated hemoglobin (Hb A1C) - Lipid Profile - amLODipine (NORVASC) 10 MG tablet; TAKE 1 TABLET(10 MG) BY MOUTH DAILY  Dispense: 90 tablet; Refill: 1  2. Hyperlipidemia LDL goal <70  Will check lipid profile. If not at goal, return to cardiology for consideration of PCSK9 inhibitor.  3. Essential hypertension  Controlled, continue current medications.  - amLODipine (NORVASC) 10 MG tablet; TAKE 1 TABLET(10 MG) BY MOUTH DAILY  Dispense: 90 tablet; Refill: 1  4. Hematochezia  - Ambulatory referral to Gastroenterology  5. Hypertension, unspecified type  - metoprolol succinate (TOPROL-XL) 25 MG 24 hr tablet; TAKE 1 TABLET(25 MG) BY MOUTH AT BEDTIME  Dispense: 90 tablet; Refill: 1  6. Gastroesophageal reflux disease, esophagitis presence not specified  - pantoprazole (PROTONIX) 40 MG tablet; TAKE 1 TABLET(40 MG) BY MOUTH DAILY  Dispense: 90 tablet; Refill: 1  7. Pneumococcal vaccination declined  Continues to refuse. I will continue to  ask about this.   The entirety of the information documented in the History of Present Illness, Review of Systems and Physical Exam were personally obtained by me. Portions of this information were initially documented by Lynford Humphrey, CMA and reviewed by me for thoroughness and accuracy.   F/u 3 months for chronic issues.       [name], PA-C  [facility]

## 2018-12-20 NOTE — Patient Instructions (Signed)
Change the PCP on  Your insurance card. Then we can schedule psychiatry.

## 2018-12-21 ENCOUNTER — Telehealth: Payer: Self-pay

## 2018-12-21 LAB — LIPID PANEL
Chol/HDL Ratio: 4 ratio (ref 0.0–5.0)
Cholesterol, Total: 159 mg/dL (ref 100–199)
HDL: 40 mg/dL (ref >39)
LDL Calculated: 69 mg/dL (ref 0–99)
Triglycerides: 252 mg/dL — ABNORMAL HIGH (ref 0–149)
VLDL Cholesterol Cal: 50 mg/dL — ABNORMAL HIGH (ref 5–40)

## 2018-12-21 NOTE — Telephone Encounter (Signed)
Pt called   He had been having a hard time getting touch with [place].  I could not get Marzetta Board so I gave him the message about his cholesterol and continue Crestor.   teri

## 2018-12-21 NOTE — Telephone Encounter (Signed)
-----   Message from [name], [place] sent at 12/21/2018  8:12 AM EDT ----- Cholesterol under control. Continue crestor.

## 2018-12-21 NOTE — Telephone Encounter (Signed)
Left message for patient to call back regarding lab results.

## 2019-01-26 ENCOUNTER — Ambulatory Visit: Admitting: Gastroenterology

## 2019-03-22 ENCOUNTER — Other Ambulatory Visit: Payer: Self-pay

## 2019-03-22 ENCOUNTER — Encounter: Payer: Self-pay | Admitting: Physician Assistant

## 2019-03-22 ENCOUNTER — Ambulatory Visit (INDEPENDENT_AMBULATORY_CARE_PROVIDER_SITE_OTHER): Admitting: Physician Assistant

## 2019-03-22 VITALS — BP 130/88 | HR 66 | Temp 96.9°F | Resp 16 | Wt 286.0 lb

## 2019-03-22 DIAGNOSIS — E1169 Type 2 diabetes mellitus with other specified complication: Secondary | ICD-10-CM

## 2019-03-22 DIAGNOSIS — E1165 Type 2 diabetes mellitus with hyperglycemia: Secondary | ICD-10-CM

## 2019-03-22 DIAGNOSIS — E785 Hyperlipidemia, unspecified: Secondary | ICD-10-CM | POA: Diagnosis not present

## 2019-03-22 DIAGNOSIS — E1142 Type 2 diabetes mellitus with diabetic polyneuropathy: Secondary | ICD-10-CM | POA: Insufficient documentation

## 2019-03-22 DIAGNOSIS — E66812 Obesity, class 2: Secondary | ICD-10-CM | POA: Insufficient documentation

## 2019-03-22 DIAGNOSIS — Z6837 Body mass index (BMI) 37.0-37.9, adult: Secondary | ICD-10-CM | POA: Insufficient documentation

## 2019-03-22 DIAGNOSIS — E1159 Type 2 diabetes mellitus with other circulatory complications: Secondary | ICD-10-CM

## 2019-03-22 DIAGNOSIS — I10 Essential (primary) hypertension: Secondary | ICD-10-CM | POA: Diagnosis not present

## 2019-03-22 LAB — POCT UA - MICROALBUMIN: Microalbumin Ur, POC: 20 mg/L

## 2019-03-22 LAB — POCT GLYCOSYLATED HEMOGLOBIN (HGB A1C)
Est. average glucose Bld gHb Est-mCnc: 163
Hemoglobin A1C: 7.3 % — AB (ref 4.0–5.6)

## 2019-03-22 NOTE — Progress Notes (Signed)
Patient: [name] [name] Male    DOB: [birth year]   43 y.o.   MRN: [id number] Visit Date: 03/22/2019  Today's Provider: [name], PA-C   Chief Complaint  Patient presents with  . Follow-up  . Diabetes  . Hypertension  . Hyperlipidemia   Subjective:     HPI    Diabetes Mellitus Type II, Follow-up:   Lab Results  Component Value Date   HGBA1C 7.3 (A) 03/22/2019   HGBA1C 7.1 (A) 12/20/2018   HGBA1C 7.5 (A) 07/22/2018   Last seen for diabetes 3 months ago.  Management since then includes; no changes. He reports fair compliance with treatment. He is taking metformin 1000 mg in the morning every three days or so. He feels when he takes it his sugars are higher and when he doesn't his sugars are lower. He has run out of strips and hasn't been checking his sugars.  He is not having side effects. none Current symptoms include none and have been unchanged. Home blood sugar records: fasting range: not checking  Episodes of hypoglycemia? no   Current Insulin Regimen: n/a Most Recent Eye Exam: Has never gotten. Referral placed 04/11/2018.  Weight trend: increasing steadily Prior visit with dietician: no Current diet: in general, an "unhealthy" diet Current exercise: none  -----------------------------------------------------------   Hypertension, follow-up:  BP Readings from Last 3 Encounters:  03/22/19 130/88  12/20/18 131/89  07/22/18 130/88    He was last seen for hypertension 3 months ago.  BP at that visit was 131/89. Management since that visit includes; no changes.He reports good compliance with treatment. He is not having side effects.  He is not exercising. He is not adherent to low salt diet.   Outside blood pressures are not checked. He is experiencing none.  Patient denies none.   Cardiovascular risk factors include diabetes mellitus, dyslipidemia, hypertension, male gender, obesity (BMI >= 30 kg/m2), sedentary lifestyle and smoking/ tobacco  exposure.  Use of agents associated with hypertension: none.   -----------------------------------------------------------    Lipid/Cholesterol, Follow-up:   Last seen for this 3 months ago.  Management since that visit includes; no changes.  Last Lipid Panel:    Component Value Date/Time   CHOL 159 12/20/2018 0925   TRIG 252 (H) 12/20/2018 0925   HDL 40 12/20/2018 0925   CHOLHDL 4.0 12/20/2018 0925   CHOLHDL 4 11/23/2011 1026   VLDL 40.0 11/23/2011 1026   LDLCALC 69 12/20/2018 0925   LDLDIRECT 129 (H) 06/24/2018 1507    He reports good compliance with treatment. He is not having side effects.   Wt Readings from Last 3 Encounters:  03/22/19 286 lb (129.7 kg)  12/20/18 277 lb (125.6 kg)  09/19/18 280 lb (127 kg)   -----------------------------------------------------------  Gastroesophageal reflux disease, esophagitis presence not specified From 12/20/2018-no changes.   Allergies  Allergen Reactions  . Penicillins      Current Outpatient Medications:  .  amLODipine (NORVASC) 10 MG tablet, TAKE 1 TABLET(10 MG) BY MOUTH DAILY, Disp: 90 tablet, Rfl: 1 .  aspirin 81 MG tablet, Take 81 mg by mouth daily. , Disp: , Rfl:  .  cyclobenzaprine (FLEXERIL) 5 MG tablet, Take 1 tablet (5 mg total) by mouth 2 (two) times daily as needed for muscle spasms., Disp: 20 tablet, Rfl: 0 .  ezetimibe (ZETIA) 10 MG tablet, Take 1 tablet (10 mg total) by mouth daily., Disp: 90 tablet, Rfl: 3 .  metFORMIN (GLUCOPHAGE) 1000 MG tablet, Take 1 tablet (1,000  mg total) by mouth 2 (two) times daily with a meal., Disp: 180 tablet, Rfl: 3 .  metoprolol succinate (TOPROL-XL) 25 MG 24 hr tablet, TAKE 1 TABLET(25 MG) BY MOUTH AT BEDTIME, Disp: 90 tablet, Rfl: 1 .  nitroGLYCERIN (NITROSTAT) 0.4 MG SL tablet, Place 1 tablet (0.4 mg total) under the tongue every 5 (five) minutes as needed for chest pain. Maximum of 3 doses., Disp: 25 tablet, Rfl: 4 .  pantoprazole (PROTONIX) 40 MG tablet, TAKE 1 TABLET(40  MG) BY MOUTH DAILY, Disp: 90 tablet, Rfl: 1 .  QUEtiapine (SEROQUEL XR) 50 MG TB24 24 hr tablet, Take 1 tablet (50 mg total) by mouth at bedtime., Disp: 90 each, Rfl: 0 .  rosuvastatin (CRESTOR) 40 MG tablet, Take 1 tablet (40 mg total) by mouth daily., Disp: 90 tablet, Rfl: 1  Review of Systems  Constitutional: Negative for appetite change, chills and fever.  Respiratory: Negative for chest tightness, shortness of breath and wheezing.   Cardiovascular: Negative for chest pain and palpitations.  Gastrointestinal: Negative for abdominal pain, nausea and vomiting.    Social History   Tobacco Use  . Smoking status: Former Smoker    Packs/day: 1.00    Years: 14.00    Pack years: 14.00    Types: Cigarettes    Quit date: 06/2017    Years since quitting: 1.7  . Smokeless tobacco: Never Used  Substance Use Topics  . Alcohol use: Yes    Alcohol/week: 10.0 standard drinks    Types: 2 Cans of beer, 8 Shots of liquor per week      Objective:   BP 130/88 (BP Location: Left Arm, Patient Position: Sitting, Cuff Size: Large)   Pulse 66   Temp (!) 96.9 F (36.1 C) (Other (Comment))   Resp 16   Wt 286 lb (129.7 kg)   SpO2 99%   BMI 36.72 kg/m  Vitals:   03/22/19 0914  BP: 130/88  Pulse: 66  Resp: 16  Temp: (!) 96.9 F (36.1 C)  TempSrc: Other (Comment)  SpO2: 99%  Weight: 286 lb (129.7 kg)  Body mass index is 36.72 kg/m.   Physical Exam Constitutional:      Appearance: Normal appearance. He is obese.  Cardiovascular:     Rate and Rhythm: Normal rate and regular rhythm.     Heart sounds: Normal heart sounds.  Pulmonary:     Effort: Pulmonary effort is normal.     Breath sounds: Normal breath sounds.  Skin:    General: Skin is warm and dry.  Neurological:     Mental Status: He is alert and oriented to person, place, and time. Mental status is at baseline.  Psychiatric:        Mood and Affect: Mood normal.        Behavior: Behavior normal.      Results for orders  placed or performed in visit on 03/22/19  POCT glycosylated hemoglobin (Hb A1C)  Result Value Ref Range   Hemoglobin A1C 7.3 (A) 4.0 - 5.6 %   Est. average glucose Bld gHb Est-mCnc 163   POCT UA - Microalbumin  Result Value Ref Range   Microalbumin Ur, POC 20 mg/L   Diabetic Foot Exam - Simple   Simple Foot Form Diabetic Foot exam was performed with the following findings: Yes 03/22/2019 10:54 AM  Visual Inspection No deformities, no ulcerations, no other skin breakdown bilaterally: Yes Sensation Testing See comments: Yes Pulse Check Posterior Tibialis and Dorsalis pulse intact bilaterally: Yes Comments Slightly  reduced sensation on great toes bilaterally.         Assessment & Plan    1. Type 2 diabetes mellitus with hyperglycemia, without long-term current use of insulin ([place])  He needs to take his metformin every day. Continues to refuse pneumonia shot. Referral placed for new eye visit. Hard script provided for strips and lancets. No Showed last GI visit, says symptoms are resolved.   - POCT glycosylated hemoglobin (Hb A1C) - POCT UA - Microalbumin - Ambulatory referral to Ophthalmology  2. Hyperlipidemia associated with type 2 diabetes mellitus ([facility])  Controlled, continue statin.  3. Essential hypertension  Controlled, continue medication.  4. Diabetic polyneuropathy associated with type 2 diabetes mellitus (HCC)  Slight neuropathy.   5. Class 2 severe obesity with serious comorbidity and body mass index (BMI) of 37.0 to 37.9 in adult, unspecified obesity type ([place])  Counseled on resuming exercise and dietary modifications.   The entirety of the information documented in the History of Present Illness, Review of Systems and Physical Exam were personally obtained by me. Portions of this information were initially documented by April M. Sabra Heck, CMA and reviewed by me for thoroughness and accuracy.   F/u 3 months for DM    [name], PA-C  [facility]

## 2019-03-22 NOTE — Patient Instructions (Signed)
Diabetes Mellitus and Exercise Exercising regularly is important for your overall health, especially when you have diabetes (diabetes mellitus). Exercising is not only about losing weight. It has many other health benefits, such as increasing muscle strength and bone density and reducing body fat and stress. This leads to improved fitness, flexibility, and endurance, all of which result in better overall health. Exercise has additional benefits for people with diabetes, including:  Reducing appetite.  Helping to lower and control blood glucose.  Lowering blood pressure.  Helping to control amounts of fatty substances (lipids) in the blood, such as cholesterol and triglycerides.  Helping the body to respond better to insulin (improving insulin sensitivity).  Reducing how much insulin the body needs.  Decreasing the risk for heart disease by: ? Lowering cholesterol and triglyceride levels. ? Increasing the levels of good cholesterol. ? Lowering blood glucose levels. What is my activity plan? Your health care provider or certified diabetes educator can help you make a plan for the type and frequency of exercise (activity plan) that works for you. Make sure that you:  Do at least 150 minutes of moderate-intensity or vigorous-intensity exercise each week. This could be brisk walking, biking, or water aerobics. ? Do stretching and strength exercises, such as yoga or weightlifting, at least 2 times a week. ? Spread out your activity over at least 3 days of the week.  Get some form of physical activity every day. ? Do not go more than 2 days in a row without some kind of physical activity. ? Avoid being inactive for more than 30 minutes at a time. Take frequent breaks to walk or stretch.  Choose a type of exercise or activity that you enjoy, and set realistic goals.  Start slowly, and gradually increase the intensity of your exercise over time. What do I need to know about managing my  diabetes?   Check your blood glucose before and after exercising. ? If your blood glucose is 240 mg/dL (13.3 mmol/L) or higher before you exercise, check your urine for ketones. If you have ketones in your urine, do not exercise until your blood glucose returns to normal. ? If your blood glucose is 100 mg/dL (5.6 mmol/L) or lower, eat a snack containing 15-20 grams of carbohydrate. Check your blood glucose 15 minutes after the snack to make sure that your level is above 100 mg/dL (5.6 mmol/L) before you start your exercise.  Know the symptoms of low blood glucose (hypoglycemia) and how to treat it. Your risk for hypoglycemia increases during and after exercise. Common symptoms of hypoglycemia can include: ? Hunger. ? Anxiety. ? Sweating and feeling clammy. ? Confusion. ? Dizziness or feeling light-headed. ? Increased heart rate or palpitations. ? Blurry vision. ? Tingling or numbness around the mouth, lips, or tongue. ? Tremors or shakes. ? Irritability.  Keep a rapid-acting carbohydrate snack available before, during, and after exercise to help prevent or treat hypoglycemia.  Avoid injecting insulin into areas of the body that are going to be exercised. For example, avoid injecting insulin into: ? The arms, when playing tennis. ? The legs, when jogging.  Keep records of your exercise habits. Doing this can help you and your health care provider adjust your diabetes management plan as needed. Write down: ? Food that you eat before and after you exercise. ? Blood glucose levels before and after you exercise. ? The type and amount of exercise you have done. ? When your insulin is expected to peak, if you use   insulin. Avoid exercising at times when your insulin is peaking.  When you start a new exercise or activity, work with your health care provider to make sure the activity is safe for you, and to adjust your insulin, medicines, or food intake as needed.  Drink plenty of water while  you exercise to prevent dehydration or heat stroke. Drink enough fluid to keep your urine clear or pale yellow. Summary  Exercising regularly is important for your overall health, especially when you have diabetes (diabetes mellitus).  Exercising has many health benefits, such as increasing muscle strength and bone density and reducing body fat and stress.  Your health care provider or certified diabetes educator can help you make a plan for the type and frequency of exercise (activity plan) that works for you.  When you start a new exercise or activity, work with your health care provider to make sure the activity is safe for you, and to adjust your insulin, medicines, or food intake as needed. This information is not intended to replace advice given to you by your health care provider. Make sure you discuss any questions you have with your health care provider. Document Released: 08/15/2003 Document Revised: 12/17/2016 Document Reviewed: 11/04/2015 Elsevier Patient Education  2020 [employer] Inc[employer]  

## 2019-04-06 ENCOUNTER — Other Ambulatory Visit: Payer: Self-pay | Admitting: Physician Assistant

## 2019-04-06 DIAGNOSIS — I10 Essential (primary) hypertension: Secondary | ICD-10-CM

## 2019-04-10 ENCOUNTER — Other Ambulatory Visit: Payer: Self-pay | Admitting: Physician Assistant

## 2019-04-10 DIAGNOSIS — K219 Gastro-esophageal reflux disease without esophagitis: Secondary | ICD-10-CM

## 2019-04-10 NOTE — Telephone Encounter (Signed)
[name] was on 03/22/2019.

## 2019-04-14 ENCOUNTER — Ambulatory Visit: Admitting: Internal Medicine

## 2019-04-14 NOTE — Progress Notes (Deleted)
Follow-up Outpatient Visit Date: 04/14/2019  Primary Care Provider: [name], PA-C [address] [place] [address]  Chief Complaint: ***  HPI:  [name]. [name] [name] is a 43 y.o. year-old male with history of coronary artery disease status post bare-metal stent to the LAD (2012), hypertension, hyperlipidemia, diabetes mellitus, obesity, ADHD, and polysubstance abuse, who presents for follow-up of coronary artery disease.  We last spoke in April, at which time [name] [name] was doing well.  He continued to have intermittent soreness in his shoulders with vigorous exercise.  Preceding exercise tolerance test for further evaluation of this showed no ischemic changes.  No medication changes or further testing were pursued at that time.  --------------------------------------------------------------------------------------------------  Cardiovascular History & Procedures: Cardiovascular Problems:  Known coronary artery disease status post PCI to the LAD (10/2010)  Risk Factors:  Known CAD, hypertension, hyperlipidemia, diabetes mellitus, obesity, and male gender  Cath/PCI:  LHC/PCI (10/14/2010): LMCA normal.  LAD with 95% proximal stenosis.  Small D1 with 99% proximal stenosis.  LCx with mild luminal irregularities.  RCA without significant disease.  LVEF 60%.  Successful albeit challenging PCI to the proximal LAD (difficult to find proper guide catheter) with placement of a 4.0 x 24 mm Veriflex bare-metal stent.  CV Surgery:  None  EP Procedures and Devices:  None  Non-Invasive Evaluation(s):  Exercise tolerance test (02/02/2018): Hypertensive blood pressure response.  Low risk study without ischemic EKG changes.  Pharmacologic MPI (10/13/2010): Stress only images obtained, demonstrating decreased uptake in the anterior wall suggestive of ischemia.   Recent CV Pertinent Labs: Lab Results  Component Value Date   CHOL 159 12/20/2018   HDL 40 12/20/2018   LDLCALC 69 12/20/2018   LDLDIRECT 129 (H) 06/24/2018   TRIG 252 (H) 12/20/2018   CHOLHDL 4.0 12/20/2018   CHOLHDL 4 11/23/2011   INR 0.89 10/13/2010   K 4.3 06/24/2018   BUN 10 06/24/2018   CREATININE 1.09 06/24/2018   CREATININE 0.78 10/13/2010    Past medical and surgical history were reviewed and updated in EPIC.  No outpatient medications have been marked as taking for the 04/14/19 encounter (Appointment) with , Harrell Gave, MD.    Allergies: Penicillins  Social History   Tobacco Use  . Smoking status: Former Smoker    Packs/day: 1.00    Years: 14.00    Pack years: 14.00    Types: Cigarettes    Quit date: 06/2017    Years since quitting: 1.8  . Smokeless tobacco: Never Used  Substance Use Topics  . Alcohol use: Yes    Alcohol/week: 10.0 standard drinks    Types: 2 Cans of beer, 8 Shots of liquor per week  . Drug use: Yes    Frequency: 20.0 times per week    Types: Marijuana    Family History  Problem Relation Age of Onset  . Hypertension Mother   . Diabetes Mother   . Hypertension Father   . Diabetes Father   . Heart disease Father        cabg 3; 1995; 18 stents  . Hypertension Sister   . Heart disease Paternal Uncle   . Heart disease Paternal Grandmother     Review of Systems: A 12-system review of systems was performed and was negative except as noted in the HPI.  --------------------------------------------------------------------------------------------------  Physical Exam: There were no vitals taken for this visit.  General:  *** HEENT: No conjunctival pallor or scleral icterus. Moist mucous membranes.  OP clear. Neck: Supple without  lymphadenopathy, thyromegaly, JVD, or HJR. No carotid bruit. Lungs: Normal work of breathing. Clear to auscultation bilaterally without wheezes or crackles. Heart: Regular rate and rhythm without murmurs, rubs, or gallops. Non-displaced PMI. Abd: Bowel sounds present. Soft, NT/ND without hepatosplenomegaly  Ext: No lower extremity edema. Radial, PT, and DP pulses are 2+ bilaterally. Skin: Warm and dry without rash.  EKG:  ***  Lab Results  Component Value Date   WBC 7.0 04/11/2018   HGB 13.3 04/11/2018   HCT 38.8 04/11/2018   MCV 88 04/11/2018   PLT 238 04/11/2018    Lab Results  Component Value Date   NA 141 06/24/2018   K 4.3 06/24/2018   CL 103 06/24/2018   CO2 19 (L) 06/24/2018   BUN 10 06/24/2018   CREATININE 1.09 06/24/2018   GLUCOSE 121 (H) 06/24/2018   ALT 31 06/24/2018    Lab Results  Component Value Date   CHOL 159 12/20/2018   HDL 40 12/20/2018   LDLCALC 69 12/20/2018   LDLDIRECT 129 (H) 06/24/2018   TRIG 252 (H) 12/20/2018   CHOLHDL 4.0 12/20/2018    --------------------------------------------------------------------------------------------------  ASSESSMENT AND PLAN: Harrell Gave , MD 04/14/2019 7:33 AM

## 2019-05-11 ENCOUNTER — Encounter: Payer: Self-pay | Admitting: Nurse Practitioner

## 2019-05-11 ENCOUNTER — Other Ambulatory Visit: Payer: Self-pay

## 2019-05-11 ENCOUNTER — Ambulatory Visit (INDEPENDENT_AMBULATORY_CARE_PROVIDER_SITE_OTHER): Admitting: Physician Assistant

## 2019-05-11 VITALS — BP 110/76 | HR 70 | Ht 75.0 in | Wt 280.0 lb

## 2019-05-11 DIAGNOSIS — E1165 Type 2 diabetes mellitus with hyperglycemia: Secondary | ICD-10-CM

## 2019-05-11 DIAGNOSIS — I10 Essential (primary) hypertension: Secondary | ICD-10-CM

## 2019-05-11 DIAGNOSIS — I2510 Atherosclerotic heart disease of native coronary artery without angina pectoris: Secondary | ICD-10-CM | POA: Diagnosis not present

## 2019-05-11 DIAGNOSIS — F1290 Cannabis use, unspecified, uncomplicated: Secondary | ICD-10-CM | POA: Diagnosis not present

## 2019-05-11 DIAGNOSIS — E785 Hyperlipidemia, unspecified: Secondary | ICD-10-CM | POA: Diagnosis not present

## 2019-05-11 DIAGNOSIS — K921 Melena: Secondary | ICD-10-CM

## 2019-05-11 NOTE — Patient Instructions (Signed)
Medication Instructions:  Your physician recommends that you continue on your current medications as directed. Please refer to the Current Medication list given to you today.  *If you need a refill on your cardiac medications before your next appointment, please call your pharmacy*  Lab Work: None ordered If you have labs (blood work) drawn today and your tests are completely normal, you will receive your results only by: [name] MyChart Message (if you have MyChart) OR . A paper copy in the mail If you have any lab test that is abnormal or we need to change your treatment, we will call you to review the results.  Testing/Procedures: None ordered  Follow-Up: At [facility], you and your health needs are our priority.  As part of our continuing mission to provide you with exceptional heart care, we have created designated Provider Care Teams.  These Care Teams include your primary Cardiologist (physician) and Advanced Practice Providers (APPs -  Physician Assistants and Nurse Practitioners) who all work together to provide you with the care you need, when you need it.  Your next appointment:   4 month(s)  The format for your next appointment:   In Person  Provider:    You may see Nelva Bush, MD or one of the following Advanced Practice Providers on your designated Care Team:    Murray Hodgkins, NP  Christell Faith, PA-C  Marrianne Mood, PA-C   Other Instructions N/A

## 2019-05-11 NOTE — Progress Notes (Signed)
Office Visit    Patient Name: [name] [name] Date of Encounter: 05/11/2019  Primary Care Provider:  [name], PA-C Primary Cardiologist:  Nelva Bush, MD  Chief Complaint    43 year old male with history of CAD s/p BMS to the LAD (2012), hypertension, hyperlipidemia, DM2, obesity, ADHD, and polysubstance abuse who is being seen today for 62-month follow-up of chest pain.  Past Medical History    Past Medical History:  Diagnosis Date   ADHD (attention deficit hyperactivity disorder)    Allergy    Arthritis    CAD (coronary artery disease)    S/P cath May 2012 with BMS to the LAD following abnormal stress test   Diabetes mellitus    Drug abuse ([place])    history of marijuana use and alcohol   GERD (gastroesophageal reflux disease)    Hypercholesterolemia    Hypertension    Myocardial infarction [facility])    Obesity    Oxygen deficiency    Sleep apnea    Past Surgical History:  Procedure Laterality Date   ABSCESS DRAINAGE     right arm; approx 2003   CARDIAC CATHETERIZATION     CORONARY ANGIOPLASTY     CORONARY STENT PLACEMENT  May 2012   LAD    Allergies  Allergies  Allergen Reactions   Penicillins     History of Present Illness    43 year old male with PMH as above and including chest pain, CAD s/p BMS to LAD (2012), and polysubstance abuse.  10/14/2010 cath showed pLAD 95%s s/p PCI/BMS, small D1 with 99%s, LCx with mild luminal irregularities, LVEF 60%. He underwent ETT 01/2018 that was ruled low risk but demonstrated a hypertensive response. He was last seen 09/2018 via telemedicine and noted occasional soreness in his shoulders with increased exercise (reported at previous visits and thought to be MSK in etiology). His atorvastatin had been changed to rosuvastatin by his PCP. Home BP was noted to be 127/73. It was noted that if repeat lipid and liver function labs continued to show LDL above 70, PCSK9 inhibitor should be considered.  However, 12/2018 LDL 69.  Since then, the patient has been doing well from a cardiac standpoint up until 2 days prior (12/1) when he felt L arm numbness. This numbness was noticed when he woke up for the day that lasted the entire day. He thought it might be associated with how he was sleeping, since he sleeps on his left arm; however, it did not dissipate throughout the day like in the past. No other associated sx. No identified exacerbating or alleviating sx. He did not take any SL nitro. He felt it was not related to working out, given that he had not worked out in over a week. The numbness lasted until the next morning (12/2) with complete resolution of the numbness after a night's sleep. Today in clinic, he noted concern that this numbness may be related to his heart; however, he also stated that this symptom was very different from those felt before his 2012 cath, at which time he experienced burning across his upper chest and SOB/DOE. No associated sx with his L arm numbness. No recent CP/burning, recurrent episodes of palpitations, feelings of racing HR, or SOB/DOE. When still working out (a couple of weeks ago), he did not have any concerning sx with physical, including CP or SOB. He also noted resolution of his shoulder discomfort, noted at previous appointments. He had completely stopped drinking alcohol and stated he was making changes  towards a more healthy lifestyle. He reported a desire to quit marijuana; however, at the time of this appointment, he was still smoking 3-4 times daily. His SBP was lower at home and ~120s mmHg (improved from 140s) since quitting drinking and starting amlodipine (added per PCP). He had not noticed BRBPR for weeks and since quitting smoking. He reported his colonoscopy was deferred due to missing steps for the prep; however, given his improved frequency of BRBPR, he was unsure if he'd go through with the procedure. He did note occasional LUQ "cramping," associated with  tight LUQ muscles, and both of which improved with "stretching it out;" however, this had not occurred in a few weeks as well. He noted recent weight gain (275  280lbs), which he attributed to increased caloric intake. He denied LEE, abdominal distention, early satiety, and orthopnea. He reported medication compliance.  Home Medications    Prior to Admission medications   Medication Sig Start Date End Date Taking? Authorizing Provider  amLODipine (NORVASC) 10 MG tablet TAKE 1 TABLET(10 MG) BY MOUTH DAILY 12/20/18   [name], PA-C  aspirin 81 MG tablet Take 81 mg by mouth daily.     [provider]  cyclobenzaprine (FLEXERIL) 5 MG tablet Take 1 tablet (5 mg total) by mouth 2 (two) times daily as needed for muscle spasms. 05/13/18   [name], PA-C  ezetimibe (ZETIA) 10 MG tablet Take 1 tablet (10 mg total) by mouth daily. 06/29/18 03/22/19  Rise Mu, PA-C  metFORMIN (GLUCOPHAGE) 1000 MG tablet Take 1 tablet (1,000 mg total) by mouth 2 (two) times daily with a meal. 07/28/18   [name], PA-C  metoprolol succinate (TOPROL-XL) 25 MG 24 hr tablet TAKE 1 TABLET(25 MG) BY MOUTH AT BEDTIME 04/06/19   [name] M, PA-C  nitroGLYCERIN (NITROSTAT) 0.4 MG SL tablet Place 1 tablet (0.4 mg total) under the tongue every 5 (five) minutes as needed for chest pain. Maximum of 3 doses. 04/11/18 03/22/19  [name], PA-C  pantoprazole (PROTONIX) 40 MG tablet TAKE 1 TABLET(40 MG) BY MOUTH DAILY 04/10/19   [name] M, PA-C  QUEtiapine (SEROQUEL XR) 50 MG TB24 24 hr tablet Take 1 tablet (50 mg total) by mouth at bedtime. 04/11/18 03/22/19  [name], PA-C  rosuvastatin (CRESTOR) 40 MG tablet Take 1 tablet (40 mg total) by mouth daily. 07/29/18   [name], PA-C    Review of Systems    He denies chest pain, palpitations, dyspnea, pnd, orthopnea, n, v, dizziness, syncope, edema, or early satiety. He does note weight gain, attributed to increased caloric  intake. He also notes BRBPR and LUQ cramping / muscle tightness; however, this has not occurred in several weeks. He recently had 1 day of L arm numbness as above.  All other systems reviewed and are otherwise negative except as noted above.  Physical Exam    VS:  BP 110/76 (BP Location: Left Arm, Patient Position: Sitting, Cuff Size: Large) Comment: Without meds   Pulse 70    Ht 6\' 3"  (1.905 m)    Wt 280 lb (127 kg)    BMI 35.00 kg/m  , BMI Body mass index is 35 kg/m. GEN: Well nourished, well developed, in no acute distress. HEENT: normal. Neck: Supple, no JVD, carotid bruits, or masses. Cardiac: RRR, no murmurs, rubs, or gallops. No clubbing, cyanosis, edema.  Radials/DP/PT 2+ and equal bilaterally.  Respiratory:  Respirations regular and unlabored, clear to auscultation bilaterally. GI:  Somewhat firm, nontender, nondistended, BS + x 4. MS: no deformity or atrophy. Skin: warm and dry, no rash. Neuro:  Strength and sensation are intact. Psych: Normal affect.  Accessory Clinical Findings    ECG personally reviewed by me today - NSR, 1st degree AVB, LAD with IVCD, nonspecific TWI abnormality unchanged from previous EKG- no acute changes.    Cardiovascular History & Procedures: Cardiovascular Problems:  Known coronary artery disease status post PCI to the LAD (10/2010)  Risk Factors:  Known CAD, hypertension, hyperlipidemia, diabetes mellitus, obesity, and male gender  Cath/PCI:  LHC/PCI (10/14/2010): LMCA normal.  LAD with 95% proximal stenosis.  Small D1 with 99% proximal stenosis.  LCx with mild luminal irregularities.  RCA without significant disease.  LVEF 60%.  Successful albeit challenging PCI to the proximal LAD (difficult to find proper guide catheter) with placement of a 4.0 x 24 mm Veriflex bare-metal stent.  CV Surgery:  None  EP Procedures and Devices:  None  Non-Invasive Evaluation(s):  Pharmacologic MPI (10/13/2010): Stress only images obtained,  demonstrating decreased uptake in the anterior wall suggestive of ischemia.  Assessment & Plan    1.  Coronary artery disease --No recent chest pain or sublingual nitroglycerin use.  2 days ago, patient had an episode of left arm numbness that lasted throughout the day but was not consistent with symptoms felt before his 2012 PCI.  L arm numbness atypical and different in nature from sx before his 2012 PCI. No associated sx. Will defer further ischemic workup at this time given atypical symptoms and as no symptoms with physical activity / workouts. Instructed to call with any new or worsening sx. REDs vest performed in office to assist with decision to obtain echo / updated EF (2012 EF nl), given patient report today of weight gain and firm abdomen. REDs score 30 and within normal range. Will defer echo.  --Continue current medications for secondary prevention.  He recently took a break from working out; however, he plans to continue regular exercise.  Continue lifestyle changes. Patient recently quit drinking alcohol and indicated a desire to quit marijuana as well. Continue statin as below.  2.  HTN --Controlled with BP soft today with patient indicating he had not taken his morning medications  and amlodipine recently added by his PCP. Patient denies any sx of presyncope or syncope and reports home SBP usually ~120s mmHg. Patient instructed to continue to monitor his BP at home. No medication changes at this time. Continue BB and amlodipine.   3. HLD --LDL below 70 per 12/2018 labs.  Liver function stable. Continue rosuvastatin and Zetia.  4. History of hematochezia --Obtain CBC, given patient reports he does not intend to follow-up with GI.  5. History of tobacco and alcohol use, current marijuana use --He recently quit drinking alcohol and continues to not smoke cigarettes. Indicated desire to quit smoking marijuana with encouragement as cessation advised.  6. DM2 --Not well controlled with  A1C 7.1. Glycemic control recommended. Per PCP.  Disposition: Follow-up in 4 months. Obtain CBC given h/o hematochezia as above.  Arvil Chaco, PA-C 05/11/2019, 12:50 PM

## 2019-05-12 LAB — CBC WITH DIFFERENTIAL/PLATELET
Basophils Absolute: 0 10*3/uL (ref 0.0–0.2)
Basos: 1 %
EOS (ABSOLUTE): 0.1 10*3/uL (ref 0.0–0.4)
Eos: 1 %
Hematocrit: 42.5 % (ref 37.5–51.0)
Hemoglobin: 14.8 g/dL (ref 13.0–17.7)
Immature Grans (Abs): 0 10*3/uL (ref 0.0–0.1)
Immature Granulocytes: 0 %
Lymphocytes Absolute: 3.4 10*3/uL — ABNORMAL HIGH (ref 0.7–3.1)
Lymphs: 45 %
MCH: 30.8 pg (ref 26.6–33.0)
MCHC: 34.8 g/dL (ref 31.5–35.7)
MCV: 88 fL (ref 79–97)
Monocytes Absolute: 0.5 10*3/uL (ref 0.1–0.9)
Monocytes: 7 %
Neutrophils Absolute: 3.4 10*3/uL (ref 1.4–7.0)
Neutrophils: 46 %
Platelets: 260 10*3/uL (ref 150–450)
RBC: 4.81 x10E6/uL (ref 4.14–5.80)
RDW: 13.7 % (ref 11.6–15.4)
WBC: 7.5 10*3/uL (ref 3.4–10.8)

## 2019-05-16 ENCOUNTER — Telehealth: Payer: Self-pay

## 2019-05-16 NOTE — Telephone Encounter (Signed)
-----   Message from Arvil Chaco, PA-C sent at 05/15/2019  5:50 PM EST ----- Please let Mr. Quesinberry know that his labs showed stable hemoglobin and red blood cell count. No additional recommendations.

## 2019-05-16 NOTE — Telephone Encounter (Signed)
Call attempted to review labs. No answer. LMTCB.

## 2019-05-18 ENCOUNTER — Other Ambulatory Visit: Payer: Self-pay | Admitting: Physician Assistant

## 2019-05-18 DIAGNOSIS — E1142 Type 2 diabetes mellitus with diabetic polyneuropathy: Secondary | ICD-10-CM

## 2019-05-18 DIAGNOSIS — E785 Hyperlipidemia, unspecified: Secondary | ICD-10-CM

## 2019-05-22 NOTE — Telephone Encounter (Signed)
Call to patient to review labs. No further questions or orders at this time.   Advised pt to call for any further questions or concerns.

## 2019-06-20 DIAGNOSIS — E119 Type 2 diabetes mellitus without complications: Secondary | ICD-10-CM | POA: Diagnosis not present

## 2019-06-20 LAB — HM DIABETES EYE EXAM

## 2019-06-21 NOTE — Progress Notes (Signed)
Patient: [name] [name] Male    DOB: 1975-08-01   44 y.o.   MRN: [id number] Visit Date: 06/22/2019  Today's Provider: [name], PA-C   Chief Complaint  Patient presents with  . Diabetes   Subjective:    I, Porsha McClurkin,CMA am acting as a scribe for [employer].   HPI  Diabetes Mellitus Type II, Follow-up:   Lab Results  Component Value Date   HGBA1C 9.5 (A) 06/22/2019   HGBA1C 7.3 (A) 03/22/2019   HGBA1C 7.1 (A) 12/20/2018    Last seen for diabetes 3 months ago.  Management since then includes no changes. Reported he has stopped drinking alcohol over the past few months. He reports he has increased eating his sweets.  He reports good compliance with treatment. He is not having side effects.  Current symptoms include none and have been stable. Home blood sugar records: not checking at home.  Episodes of hypoglycemia? no   Current insulin regiment: Is not on insulin Most Recent Eye Exam: UTD Weight trend: stable Prior visit with dietician: No Current exercise: none Current diet habits: well balanced  Pertinent Labs:    Component Value Date/Time   CHOL 159 12/20/2018 0925   TRIG 252 (H) 12/20/2018 0925   HDL 40 12/20/2018 0925   LDLCALC 69 12/20/2018 0925   CREATININE 1.09 06/24/2018 1507   CREATININE 0.78 10/13/2010 1727    Wt Readings from Last 3 Encounters:  06/22/19 282 lb (127.9 kg)  05/11/19 280 lb (127 kg)  03/22/19 286 lb (129.7 kg)    ------------------------------------------------------------------------   Allergies  Allergen Reactions  . Penicillins      Current Outpatient Medications:  .  amLODipine (NORVASC) 10 MG tablet, TAKE 1 TABLET(10 MG) BY MOUTH DAILY, Disp: 90 tablet, Rfl: 1 .  aspirin 81 MG tablet, Take 81 mg by mouth daily. , Disp: , Rfl:  .  cyclobenzaprine (FLEXERIL) 5 MG tablet, Take 1 tablet (5 mg total) by mouth 2 (two) times daily as needed for muscle spasms., Disp: 20 tablet, Rfl: 0 .   metFORMIN (GLUCOPHAGE) 1000 MG tablet, Take 1 tablet (1,000 mg total) by mouth 2 (two) times daily with a meal., Disp: 180 tablet, Rfl: 3 .  metoprolol succinate (TOPROL-XL) 25 MG 24 hr tablet, TAKE 1 TABLET(25 MG) BY MOUTH AT BEDTIME, Disp: 90 tablet, Rfl: 1 .  pantoprazole (PROTONIX) 40 MG tablet, TAKE 1 TABLET(40 MG) BY MOUTH DAILY, Disp: 90 tablet, Rfl: 1 .  rosuvastatin (CRESTOR) 40 MG tablet, TAKE 1 TABLET(40 MG) BY MOUTH DAILY, Disp: 90 tablet, Rfl: 1 .  ezetimibe (ZETIA) 10 MG tablet, Take 1 tablet (10 mg total) by mouth daily., Disp: 90 tablet, Rfl: 3 .  nitroGLYCERIN (NITROSTAT) 0.4 MG SL tablet, Place 1 tablet (0.4 mg total) under the tongue every 5 (five) minutes as needed for chest pain. Maximum of 3 doses., Disp: 25 tablet, Rfl: 4 .  QUEtiapine (SEROQUEL XR) 50 MG TB24 24 hr tablet, Take 1 tablet (50 mg total) by mouth at bedtime., Disp: 90 each, Rfl: 0  Review of Systems  Constitutional: Negative.   Respiratory: Negative.   Cardiovascular: Negative.   Neurological: Negative.     Social History   Tobacco Use  . Smoking status: Former Smoker    Packs/day: 1.00    Years: 14.00    Pack years: 14.00    Types: Cigarettes    Quit date: 06/2017    Years since quitting: 2.0  . Smokeless tobacco:  Never Used  Substance Use Topics  . Alcohol use: Yes    Alcohol/week: 10.0 standard drinks    Types: 2 Cans of beer, 8 Shots of liquor per week      Objective:   BP 130/88 (BP Location: Left Arm, Patient Position: Sitting, Cuff Size: Large)   Temp (!) 95.9 F (35.5 C) (Temporal)   Wt 282 lb (127.9 kg)   BMI 35.25 kg/m  Vitals:   06/22/19 0845  BP: 130/88  Temp: (!) 95.9 F (35.5 C)  TempSrc: Temporal  Weight: 282 lb (127.9 kg)  Body mass index is 35.25 kg/m.   Physical Exam   Results for orders placed or performed in visit on 06/22/19  POCT HgB A1C  Result Value Ref Range   Hemoglobin A1C 9.5 (A) 4.0 - 5.6 %   HbA1c POC (<> result, manual entry)     HbA1c, POC  (prediabetic range)     HbA1c, POC (controlled diabetic range)     Est. average glucose Bld gHb Est-mCnc 226        Assessment & Plan    1. Type 2 diabetes mellitus with hyperglycemia, without long-term current use of insulin (HCC)  Worse. Needs to work on diet and exercise. If not controlled at next visit, will add medication.   - POCT HgB A1C - amLODipine (NORVASC) 10 MG tablet; TAKE 1 TABLET(10 MG) BY MOUTH DAILY  Dispense: 90 tablet; Refill: 1  2. Essential hypertension  - amLODipine (NORVASC) 10 MG tablet; TAKE 1 TABLET(10 MG) BY MOUTH DAILY  Dispense: 90 tablet; Refill: 1  3. Type 2 diabetes mellitus with diabetic polyneuropathy, without long-term current use of insulin (HCC)  - metFORMIN (GLUCOPHAGE) 1000 MG tablet; Take 1 tablet (1,000 mg total) by mouth 2 (two) times daily with a meal.  Dispense: 180 tablet; Refill: 3  4. Chest pain, unspecified type  - nitroGLYCERIN (NITROSTAT) 0.4 MG SL tablet; Place 1 tablet (0.4 mg total) under the tongue every 5 (five) minutes as needed for chest pain. Maximum of 3 doses.  Dispense: 25 tablet; Refill: 4  5. Depression, unspecified depression type  - QUEtiapine (SEROQUEL XR) 50 MG TB24 24 hr tablet; Take 1 tablet (50 mg total) by mouth at bedtime.  Dispense: 30 tablet; Refill: 2  6. Hyperlipidemia LDL goal <70  Continue statin.   The entirety of the information documented in the History of Present Illness, Review of Systems and Physical Exam were personally obtained by me. Portions of this information were initially documented by [facility] and reviewed by me for thoroughness and accuracy.   Follow up in 3 months DM, HTN, HLD.       [name], PA-C  [facility]

## 2019-06-22 ENCOUNTER — Encounter: Payer: Self-pay | Admitting: Physician Assistant

## 2019-06-22 ENCOUNTER — Other Ambulatory Visit: Payer: Self-pay

## 2019-06-22 ENCOUNTER — Ambulatory Visit (INDEPENDENT_AMBULATORY_CARE_PROVIDER_SITE_OTHER): Admitting: Physician Assistant

## 2019-06-22 VITALS — BP 130/88 | Temp 95.9°F | Wt 282.0 lb

## 2019-06-22 DIAGNOSIS — F329 Major depressive disorder, single episode, unspecified: Secondary | ICD-10-CM

## 2019-06-22 DIAGNOSIS — E1165 Type 2 diabetes mellitus with hyperglycemia: Secondary | ICD-10-CM

## 2019-06-22 DIAGNOSIS — F32A Depression, unspecified: Secondary | ICD-10-CM

## 2019-06-22 DIAGNOSIS — E1142 Type 2 diabetes mellitus with diabetic polyneuropathy: Secondary | ICD-10-CM

## 2019-06-22 DIAGNOSIS — R079 Chest pain, unspecified: Secondary | ICD-10-CM | POA: Diagnosis not present

## 2019-06-22 DIAGNOSIS — I10 Essential (primary) hypertension: Secondary | ICD-10-CM

## 2019-06-22 DIAGNOSIS — E785 Hyperlipidemia, unspecified: Secondary | ICD-10-CM

## 2019-06-22 LAB — POCT GLYCOSYLATED HEMOGLOBIN (HGB A1C)
Est. average glucose Bld gHb Est-mCnc: 226
Hemoglobin A1C: 9.5 % — AB (ref 4.0–5.6)

## 2019-06-22 MED ORDER — NITROGLYCERIN 0.4 MG SL SUBL: 0.4000 mg | SUBLINGUAL_TABLET | SUBLINGUAL | 4 refills | Status: DC | PRN

## 2019-06-22 MED ORDER — QUETIAPINE FUMARATE ER 50 MG PO TB24: 50.0000 mg | ORAL_TABLET | Freq: Every day | ORAL | 2 refills | Status: DC

## 2019-06-22 MED ORDER — AMLODIPINE BESYLATE 10 MG PO TABS: ORAL_TABLET | ORAL | 1 refills | Status: DC

## 2019-06-22 MED ORDER — METFORMIN HCL 1000 MG PO TABS: 1000.0000 mg | ORAL_TABLET | Freq: Two times a day (BID) | ORAL | 3 refills | Status: DC

## 2019-06-22 NOTE — Patient Instructions (Signed)
Diabetes Mellitus and Exercise Exercising regularly is important for your overall health, especially when you have diabetes (diabetes mellitus). Exercising is not only about losing weight. It has many other health benefits, such as increasing muscle strength and bone density and reducing body fat and stress. This leads to improved fitness, flexibility, and endurance, all of which result in better overall health. Exercise has additional benefits for people with diabetes, including:  Reducing appetite.  Helping to lower and control blood glucose.  Lowering blood pressure.  Helping to control amounts of fatty substances (lipids) in the blood, such as cholesterol and triglycerides.  Helping the body to respond better to insulin (improving insulin sensitivity).  Reducing how much insulin the body needs.  Decreasing the risk for heart disease by: ? Lowering cholesterol and triglyceride levels. ? Increasing the levels of good cholesterol. ? Lowering blood glucose levels. What is my activity plan? Your health care provider or certified diabetes educator can help you make a plan for the type and frequency of exercise (activity plan) that works for you. Make sure that you:  Do at least 150 minutes of moderate-intensity or vigorous-intensity exercise each week. This could be brisk walking, biking, or water aerobics. ? Do stretching and strength exercises, such as yoga or weightlifting, at least 2 times a week. ? Spread out your activity over at least 3 days of the week.  Get some form of physical activity every day. ? Do not go more than 2 days in a row without some kind of physical activity. ? Avoid being inactive for more than 30 minutes at a time. Take frequent breaks to walk or stretch.  Choose a type of exercise or activity that you enjoy, and set realistic goals.  Start slowly, and gradually increase the intensity of your exercise over time. What do I need to know about managing my  diabetes?   Check your blood glucose before and after exercising. ? If your blood glucose is 240 mg/dL (13.3 mmol/L) or higher before you exercise, check your urine for ketones. If you have ketones in your urine, do not exercise until your blood glucose returns to normal. ? If your blood glucose is 100 mg/dL (5.6 mmol/L) or lower, eat a snack containing 15-20 grams of carbohydrate. Check your blood glucose 15 minutes after the snack to make sure that your level is above 100 mg/dL (5.6 mmol/L) before you start your exercise.  Know the symptoms of low blood glucose (hypoglycemia) and how to treat it. Your risk for hypoglycemia increases during and after exercise. Common symptoms of hypoglycemia can include: ? Hunger. ? Anxiety. ? Sweating and feeling clammy. ? Confusion. ? Dizziness or feeling light-headed. ? Increased heart rate or palpitations. ? Blurry vision. ? Tingling or numbness around the mouth, lips, or tongue. ? Tremors or shakes. ? Irritability.  Keep a rapid-acting carbohydrate snack available before, during, and after exercise to help prevent or treat hypoglycemia.  Avoid injecting insulin into areas of the body that are going to be exercised. For example, avoid injecting insulin into: ? The arms, when playing tennis. ? The legs, when jogging.  Keep records of your exercise habits. Doing this can help you and your health care provider adjust your diabetes management plan as needed. Write down: ? Food that you eat before and after you exercise. ? Blood glucose levels before and after you exercise. ? The type and amount of exercise you have done. ? When your insulin is expected to peak, if you use   insulin. Avoid exercising at times when your insulin is peaking.  When you start a new exercise or activity, work with your health care provider to make sure the activity is safe for you, and to adjust your insulin, medicines, or food intake as needed.  Drink plenty of water while  you exercise to prevent dehydration or heat stroke. Drink enough fluid to keep your urine clear or pale yellow. Summary  Exercising regularly is important for your overall health, especially when you have diabetes (diabetes mellitus).  Exercising has many health benefits, such as increasing muscle strength and bone density and reducing body fat and stress.  Your health care provider or certified diabetes educator can help you make a plan for the type and frequency of exercise (activity plan) that works for you.  When you start a new exercise or activity, work with your health care provider to make sure the activity is safe for you, and to adjust your insulin, medicines, or food intake as needed. This information is not intended to replace advice given to you by your health care provider. Make sure you discuss any questions you have with your health care provider. Document Revised: 12/17/2016 Document Reviewed: 11/04/2015 Elsevier Patient Education  2020 Elsevier Inc[employer]  

## 2019-08-24 IMAGING — CR DG CHEST 2V
1 series · 2 of 2 positions shown · non-contrast
Comparison: Chest x-ray of 07/14/2017

CLINICAL DATA: Shortness of breath, sore throat for 2 days, cough,
former smoking history

EXAM:
CHEST - 2 VIEW

[Series 1: dg chest 2 view · 0.14mm/px · 2 of 2 slices shown]
[im 1/2]
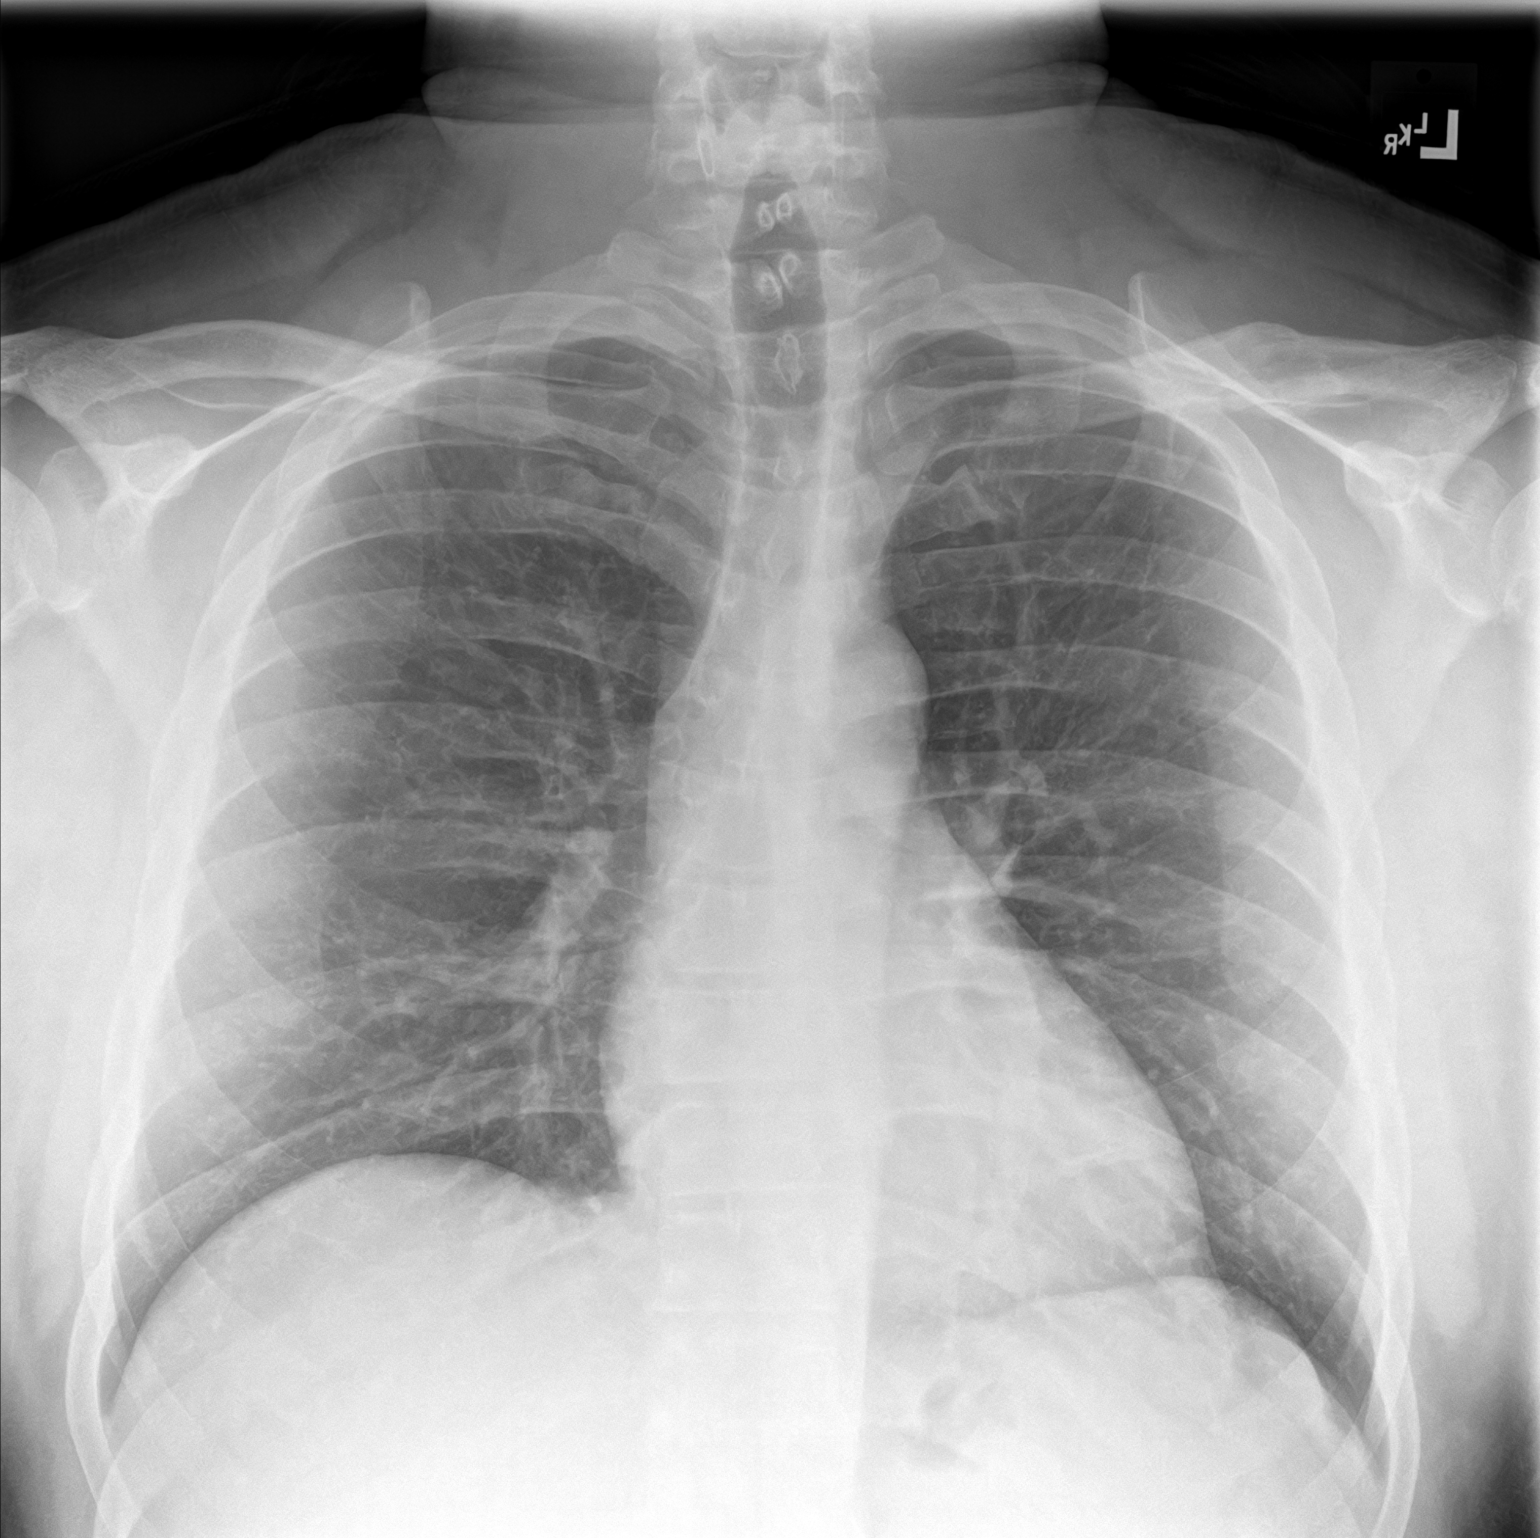
[im 2/2]
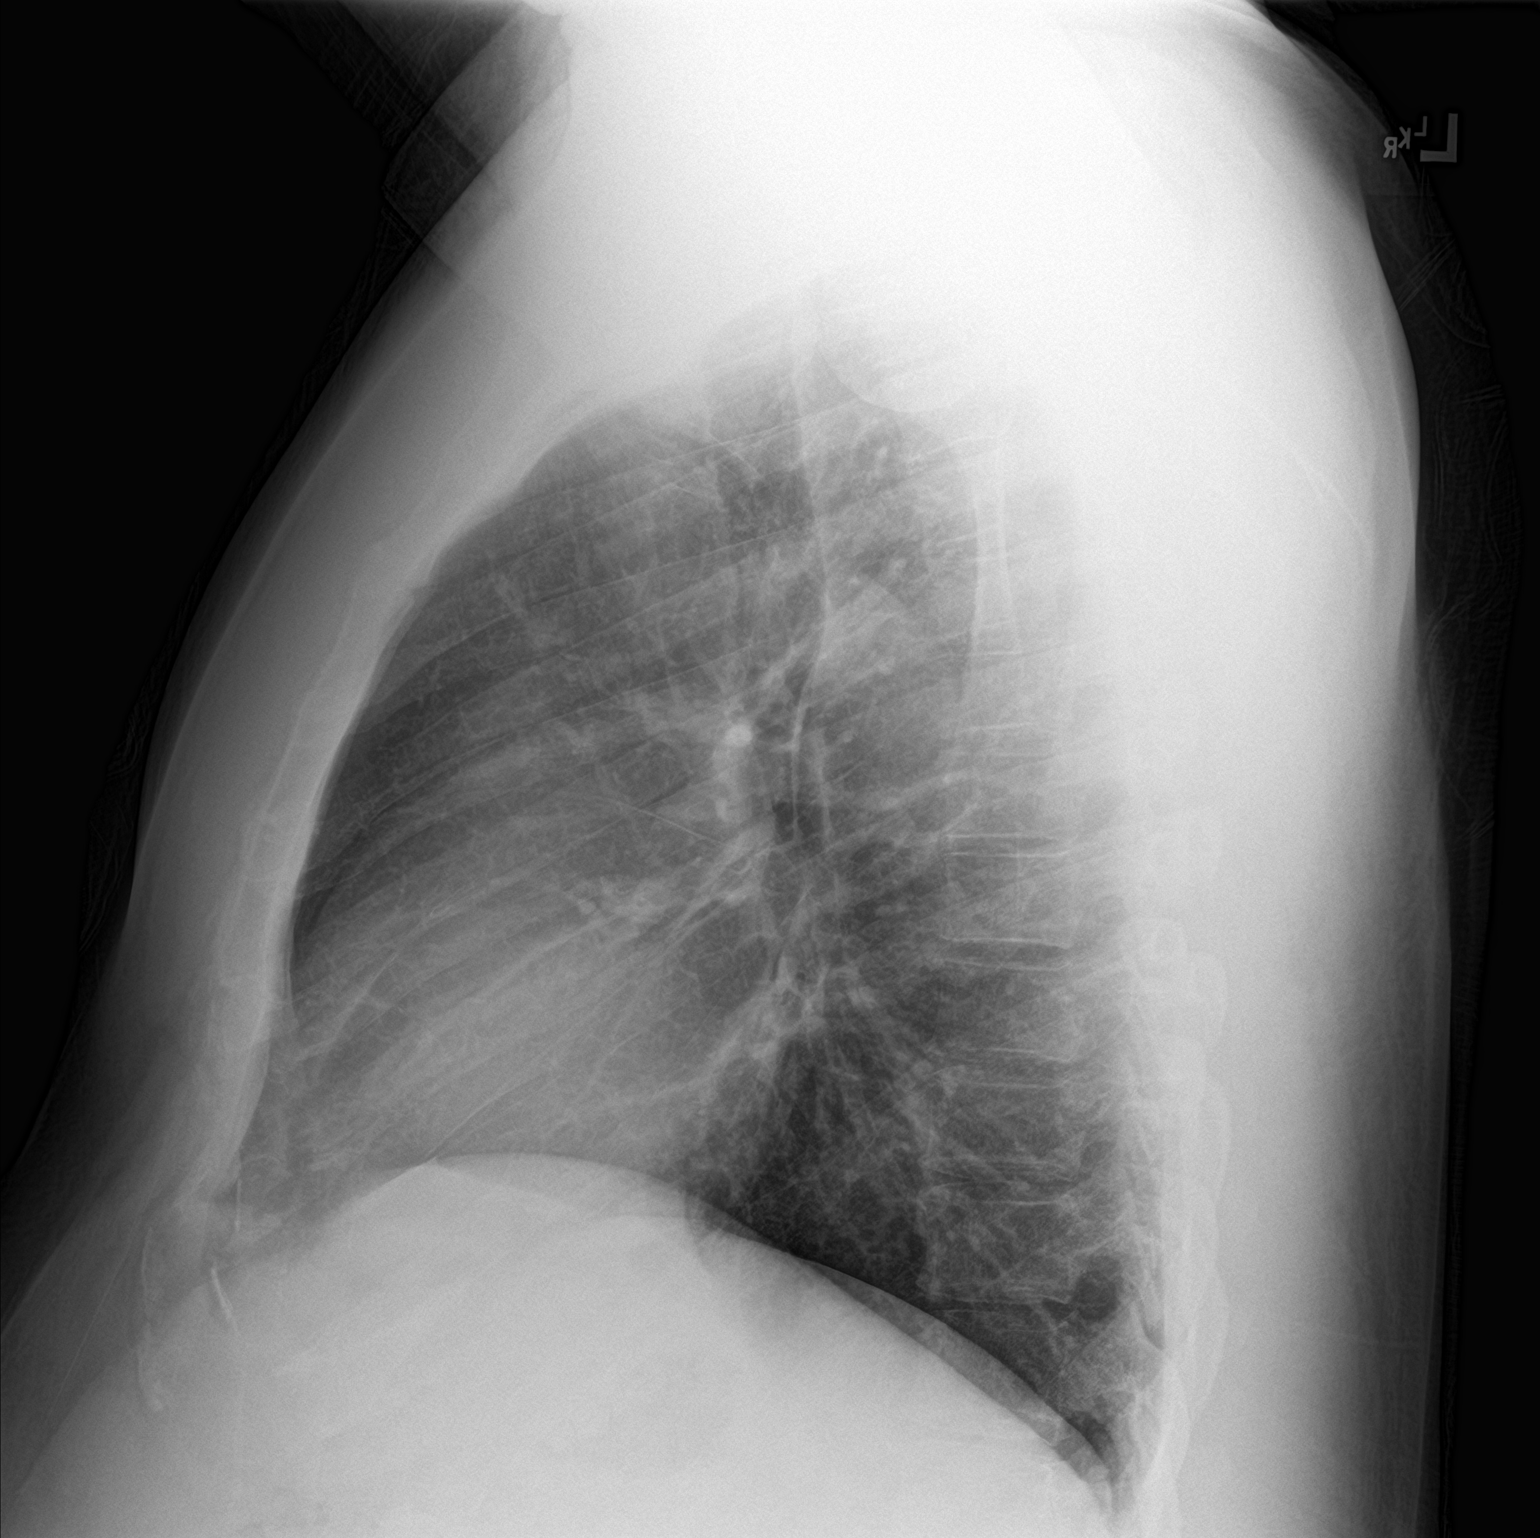

[2 of 2 positions shown; findings below may reference images not displayed]

FINDINGS: No active infiltrate or effusion is seen. Mediastinal and hilar
contours are unremarkable. There is mild peribronchial thickening
which may indicate bronchitis. The heart is within normal limits in
size. No bony abnormality is seen.
IMPRESSION: No pneumonia or pleural effusion.  Question bronchitis.

## 2019-09-14 ENCOUNTER — Ambulatory Visit (INDEPENDENT_AMBULATORY_CARE_PROVIDER_SITE_OTHER): Admitting: Internal Medicine

## 2019-09-14 ENCOUNTER — Other Ambulatory Visit: Payer: Self-pay

## 2019-09-14 ENCOUNTER — Encounter: Payer: Self-pay | Admitting: Internal Medicine

## 2019-09-14 VITALS — BP 130/88 | HR 72 | Ht 75.0 in | Wt 273.0 lb

## 2019-09-14 DIAGNOSIS — E785 Hyperlipidemia, unspecified: Secondary | ICD-10-CM | POA: Diagnosis not present

## 2019-09-14 DIAGNOSIS — I10 Essential (primary) hypertension: Secondary | ICD-10-CM

## 2019-09-14 DIAGNOSIS — G4733 Obstructive sleep apnea (adult) (pediatric): Secondary | ICD-10-CM

## 2019-09-14 DIAGNOSIS — E1165 Type 2 diabetes mellitus with hyperglycemia: Secondary | ICD-10-CM | POA: Diagnosis not present

## 2019-09-14 DIAGNOSIS — I2510 Atherosclerotic heart disease of native coronary artery without angina pectoris: Secondary | ICD-10-CM

## 2019-09-14 NOTE — Patient Instructions (Signed)
Medication Instructions:  Your physician recommends that you continue on your current medications as directed. Please refer to the Current Medication list given to you today.  *If you need a refill on your cardiac medications before your next appointment, please call your pharmacy*  Follow-Up: At [facility], you and your health needs are our priority.  As part of our continuing mission to provide you with exceptional heart care, we have created designated Provider Care Teams.  These Care Teams include your primary Cardiologist (physician) and Advanced Practice Providers (APPs -  Physician Assistants and Nurse Practitioners) who all work together to provide you with the care you need, when you need it.  We recommend signing up for the patient portal called "MyChart".  Sign up information is provided on this After Visit Summary.  MyChart is used to connect with patients for Virtual Visits (Telemedicine).  Patients are able to view lab/test results, encounter notes, upcoming appointments, etc.  Non-urgent messages can be sent to your provider as well.   To learn more about what you can do with MyChart, go to [email or web address].    Your next appointment:   12 month(s)  The format for your next appointment:   In Person  Provider:    You may see Nelva Bush, MD or one of the following Advanced Practice Providers on your designated Care Team:    Murray Hodgkins, NP  Christell Faith, PA-C  Marrianne Mood, PA-C    Other Instructions Info on [place] handout provided.

## 2019-09-14 NOTE — Progress Notes (Signed)
Follow-up Outpatient Visit Date: 09/14/2019  Primary Care Provider: [name], PA-C [address] [place] [address]  Chief Complaint: Follow-up coronary artery disease  HPI:  [name] [name] is a 44 y.o. male with history of coronary artery disease status post bare-metal stent to the LAD (2012), hypertension, hyperlipidemia, diabetes mellitus, obesity, ADHD, and polysubstance abuse, who presents for follow-up of coronary artery disease.  He was last seen in our office in 05/2019 by Marrianne Mood, PA, at which time he complained of left arm numbness.  He was concerned that this could be related to his heart, though symptoms at the time of his PCI in 2012 were notably different.  Given the low risk stress test in 01/2018, further testing was deferred.  Today, [name] [name] reports that he has been feeling well.  His left shoulder pain has been less frequent and seems to be related to weight lifting.  He has not had any chest pain, shortness of breath, palpitations, lightheadedness, or edema.  Home blood pressures are typically around 130/80.  He notes occasional joint pain in his wrists and elbows and wonders if it could be related to metformin.  He also feels like metformin actually raises his blood sugar.  Hemoglobin A1c was noted to be 9.5 in January.  He attributes some of this to dietary indiscretion and weight gain during the COVID-19 pandemic and the holidays.  He has not been using CPAP regularly.  He does not add salt to his food but notes that some of the food that he consumes on a regular basis contains quite a bit of sodium.  --------------------------------------------------------------------------------------------------  Past Medical History:  Diagnosis Date  . ADHD (attention deficit hyperactivity disorder)   . Allergy   . Arthritis   . CAD (coronary artery disease)    S/P cath May 2012 with BMS to the LAD following abnormal stress test  . Diabetes  mellitus   . Drug abuse ([place])    history of marijuana use and alcohol  . GERD (gastroesophageal reflux disease)   . Hypercholesterolemia   . Hypertension   . Myocardial infarction ([place])   . Obesity   . Oxygen deficiency   . Sleep apnea    Past Surgical History:  Procedure Laterality Date  . ABSCESS DRAINAGE     right arm; approx 2003  . CARDIAC CATHETERIZATION    . CORONARY ANGIOPLASTY    . CORONARY STENT PLACEMENT  May 2012   LAD    Current Meds  Medication Sig  . amLODipine (NORVASC) 10 MG tablet TAKE 1 TABLET(10 MG) BY MOUTH DAILY  . aspirin 81 MG tablet Take 81 mg by mouth daily.   . cyclobenzaprine (FLEXERIL) 5 MG tablet Take 1 tablet (5 mg total) by mouth 2 (two) times daily as needed for muscle spasms.  [name] ezetimibe (ZETIA) 10 MG tablet Take 1 tablet (10 mg total) by mouth daily.  . metFORMIN (GLUCOPHAGE) 1000 MG tablet Take 1 tablet (1,000 mg total) by mouth 2 (two) times daily with a meal. (Patient taking differently: Take 1,000 mg by mouth daily. )  . metoprolol succinate (TOPROL-XL) 25 MG 24 hr tablet TAKE 1 TABLET(25 MG) BY MOUTH AT BEDTIME  . nitroGLYCERIN (NITROSTAT) 0.4 MG SL tablet Place 1 tablet (0.4 mg total) under the tongue every 5 (five) minutes as needed for chest pain. Maximum of 3 doses.  . pantoprazole (PROTONIX) 40 MG tablet TAKE 1 TABLET(40 MG) BY MOUTH DAILY  . QUEtiapine (SEROQUEL XR) 50  MG TB24 24 hr tablet Take 1 tablet (50 mg total) by mouth at bedtime.  . rosuvastatin (CRESTOR) 40 MG tablet TAKE 1 TABLET(40 MG) BY MOUTH DAILY    Allergies: Penicillins  Social History   Tobacco Use  . Smoking status: Former Smoker    Packs/day: 1.00    Years: 14.00    Pack years: 14.00    Types: Cigarettes    Quit date: 06/2017    Years since quitting: 2.2  . Smokeless tobacco: Never Used  Substance Use Topics  . Alcohol use: Yes    Alcohol/week: 10.0 standard drinks    Types: 2 Cans of beer, 8 Shots of liquor per week  . Drug use: Yes    Frequency:  20.0 times per week    Types: Marijuana    Family History  Problem Relation Age of Onset  . Hypertension Mother   . Diabetes Mother   . Hypertension Father   . Diabetes Father   . Heart disease Father        cabg 3; 1995; 18 stents  . Hypertension Sister   . Heart disease Paternal Uncle   . Heart disease Paternal Grandmother     Review of Systems: A 12-system review of systems was performed and was negative except as noted in the HPI.  --------------------------------------------------------------------------------------------------  Physical Exam: BP 130/88 (BP Location: Left Arm, Patient Position: Sitting, Cuff Size: Large)   Pulse 72   Ht 6\' 3"  (1.905 m)   Wt 273 lb (123.8 kg)   SpO2 98%   BMI 34.12 kg/m   General: NAD. Neck: No JVD or HJR. Lungs: Clear to auscultation bilaterally without wheezes or crackles. Heart: Regular rate and rhythm without murmurs, rubs, or gallops. Abdomen: Soft, nontender, nondistended. Extremities: 2+ radial pulses.  No lower extremity edema.  EKG: Normal sinus rhythm with first-degree AV block and inferior Q waves.  No significant change since 05/11/2019.  Lab Results  Component Value Date   WBC 7.5 05/11/2019   HGB 14.8 05/11/2019   HCT 42.5 05/11/2019   MCV 88 05/11/2019   PLT 260 05/11/2019    Lab Results  Component Value Date   NA 141 06/24/2018   K 4.3 06/24/2018   CL 103 06/24/2018   CO2 19 (L) 06/24/2018   BUN 10 06/24/2018   CREATININE 1.09 06/24/2018   GLUCOSE 121 (H) 06/24/2018   ALT 31 06/24/2018    Lab Results  Component Value Date   CHOL 159 12/20/2018   HDL 40 12/20/2018   LDLCALC 69 12/20/2018   LDLDIRECT 129 (H) 06/24/2018   TRIG 252 (H) 12/20/2018   CHOLHDL 4.0 12/20/2018    --------------------------------------------------------------------------------------------------  ASSESSMENT AND PLAN: Coronary artery disease: [name] [name] does not report any symptoms to suggest worsening coronary  insufficiency.  We will continue his current medications for secondary prevention, including aspirin 81 mg daily and rosuvastatin 40 mg daily.  Hypertension: Blood pressure mildly elevated today (goal less than 130/80).  We have agreed to defer medication changes, continue his current regimen of amlodipine and metoprolol.  We will need to monitor his PR interval given mild first-degree AV block that has been stable on recent EKGs.  I encouraged [name] [name] to minimize his sodium intake.  I have provided him with information about the DASH diet.  If blood pressure remains above 130/80 when he is seen for follow-up next month by his PCP, addition of an ACE inhibitor or ARB should be considered, given history of concurrent diabetes mellitus.  Hyperlipidemia: LDL at goal on last check in 12/2018.  Continue rosuvastatin 40 mg daily.  Triglycerides also noted to be elevated at that time.  If they remain elevated after working on lifestyle modifications, addition of Vascepa may need to be considered in the future.  Type 2 diabetes mellitus: Hemoglobin A1c quite elevated on last check in January.  I have encouraged [name] [name] to work on lifestyle modifications as well as to continue his metformin.  He should follow-up as planned with his PCP next month for further management.  Obstructive sleep apnea: Ms. [name] has not been using CPAP regularly.  I encouraged him to be compliant, as this could be contributing to his elevated blood pressure.  Follow-up: Return to clinic in 1 year.  Nelva Bush, MD 09/14/2019 10:59 AM

## 2019-09-20 ENCOUNTER — Other Ambulatory Visit: Payer: Self-pay

## 2019-09-20 ENCOUNTER — Ambulatory Visit (INDEPENDENT_AMBULATORY_CARE_PROVIDER_SITE_OTHER): Admitting: Physician Assistant

## 2019-09-20 ENCOUNTER — Encounter: Payer: Self-pay | Admitting: Physician Assistant

## 2019-09-20 VITALS — BP 140/88 | HR 74 | Temp 95.9°F | Wt 271.6 lb

## 2019-09-20 DIAGNOSIS — Z6837 Body mass index (BMI) 37.0-37.9, adult: Secondary | ICD-10-CM | POA: Diagnosis not present

## 2019-09-20 DIAGNOSIS — M545 Low back pain, unspecified: Secondary | ICD-10-CM

## 2019-09-20 DIAGNOSIS — M62838 Other muscle spasm: Secondary | ICD-10-CM | POA: Diagnosis not present

## 2019-09-20 DIAGNOSIS — E1165 Type 2 diabetes mellitus with hyperglycemia: Secondary | ICD-10-CM

## 2019-09-20 DIAGNOSIS — M25512 Pain in left shoulder: Secondary | ICD-10-CM | POA: Diagnosis not present

## 2019-09-20 LAB — POCT GLYCOSYLATED HEMOGLOBIN (HGB A1C)
Est. average glucose Bld gHb Est-mCnc: 298
Hemoglobin A1C: 12 % — AB (ref 4.0–5.6)

## 2019-09-20 MED ORDER — TRULICITY 0.75 MG/0.5ML ~~LOC~~ SOAJ: 0.7500 mL | SUBCUTANEOUS | 0 refills | Status: DC

## 2019-09-20 NOTE — Patient Instructions (Signed)
Diabetes Mellitus and Exercise Exercising regularly is important for your overall health, especially when you have diabetes (diabetes mellitus). Exercising is not only about losing weight. It has many other health benefits, such as increasing muscle strength and bone density and reducing body fat and stress. This leads to improved fitness, flexibility, and endurance, all of which result in better overall health. Exercise has additional benefits for people with diabetes, including:  Reducing appetite.  Helping to lower and control blood glucose.  Lowering blood pressure.  Helping to control amounts of fatty substances (lipids) in the blood, such as cholesterol and triglycerides.  Helping the body to respond better to insulin (improving insulin sensitivity).  Reducing how much insulin the body needs.  Decreasing the risk for heart disease by: ? Lowering cholesterol and triglyceride levels. ? Increasing the levels of good cholesterol. ? Lowering blood glucose levels. What is my activity plan? Your health care provider or certified diabetes educator can help you make a plan for the type and frequency of exercise (activity plan) that works for you. Make sure that you:  Do at least 150 minutes of moderate-intensity or vigorous-intensity exercise each week. This could be brisk walking, biking, or water aerobics. ? Do stretching and strength exercises, such as yoga or weightlifting, at least 2 times a week. ? Spread out your activity over at least 3 days of the week.  Get some form of physical activity every day. ? Do not go more than 2 days in a row without some kind of physical activity. ? Avoid being inactive for more than 30 minutes at a time. Take frequent breaks to walk or stretch.  Choose a type of exercise or activity that you enjoy, and set realistic goals.  Start slowly, and gradually increase the intensity of your exercise over time. What do I need to know about managing my  diabetes?   Check your blood glucose before and after exercising. ? If your blood glucose is 240 mg/dL (13.3 mmol/L) or higher before you exercise, check your urine for ketones. If you have ketones in your urine, do not exercise until your blood glucose returns to normal. ? If your blood glucose is 100 mg/dL (5.6 mmol/L) or lower, eat a snack containing 15-20 grams of carbohydrate. Check your blood glucose 15 minutes after the snack to make sure that your level is above 100 mg/dL (5.6 mmol/L) before you start your exercise.  Know the symptoms of low blood glucose (hypoglycemia) and how to treat it. Your risk for hypoglycemia increases during and after exercise. Common symptoms of hypoglycemia can include: ? Hunger. ? Anxiety. ? Sweating and feeling clammy. ? Confusion. ? Dizziness or feeling light-headed. ? Increased heart rate or palpitations. ? Blurry vision. ? Tingling or numbness around the mouth, lips, or tongue. ? Tremors or shakes. ? Irritability.  Keep a rapid-acting carbohydrate snack available before, during, and after exercise to help prevent or treat hypoglycemia.  Avoid injecting insulin into areas of the body that are going to be exercised. For example, avoid injecting insulin into: ? The arms, when playing tennis. ? The legs, when jogging.  Keep records of your exercise habits. Doing this can help you and your health care provider adjust your diabetes management plan as needed. Write down: ? Food that you eat before and after you exercise. ? Blood glucose levels before and after you exercise. ? The type and amount of exercise you have done. ? When your insulin is expected to peak, if you use   insulin. Avoid exercising at times when your insulin is peaking.  When you start a new exercise or activity, work with your health care provider to make sure the activity is safe for you, and to adjust your insulin, medicines, or food intake as needed.  Drink plenty of water while  you exercise to prevent dehydration or heat stroke. Drink enough fluid to keep your urine clear or pale yellow. Summary  Exercising regularly is important for your overall health, especially when you have diabetes (diabetes mellitus).  Exercising has many health benefits, such as increasing muscle strength and bone density and reducing body fat and stress.  Your health care provider or certified diabetes educator can help you make a plan for the type and frequency of exercise (activity plan) that works for you.  When you start a new exercise or activity, work with your health care provider to make sure the activity is safe for you, and to adjust your insulin, medicines, or food intake as needed. This information is not intended to replace advice given to you by your health care provider. Make sure you discuss any questions you have with your health care provider. Document Revised: 12/17/2016 Document Reviewed: 11/04/2015 Elsevier Patient Education  2020 Elsevier Inc[employer]  

## 2019-09-20 NOTE — Progress Notes (Addendum)
Established patient visit     Patient: [name] [name]   DOB: 06/30/1975   44 y.o. Male  MRN: [id number] Visit Date: 09/20/2019  Today's healthcare provider: [name], PA-C  Subjective:    [name] [name],acting as a scribe for [name], PA-C.,have documented all relevant documentation on the behalf of [name], PA-C,as directed by  [name], PA-C while in the presence of [name], PA-C.  Chief Complaint  Patient presents with  . Diabetes   Diabetes He presents for his follow-up diabetic visit. He has type 2 diabetes mellitus. His disease course has been stable. Pertinent negatives for hypoglycemia include no dizziness, headaches, hunger, sleepiness or sweats. Pertinent negatives for diabetes include no blurred vision, no chest pain, no fatigue, no foot ulcerations, no visual change and no weakness. There are no hypoglycemic complications. Symptoms are stable. There are no diabetic complications. Risk factors for coronary artery disease include diabetes mellitus, hypertension, male sex and obesity. Current diabetic treatment includes oral agent (monotherapy). He is compliant with treatment all of the time. His weight is decreasing rapidly. He is following a low salt diet. When asked about meal planning, he reported none. He has not had a previous visit with a dietitian. His lunch blood glucose range is generally 180-200 mg/dl. His dinner blood glucose range is generally 180-200 mg/dl. His overall blood glucose range is 180-200 mg/dl. He does not see a podiatrist.Eye exam is current ([facility]:06/20/2019, no diabetic retinopathy).     Medications: Outpatient Medications Prior to Visit  Medication Sig  . amLODipine (NORVASC) 10 MG tablet TAKE 1 TABLET(10 MG) BY MOUTH DAILY  . aspirin 81 MG tablet Take 81 mg by mouth daily.   . cyclobenzaprine (FLEXERIL) 5 MG tablet Take 1 tablet (5 mg total) by mouth 2 (two) times daily as needed for  muscle spasms.  . metFORMIN (GLUCOPHAGE) 1000 MG tablet Take 1 tablet (1,000 mg total) by mouth 2 (two) times daily with a meal. (Patient taking differently: Take 1,000 mg by mouth daily. )  . metoprolol succinate (TOPROL-XL) 25 MG 24 hr tablet TAKE 1 TABLET(25 MG) BY MOUTH AT BEDTIME  . nitroGLYCERIN (NITROSTAT) 0.4 MG SL tablet Place 1 tablet (0.4 mg total) under the tongue every 5 (five) minutes as needed for chest pain. Maximum of 3 doses.  . pantoprazole (PROTONIX) 40 MG tablet TAKE 1 TABLET(40 MG) BY MOUTH DAILY  . QUEtiapine (SEROQUEL XR) 50 MG TB24 24 hr tablet Take 1 tablet (50 mg total) by mouth at bedtime.  . rosuvastatin (CRESTOR) 40 MG tablet TAKE 1 TABLET(40 MG) BY MOUTH DAILY  . ezetimibe (ZETIA) 10 MG tablet Take 1 tablet (10 mg total) by mouth daily.   No facility-administered medications prior to visit.    Review of Systems  Constitutional: Negative.  Negative for fatigue.  Eyes: Negative for blurred vision.  Respiratory: Negative.   Cardiovascular: Negative for chest pain and leg swelling.  Musculoskeletal: Negative.   Neurological: Negative.  Negative for dizziness, weakness and headaches.  Hematological: Does not bruise/bleed easily.        Objective:    BP 140/88 (BP Location: Right Arm, Patient Position: Sitting, Cuff Size: Large)   Pulse 74   Temp (!) 95.9 F (35.5 C) (Temporal)   Wt 271 lb 9.6 oz (123.2 kg)   SpO2 99%   BMI 33.95 kg/m    Physical Exam Cardiovascular:     Rate and Rhythm: Normal rate and regular  rhythm.  Skin:    General: Skin is warm and dry.  Neurological:     General: No focal deficit present.     Mental Status: He is oriented to person, place, and time.  Psychiatric:        Mood and Affect: Mood normal.        Behavior: Behavior normal.       Results for orders placed or performed in visit on 09/20/19  POCT glycosylated hemoglobin (Hb A1C)  Result Value Ref Range   Hemoglobin A1C 12.0 (A) 4.0 - 5.6 %   HbA1c POC (<>  result, manual entry)     HbA1c, POC (prediabetic range)     HbA1c, POC (controlled diabetic range)     Est. average glucose Bld gHb Est-mCnc 298       Assessment & Plan:     1. Type 2 diabetes mellitus with hyperglycemia, without long-term current use of insulin (HCC) Uncontrolled with last A1c 12.0 Continue current medications add Trulicity 0.75MG  subq once weekly   2 pens sample of Trulicity was given and demonstrated first dose in office. UTD on eye exam, foot exam and refuses pneumonia vaccine On ACEi On Statin Discussed diet and exercise especially reducing carbs F/u in 3 months  - POCT glycosylated hemoglobin (Hb A1C) - Dulaglutide (TRULICITY) [id number] SOPN; Inject 0.75 mLs into the skin once a week.  Dispense: 5 pen; Refill: 0  2. Muscle spasm Referred to othro  3. Acute midline low back pain without sciatica   4. Left shoulder pain, unspecified chronicity  - Ambulatory referral to Orthopedic Surgery  Return in about 3 months (around 12/20/2019) for DM.      Paulene Floor  [facility] [phone] (phone) [phone] (fax)  [place]

## 2019-10-04 ENCOUNTER — Other Ambulatory Visit: Payer: Self-pay | Admitting: Physician Assistant

## 2019-10-04 DIAGNOSIS — E1165 Type 2 diabetes mellitus with hyperglycemia: Secondary | ICD-10-CM

## 2019-10-04 NOTE — Telephone Encounter (Signed)
[name] [name], [name] [name] (spouse) states medication request was sent in several days ago thru the pharmacy (chart doesn't reflect) and patient will be out today. Informed the caller please allow 48 to 72 hour turn around. Spouse would likerequest expedited.   Medication Refill - Medication:   Dulaglutide (TRULICITY) [id number] SOPN    Has the patient contacted their pharmacy? Yes.    Preferred Pharmacy (with phone number or street name): [facility] [id number], [place] Phone:  [phone]  Fax:  [phone]       Agent: Please be advised that RX refills may take up to 3 business days. We ask that you follow-up with your pharmacy.

## 2019-10-05 DIAGNOSIS — M5030 Other cervical disc degeneration, unspecified cervical region: Secondary | ICD-10-CM | POA: Diagnosis not present

## 2019-10-05 DIAGNOSIS — M25512 Pain in left shoulder: Secondary | ICD-10-CM | POA: Diagnosis not present

## 2019-10-05 DIAGNOSIS — M5136 Other intervertebral disc degeneration, lumbar region: Secondary | ICD-10-CM | POA: Diagnosis not present

## 2019-10-05 DIAGNOSIS — M5412 Radiculopathy, cervical region: Secondary | ICD-10-CM | POA: Diagnosis not present

## 2019-10-13 ENCOUNTER — Telehealth: Payer: Self-pay

## 2019-10-13 DIAGNOSIS — E1165 Type 2 diabetes mellitus with hyperglycemia: Secondary | ICD-10-CM

## 2019-10-13 MED ORDER — NOVOFINE 32G X 6 MM MISC: 1 refills | Status: DC

## 2019-10-13 MED ORDER — VICTOZA 18 MG/3ML ~~LOC~~ SOPN: PEN_INJECTOR | SUBCUTANEOUS | 1 refills | Status: DC

## 2019-10-13 NOTE — Telephone Encounter (Signed)
Spoke with patient's wife Rolland Bimler) and she states they received call that the Trucility was denied. I advised her that Victoza was send into the pharmacy for the patient.

## 2019-10-13 NOTE — Telephone Encounter (Signed)
Spoke with the representative w/ [employer] about patients prior authorization and patient PA for trulicity was denied. Patient would have to try Victoza first. Please advise.

## 2019-10-13 NOTE — Telephone Encounter (Signed)
Insurance has denied trulicity. It requires him to try victoza first. This is a similar medicine that is given once daily. He should wait one week from his last trulicity dose before starting victoza. The pen is reusable and requires you to exchange needles which I have sent in. We will slowly increase the dose and this is written on the label.

## 2019-10-17 DIAGNOSIS — M542 Cervicalgia: Secondary | ICD-10-CM | POA: Diagnosis not present

## 2019-11-03 DIAGNOSIS — M542 Cervicalgia: Secondary | ICD-10-CM | POA: Diagnosis not present

## 2019-11-10 DIAGNOSIS — M542 Cervicalgia: Secondary | ICD-10-CM | POA: Diagnosis not present

## 2019-11-18 ENCOUNTER — Other Ambulatory Visit: Payer: Self-pay | Admitting: Physician Assistant

## 2019-11-18 DIAGNOSIS — E1165 Type 2 diabetes mellitus with hyperglycemia: Secondary | ICD-10-CM

## 2019-11-18 DIAGNOSIS — E1142 Type 2 diabetes mellitus with diabetic polyneuropathy: Secondary | ICD-10-CM

## 2019-11-18 DIAGNOSIS — E785 Hyperlipidemia, unspecified: Secondary | ICD-10-CM

## 2019-11-18 DIAGNOSIS — I10 Essential (primary) hypertension: Secondary | ICD-10-CM

## 2019-11-18 NOTE — Telephone Encounter (Signed)
Requested Prescriptions  Pending Prescriptions Disp Refills  . rosuvastatin (CRESTOR) 40 MG tablet [Pharmacy Med Name: ROSUVASTATIN 40MG  TABLETS] 90 tablet 0    Sig: TAKE 1 TABLET(40 MG) BY MOUTH DAILY     Cardiovascular:  Antilipid - Statins Failed - 11/18/2019 10:06 AM      Failed - Triglycerides in normal range and within 360 days    Triglycerides  Date Value Ref Range Status  12/20/2018 252 (H) 0 - 149 mg/dL Final         Passed - Total Cholesterol in normal range and within 360 days    Cholesterol, Total  Date Value Ref Range Status  12/20/2018 159 100 - 199 mg/dL Final         Passed - LDL in normal range and within 360 days    LDL Calculated  Date Value Ref Range Status  12/20/2018 69 0 - 99 mg/dL Final   LDL Direct  Date Value Ref Range Status  06/24/2018 129 (H) 0 - 99 mg/dL Final         Passed - HDL in normal range and within 360 days    HDL  Date Value Ref Range Status  12/20/2018 40 >39 mg/dL Final         Passed - Patient is not pregnant      Passed - Valid encounter within last 12 months    Recent Outpatient Visits          1 month ago Type 2 diabetes mellitus with hyperglycemia, without long-term current use of insulin [facility] [place] [facility])   [place], Adriana M, PA-C   4 months ago Type 2 diabetes mellitus with hyperglycemia, without long-term current use of insulin [facility])   [place], [place], PA-C   8 months ago Type 2 diabetes mellitus with hyperglycemia, without long-term current use of insulin [facility])   [facility] [place], Fabio Bering M, [place]   11 months ago Type 2 diabetes mellitus with hyperglycemia, without long-term current use of insulin [facility])   [facility] [place], [place], PA-C   1 year ago Type 2 diabetes mellitus with diabetic polyneuropathy, without long-term current use of insulin [facility])   [place], Wendee Beavers, [place]      Future Appointments             In 1 month Terrilee Croak, Wendee Beavers, PA-C [employer]

## 2019-11-18 NOTE — Telephone Encounter (Signed)
Requested Prescriptions  Pending Prescriptions Disp Refills   amLODipine (NORVASC) 10 MG tablet [Pharmacy Med Name: AMLODIPINE BESYLATE 10MG  TABLETS] 90 tablet 1    Sig: TAKE 1 TABLET(10 MG) BY MOUTH DAILY     Cardiovascular:  Calcium Channel Blockers Failed - 11/18/2019  8:59 AM      Failed - Last BP in normal range    BP Readings from Last 1 Encounters:  09/20/19 140/88         Passed - Valid encounter within last 6 months    Recent Outpatient Visits          1 month ago Type 2 diabetes mellitus with hyperglycemia, without long-term current use of insulin [facility])   [place], Adriana M, PA-C   4 months ago Type 2 diabetes mellitus with hyperglycemia, without long-term current use of insulin [facility])   [place], [place], PA-C   8 months ago Type 2 diabetes mellitus with hyperglycemia, without long-term current use of insulin [facility])   [facility] [place], Fabio Bering M, [place]   11 months ago Type 2 diabetes mellitus with hyperglycemia, without long-term current use of insulin [facility])   [facility] [place], [place], [place]   1 year ago Type 2 diabetes mellitus with diabetic polyneuropathy, without long-term current use of insulin [facility])   [facility] [place], Wendee Beavers, [place]      Future Appointments            In 1 month Terrilee Croak, Wendee [place], PA-C [employer]

## 2019-12-19 NOTE — Progress Notes (Signed)
I,Laura E Walsh,acting as a Education administrator for [employer], PA-C.,have documented all relevant documentation on the behalf of [name], PA-C,as directed by  [name], PA-C while in the presence of [name], PA-C.   Established patient visit   Patient: [name] [name]   DOB: 1976/02/21   44 y.o. Male  MRN: [id number] Visit Date: 12/20/2019  Today's healthcare provider: [name], PA-C   Chief Complaint  Patient presents with  . Diabetes   Subjective    HPI  Diabetes Mellitus Type II, Follow-up  Lab Results  Component Value Date   HGBA1C 12.0 (A) 09/20/2019   HGBA1C 9.5 (A) 06/22/2019   HGBA1C 7.3 (A) 03/22/2019   Wt Readings from Last 3 Encounters:  12/20/19 264 lb (119.7 kg)  09/20/19 271 lb 9.6 oz (123.2 kg)  09/14/19 273 lb (123.8 kg)   Last seen for diabetes 3 months ago.  Management since then includes patient was started on Trulicity 2.95 mg in the office but medication was denied by insurance and it was switched to Victoza 0.6 mg once for 7 days, then 1.2 mg once daily. was sent into the pharmacy. He reports excellent compliance with treatment. He is having side effects.  Pt reports Victoza is causing diarrhea and episodes of vomiting. He however tolerate trulicity well.  Symptoms: No fatigue No foot ulcerations  No appetite changes No nausea  No paresthesia of the feet  No polydipsia  No polyuria No visual disturbances   No vomiting     Home blood sugar records: fasting range: 130's  Episodes of hypoglycemia? No     Most Recent Eye Exam: UTD Current diet habits: in general, a "healthy" diet    Pertinent Labs: Lab Results  Component Value Date   CHOL 159 12/20/2018   HDL 40 12/20/2018   LDLCALC 69 12/20/2018   LDLDIRECT 129 (H) 06/24/2018   TRIG 252 (H) 12/20/2018   CHOLHDL 4.0 12/20/2018   Lab Results  Component Value Date   NA 141 06/24/2018   K 4.3 06/24/2018   CREATININE 1.09 06/24/2018   GFRNONAA 84 06/24/2018    GFRAA 98 06/24/2018   GLUCOSE 121 (H) 06/24/2018     ---------------------------------------------------------------------------------------------------   Patient Active Problem List   Diagnosis Date Noted  . Poorly controlled diabetes mellitus ([place]) 09/14/2019  . Obstructive sleep apnea 09/14/2019  . Class 2 severe obesity with serious comorbidity and body mass index (BMI) of 37.0 to 37.9 in adult ([place]) 03/22/2019  . Diabetic polyneuropathy associated with type 2 diabetes mellitus ([place]) 03/22/2019  . Coronary artery disease involving native coronary artery of native heart without angina pectoris 09/19/2018  . Hyperlipidemia LDL goal <70 09/19/2018  . Hematochezia 01/19/2018  . Back pain 10/23/2011  . Heart palpitations 03/02/2011  . CAD (coronary artery disease) 10/29/2010  . Diabetes mellitus without complication ([place]) 18/84/1660  . Essential hypertension   . Heartburn   . Atypical chest pain   . Hyperlipidemia associated with type 2 diabetes mellitus ([place])    Past Medical History:  Diagnosis Date  . ADHD (attention deficit hyperactivity disorder)   . Allergy   . Arthritis   . CAD (coronary artery disease)    S/P cath May 2012 with BMS to the LAD following abnormal stress test  . Diabetes mellitus   . Drug abuse ([place])    history of marijuana use and alcohol  . GERD (gastroesophageal reflux disease)   . Hypercholesterolemia   . Hypertension   .  Myocardial infarction ([place])   . Obesity   . Oxygen deficiency   . Sleep apnea    Social History   Tobacco Use  . Smoking status: Former Smoker    Packs/day: 1.00    Years: 14.00    Pack years: 14.00    Types: Cigarettes    Quit date: 06/2017    Years since quitting: 2.5  . Smokeless tobacco: Never Used  Vaping Use  . Vaping Use: Never used  Substance Use Topics  . Alcohol use: Yes    Alcohol/week: 10.0 standard drinks    Types: 2 Cans of beer, 8 Shots of liquor per week  . Drug use: Yes    Frequency: 20.0 times  per week    Types: Marijuana   Allergies  Allergen Reactions  . Penicillins      Medications: Outpatient Medications Prior to Visit  Medication Sig  . amLODipine (NORVASC) 10 MG tablet TAKE 1 TABLET(10 MG) BY MOUTH DAILY  . aspirin 81 MG tablet Take 81 mg by mouth daily.   . cyclobenzaprine (FLEXERIL) 5 MG tablet Take 1 tablet (5 mg total) by mouth 2 (two) times daily as needed for muscle spasms.  . Dulaglutide (TRULICITY) 9.56 LO/7.5IE SOPN Inject 0.75 mLs into the skin once a week.  . ezetimibe (ZETIA) 10 MG tablet TAKE 1 TABLET(10 MG) BY MOUTH DAILY  . Insulin Pen Needle (NOVOFINE) 32G X 6 MM MISC Use once daily to inject victoza.  [name] liraglutide (VICTOZA) 18 MG/3ML SOPN Start 0.6mg  SQ once a day for 7 days, then increase to 1.2mg  once a day  . metFORMIN (GLUCOPHAGE) 1000 MG tablet Take 1 tablet (1,000 mg total) by mouth 2 (two) times daily with a meal. (Patient taking differently: Take 1,000 mg by mouth daily. )  . metoprolol succinate (TOPROL-XL) 25 MG 24 hr tablet TAKE 1 TABLET(25 MG) BY MOUTH AT BEDTIME  . pantoprazole (PROTONIX) 40 MG tablet TAKE 1 TABLET(40 MG) BY MOUTH DAILY  . rosuvastatin (CRESTOR) 40 MG tablet TAKE 1 TABLET(40 MG) BY MOUTH DAILY  . nitroGLYCERIN (NITROSTAT) 0.4 MG SL tablet Place 1 tablet (0.4 mg total) under the tongue every 5 (five) minutes as needed for chest pain. Maximum of 3 doses.  [name] QUEtiapine (SEROQUEL XR) 50 MG TB24 24 hr tablet Take 1 tablet (50 mg total) by mouth at bedtime.   No facility-administered medications prior to visit.    Review of Systems  Constitutional: Positive for appetite change. Negative for activity change, chills, diaphoresis, fatigue, fever and unexpected weight change.  Respiratory: Negative.   Cardiovascular: Negative.   Gastrointestinal: Positive for diarrhea (Has been going on for a month since increasing Victoza ). Negative for abdominal distention, abdominal pain, anal bleeding, blood in stool, constipation, nausea,  rectal pain and vomiting.  Endocrine: Negative.   Musculoskeletal: Negative.   Neurological: Negative for dizziness, light-headedness and headaches.    Last CBC Lab Results  Component Value Date   WBC 7.5 05/11/2019   HGB 14.8 05/11/2019   HCT 42.5 05/11/2019   MCV 88 05/11/2019   MCH 30.8 05/11/2019   RDW 13.7 05/11/2019   PLT 260 33/29/5188   Last metabolic panel Lab Results  Component Value Date   GLUCOSE 121 (H) 06/24/2018   NA 141 06/24/2018   K 4.3 06/24/2018   CL 103 06/24/2018   CO2 19 (L) 06/24/2018   BUN 10 06/24/2018   CREATININE 1.09 06/24/2018   GFRNONAA 84 06/24/2018   GFRAA 98 06/24/2018   CALCIUM  10.4 (H) 06/24/2018   PROT 7.7 06/24/2018   ALBUMIN 4.9 06/24/2018   LABGLOB 2.8 06/24/2018   AGRATIO 1.8 06/24/2018   BILITOT 0.4 06/24/2018   ALKPHOS 82 06/24/2018   AST 17 06/24/2018   ALT 31 06/24/2018   ANIONGAP 9 12/06/2017   Last lipids Lab Results  Component Value Date   CHOL 159 12/20/2018   HDL 40 12/20/2018   LDLCALC 69 12/20/2018   LDLDIRECT 129 (H) 06/24/2018   TRIG 252 (H) 12/20/2018   CHOLHDL 4.0 12/20/2018   Last hemoglobin A1c Lab Results  Component Value Date   HGBA1C 12.0 (A) 09/20/2019   Last thyroid functions Lab Results  Component Value Date   TSH 0.566 04/11/2018    Objective    BP 129/90 (BP Location: Right Arm, Patient Position: Sitting, Cuff Size: Large)   Pulse 87   Temp (!) 96.1 F (35.6 C) (Temporal)   Wt 264 lb (119.7 kg)   BMI 33.00 kg/m  BP Readings from Last 3 Encounters:  12/20/19 129/90  09/20/19 140/88  09/14/19 130/88      Physical Exam Constitutional:      Appearance: Normal appearance.  Cardiovascular:     Rate and Rhythm: Normal rate and regular rhythm.     Heart sounds: Normal heart sounds.  Pulmonary:     Effort: Pulmonary effort is normal.     Breath sounds: Normal breath sounds.  Skin:    General: Skin is warm and dry.  Neurological:     Mental Status: He is alert and oriented  to person, place, and time. Mental status is at baseline.  Psychiatric:        Mood and Affect: Mood normal.        Behavior: Behavior normal.       No results found for any visits on 12/20/19.  Assessment & Plan    1. Essential hypertension  Well controlled Continue current medications Recheck metabolic panel F/u in 3 months   2. Diabetes mellitus without complication ([place])  Better controlled, but not controlled. Will try to change back to trulicity 1.79 mg subQ since he tolerated that better and has had good sugar reduction with GLP1.  - Comprehensive metabolic panel  3. Hyperlipidemia associated with type 2 diabetes mellitus ([place])  Previously well controlled Continue statin Repeat FLP and CMP Goal LDL < 70  - Comprehensive metabolic panel - Lipid panel  4. Hyperlipidemia LDL goal <70   5. Poorly controlled diabetes mellitus ([place])     Return in about 3 months (around 03/21/2020) for Diabetes, hypertension, Hyperlipidemia.      ITrinna Post, PA-C, have reviewed all documentation for this visit. The documentation on 12/22/19 for the exam, diagnosis, procedures, and orders are all accurate and complete.    Paulene Floor  [facility] [phone] (phone) [phone] (fax)  [place]

## 2019-12-20 ENCOUNTER — Other Ambulatory Visit: Payer: Self-pay

## 2019-12-20 ENCOUNTER — Encounter: Payer: Self-pay | Admitting: Physician Assistant

## 2019-12-20 ENCOUNTER — Emergency Department
Admission: EM | Admit: 2019-12-20 | Discharge: 2019-12-20 | Disposition: A | Discharge status: Home / Self Care | Attending: Emergency Medicine | Admitting: Emergency Medicine

## 2019-12-20 ENCOUNTER — Ambulatory Visit (INDEPENDENT_AMBULATORY_CARE_PROVIDER_SITE_OTHER): Admitting: Physician Assistant

## 2019-12-20 VITALS — BP 129/90 | HR 87 | Temp 96.1°F | Wt 264.0 lb

## 2019-12-20 DIAGNOSIS — E785 Hyperlipidemia, unspecified: Secondary | ICD-10-CM

## 2019-12-20 DIAGNOSIS — I10 Essential (primary) hypertension: Secondary | ICD-10-CM

## 2019-12-20 DIAGNOSIS — E1169 Type 2 diabetes mellitus with other specified complication: Secondary | ICD-10-CM

## 2019-12-20 DIAGNOSIS — R112 Nausea with vomiting, unspecified: Secondary | ICD-10-CM | POA: Insufficient documentation

## 2019-12-20 DIAGNOSIS — E1165 Type 2 diabetes mellitus with hyperglycemia: Secondary | ICD-10-CM

## 2019-12-20 DIAGNOSIS — R1084 Generalized abdominal pain: Secondary | ICD-10-CM | POA: Diagnosis present

## 2019-12-20 DIAGNOSIS — Z5321 Procedure and treatment not carried out due to patient leaving prior to being seen by health care provider: Secondary | ICD-10-CM | POA: Diagnosis not present

## 2019-12-20 DIAGNOSIS — E119 Type 2 diabetes mellitus without complications: Secondary | ICD-10-CM

## 2019-12-20 LAB — URINALYSIS, COMPLETE (UACMP) WITH MICROSCOPIC
Bilirubin Urine: NEGATIVE
Glucose, UA: NEGATIVE mg/dL
Hgb urine dipstick: NEGATIVE
Ketones, ur: NEGATIVE mg/dL
Leukocytes,Ua: NEGATIVE
Nitrite: NEGATIVE
Protein, ur: NEGATIVE mg/dL
Specific Gravity, Urine: 1.024 (ref 1.005–1.030)
Squamous Epithelial / HPF: NONE SEEN (ref 0–5)
pH: 5 (ref 5.0–8.0)

## 2019-12-20 LAB — CBC
HCT: 44.2 % (ref 39.0–52.0)
Hemoglobin: 14.9 g/dL (ref 13.0–17.0)
MCH: 30.2 pg (ref 26.0–34.0)
MCHC: 33.7 g/dL (ref 30.0–36.0)
MCV: 89.7 fL (ref 80.0–100.0)
Platelets: 263 10*3/uL (ref 150–400)
RBC: 4.93 MIL/uL (ref 4.22–5.81)
RDW: 14 % (ref 11.5–15.5)
WBC: 6.6 10*3/uL (ref 4.0–10.5)
nRBC: 0 % (ref 0.0–0.2)

## 2019-12-20 LAB — COMPREHENSIVE METABOLIC PANEL
ALT: 46 U/L — ABNORMAL HIGH (ref 0–44)
AST: 31 U/L (ref 15–41)
Albumin: 4.8 g/dL (ref 3.5–5.0)
Alkaline Phosphatase: 57 U/L (ref 38–126)
Anion gap: 10 (ref 5–15)
BUN: 9 mg/dL (ref 6–20)
CO2: 22 mmol/L (ref 22–32)
Calcium: 9.6 mg/dL (ref 8.9–10.3)
Chloride: 107 mmol/L (ref 98–111)
Creatinine, Ser: 0.67 mg/dL (ref 0.61–1.24)
GFR calc Af Amer: >60 mL/min (ref >60)
GFR calc non Af Amer: >60 mL/min (ref >60)
Glucose, Bld: 111 mg/dL — ABNORMAL HIGH (ref 70–99)
Potassium: 3.7 mmol/L (ref 3.5–5.1)
Sodium: 139 mmol/L (ref 135–145)
Total Bilirubin: 0.5 mg/dL (ref 0.3–1.2)
Total Protein: 8.2 g/dL — ABNORMAL HIGH (ref 6.5–8.1)

## 2019-12-20 LAB — LIPASE, BLOOD: Lipase: 45 U/L (ref 11–51)

## 2019-12-20 NOTE — Patient Instructions (Signed)

## 2019-12-20 NOTE — Assessment & Plan Note (Signed)
A1C improved at 8.5 from 12.0 but still not to goal at below 7%  UTD on vaccines, eye exam, foot exam On ACEi On Statin Discussed diet and exercise F/u in 3 months

## 2019-12-20 NOTE — Assessment & Plan Note (Signed)
Well controlled Continue current medications Recheck metabolic panel F/u in 3 months  

## 2019-12-20 NOTE — ED Triage Notes (Addendum)
Pt arrives to ED via POV from home with c/o generalized abdominal pain with N/V/D. Pt reports 1 episode of emesis and 0 episodes of diarrhea over the last 24 hrs. Pt states recent h/x of same with s/x's that went away, but s/x's returned after his PCP "upped my insulin dose". Pt reports compliance with r/x'd medications. Pt denies CP or SHOB. Pt is A&O, in NAD; RR even, regular, and unlabored.

## 2019-12-22 MED ORDER — TRULICITY 0.75 MG/0.5ML ~~LOC~~ SOAJ: 0.7500 mg | SUBCUTANEOUS | 2 refills | Status: DC

## 2020-01-02 ENCOUNTER — Ambulatory Visit: Payer: Self-pay | Admitting: Physician Assistant

## 2020-01-02 NOTE — Telephone Encounter (Signed)
  Pt called in c/o having blood in his stool this morning.   "The blood is dripping into the toilet after I went".  He is c/o abd pain but no vomiting.  He mentioned having abd pain on and off for about a month.  I did not get a clear understanding whether this included bloody stool or not.  He mentioned he went to the ED but left "because I was hurting so bad and they left me sitting out there a long time".  This was on 12/20/2019.    See triage notes.  I have referred him back to the ED.   He may come to [facility] instead of to [facility] because of waiting so long last time.   I let him know that would be fine.   I told him not to take his aspirin this morning.  I sent my notes to [name], PA-C   Reason for Disposition . [1] MODERATE rectal bleeding (small blood clots, passing blood without stool, or toilet water turns red) AND [2] more than once a day  Answer Assessment - Initial Assessment Questions 1. APPEARANCE of BLOOD: "What color is it?" "Is it passed separately, on the surface of the stool, or mixed in with the stool?"      I had BM this morning and it was bloody.  The blood is dripping into the toilet.      I've been having this on and off.   I went to hospital but I left because I waited so long.   This was 2 weeks ago when I was told to go to the ER. 2. AMOUNT: "How much blood was passed?"      It's dripping into the toilet and it's more than it has been. 3. FREQUENCY: "How many times has blood been passed with the stools?"      On and off but this morning more than ever. 4. ONSET: "When was the blood first seen in the stools?" (Days or weeks)      I've been having this on and off. 5. DIARRHEA: "Is there also some diarrhea?" If Yes, ask: "How many diarrhea stools were passed in past 24 hours?"      Not asked 6. CONSTIPATION: "Do you have constipation?" If Yes, ask: "How bad is it?"     Not asked  See below 7. RECURRENT SYMPTOMS: "Have you had  blood in your stools before?" If Yes, ask: "When was the last time?" and "What happened that time?"      Yes on and off 8. BLOOD THINNERS: "Do you take any blood thinners?" (e.g., Coumadin/warfarin, Pradaxa/dabigatran, aspirin)     No except an aspirin a day. 9. OTHER SYMPTOMS: "Do you have any other symptoms?"  (e.g., abdominal pain, vomiting, dizziness, fever)     abd pain.   No vomiting. 10. PREGNANCY: "Is there any chance you are pregnant?" "When was your last menstrual period?"       N/A  Protocols used: RECTAL BLEEDING-A-AH

## 2020-01-02 NOTE — Telephone Encounter (Signed)
FYI

## 2020-03-14 ENCOUNTER — Other Ambulatory Visit: Payer: Self-pay | Admitting: Physician Assistant

## 2020-03-14 DIAGNOSIS — K219 Gastro-esophageal reflux disease without esophagitis: Secondary | ICD-10-CM

## 2020-03-19 ENCOUNTER — Other Ambulatory Visit: Payer: Self-pay | Admitting: Physician Assistant

## 2020-03-19 DIAGNOSIS — I10 Essential (primary) hypertension: Secondary | ICD-10-CM

## 2020-03-22 ENCOUNTER — Ambulatory Visit (INDEPENDENT_AMBULATORY_CARE_PROVIDER_SITE_OTHER): Admitting: Physician Assistant

## 2020-03-22 ENCOUNTER — Other Ambulatory Visit: Payer: Self-pay

## 2020-03-22 VITALS — BP 130/92 | HR 79 | Ht 75.0 in | Wt 269.0 lb

## 2020-03-22 DIAGNOSIS — I10 Essential (primary) hypertension: Secondary | ICD-10-CM

## 2020-03-22 DIAGNOSIS — E119 Type 2 diabetes mellitus without complications: Secondary | ICD-10-CM | POA: Diagnosis not present

## 2020-03-22 DIAGNOSIS — Z6837 Body mass index (BMI) 37.0-37.9, adult: Secondary | ICD-10-CM | POA: Diagnosis not present

## 2020-03-22 DIAGNOSIS — E785 Hyperlipidemia, unspecified: Secondary | ICD-10-CM

## 2020-03-22 LAB — POCT GLYCOSYLATED HEMOGLOBIN (HGB A1C): Hemoglobin A1C: 7.3 % — AB (ref 4.0–5.6)

## 2020-03-22 NOTE — Progress Notes (Signed)
Established patient visit   Patient: [name] [name]   DOB: 08-20-1975   44 y.o. Male  MRN: [id number] Visit Date: 03/22/2020  Today's healthcare provider: [name], PA-C   Chief Complaint  Patient presents with  . Diabetes  . Hyperlipidemia  . Hypertension   Subjective    HPI   Hypertension, follow-up  BP Readings from Last 3 Encounters:  03/22/20 (!) 130/92  12/20/19 (!) 120/93  12/20/19 129/90   Wt Readings from Last 3 Encounters:  03/22/20 269 lb (122 kg)  12/20/19 265 lb (120.2 kg)  12/20/19 264 lb (119.7 kg)     He was last seen for hypertension 3 months ago.  BP at that visit was slightly elevated. Management since that visit includes continuing amlodipine 10 mg QD and metoprolol succinate 25 mg daily.  He reports fair compliance with treatment. Patient reports he has had change in insurance which caused delay in filling medications. He is not having side effects.  He is following a Regular diet. Reducing potatoes, soda, pasta. He is not exercising. He does not smoke.  Use of agents associated with hypertension: none.   Outside blood pressures are not checked. Symptoms: No chest pain No chest pressure  No palpitations No syncope  No dyspnea No orthopnea  No paroxysmal nocturnal dyspnea No lower extremity edema   Pertinent labs: Lab Results  Component Value Date   CHOL 159 12/20/2018   HDL 40 12/20/2018   LDLCALC 69 12/20/2018   LDLDIRECT 129 (H) 06/24/2018   TRIG 252 (H) 12/20/2018   CHOLHDL 4.0 12/20/2018   Lab Results  Component Value Date   NA 139 12/20/2019   K 3.7 12/20/2019   CREATININE 0.67 12/20/2019   GFRNONAA >60 12/20/2019   GFRAA >60 12/20/2019   GLUCOSE 111 (H) 12/20/2019     The 10-year ASCVD risk score Mikey Bussing DC Jr., et al., 2013) is: 11%   --------------------------------------------------------------------------------------------------- Lipid/Cholesterol, Follow-up  Last lipid panel Other pertinent labs  Lab  Results  Component Value Date   CHOL 159 12/20/2018   HDL 40 12/20/2018   LDLCALC 69 12/20/2018   LDLDIRECT 129 (H) 06/24/2018   TRIG 252 (H) 12/20/2018   CHOLHDL 4.0 12/20/2018   Lab Results  Component Value Date   ALT 46 (H) 12/20/2019   AST 31 12/20/2019   PLT 263 12/20/2019   TSH 0.566 04/11/2018     He was last seen for this 3 months ago.  Management since that visit includes continue statin.  He reports excellent compliance with treatment. He is not having side effects.   Symptoms: No chest pain No chest pressure/discomfort  No dyspnea No lower extremity edema  No numbness or tingling of extremity No orthopnea  No palpitations No paroxysmal nocturnal dyspnea  No speech difficulty No syncope   Current diet: well balanced Current exercise: none  The 10-year ASCVD risk score Mikey Bussing DC Jr., et al., 2013) is: 11%  --------------------------------------------------------------------------------------------------- Diabetes Mellitus Type II, Follow-up  Lab Results  Component Value Date   HGBA1C 7.3 (A) 03/22/2020   HGBA1C 12.0 (A) 09/20/2019   HGBA1C 9.5 (A) 06/22/2019   Wt Readings from Last 3 Encounters:  03/22/20 269 lb (122 kg)  12/20/19 265 lb (120.2 kg)  12/20/19 264 lb (119.7 kg)   Last seen for diabetes 3 months ago.  Management since then includes switching victoza to trulicity. He reports good compliance with treatment. He is not having side effects.  Symptoms: No fatigue No foot ulcerations  No appetite changes No nausea  No paresthesia of the feet  No polydipsia  No polyuria No visual disturbances   No vomiting     Home blood sugar records: fasting range: not checked  Episodes of hypoglycemia? No    Current insulin regiment: none Most Recent Eye Exam: 06/2019 Current exercise: none Current diet habits: not asked  Pertinent Labs: Lab Results  Component Value Date   CHOL 159 12/20/2018   HDL 40 12/20/2018   LDLCALC 69 12/20/2018    LDLDIRECT 129 (H) 06/24/2018   TRIG 252 (H) 12/20/2018   CHOLHDL 4.0 12/20/2018   Lab Results  Component Value Date   NA 139 12/20/2019   K 3.7 12/20/2019   CREATININE 0.67 12/20/2019   GFRNONAA >60 12/20/2019   GFRAA >60 12/20/2019   GLUCOSE 111 (H) 12/20/2019     ---------------------------------------------------------------------------------------------------   Wt Readings from Last 3 Encounters:  03/22/20 269 lb (122 kg)  12/20/19 265 lb (120.2 kg)  12/20/19 264 lb (119.7 kg)        Medications: Outpatient Medications Prior to Visit  Medication Sig  . amLODipine (NORVASC) 10 MG tablet TAKE 1 TABLET(10 MG) BY MOUTH DAILY  . aspirin 81 MG tablet Take 81 mg by mouth daily.   . cyclobenzaprine (FLEXERIL) 5 MG tablet Take 1 tablet (5 mg total) by mouth 2 (two) times daily as needed for muscle spasms.  . Dulaglutide (TRULICITY) 8.50 YD/7.4JO SOPN Inject 0.5 mLs (0.75 mg total) into the skin once a week.  . ezetimibe (ZETIA) 10 MG tablet TAKE 1 TABLET(10 MG) BY MOUTH DAILY  . Insulin Pen Needle (NOVOFINE) 32G X 6 MM MISC Use once daily to inject victoza.  . metoprolol succinate (TOPROL-XL) 25 MG 24 hr tablet TAKE 1 TABLET(25 MG) BY MOUTH AT BEDTIME  . pantoprazole (PROTONIX) 40 MG tablet TAKE 1 TABLET(40 MG) BY MOUTH DAILY  . rosuvastatin (CRESTOR) 40 MG tablet TAKE 1 TABLET(40 MG) BY MOUTH DAILY  . liraglutide (VICTOZA) 18 MG/3ML SOPN Start 0.6mg  SQ once a day for 7 days, then increase to 1.2mg  once a day (Patient not taking: Reported on 03/22/2020)  . metFORMIN (GLUCOPHAGE) 1000 MG tablet Take 1 tablet (1,000 mg total) by mouth 2 (two) times daily with a meal. (Patient not taking: Reported on 03/22/2020)  . nitroGLYCERIN (NITROSTAT) 0.4 MG SL tablet Place 1 tablet (0.4 mg total) under the tongue every 5 (five) minutes as needed for chest pain. Maximum of 3 doses.  [name] QUEtiapine (SEROQUEL XR) 50 MG TB24 24 hr tablet Take 1 tablet (50 mg total) by mouth at bedtime.   No  facility-administered medications prior to visit.    Review of Systems    Objective    BP (!) 130/92   Pulse 79   Ht 6\' 3"  (1.905 m)   Wt 269 lb (122 kg)   SpO2 100%   BMI 33.62 kg/m    Physical Exam Constitutional:      Appearance: Normal appearance.  Cardiovascular:     Rate and Rhythm: Normal rate and regular rhythm.     Heart sounds: Normal heart sounds.  Pulmonary:     Effort: Pulmonary effort is normal.     Breath sounds: Normal breath sounds.  Skin:    General: Skin is warm and dry.  Neurological:     Mental Status: He is alert and oriented to person, place, and time. Mental status is at baseline.  Psychiatric:        Mood and Affect: Mood  normal.        Behavior: Behavior normal.       Results for orders placed or performed in visit on 03/22/20  POCT glycosylated hemoglobin (Hb A1C)  Result Value Ref Range   Hemoglobin A1C 7.3 (A) 4.0 - 5.6 %   HbA1c POC (<> result, manual entry)     HbA1c, POC (prediabetic range)     HbA1c, POC (controlled diabetic range)      Assessment & Plan    1. Class 2 severe obesity with serious comorbidity and body mass index (BMI) of 37.0 to 37.9 in adult, unspecified obesity type ([place])   2. Hyperlipidemia LDL goal <70  Previously well controlled Continue statin Repeat FLP and CMP Goal LDL < 70   3. Essential hypertension  Well controlled Continue current medications Recheck metabolic panel F/u in 3 months   4. Diabetes mellitus without complication ([place])  Well controlled with last A1c 7.2% Continue current medications UTD on vaccines, eye exam, foot exam On ACEi On Statin Discussed diet and exercise F/u in 3 months  - Urine Microalbumin w/creat. ratio - POCT glycosylated hemoglobin (Hb A1C)   Return in about 3 months (around 06/22/2020) for chronic .      ITrinna Post, PA-C, have reviewed all documentation for this visit. The documentation on 03/22/20 for the exam, diagnosis, procedures, and  orders are all accurate and complete.  The entirety of the information documented in the History of Present Illness, Review of Systems and Physical Exam were personally obtained by me. Portions of this information were initially documented by Velva Harman, CMA and reviewed by me for thoroughness and accuracy.        Paulene Floor  [facility] [phone] (phone) [phone] (fax)  [place]

## 2020-03-23 LAB — MICROALBUMIN / CREATININE URINE RATIO
Creatinine, Urine: 191.5 mg/dL
Microalb/Creat Ratio: 10 mg/g creat (ref 0–29)
Microalbumin, Urine: 18.4 ug/mL

## 2020-04-03 ENCOUNTER — Other Ambulatory Visit: Payer: Self-pay | Admitting: Physician Assistant

## 2020-04-03 DIAGNOSIS — E119 Type 2 diabetes mellitus without complications: Secondary | ICD-10-CM

## 2020-04-03 DIAGNOSIS — E1165 Type 2 diabetes mellitus with hyperglycemia: Secondary | ICD-10-CM

## 2020-04-03 MED ORDER — TRULICITY 0.75 MG/0.5ML ~~LOC~~ SOAJ: 0.7500 mg | SUBCUTANEOUS | 1 refills | Status: DC

## 2020-04-03 NOTE — Telephone Encounter (Signed)
Pt seen [name] [name] on 03/22/2020 and he is waiting on the refill testing strips for his accu chek machine and trulicity . [facility] on [address] in [place]. Pt check his BS at least twice a day

## 2020-04-03 NOTE — Telephone Encounter (Signed)
Per initial encounter, "Pt seen adriana on 03/22/2020 and he is waiting on the refill testing strips for his accu chek machine and trulicity . [facility] on [address] in [place]. Pt check his BS at least twice a day"; testing strips not on med profile; will route to office for final disposition.  Requested medication (s) are due for refill today: Yes  Requested medication (s) are on the active medication list: no  Last refill: ?  Future visit scheduled:yes  Notes to clinic:not active med list

## 2020-04-03 NOTE — Telephone Encounter (Signed)
Requested Prescriptions  Pending Prescriptions Disp Refills  . Dulaglutide (TRULICITY) 1.61 WR/6.0AV SOPN 4 mL 1    Sig: Inject 0.75 mg into the skin once a week.     Endocrinology:  Diabetes - GLP-1 Receptor Agonists Passed - 04/03/2020  3:12 PM      Passed - HBA1C is between 0 and 7.9 and within 180 days    Hemoglobin A1C  Date Value Ref Range Status  03/22/2020 7.3 (A) 4.0 - 5.6 % Final   Hgb A1c MFr Bld  Date Value Ref Range Status  04/11/2018 7.9 (H) 4.8 - 5.6 % Final    Comment:             Prediabetes: 5.7 - 6.4          Diabetes: >6.4          Glycemic control for adults with diabetes: <7.0          Passed - Valid encounter within last 6 months    Recent Outpatient Visits          1 week ago Class 2 severe obesity with serious comorbidity and body mass index (BMI) of 37.0 to 37.9 in adult, unspecified obesity type [facility])   [place], [place], PA-C   3 months ago Essential hypertension   [place], [place], PA-C   6 months ago Type 2 diabetes mellitus with hyperglycemia, without long-term current use of insulin [facility])   [facility] [place], Fabio Bering M, PA-C   9 months ago Type 2 diabetes mellitus with hyperglycemia, without long-term current use of insulin [facility])   [facility] [place], [place], [place]   1 year ago Type 2 diabetes mellitus with hyperglycemia, without long-term current use of insulin [facility])   [facility] [name], [place]      Future Appointments            In 2 months [name], PA-C [employer]

## 2020-04-05 NOTE — Telephone Encounter (Signed)
[name] was on 03/22/2020 and next appointment is on 06/25/2020.

## 2020-04-10 ENCOUNTER — Telehealth: Payer: Self-pay

## 2020-04-10 DIAGNOSIS — I10 Essential (primary) hypertension: Secondary | ICD-10-CM

## 2020-04-10 DIAGNOSIS — E1165 Type 2 diabetes mellitus with hyperglycemia: Secondary | ICD-10-CM

## 2020-04-10 NOTE — Telephone Encounter (Signed)
Returned wife's call and her & patient was advised waiting on approval from [employer] for the [place].

## 2020-04-10 NOTE — Telephone Encounter (Signed)
Copied from [place] [id number]. Topic: General - Inquiry >> Apr 09, 2020  5:35 PM Gillis Ends D wrote: Reason for CRM: Patients wife called and stated that the pharmacy requested a prior authorization on 95/58/31 for his trulicity insulin and he also needs a Accu Chek smartview blood glucose machine with testing strips and lancets and they haven't heard anything. She wanted to make sure the prior authorization had been sent to the insurance company. She states that he has been without his medication for 2 weeks now. She can be reached at [phone]. Please advise

## 2020-04-15 ENCOUNTER — Other Ambulatory Visit: Payer: Self-pay | Admitting: Physician Assistant

## 2020-04-15 DIAGNOSIS — E1142 Type 2 diabetes mellitus with diabetic polyneuropathy: Secondary | ICD-10-CM

## 2020-04-15 DIAGNOSIS — E785 Hyperlipidemia, unspecified: Secondary | ICD-10-CM

## 2020-04-15 DIAGNOSIS — E1169 Type 2 diabetes mellitus with other specified complication: Secondary | ICD-10-CM

## 2020-04-15 NOTE — Telephone Encounter (Signed)
Requested medication (s) are due for refill today -yes  Requested medication (s) are on the active medication list -yes  Future visit scheduled -yes  Last refill: 01/04/20  Notes to clinic: fails lab protocol- labs due- sent for review   Requested Prescriptions  Pending Prescriptions Disp Refills   rosuvastatin (CRESTOR) 40 MG tablet [Pharmacy Med Name: ROSUVASTATIN 40MG  TABLETS] 90 tablet 0    Sig: TAKE 1 TABLET(40 MG) BY MOUTH DAILY      Cardiovascular:  Antilipid - Statins Failed - 04/15/2020  4:11 PM      Failed - Total Cholesterol in normal range and within 360 days    Cholesterol, Total  Date Value Ref Range Status  12/20/2018 159 100 - 199 mg/dL Final          Failed - LDL in normal range and within 360 days    LDL Calculated  Date Value Ref Range Status  12/20/2018 69 0 - 99 mg/dL Final   LDL Direct  Date Value Ref Range Status  06/24/2018 129 (H) 0 - 99 mg/dL Final          Failed - HDL in normal range and within 360 days    HDL  Date Value Ref Range Status  12/20/2018 40 >39 mg/dL Final          Failed - Triglycerides in normal range and within 360 days    Triglycerides  Date Value Ref Range Status  12/20/2018 252 (H) 0 - 149 mg/dL Final          Passed - Patient is not pregnant      Passed - Valid encounter within last 12 months    Recent Outpatient Visits           3 weeks ago Class 2 severe obesity with serious comorbidity and body mass index (BMI) of 37.0 to 37.9 in adult, unspecified obesity type [facility])   [place], Adriana M, PA-C   3 months ago Essential hypertension   [employer], Adriana M, PA-C   6 months ago Type 2 diabetes mellitus with hyperglycemia, without long-term current use of insulin [facility])   [facility] [place], Adriana M, PA-C   9 months ago Type 2 diabetes mellitus with hyperglycemia, without long-term current use of insulin [facility] [place])   [facility] [place],  [place], PA-C   1 year ago Type 2 diabetes mellitus with hyperglycemia, without long-term current use of insulin [facility])   [facility], [place], [place]       Future Appointments             In 2 months [place], Adriana M, PA-C [employer]                Requested Prescriptions  Pending Prescriptions Disp Refills   rosuvastatin (CRESTOR) 40 MG tablet [Pharmacy Med Name: ROSUVASTATIN 40MG  TABLETS] 90 tablet 0    Sig: TAKE 1 TABLET(40 MG) BY MOUTH DAILY      Cardiovascular:  Antilipid - Statins Failed - 04/15/2020  4:11 PM      Failed - Total Cholesterol in normal range and within 360 days    Cholesterol, Total  Date Value Ref Range Status  12/20/2018 159 100 - 199 mg/dL Final          Failed - LDL in normal range and within 360 days    LDL Calculated  Date Value Ref Range Status  12/20/2018 69 0 -  99 mg/dL Final   LDL Direct  Date Value Ref Range Status  06/24/2018 129 (H) 0 - 99 mg/dL Final          Failed - HDL in normal range and within 360 days    HDL  Date Value Ref Range Status  12/20/2018 40 >39 mg/dL Final          Failed - Triglycerides in normal range and within 360 days    Triglycerides  Date Value Ref Range Status  12/20/2018 252 (H) 0 - 149 mg/dL Final          Passed - Patient is not pregnant      Passed - Valid encounter within last 12 months    Recent Outpatient Visits           3 weeks ago Class 2 severe obesity with serious comorbidity and body mass index (BMI) of 37.0 to 37.9 in adult, unspecified obesity type [facility])   [place], [place], PA-C   3 months ago Essential hypertension   [place], [place], PA-C   6 months ago Type 2 diabetes mellitus with hyperglycemia, without long-term current use of insulin [facility])   [facility] [place], Adriana M, PA-C   9 months ago Type 2 diabetes mellitus with hyperglycemia, without long-term  current use of insulin [facility])   [facility] [place], [place], [place]   1 year ago Type 2 diabetes mellitus with hyperglycemia, without long-term current use of insulin [facility])   [facility] [name], [place]       Future Appointments             In 2 months [name], PA-C [employer]

## 2020-04-18 NOTE — Telephone Encounter (Signed)
Pts wife called and is requesting to have an update on this PA. Please advise.

## 2020-04-18 NOTE — Telephone Encounter (Signed)
Returned patient's wife call and she was advised that PA was denied due to patient insurance changing and Butch Penny w/[employer] [place] tracks states they no longer handles PA's for the patient. Patient wife was advised that [employer] tracks states they will have to contact member services on the back of the new insurance ([place] [employer]) card to make them aware that they are his primary insurance company. Since patient is out for medication for a month now and Fabio Bering is out the office I another provider in the office if patient could have samples for the Trulicity and she stated yes and pt's wife was advised that samples will be at front desk for patient to pick up.

## 2020-04-24 ENCOUNTER — Other Ambulatory Visit: Payer: Self-pay | Admitting: Physician Assistant

## 2020-04-24 DIAGNOSIS — I10 Essential (primary) hypertension: Secondary | ICD-10-CM

## 2020-04-25 ENCOUNTER — Other Ambulatory Visit: Payer: Self-pay | Admitting: Physician Assistant

## 2020-04-25 DIAGNOSIS — I10 Essential (primary) hypertension: Secondary | ICD-10-CM

## 2020-04-25 MED ORDER — AMLODIPINE BESYLATE 10 MG PO TABS: ORAL_TABLET | ORAL | 1 refills | Status: DC

## 2020-04-25 NOTE — Telephone Encounter (Signed)
Pt's spouse called in to update provider. Pt's insurance has approved medications metoprolol succinate (TOPROL-XL) 25 MG 24 hr tablet and Dulaglutide (TRULICITY) 4.64 VX/4.2JA SOPN. Pt would like to have Rx's sent in to pharmacy as soon as possible.   Pharmacy:  [facility] [place], [place] [facility] Phone:  [phone]  Fax:  [phone]

## 2020-04-25 NOTE — Telephone Encounter (Signed)
Called pharmacy to advised that medication was approved per wife and pharmacy stated that insurance did approve patient's Trulicity. Also the pharmacy states that patient can not get Metoprolol until 05/29/2020 due to insurance not covering until then.  Called patient's wife [name] [name]) and advised her medication was at pharmacy ready to be picked up and patient can not get Metoprolol until 05/29/2020 due to insurance not covering cost then.

## 2020-04-25 NOTE — Addendum Note (Signed)
Addended by: Dan Maker on: 04/25/2020 04:22 PM   Modules accepted: Orders

## 2020-06-18 IMAGING — US US ABDOMEN COMPLETE
1 series · 14 of 25 positions shown · non-contrast
Comparison: CT scan December 05, 2017

CLINICAL DATA: Left upper quadrant pain.

EXAM:
ABDOMEN ULTRASOUND COMPLETE

[Series 1: us abdomen complete · 0.26mm/px · 14 of 73 slices shown]
[im 1/73]
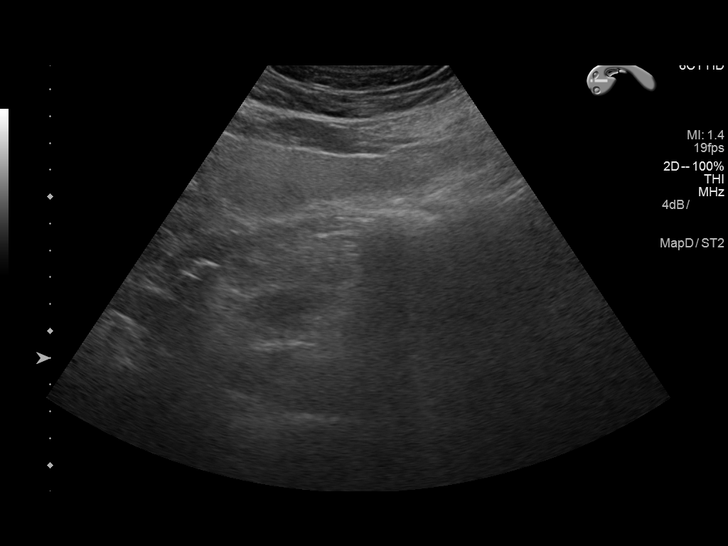
[im 7/73]
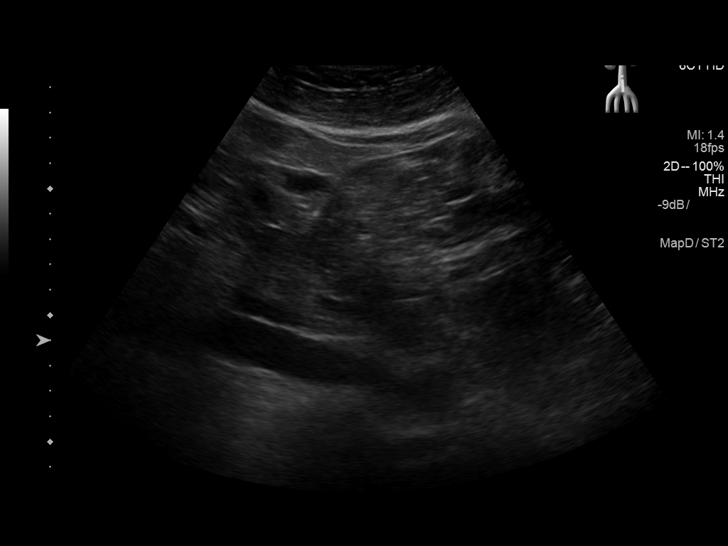
[im 13/73]
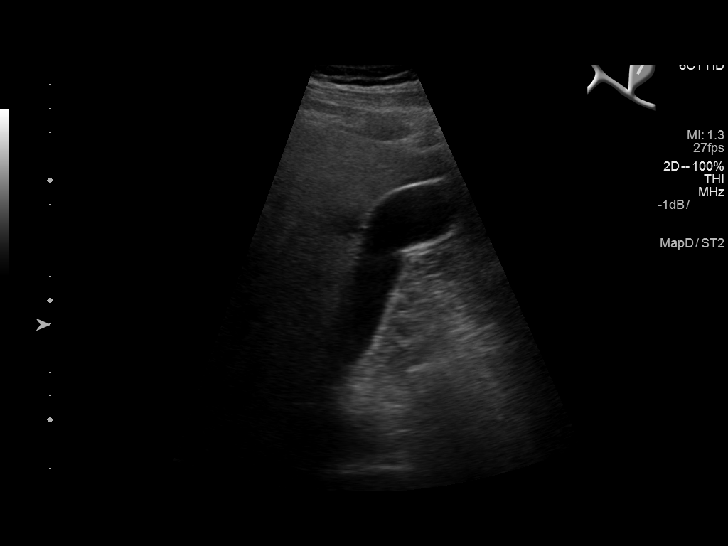
[im 19/73]
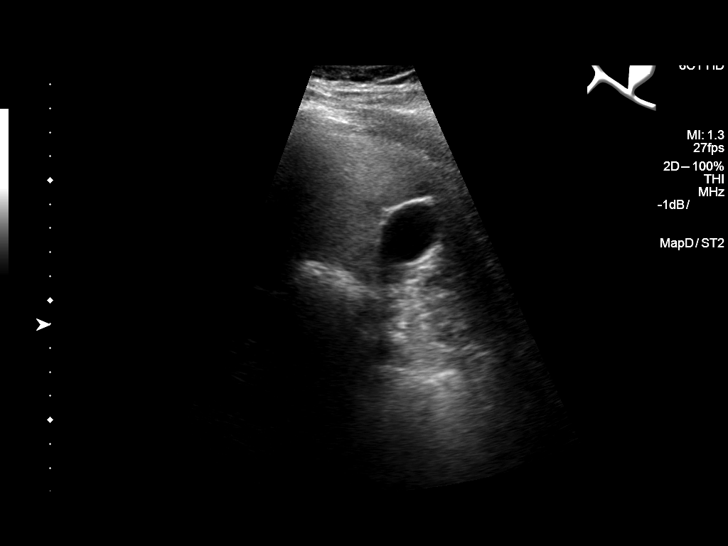
[im 25/73]
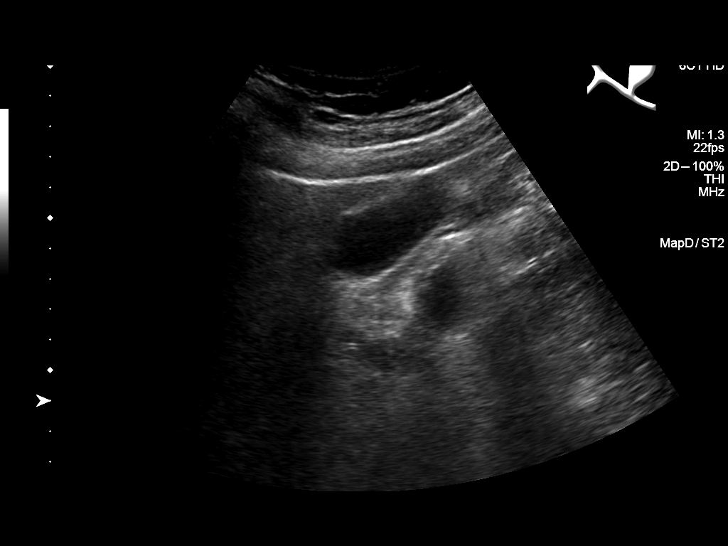
[im 28/73]
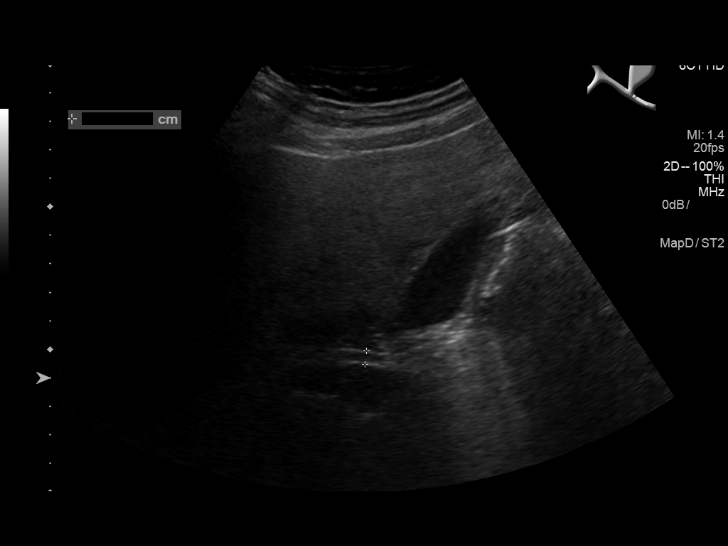
[im 34/73]
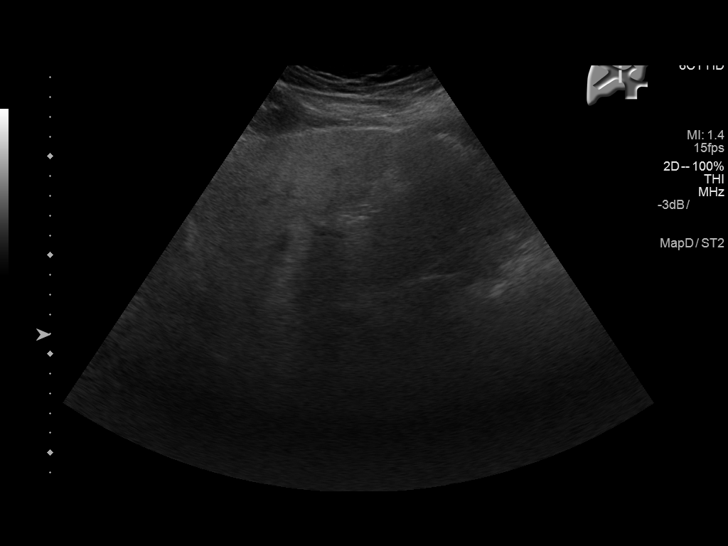
[im 40/73]
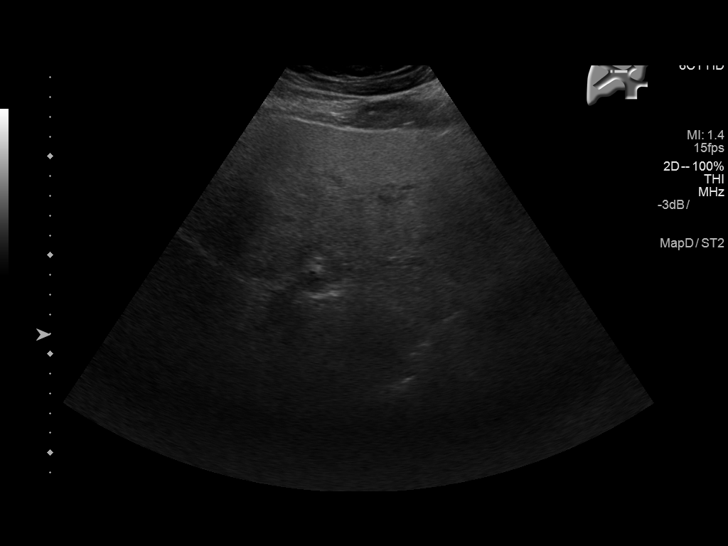
[im 46/73]
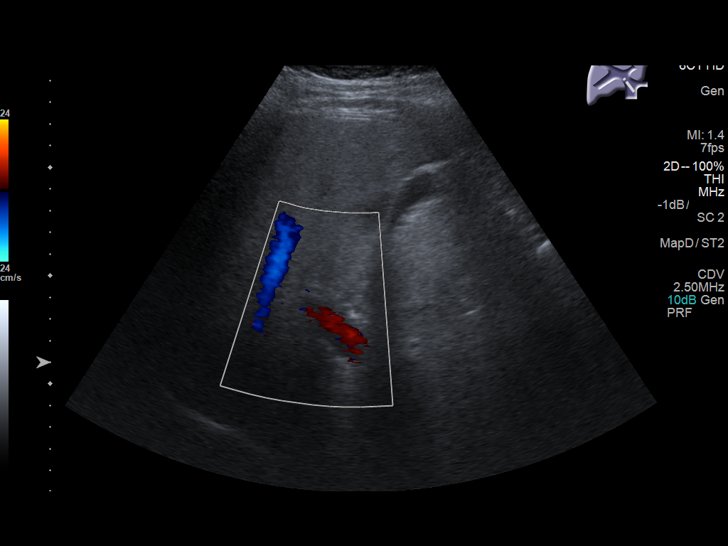
[im 49/73]
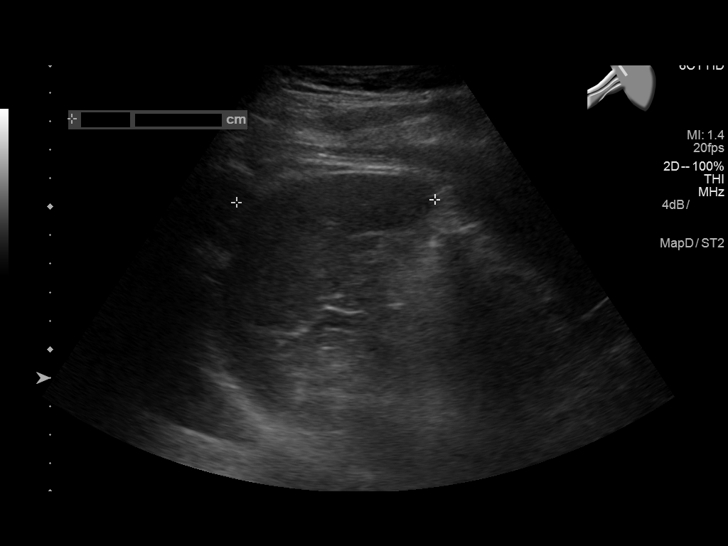
[im 55/73]
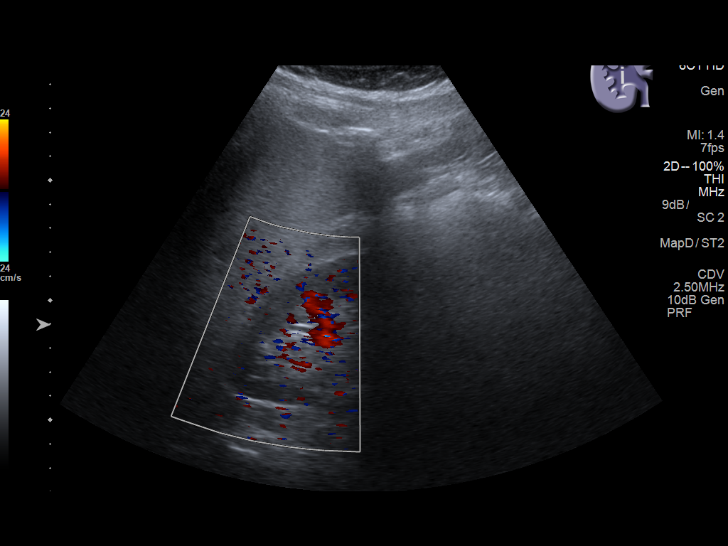
[im 61/73]
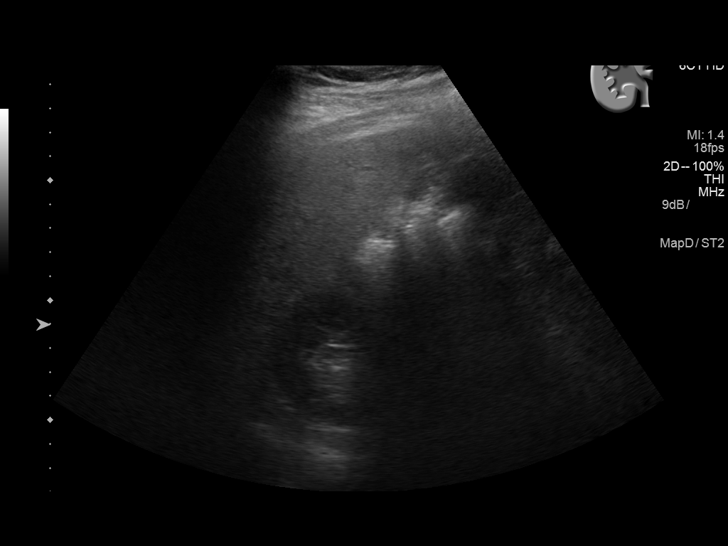
[im 67/73]
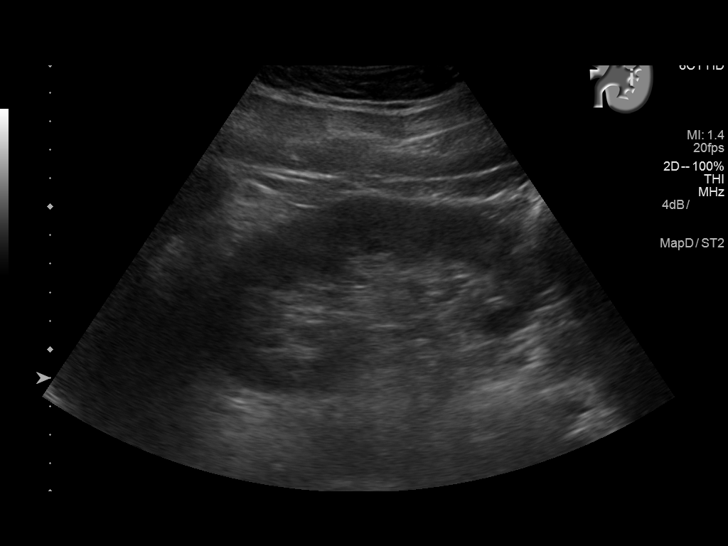
[im 73/73]
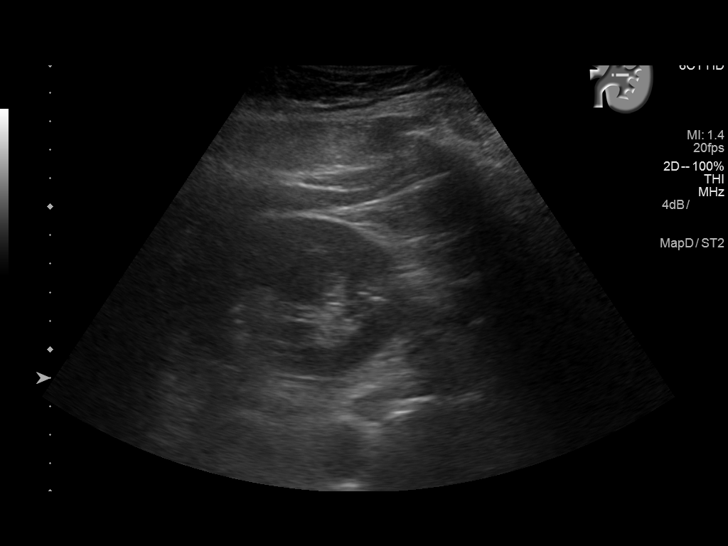

[14 of 25 positions shown; findings below may reference images not displayed]

FINDINGS: Gallbladder: No gallstones or wall thickening visualized. No
sonographic Murphy sign noted by sonographer.

Common bile duct: Diameter: 4.8 mm

Liver: Hepatic steatosis. Portal vein is patent on color Doppler
imaging with normal direction of blood flow towards the liver.

IVC: No abnormality visualized.

Pancreas: Visualized portion unremarkable.

Spleen: Size and appearance within normal limits.

Right Kidney: Length: 13.7 cm. Echogenicity within normal limits. No
mass or hydronephrosis visualized.

Left Kidney: Length: 13.5 cm. Echogenicity within normal limits. No
mass or hydronephrosis visualized.

Abdominal aorta: No aneurysm visualized.

Other findings: None.
IMPRESSION: 1. Hepatic steatosis.  No other abnormalities.

## 2020-06-19 ENCOUNTER — Telehealth: Payer: Self-pay

## 2020-06-19 NOTE — Telephone Encounter (Signed)
Copied from [place] [id number]. Topic: General - Inquiry >> Jun 19, 2020  8:49 AM Gillis Ends D wrote: Reason for CRM: Patient's wife called stating that when they went to pick up his Trulicity, the pharmacy told him that he needed a prior authorization. So she needs that approval. She wants to know if he can switch to something that is covered by his insurance so he doesn't have to go through this prior approval stage every time he needs it. She can be reached at [phone]. Please advise

## 2020-06-19 NOTE — Telephone Encounter (Signed)
Since he has an appointment on 06/25/2020, let's talk about changing the medication then. If he is going to come off the trulicity, one week without it won't change much.

## 2020-06-19 NOTE — Telephone Encounter (Signed)
Patient's wife Rolland Bimler) was advised.

## 2020-06-21 LAB — HM DIABETES EYE EXAM

## 2020-06-25 ENCOUNTER — Ambulatory Visit: Admitting: Physician Assistant

## 2020-06-26 NOTE — Progress Notes (Signed)
MyChart Video Visit    Virtual Visit via Video Note   This visit type was conducted due to national recommendations for restrictions regarding the COVID-19 Pandemic (e.g. social distancing) in an effort to limit this patient's exposure and mitigate transmission in our community. This patient is at least at moderate risk for complications without adequate follow up. This format is felt to be most appropriate for this patient at this time. Physical exam was limited by quality of the video and audio technology used for the visit.   Patient location: Home Provider location: Office   I discussed the limitations of evaluation and management by telemedicine and the availability of in person appointments. The patient expressed understanding and agreed to proceed.  Patient: [name] [name]   DOB: 25-May-1976   45 y.o. Male  MRN: [id number] Visit Date: 06/27/2020  Today's healthcare provider: [name], PA-C   Chief Complaint  Patient presents with  . Diabetes   Subjective    HPI  Diabetes Mellitus Type II, Follow-up  Lab Results  Component Value Date   HGBA1C 7.3 (A) 03/22/2020   HGBA1C 12.0 (A) 09/20/2019   HGBA1C 9.5 (A) 06/22/2019   Wt Readings from Last 3 Encounters:  03/22/20 269 lb (122 kg)  12/20/19 265 lb (120.2 kg)  12/20/19 264 lb (119.7 kg)   Last seen for diabetes 3 months ago.  Management since then includes continue current medication. He was continued on trulicity 6.44 mg subQ once weekly. Today he tells me he hasn't taken his metformin 1000 mg BID since one week prior to last visit. He reports he stopped this medication due to side effects, however these side effects occurred when he also started victoza.  He reports good compliance with treatment. He is not having side effects.  Symptoms: No fatigue No foot ulcerations  No appetite changes No nausea  No paresthesia of the feet  No polydipsia  No polyuria No visual disturbances   No vomiting     Home  blood sugar records:   Episodes of hypoglycemia? No    Current insulin regiment:patient was using Trulicity Most Recent Eye Exam: Reports he got it 4 days ago at [facility] Current exercise: aerobics Current diet habits: not asked  Pertinent Labs: Lab Results  Component Value Date   CHOL 159 12/20/2018   HDL 40 12/20/2018   LDLCALC 69 12/20/2018   LDLDIRECT 129 (H) 06/24/2018   TRIG 252 (H) 12/20/2018   CHOLHDL 4.0 12/20/2018   Lab Results  Component Value Date   NA 139 12/20/2019   K 3.7 12/20/2019   CREATININE 0.67 12/20/2019   GFRNONAA >60 12/20/2019   GFRAA >60 12/20/2019   GLUCOSE 111 (H) 12/20/2019     Hypertension, follow-up  BP Readings from Last 3 Encounters:  03/22/20 (!) 130/92  12/20/19 (!) 120/93  12/20/19 129/90   Wt Readings from Last 3 Encounters:  03/22/20 269 lb (122 kg)  12/20/19 265 lb (120.2 kg)  12/20/19 264 lb (119.7 kg)     He was last seen for hypertension 3 months ago.  BP at that visit was elevated. Management since that visit includes continue medications.  He reports good compliance with treatment. He is not having side effects.  He is following a Regular diet. He is exercising. He does not smoke.  Use of agents associated with hypertension: none.   Outside blood pressures are not checked. Symptoms: No chest pain No chest pressure  No palpitations No syncope  No  dyspnea No orthopnea  No paroxysmal nocturnal dyspnea No lower extremity edema   Pertinent labs: Lab Results  Component Value Date   CHOL 159 12/20/2018   HDL 40 12/20/2018   LDLCALC 69 12/20/2018   LDLDIRECT 129 (H) 06/24/2018   TRIG 252 (H) 12/20/2018   CHOLHDL 4.0 12/20/2018   Lab Results  Component Value Date   NA 139 12/20/2019   K 3.7 12/20/2019   CREATININE 0.67 12/20/2019   GFRNONAA >60 12/20/2019   GFRAA >60 12/20/2019   GLUCOSE 111 (H) 12/20/2019     The 10-year ASCVD risk score Mikey Bussing DC Jr., et al., 2013) is: 11%    --------------------------------------------------------------------------------------------------- Lipid/Cholesterol, Follow-up  Last lipid panel Other pertinent labs  Lab Results  Component Value Date   CHOL 159 12/20/2018   HDL 40 12/20/2018   LDLCALC 69 12/20/2018   LDLDIRECT 129 (H) 06/24/2018   TRIG 252 (H) 12/20/2018   CHOLHDL 4.0 12/20/2018   Lab Results  Component Value Date   ALT 46 (H) 12/20/2019   AST 31 12/20/2019   PLT 263 12/20/2019   TSH 0.566 04/11/2018     He was last seen for this 3 months ago.  Management since that visit includes continue medications.  He reports good compliance with treatment. He is not having side effects.   Symptoms: No chest pain No chest pressure/discomfort  No dyspnea No lower extremity edema  No numbness or tingling of extremity No orthopnea  No palpitations No paroxysmal nocturnal dyspnea  No speech difficulty No syncope   Current diet: not asked Current exercise: none  The 10-year ASCVD risk score Mikey Bussing DC Jr., et al., 2013) is: 11%  ---------------------------------------------------------------------------------------------------   ---------------------------------------------------------------------------------------------------     Medications: Outpatient Medications Prior to Visit  Medication Sig  . ACCU-CHEK GUIDE test strip USE TO CHECK SUGARS EVERY MORNING  . amLODipine (NORVASC) 10 MG tablet TAKE 1 TABLET(10 MG) BY MOUTH DAILY  . aspirin 81 MG tablet Take 81 mg by mouth daily.  . cyclobenzaprine (FLEXERIL) 5 MG tablet Take 1 tablet (5 mg total) by mouth 2 (two) times daily as needed for muscle spasms.  . Dulaglutide (TRULICITY) 0.94 BS/9.6GE SOPN Inject 0.75 mg into the skin once a week.  . ezetimibe (ZETIA) 10 MG tablet TAKE 1 TABLET(10 MG) BY MOUTH DAILY  . Insulin Pen Needle (NOVOFINE) 32G X 6 MM MISC Use once daily to inject victoza.  . metoprolol succinate (TOPROL-XL) 25 MG 24 hr tablet TAKE 1  TABLET(25 MG) BY MOUTH AT BEDTIME  . pantoprazole (PROTONIX) 40 MG tablet TAKE 1 TABLET(40 MG) BY MOUTH DAILY  . rosuvastatin (CRESTOR) 40 MG tablet TAKE 1 TABLET(40 MG) BY MOUTH DAILY  . nitroGLYCERIN (NITROSTAT) 0.4 MG SL tablet Place 1 tablet (0.4 mg total) under the tongue every 5 (five) minutes as needed for chest pain. Maximum of 3 doses.  [name] QUEtiapine (SEROQUEL XR) 50 MG TB24 24 hr tablet Take 1 tablet (50 mg total) by mouth at bedtime.  . [DISCONTINUED] liraglutide (VICTOZA) 18 MG/3ML SOPN Start 0.6mg  SQ once a day for 7 days, then increase to 1.2mg  once a day (Patient not taking: No sig reported)  . [DISCONTINUED] metFORMIN (GLUCOPHAGE) 1000 MG tablet Take 1 tablet (1,000 mg total) by mouth 2 (two) times daily with a meal. (Patient not taking: No sig reported)   No facility-administered medications prior to visit.    Review of Systems    Objective    There were no vitals taken for this visit.  Physical Exam Constitutional:      Appearance: Normal appearance.  Pulmonary:     Effort: Pulmonary effort is normal. No respiratory distress.  Neurological:     Mental Status: He is alert.  Psychiatric:        Mood and Affect: Mood normal.        Behavior: Behavior normal.        Assessment & Plan    1. Essential hypertension   Continue medications.  - HgB A1c - Lipid panel - Comprehensive metabolic panel - CBC with Differential/Platelet  2. Diabetes mellitus without complication ([place])  Was not aware he stopped metformin, only that victoza caused side effects and thus we switched to trulicity. His last A1c would not have reflected this change. Will get labs and see what his A1c is. Also explained prior authorization is likely yearly for trulicity and only happened so close together because he changed insurances at the end of the year. Will move forward with [id number] mg trulicity PA and if A999333 uncontrolled he wants to increase trulicity.  - HgB 123456 - Lipid panel -  Comprehensive metabolic panel - CBC with Differential/Platelet  3. Hyperlipidemia associated with type 2 diabetes mellitus (HCC)  Continue statin.   Return in about 3 months (around 09/25/2020) for chronic .     I discussed the assessment and treatment plan with the patient. The patient was provided an opportunity to ask questions and all were answered. The patient agreed with the plan and demonstrated an understanding of the instructions.   The patient was advised to call back or seek an in-person evaluation if the symptoms worsen or if the condition fails to improve as anticipated.   ITrinna Post, PA-C, have reviewed all documentation for this visit. The documentation on 06/27/20 for the exam, diagnosis, procedures, and orders are all accurate and complete.  The entirety of the information documented in the History of Present Illness, Review of Systems and Physical Exam were personally obtained by me. Portions of this information were initially documented by Wilburt Finlay, CMA and reviewed by me for thoroughness and accuracy.    Paulene Floor [facility] [phone] (phone) [phone] (fax)  [place]

## 2020-06-27 ENCOUNTER — Telehealth (INDEPENDENT_AMBULATORY_CARE_PROVIDER_SITE_OTHER): Admitting: Physician Assistant

## 2020-06-27 DIAGNOSIS — E119 Type 2 diabetes mellitus without complications: Secondary | ICD-10-CM

## 2020-06-27 DIAGNOSIS — E785 Hyperlipidemia, unspecified: Secondary | ICD-10-CM

## 2020-06-27 DIAGNOSIS — I10 Essential (primary) hypertension: Secondary | ICD-10-CM | POA: Diagnosis not present

## 2020-06-27 DIAGNOSIS — E1169 Type 2 diabetes mellitus with other specified complication: Secondary | ICD-10-CM

## 2020-07-08 ENCOUNTER — Telehealth: Payer: Self-pay | Admitting: Physician Assistant

## 2020-07-08 NOTE — Telephone Encounter (Signed)
Caller name: Mrs. Baray  Relation to pt: spouse  Call back number: [phone]  Pharmacy:  [facility] [place], [place] [facility] Phone:  [phone]  Fax:  [phone]       Reason for call:  Caller wanted to inform PCP, patient unable to receive trulicity and would like PCP to prescribe Victoza 1x daily. Please advise caller today regarding status

## 2020-07-08 NOTE — Telephone Encounter (Signed)
Pharmacy called to request a prior auth for patient's medication for Dulaglutide (TRULICITY) 2.29 NL/8.9QJ SOPN.  Please advise and call patient if there is a question at [phone]

## 2020-07-08 NOTE — Telephone Encounter (Signed)
I think Rachelle started this, can we check?

## 2020-07-08 NOTE — Telephone Encounter (Signed)
See request below, pharmacy called earlier requesting pre-authorization for prescription written. KW

## 2020-07-09 ENCOUNTER — Telehealth: Payer: Self-pay

## 2020-07-09 DIAGNOSIS — E119 Type 2 diabetes mellitus without complications: Secondary | ICD-10-CM

## 2020-07-09 LAB — CBC WITH DIFFERENTIAL/PLATELET
Basophils Absolute: 0 10*3/uL (ref 0.0–0.2)
Basos: 1 %
EOS (ABSOLUTE): 0.1 10*3/uL (ref 0.0–0.4)
Eos: 1 %
Hematocrit: 40.5 % (ref 37.5–51.0)
Hemoglobin: 14.1 g/dL (ref 13.0–17.7)
Immature Grans (Abs): 0 10*3/uL (ref 0.0–0.1)
Immature Granulocytes: 0 %
Lymphocytes Absolute: 2.5 10*3/uL (ref 0.7–3.1)
Lymphs: 49 %
MCH: 30.9 pg (ref 26.6–33.0)
MCHC: 34.8 g/dL (ref 31.5–35.7)
MCV: 89 fL (ref 79–97)
Monocytes Absolute: 0.3 10*3/uL (ref 0.1–0.9)
Monocytes: 6 %
Neutrophils Absolute: 2.2 10*3/uL (ref 1.4–7.0)
Neutrophils: 43 %
Platelets: 248 10*3/uL (ref 150–450)
RBC: 4.56 x10E6/uL (ref 4.14–5.80)
RDW: 13.2 % (ref 11.6–15.4)
WBC: 5.2 10*3/uL (ref 3.4–10.8)

## 2020-07-09 LAB — LIPID PANEL
Chol/HDL Ratio: 4.3 ratio (ref 0.0–5.0)
Cholesterol, Total: 175 mg/dL (ref 100–199)
HDL: 41 mg/dL (ref >39)
LDL Chol Calc (NIH): 97 mg/dL (ref 0–99)
Triglycerides: 217 mg/dL — ABNORMAL HIGH (ref 0–149)
VLDL Cholesterol Cal: 37 mg/dL (ref 5–40)

## 2020-07-09 LAB — COMPREHENSIVE METABOLIC PANEL
ALT: 40 IU/L (ref 0–44)
AST: 23 IU/L (ref 0–40)
Albumin/Globulin Ratio: 1.8 (ref 1.2–2.2)
Albumin: 4.9 g/dL (ref 4.0–5.0)
Alkaline Phosphatase: 65 IU/L (ref 44–121)
BUN/Creatinine Ratio: 9 (ref 9–20)
BUN: 8 mg/dL (ref 6–24)
Bilirubin Total: 0.2 mg/dL (ref 0.0–1.2)
CO2: 20 mmol/L (ref 20–29)
Calcium: 9.9 mg/dL (ref 8.7–10.2)
Chloride: 102 mmol/L (ref 96–106)
Creatinine, Ser: 0.93 mg/dL (ref 0.76–1.27)
GFR calc Af Amer: 117 mL/min/{1.73_m2} (ref >59)
GFR calc non Af Amer: 101 mL/min/{1.73_m2} (ref >59)
Globulin, Total: 2.7 g/dL (ref 1.5–4.5)
Glucose: 214 mg/dL — ABNORMAL HIGH (ref 65–99)
Potassium: 4.2 mmol/L (ref 3.5–5.2)
Sodium: 140 mmol/L (ref 134–144)
Total Protein: 7.6 g/dL (ref 6.0–8.5)

## 2020-07-09 LAB — HEMOGLOBIN A1C
Est. average glucose Bld gHb Est-mCnc: 186 mg/dL
Hgb A1c MFr Bld: 8.1 % — ABNORMAL HIGH (ref 4.8–5.6)

## 2020-07-09 NOTE — Telephone Encounter (Signed)
Patient's wife Rolland Bimler) was advised. She reports that he started back taking his Trulicity on [id number],3/60/6770. Patient and wife would like to know what will be the next dose for the Trulicity?

## 2020-07-09 NOTE — Telephone Encounter (Signed)
Patient's wife Rolland Bimler) was advised that Trulicity medication was approved through patients insurance and that [place] w/ [employer] states that medication doesn't require a PA and the medication was getting rejected due to the way the pharmacy had file it.

## 2020-07-09 NOTE — Telephone Encounter (Signed)
-----   Message from [name], [place] sent at 07/09/2020  4:24 PM EST ----- A1c worse as expected after stopping metformin. Once trulicity goes through we can titrate up on this.

## 2020-07-11 NOTE — Telephone Encounter (Signed)
It would be 1.5 mg subQ weekly, we would have to send in new pens which have the dose as it is a single use/dose pen.

## 2020-07-12 NOTE — Telephone Encounter (Signed)
Patient was advised and states that it is fine.

## 2020-07-15 NOTE — Telephone Encounter (Signed)
Pt's wife said husband needs to take a dose of the Trulicity today and it has not been sent to the pharmacy let  [address]  CB#  [phone]

## 2020-07-17 MED ORDER — TRULICITY 1.5 MG/0.5ML ~~LOC~~ SOAJ: 1.5000 mg | SUBCUTANEOUS | 2 refills | Status: DC

## 2020-07-17 NOTE — Telephone Encounter (Signed)
I sent in the 1.5 mg dose once a week which is the next dose up.

## 2020-07-18 NOTE — Telephone Encounter (Signed)
Patient's wife Gerda Diss) was advised.

## 2020-07-19 ENCOUNTER — Other Ambulatory Visit: Payer: Self-pay | Admitting: Physician Assistant

## 2020-07-19 DIAGNOSIS — R079 Chest pain, unspecified: Secondary | ICD-10-CM

## 2020-07-24 ENCOUNTER — Other Ambulatory Visit: Payer: Self-pay

## 2020-07-24 ENCOUNTER — Ambulatory Visit (INDEPENDENT_AMBULATORY_CARE_PROVIDER_SITE_OTHER): Payer: Medicaid Other | Admitting: Physician Assistant

## 2020-07-24 ENCOUNTER — Encounter: Payer: Self-pay | Admitting: Physician Assistant

## 2020-07-24 VITALS — BP 152/99 | HR 91 | Temp 98.0°F | Resp 16 | Ht 75.0 in | Wt 271.0 lb

## 2020-07-24 DIAGNOSIS — R109 Unspecified abdominal pain: Secondary | ICD-10-CM

## 2020-07-24 DIAGNOSIS — R110 Nausea: Secondary | ICD-10-CM

## 2020-07-24 MED ORDER — ONDANSETRON HCL 4 MG PO TABS: 4.0000 mg | ORAL_TABLET | Freq: Three times a day (TID) | ORAL | 0 refills | Status: DC | PRN

## 2020-07-24 MED ORDER — PROMETHAZINE HCL 25 MG/ML IJ SOLN: 12.5000 mg | Freq: Once | INTRAMUSCULAR | Status: DC

## 2020-07-24 MED ORDER — HYDROCODONE-ACETAMINOPHEN 5-325 MG PO TABS: 1.0000 | ORAL_TABLET | Freq: Three times a day (TID) | ORAL | 0 refills | Status: AC

## 2020-07-24 NOTE — Patient Instructions (Signed)
Acute Pancreatitis  Acute pancreatitis happens when the pancreas gets swollen. The pancreas is a large gland in the body that helps to control blood sugar. It also makes enzymes that help to digest food. This condition can last a few days and cause serious problems. The lungs, heart, and kidneys may stop working. What are the causes? Causes include:  Alcohol abuse.  Drug abuse.  Gallstones.  A tumor in the pancreas. Other causes include:  Some medicines.  Some chemicals.  Diabetes.  An infection.  Damage caused by an accident.  The poison (venom) from a scorpion bite.  Belly (abdominal) surgery.  The body's defense system (immune system) attacking the pancreas (autoimmune pancreatitis).  Genes that are passed from parent to child (inherited). In some cases, the cause is not known. What are the signs or symptoms?  Pain in the upper belly that may be felt in the back. The pain may be very bad.  Swelling of the belly.  Feeling sick to your stomach (nauseous) and throwing up (vomiting).  Fever. How is this treated? You will likely have to stay in the hospital. Treatment may include:  Pain medicine.  Fluid through an IV tube.  Placing a tube in the stomach to take out the stomach contents. This may help you stop throwing up.  Not eating for 3-4 days.  Antibiotic medicines, if you have an infection.  Treating any other problems that may be the cause.  Steroid medicines, if your problem is caused by your defense system attacking your body's own tissues.  Surgery. Follow these instructions at home: Eating and drinking  Follow instructions from your doctor about what to eat and drink.  Eat foods that do not have a lot of fat in them.  Eat small meals often. Do not eat big meals.  Drink enough fluid to keep your pee (urine) pale yellow.  Do not drink alcohol if it caused your condition.   Medicines  Take over-the-counter and prescription medicines only  as told by your doctor.  Ask your doctor if the medicine prescribed to you: ? Requires you to avoid driving or using heavy machinery. ? Can cause trouble pooping (constipation). You may need to take steps to prevent or treat trouble pooping:  Take over-the-counter or prescription medicines.  Eat foods that are high in fiber. These include beans, whole grains, and fresh fruits and vegetables.  Limit foods that are high in fat and sugar. These include fried or sweet foods. General instructions  Do not use any products that contain nicotine or tobacco, such as cigarettes, e-cigarettes, and chewing tobacco. If you need help quitting, ask your doctor.  Get plenty of rest.  Check your blood sugar at home as told by your doctor.  Keep all follow-up visits as told by your doctor. This is important. Contact a doctor if:  You do not get better as quickly as expected.  You have new symptoms.  Your symptoms get worse.  You have pain or weakness that lasts a long time.  You keep feeling sick to your stomach.  You get better and then you have pain again.  You have a fever. Get help right away if:  You cannot eat or keep fluids down.  Your pain gets very bad.  Your skin or the white part of your eyes turns yellow.  You have sudden swelling in your belly.  You throw up.  You feel dizzy or you pass out (faint).  Your blood sugar is high (over   300 mg/dL). Summary  Acute pancreatitis happens when the pancreas gets swollen.  This condition is often caused by alcohol abuse, drug abuse, or gallstones.  You will likely have to stay in the hospital for treatment. This information is not intended to replace advice given to you by your health care provider. Make sure you discuss any questions you have with your health care provider. Document Revised: 03/14/2018 Document Reviewed: 03/14/2018 Elsevier Patient Education  2021 Elsevier Inc.  

## 2020-07-24 NOTE — Progress Notes (Signed)
Established patient visit   Patient: [name] [name]   DOB: 1976/03/09   45 y.o. Male  MRN: [id number] Visit Date: 07/24/2020  Today's healthcare provider: [name], PA-C   Chief Complaint  Patient presents with  . Abdominal Pain  . Nausea   Subjective    Abdominal Pain This is a new problem. The current episode started yesterday. The onset quality is sudden. The pain is located in the generalized abdominal region. The pain is moderate. The quality of the pain is aching. Associated symptoms include diarrhea and nausea.     Patient is obese male with history of Type II DM on trulicity presenting today with abdominal pain. He has been on trulicity for several months and this past Monday increased to the 1.5 mg subq once weekly dose. Afterwards he began to have vomiting, diarrhea, abdominal pain. Has been able to keep down most food. Denies fevers or chills. Abdominal pain is central and persistent. He has no history of gallstones. Does have a history hepatic steatosis. He does drink some alcohol.      Medications: Outpatient Medications Prior to Visit  Medication Sig  . ACCU-CHEK GUIDE test strip USE TO CHECK SUGARS EVERY MORNING  . amLODipine (NORVASC) 10 MG tablet TAKE 1 TABLET(10 MG) BY MOUTH DAILY  . aspirin 81 MG tablet Take 81 mg by mouth daily.  . cyclobenzaprine (FLEXERIL) 5 MG tablet Take 1 tablet (5 mg total) by mouth 2 (two) times daily as needed for muscle spasms.  . Dulaglutide (TRULICITY) 1.5 CN/4.7SJ SOPN Inject 1.5 mg into the skin once a week.  . ezetimibe (ZETIA) 10 MG tablet TAKE 1 TABLET(10 MG) BY MOUTH DAILY  . Insulin Pen Needle (NOVOFINE) 32G X 6 MM MISC Use once daily to inject victoza.  . metoprolol succinate (TOPROL-XL) 25 MG 24 hr tablet TAKE 1 TABLET(25 MG) BY MOUTH AT BEDTIME  . nitroGLYCERIN (NITROSTAT) 0.4 MG SL tablet PLACE 1 TABLET UNDER THE TONGUE EVERY 5 MINUTES AS NEEDED FOR CHEST PAIN  . pantoprazole (PROTONIX) 40 MG tablet TAKE 1  TABLET(40 MG) BY MOUTH DAILY  . rosuvastatin (CRESTOR) 40 MG tablet TAKE 1 TABLET(40 MG) BY MOUTH DAILY  . QUEtiapine (SEROQUEL XR) 50 MG TB24 24 hr tablet Take 1 tablet (50 mg total) by mouth at bedtime.   No facility-administered medications prior to visit.    Review of Systems  Gastrointestinal: Positive for abdominal pain, diarrhea and nausea.       Objective    BP (!) 152/99   Pulse 91   Temp 98 F (36.7 C)   Resp 16   Ht 6\' 3"  (1.905 m)   Wt 271 lb (122.9 kg)   BMI 33.87 kg/m     Physical Exam Constitutional:      Comments: Patient appears uncomfortable.   Cardiovascular:     Rate and Rhythm: Normal rate and regular rhythm.     Heart sounds: Normal heart sounds.  Pulmonary:     Effort: Pulmonary effort is normal.     Breath sounds: Normal breath sounds.  Abdominal:     Palpations: Abdomen is soft.     Tenderness: There is abdominal tenderness in the epigastric area.  Skin:    General: Skin is warm and dry.  Psychiatric:        Mood and Affect: Mood normal.        Behavior: Behavior normal.       No results found for any visits on 07/24/20.  Assessment & Plan    1. Abdominal pain, unspecified abdominal location  Suspect pancreatitis 2/2 trulicity though must also consider adverse effects from trulicity, diverticulitis or other colitis. Advised patient he should be seen in ER, he declines currently. Will attempt outpatient workup with STAT CT, pain management and nausea medication. Advised clear liquids until reduction of pain. Advised if worsening should be seen in the ER. Discontinue trulicity.  - Lipase - Comprehensive Metabolic Panel (CMET) - CBC with Differential - Amylase - CT Abdomen Pelvis W Contrast - ondansetron (ZOFRAN) 4 MG tablet; Take 1 tablet (4 mg total) by mouth every 8 (eight) hours as needed for nausea or vomiting.  Dispense: 20 tablet; Refill: 0 - promethazine (PHENERGAN) injection 12.5 mg - HYDROcodone-acetaminophen (NORCO/VICODIN)  5-325 MG tablet; Take 1 tablet by mouth every 8 (eight) hours for 5 days.  Dispense: 15 tablet; Refill: 0  2. Nausea  - promethazine (PHENERGAN) injection 12.5 mg - HYDROcodone-acetaminophen (NORCO/VICODIN) 5-325 MG tablet; Take 1 tablet by mouth every 8 (eight) hours for 5 days.  Dispense: 15 tablet; Refill: 0   No follow-ups on file.      ITrinna Post, PA-C, have reviewed all documentation for this visit. The documentation on 07/24/20 for the exam, diagnosis, procedures, and orders are all accurate and complete.  The entirety of the information documented in the History of Present Illness, Review of Systems and Physical Exam were personally obtained by me. Portions of this information were initially documented by Wilburt Finlay, CMA and reviewed by me for thoroughness and accuracy.    I spent 40 minutes dedicated to the care of this patient on the date of this encounter to include pre-visit review of records, face-to-face time with the patient discussing pancreatitis, and post visit ordering of testing.  Paulene Floor  [facility] [phone] (phone) [phone] (fax)  [place]

## 2020-07-25 ENCOUNTER — Telehealth: Payer: Self-pay

## 2020-07-25 ENCOUNTER — Ambulatory Visit
Admission: RE | Admit: 2020-07-25 | Discharge: 2020-07-25 | Disposition: A | Discharge status: Home / Self Care | Source: Ambulatory Visit | Attending: Physician Assistant | Admitting: Physician Assistant

## 2020-07-25 DIAGNOSIS — R109 Unspecified abdominal pain: Secondary | ICD-10-CM | POA: Diagnosis not present

## 2020-07-25 DIAGNOSIS — R1110 Vomiting, unspecified: Secondary | ICD-10-CM | POA: Diagnosis not present

## 2020-07-25 LAB — COMPREHENSIVE METABOLIC PANEL WITH GFR
ALT: 56 IU/L — ABNORMAL HIGH (ref 0–44)
AST: 39 IU/L (ref 0–40)
Albumin/Globulin Ratio: 1.9 (ref 1.2–2.2)
Albumin: 5.4 g/dL — ABNORMAL HIGH (ref 4.0–5.0)
Alkaline Phosphatase: 73 IU/L (ref 44–121)
BUN/Creatinine Ratio: 9 (ref 9–20)
BUN: 9 mg/dL (ref 6–24)
Bilirubin Total: 0.4 mg/dL (ref 0.0–1.2)
CO2: 20 mmol/L (ref 20–29)
Calcium: 10.7 mg/dL — ABNORMAL HIGH (ref 8.7–10.2)
Chloride: 102 mmol/L (ref 96–106)
Creatinine, Ser: 0.97 mg/dL (ref 0.76–1.27)
GFR calc Af Amer: 110 mL/min/1.73
GFR calc non Af Amer: 95 mL/min/1.73
Globulin, Total: 2.8 g/dL (ref 1.5–4.5)
Glucose: 145 mg/dL — ABNORMAL HIGH (ref 65–99)
Potassium: 4.8 mmol/L (ref 3.5–5.2)
Sodium: 140 mmol/L (ref 134–144)
Total Protein: 8.2 g/dL (ref 6.0–8.5)

## 2020-07-25 LAB — CBC WITH DIFFERENTIAL/PLATELET
Basophils Absolute: 0 10*3/uL (ref 0.0–0.2)
Basos: 0 %
EOS (ABSOLUTE): 0.1 10*3/uL (ref 0.0–0.4)
Eos: 1 %
Hematocrit: 48.1 % (ref 37.5–51.0)
Hemoglobin: 16.2 g/dL (ref 13.0–17.7)
Immature Grans (Abs): 0 10*3/uL (ref 0.0–0.1)
Immature Granulocytes: 0 %
Lymphocytes Absolute: 2 10*3/uL (ref 0.7–3.1)
Lymphs: 27 %
MCH: 30.8 pg (ref 26.6–33.0)
MCHC: 33.7 g/dL (ref 31.5–35.7)
MCV: 91 fL (ref 79–97)
Monocytes Absolute: 0.4 10*3/uL (ref 0.1–0.9)
Monocytes: 5 %
Neutrophils Absolute: 5.1 10*3/uL (ref 1.4–7.0)
Neutrophils: 67 %
Platelets: 264 10*3/uL (ref 150–450)
RBC: 5.26 x10E6/uL (ref 4.14–5.80)
RDW: 14.3 % (ref 11.6–15.4)
WBC: 7.6 10*3/uL (ref 3.4–10.8)

## 2020-07-25 LAB — AMYLASE: Amylase: 97 U/L (ref 31–110)

## 2020-07-25 LAB — LIPASE: Lipase: 40 U/L (ref 13–78)

## 2020-07-25 MED ORDER — IOHEXOL 300 MG/ML  SOLN
125.0000 mL | Freq: Once | INTRAMUSCULAR | Status: AC | PRN
  Administered 2020-07-25: 125 mL via INTRAVENOUS

## 2020-07-25 NOTE — Telephone Encounter (Signed)
PTs wife was calling back and wanted to speak with Porsha. Please advise.

## 2020-07-25 NOTE — Telephone Encounter (Signed)
It looks like they are already in the chart as well  Copied from [place] [id number]. Topic: General - Other >> Jul 24, 2020  6:52 PM Pawlus, Brayton Layman A wrote: Reason for CRM: [facility] called stating they have the STAT results available. Please CB for results [phone].

## 2020-07-25 NOTE — Telephone Encounter (Signed)
Called patient and no answer, [name] [name].

## 2020-07-25 NOTE — Telephone Encounter (Signed)
Return patient's wife call and she states that the patient reports he will come by here tomorrow, 07/26/2020 to pick up his results to take with him to [facility] on Monday,07/29/2020.

## 2020-07-25 NOTE — Telephone Encounter (Signed)
-----   Message from [name], [place] sent at 07/25/2020 12:49 PM EST ----- Can we let patient know CT scan does not show pancreatitis. Pancreatic enzymes are normal. It does show a possible low grade or early appendicitis, which can explain his symptoms. I would recommend going to the ER for further evaluation. If he goes to another ER outside of [facility], they would likely repeat the image as they won't be able to see it electronically.

## 2020-07-25 NOTE — Telephone Encounter (Signed)
Patient and wife returned call and was advised. Patient states he will go to the ER as advised.

## 2020-07-29 ENCOUNTER — Other Ambulatory Visit: Payer: Self-pay | Admitting: Physician Assistant

## 2020-07-29 DIAGNOSIS — E1142 Type 2 diabetes mellitus with diabetic polyneuropathy: Secondary | ICD-10-CM

## 2020-07-29 DIAGNOSIS — E1169 Type 2 diabetes mellitus with other specified complication: Secondary | ICD-10-CM

## 2020-07-29 DIAGNOSIS — E785 Hyperlipidemia, unspecified: Secondary | ICD-10-CM

## 2020-07-30 ENCOUNTER — Encounter: Payer: Self-pay | Admitting: Physician Assistant

## 2020-07-30 ENCOUNTER — Telehealth: Payer: Self-pay

## 2020-07-30 ENCOUNTER — Other Ambulatory Visit: Payer: Self-pay

## 2020-07-30 ENCOUNTER — Ambulatory Visit (INDEPENDENT_AMBULATORY_CARE_PROVIDER_SITE_OTHER): Admitting: Physician Assistant

## 2020-07-30 VITALS — BP 121/82 | HR 75 | Temp 98.6°F | Wt 276.9 lb

## 2020-07-30 DIAGNOSIS — E1169 Type 2 diabetes mellitus with other specified complication: Secondary | ICD-10-CM | POA: Diagnosis not present

## 2020-07-30 DIAGNOSIS — E1142 Type 2 diabetes mellitus with diabetic polyneuropathy: Secondary | ICD-10-CM | POA: Diagnosis not present

## 2020-07-30 DIAGNOSIS — E785 Hyperlipidemia, unspecified: Secondary | ICD-10-CM | POA: Diagnosis not present

## 2020-07-30 DIAGNOSIS — M25512 Pain in left shoulder: Secondary | ICD-10-CM | POA: Diagnosis not present

## 2020-07-30 DIAGNOSIS — G8929 Other chronic pain: Secondary | ICD-10-CM

## 2020-07-30 MED ORDER — TRULICITY 0.75 MG/0.5ML ~~LOC~~ SOAJ: 0.7500 mg | SUBCUTANEOUS | 2 refills | Status: DC

## 2020-07-30 NOTE — Telephone Encounter (Signed)
Transition Care Management Follow-up Telephone Call  Date of discharge and from where: 07/29/2020 form [facility]  How have you been since you were released from the hospital? Pt stated that he is feeling much better today.   Any questions or concerns? No  Items Reviewed:  Did the pt receive and understand the discharge instructions provided? Yes   Medications obtained and verified? Yes   Other? No   Any new allergies since your discharge? No   Dietary orders reviewed? N/A  Do you have support at home? Yes   Functional Questionnaire: (I = Independent and D = Dependent) ADLs: I  Bathing/Dressing- I  Meal Prep- I  Eating- I  Maintaining continence- I  Transferring/Ambulation- I  Managing Meds- I   Follow up appointments reviewed:   PCP Hospital f/u appt confirmed? Yes  Scheduled to see [name], PA-C on 07/30/2020 @ 3:00pm.   Are transportation arrangements needed? No   If their condition worsens, is the pt aware to call PCP or go to the Emergency Dept.? Yes  Was the patient provided with contact information for the PCP's office or ED? Yes  Was to pt encouraged to call back with questions or concerns? Yes

## 2020-07-30 NOTE — Progress Notes (Signed)
Established patient visit   Patient: [name] [name]   DOB: 1976/06/08   45 y.o. Male  MRN: [id number] Visit Date: 07/30/2020  Today's healthcare provider: [name], PA-C   Chief Complaint  Patient presents with  . Follow-up  I,Porsha C McClurkin,acting as a scribe for [name], PA-C.,have documented all relevant documentation on the behalf of [name], PA-C,as directed by  [name], PA-C while in the presence of [name], PA-C.  Subjective    HPI  Follow up for abdominal pain  The patient was last seen for this 1 weeks ago. Changes made at last visit include stopping trulicity 1.5 mg subQ. He had profound abdominal pain and vomiting shortly after starting higher dose of Trulicity for diabetes. CBC was normal without white count, pancreatic enzymes were normal. CT scan did not reveal pancreatitis, showed questionable acute appendicitis. Advised to go to the ER. Patient went to ER several days later at [facility] and was rescanned, there was no concern for pancreatitis or appendicitis on that visit.   Patient feeling much better today. Eating and drinking normally. He has taken his old 0.75 mg subQ once weekly trulicity yesterday and feels fine. He reports his abdominal pain has subsided.  -----------------------------------------------------------------------------------------       Medications: Outpatient Medications Prior to Visit  Medication Sig  . ACCU-CHEK GUIDE test strip USE TO CHECK SUGARS EVERY MORNING  . amLODipine (NORVASC) 10 MG tablet TAKE 1 TABLET(10 MG) BY MOUTH DAILY  . aspirin 81 MG tablet Take 81 mg by mouth daily.  . cyclobenzaprine (FLEXERIL) 5 MG tablet Take 1 tablet (5 mg total) by mouth 2 (two) times daily as needed for muscle spasms.  . Dulaglutide (TRULICITY) 1.5 LA/4.5XM SOPN Inject 1.5 mg into the skin once a week.  . ezetimibe (ZETIA) 10 MG tablet TAKE 1 TABLET(10 MG) BY MOUTH DAILY  . Insulin Pen Needle  (NOVOFINE) 32G X 6 MM MISC Use once daily to inject victoza.  . metoprolol succinate (TOPROL-XL) 25 MG 24 hr tablet TAKE 1 TABLET(25 MG) BY MOUTH AT BEDTIME  . nitroGLYCERIN (NITROSTAT) 0.4 MG SL tablet PLACE 1 TABLET UNDER THE TONGUE EVERY 5 MINUTES AS NEEDED FOR CHEST PAIN  . ondansetron (ZOFRAN) 4 MG tablet Take 1 tablet (4 mg total) by mouth every 8 (eight) hours as needed for nausea or vomiting.  . pantoprazole (PROTONIX) 40 MG tablet TAKE 1 TABLET(40 MG) BY MOUTH DAILY  . QUEtiapine (SEROQUEL XR) 50 MG TB24 24 hr tablet Take 1 tablet (50 mg total) by mouth at bedtime.  . rosuvastatin (CRESTOR) 40 MG tablet TAKE 1 TABLET(40 MG) BY MOUTH DAILY   Facility-Administered Medications Prior to Visit  Medication Dose Route Frequency Provider  . promethazine (PHENERGAN) injection 12.5 mg  12.5 mg Intramuscular Once [name], PA-C    Review of Systems  Constitutional: Negative.   Respiratory: Negative.   Cardiovascular: Negative.   Hematological: Negative.        Objective    BP 121/82 (BP Location: Right Arm, Patient Position: Sitting, Cuff Size: Large)   Pulse 75   Temp 98.6 F (37 C) (Oral)   Wt 276 lb 14.4 oz (125.6 kg)   SpO2 100%   BMI 34.61 kg/m     Physical Exam Constitutional:      Appearance: Normal appearance. He is obese.  Cardiovascular:     Rate and Rhythm: Normal rate and regular rhythm.     Heart sounds:  Normal heart sounds.  Pulmonary:     Effort: Pulmonary effort is normal.     Breath sounds: Normal breath sounds.  Abdominal:     General: Bowel sounds are normal.     Palpations: Abdomen is soft.  Skin:    General: Skin is warm and dry.  Neurological:     General: No focal deficit present.     Mental Status: He is alert and oriented to person, place, and time.  Psychiatric:        Mood and Affect: Mood normal.        Behavior: Behavior normal.       No results found for any visits on 07/30/20.  Assessment & Plan    1. Hyperlipidemia  associated with type 2 diabetes mellitus (HCC)  - Dulaglutide (TRULICITY) 1.54 MG/8.6PY SOPN; Inject 0.75 mg into the skin once a week.  Dispense: 2 mL; Refill: 2 - Ambulatory referral to Gastroenterology  2. Diabetic polyneuropathy associated with type 2 diabetes mellitus ([place])  Discussed switching from trulicity to another medicine. Patient has taken lower dose and feels comfortable proceeding with this as he has tolerated it in the past before. He feels higher A1c is related to running out of the medicine due to insurance issues. Will plan on 3 months of trulicity and f/u for DM.   - Dulaglutide (TRULICITY) 1.95 KD/3.2IZ SOPN; Inject 0.75 mg into the skin once a week.  Dispense: 2 mL; Refill: 2 - Ambulatory referral to Gastroenterology  3. Chronic left shoulder pain  - Ambulatory referral to Orthopedic Surgery   No follow-ups on file.      ITrinna Post, PA-C, have reviewed all documentation for this visit. The documentation on 08/02/20 for the exam, diagnosis, procedures, and orders are all accurate and complete.  The entirety of the information documented in the History of Present Illness, Review of Systems and Physical Exam were personally obtained by me. Portions of this information were initially documented by [facility] and reviewed by me for thoroughness and accuracy.     Paulene Floor  [facility] [phone] (phone) [phone] (fax)  [place]

## 2020-07-31 ENCOUNTER — Ambulatory Visit: Payer: Medicaid Other | Admitting: Physician Assistant

## 2020-08-01 ENCOUNTER — Telehealth: Payer: Self-pay | Admitting: Physician Assistant

## 2020-08-01 ENCOUNTER — Other Ambulatory Visit: Payer: Self-pay

## 2020-08-01 ENCOUNTER — Emergency Department
Admission: EM | Admit: 2020-08-01 | Discharge: 2020-08-01 | Disposition: A | Discharge status: Home / Self Care | Attending: Emergency Medicine | Admitting: Emergency Medicine

## 2020-08-01 ENCOUNTER — Ambulatory Visit: Payer: Self-pay | Admitting: *Deleted

## 2020-08-01 DIAGNOSIS — E1169 Type 2 diabetes mellitus with other specified complication: Secondary | ICD-10-CM | POA: Diagnosis not present

## 2020-08-01 DIAGNOSIS — Z87891 Personal history of nicotine dependence: Secondary | ICD-10-CM | POA: Diagnosis not present

## 2020-08-01 DIAGNOSIS — E1142 Type 2 diabetes mellitus with diabetic polyneuropathy: Secondary | ICD-10-CM | POA: Diagnosis not present

## 2020-08-01 DIAGNOSIS — R1084 Generalized abdominal pain: Secondary | ICD-10-CM | POA: Diagnosis not present

## 2020-08-01 DIAGNOSIS — R112 Nausea with vomiting, unspecified: Secondary | ICD-10-CM

## 2020-08-01 DIAGNOSIS — Z7982 Long term (current) use of aspirin: Secondary | ICD-10-CM | POA: Insufficient documentation

## 2020-08-01 DIAGNOSIS — E785 Hyperlipidemia, unspecified: Secondary | ICD-10-CM | POA: Diagnosis not present

## 2020-08-01 DIAGNOSIS — R197 Diarrhea, unspecified: Secondary | ICD-10-CM

## 2020-08-01 DIAGNOSIS — Z79899 Other long term (current) drug therapy: Secondary | ICD-10-CM | POA: Diagnosis not present

## 2020-08-01 DIAGNOSIS — R5383 Other fatigue: Secondary | ICD-10-CM | POA: Insufficient documentation

## 2020-08-01 DIAGNOSIS — T383X5A Adverse effect of insulin and oral hypoglycemic [antidiabetic] drugs, initial encounter: Secondary | ICD-10-CM | POA: Diagnosis not present

## 2020-08-01 DIAGNOSIS — R079 Chest pain, unspecified: Secondary | ICD-10-CM | POA: Insufficient documentation

## 2020-08-01 DIAGNOSIS — I10 Essential (primary) hypertension: Secondary | ICD-10-CM | POA: Diagnosis not present

## 2020-08-01 DIAGNOSIS — I2510 Atherosclerotic heart disease of native coronary artery without angina pectoris: Secondary | ICD-10-CM | POA: Insufficient documentation

## 2020-08-01 DIAGNOSIS — T50905A Adverse effect of unspecified drugs, medicaments and biological substances, initial encounter: Secondary | ICD-10-CM

## 2020-08-01 DIAGNOSIS — Z794 Long term (current) use of insulin: Secondary | ICD-10-CM | POA: Insufficient documentation

## 2020-08-01 LAB — CBC
HCT: 44.8 % (ref 39.0–52.0)
Hemoglobin: 15.7 g/dL (ref 13.0–17.0)
MCH: 31.7 pg (ref 26.0–34.0)
MCHC: 35 g/dL (ref 30.0–36.0)
MCV: 90.3 fL (ref 80.0–100.0)
Platelets: 281 10*3/uL (ref 150–400)
RBC: 4.96 MIL/uL (ref 4.22–5.81)
RDW: 13.7 % (ref 11.5–15.5)
WBC: 7.1 10*3/uL (ref 4.0–10.5)
nRBC: 0 % (ref 0.0–0.2)

## 2020-08-01 LAB — LIPASE, BLOOD: Lipase: 34 U/L (ref 11–51)

## 2020-08-01 LAB — COMPREHENSIVE METABOLIC PANEL
ALT: 33 U/L (ref 0–44)
AST: 19 U/L (ref 15–41)
Albumin: 4.5 g/dL (ref 3.5–5.0)
Alkaline Phosphatase: 53 U/L (ref 38–126)
Anion gap: 10 (ref 5–15)
BUN: 10 mg/dL (ref 6–20)
CO2: 20 mmol/L — ABNORMAL LOW (ref 22–32)
Calcium: 9.3 mg/dL (ref 8.9–10.3)
Chloride: 108 mmol/L (ref 98–111)
Creatinine, Ser: 0.8 mg/dL (ref 0.61–1.24)
GFR, Estimated: >60 mL/min (ref >60)
Glucose, Bld: 151 mg/dL — ABNORMAL HIGH (ref 70–99)
Potassium: 3.9 mmol/L (ref 3.5–5.1)
Sodium: 138 mmol/L (ref 135–145)
Total Bilirubin: 0.8 mg/dL (ref 0.3–1.2)
Total Protein: 8.1 g/dL (ref 6.5–8.1)

## 2020-08-01 LAB — BLOOD GAS, VENOUS
Acid-base deficit: 1.5 mmol/L (ref 0.0–2.0)
Bicarbonate: 22.9 mmol/L (ref 20.0–28.0)
O2 Saturation: 93.5 %
Patient temperature: 37
pCO2, Ven: 37 mmHg — ABNORMAL LOW (ref 44.0–60.0)
pH, Ven: 7.4 (ref 7.250–7.430)
pO2, Ven: 69 mmHg — ABNORMAL HIGH (ref 32.0–45.0)

## 2020-08-01 LAB — TROPONIN I (HIGH SENSITIVITY): Troponin I (High Sensitivity): 2 ng/L (ref <18)

## 2020-08-01 MED ORDER — PROMETHAZINE HCL 25 MG PO TABS: 25.0000 mg | ORAL_TABLET | Freq: Four times a day (QID) | ORAL | 0 refills | Status: DC | PRN

## 2020-08-01 MED ORDER — DIPHENOXYLATE-ATROPINE 2.5-0.025 MG PO TABS: 1.0000 | ORAL_TABLET | Freq: Four times a day (QID) | ORAL | 0 refills | Status: AC | PRN

## 2020-08-01 MED ORDER — DICYCLOMINE HCL 10 MG PO CAPS
20.0000 mg | ORAL_CAPSULE | Freq: Once | ORAL | Status: AC
  Administered 2020-08-01: 20 mg via ORAL
  Filled 2020-08-01: qty 2

## 2020-08-01 MED ORDER — DIPHENOXYLATE-ATROPINE 2.5-0.025 MG PO TABS
2.0000 | ORAL_TABLET | Freq: Once | ORAL | Status: AC
  Administered 2020-08-01: 2 via ORAL
  Filled 2020-08-01: qty 2

## 2020-08-01 MED ORDER — DICYCLOMINE HCL 20 MG PO TABS: 20.0000 mg | ORAL_TABLET | Freq: Three times a day (TID) | ORAL | 0 refills | Status: DC

## 2020-08-01 MED ORDER — MORPHINE SULFATE (PF) 4 MG/ML IV SOLN
4.0000 mg | Freq: Once | INTRAVENOUS | Status: AC
  Administered 2020-08-01: 4 mg via INTRAVENOUS
  Filled 2020-08-01: qty 1

## 2020-08-01 MED ORDER — LACTATED RINGERS IV BOLUS
1000.0000 mL | Freq: Once | INTRAVENOUS | Status: AC
  Administered 2020-08-01: 1000 mL via INTRAVENOUS

## 2020-08-01 MED ORDER — ONDANSETRON HCL 4 MG/2ML IJ SOLN
4.0000 mg | Freq: Once | INTRAMUSCULAR | Status: AC
  Administered 2020-08-01: 4 mg via INTRAVENOUS
  Filled 2020-08-01: qty 2

## 2020-08-01 MED ORDER — KETOROLAC TROMETHAMINE 30 MG/ML IJ SOLN
15.0000 mg | Freq: Once | INTRAMUSCULAR | Status: AC
  Administered 2020-08-01: 15 mg via INTRAVENOUS
  Filled 2020-08-01: qty 1

## 2020-08-01 NOTE — ED Provider Notes (Signed)
[facility] Emergency Department Provider Note  ____________________________________________   Event Date/Time   First MD Initiated Contact with Patient 08/01/20 1511     (approximate)  I have reviewed the triage vital signs and the nursing notes.   HISTORY  Chief Complaint Abdominal Pain    HPI [name] [name] is a 45 y.o. male with history of DKA, hypertension, hyperlipidemia, here with generalized weakness, nausea, vomiting.   Patient states that for the last week, he has had persistent nausea, vomiting, and diarrhea.  He has been essentially unable to eat or drink.  He states that his symptoms seem to be related to when he gets his Trulicity injections.  He has been taking these for the last several months, and his most recent injection was on Monday.  Starting on Tuesday, his symptoms worsened and he has been essentially unable to keep anything down.  He has associated diffuse abdominal cramping.  He has been seen twice for the symptoms recently, and had a negative CT just several days ago at [facility].  No recent fevers this week.  He states he had decreased urine output.  Feels generally fatigued and weak.  He has been having some diffuse cramping.  He does note some intermittent chest pain as well with some radiation towards the shoulder, though he is unsure if this is related to his vomiting. He has been trying zofran and phenergan at home w/o significant relief.       Past Medical History:  Diagnosis Date  . ADHD (attention deficit hyperactivity disorder)   . Allergy   . Arthritis   . CAD (coronary artery disease)    S/P cath May 2012 with BMS to the LAD following abnormal stress test  . Diabetes mellitus   . Drug abuse ([place])    history of marijuana use and alcohol  . GERD (gastroesophageal reflux disease)   . Hypercholesterolemia   . Hypertension   . Myocardial infarction ([place])   . Obesity   . Oxygen deficiency   . Sleep apnea     Patient  Active Problem List   Diagnosis Date Noted  . Poorly controlled diabetes mellitus ([place]) 09/14/2019  . Obstructive sleep apnea 09/14/2019  . Class 2 severe obesity with serious comorbidity and body mass index (BMI) of 37.0 to 37.9 in adult ([place]) 03/22/2019  . Diabetic polyneuropathy associated with type 2 diabetes mellitus ([place]) 03/22/2019  . Coronary artery disease involving native coronary artery of native heart without angina pectoris 09/19/2018  . Hyperlipidemia LDL goal <70 09/19/2018  . Hematochezia 01/19/2018  . Back pain 10/23/2011  . Heart palpitations 03/02/2011  . CAD (coronary artery disease) 10/29/2010  . Diabetes mellitus without complication ([place]) 25/36/6440  . Essential hypertension   . Heartburn   . Atypical chest pain   . Hyperlipidemia associated with type 2 diabetes mellitus [facility])     Past Surgical History:  Procedure Laterality Date  . ABSCESS DRAINAGE     right arm; approx 2003  . CARDIAC CATHETERIZATION    . CORONARY ANGIOPLASTY    . CORONARY STENT PLACEMENT  May 2012   LAD    Prior to Admission medications   Medication Sig Start Date End Date Taking? Authorizing Provider  dicyclomine (BENTYL) 20 MG tablet Take 1 tablet (20 mg total) by mouth 4 (four) times daily -  before meals and at bedtime for 5 days. 08/01/20 08/06/20 Yes Duffy Bruce, MD  diphenoxylate-atropine (LOMOTIL) 2.5-0.025 MG tablet Take 1 tablet by mouth 4 (  four) times daily as needed for up to 3 days for diarrhea or loose stools. 08/01/20 08/04/20 Yes Duffy Bruce, MD  promethazine (PHENERGAN) 25 MG tablet Take 1 tablet (25 mg total) by mouth every 6 (six) hours as needed for nausea or vomiting. 08/01/20  Yes Duffy Bruce, MD  ACCU-CHEK GUIDE test strip USE TO CHECK SUGARS EVERY MORNING 04/05/20   [name], PA-C  amLODipine (NORVASC) 10 MG tablet TAKE 1 TABLET(10 MG) BY MOUTH DAILY 04/25/20   [name], PA-C  aspirin 81 MG tablet Take 81 mg by mouth daily.    [provider]  cyclobenzaprine (FLEXERIL) 5 MG tablet Take 1 tablet (5 mg total) by mouth 2 (two) times daily as needed for muscle spasms. 05/13/18   [name], PA-C  Dulaglutide (TRULICITY) 5.62 ZH/0.8MV SOPN Inject 0.75 mg into the skin once a week. 07/30/20   [name], PA-C  ezetimibe (ZETIA) 10 MG tablet TAKE 1 TABLET(10 MG) BY MOUTH DAILY 11/20/19   Dunn, Areta Haber, PA-C  Insulin Pen Needle (NOVOFINE) 32G X 6 MM MISC Use once daily to inject victoza. 10/13/19   [name], PA-C  metoprolol succinate (TOPROL-XL) 25 MG 24 hr tablet TAKE 1 TABLET(25 MG) BY MOUTH AT BEDTIME 04/25/20   [name], PA-C  nitroGLYCERIN (NITROSTAT) 0.4 MG SL tablet PLACE 1 TABLET UNDER THE TONGUE EVERY 5 MINUTES AS NEEDED FOR CHEST PAIN 07/19/20   [name], PA-C  ondansetron (ZOFRAN) 4 MG tablet Take 1 tablet (4 mg total) by mouth every 8 (eight) hours as needed for nausea or vomiting. 07/24/20   [name], PA-C  pantoprazole (PROTONIX) 40 MG tablet TAKE 1 TABLET(40 MG) BY MOUTH DAILY 03/14/20   [name] M, PA-C  QUEtiapine (SEROQUEL XR) 50 MG TB24 24 hr tablet Take 1 tablet (50 mg total) by mouth at bedtime. 06/22/19 09/20/19  [name], PA-C  rosuvastatin (CRESTOR) 40 MG tablet TAKE 1 TABLET(40 MG) BY MOUTH DAILY 07/29/20   [name], PA-C    Allergies Penicillins  Family History  Problem Relation Age of Onset  . Hypertension Mother   . Diabetes Mother   . Hypertension Father   . Diabetes Father   . Heart disease Father        cabg 3; 1995; 18 stents  . Hypertension Sister   . Heart disease Paternal Uncle   . Heart disease Paternal Grandmother     Social History Social History   Tobacco Use  . Smoking status: Former Smoker    Packs/day: 1.00    Years: 14.00    Pack years: 14.00    Types: Cigarettes    Quit date: 06/2017    Years since quitting: 3.1  . Smokeless tobacco: Never Used  Vaping Use  . Vaping Use: Never used  Substance Use  Topics  . Alcohol use: Yes    Alcohol/week: 10.0 standard drinks    Types: 2 Cans of beer, 8 Shots of liquor per week  . Drug use: Yes    Frequency: 20.0 times per week    Types: Marijuana    Review of Systems  Review of Systems  Constitutional: Positive for appetite change and fatigue. Negative for chills and fever.  HENT: Negative for sore throat.   Respiratory: Negative for shortness of breath.   Cardiovascular: Negative for chest pain.  Gastrointestinal: Positive for abdominal pain, diarrhea, nausea and vomiting.  Genitourinary: Negative for flank pain.  Musculoskeletal: Negative  for neck pain.  Skin: Negative for rash and wound.  Allergic/Immunologic: Negative for immunocompromised state.  Neurological: Positive for weakness. Negative for numbness.  Hematological: Does not bruise/bleed easily.  All other systems reviewed and are negative.    ____________________________________________  PHYSICAL EXAM:      VITAL SIGNS: ED Triage Vitals  Enc Vitals Group     BP 08/01/20 1350 (!) 138/95     Pulse Rate 08/01/20 1350 92     Resp 08/01/20 1350 18     Temp 08/01/20 1350 97.7 F (36.5 C)     Temp Source 08/01/20 1350 Oral     SpO2 08/01/20 1350 100 %     Weight 08/01/20 1351 270 lb (122.5 kg)     Height 08/01/20 1351 6\' 3"  (1.905 m)     Head Circumference --      Peak Flow --      Pain Score 08/01/20 1351 8     Pain Loc --      Pain Edu? --      Excl. in [place]? --      Physical Exam Vitals and nursing note reviewed.  Constitutional:      General: He is not in acute distress.    Appearance: He is well-developed.  HENT:     Head: Normocephalic and atraumatic.  Eyes:     Conjunctiva/sclera: Conjunctivae normal.  Cardiovascular:     Rate and Rhythm: Normal rate and regular rhythm.     Heart sounds: Normal heart sounds. No murmur heard. No friction rub.  Pulmonary:     Effort: Pulmonary effort is normal. No respiratory distress.     Breath sounds: Normal breath  sounds. No wheezing or rales.  Abdominal:     General: There is no distension.     Palpations: Abdomen is soft.     Tenderness: There is generalized abdominal tenderness. There is no guarding or rebound. Negative signs include Murphy's sign and Rovsing's sign.  Musculoskeletal:     Cervical back: Neck supple.  Skin:    General: Skin is warm.     Capillary Refill: Capillary refill takes less than 2 seconds.  Neurological:     Mental Status: He is alert and oriented to person, place, and time.     Motor: No abnormal muscle tone.       ____________________________________________   LABS (all labs ordered are listed, but only abnormal results are displayed)  Labs Reviewed  COMPREHENSIVE METABOLIC PANEL - Abnormal; Notable for the following components:      Result Value   CO2 20 (*)    Glucose, Bld 151 (*)    All other components within normal limits  BLOOD GAS, VENOUS - Abnormal; Notable for the following components:   pCO2, Ven 37 (*)    pO2, Ven 69.0 (*)    All other components within normal limits  LIPASE, BLOOD  CBC  URINALYSIS, COMPLETE (UACMP) WITH MICROSCOPIC  TROPONIN I (HIGH SENSITIVITY)    ____________________________________________  EKG: Normal sinus rhythm, tach rate 78.  QRS 1 6, QTc 440.  Prolonged PR interval.  No acute ST elevations or depressions. ________________________________________  RADIOLOGY All imaging, including plain films, CT scans, and ultrasounds, independently reviewed by me, and interpretations confirmed via formal radiology reads.  ED MD interpretation:   Reviewed CT from 2/22 - no acute intra-abd pathology  Official radiology report(s): No results found.  ____________________________________________  PROCEDURES   Procedure(s) performed (including Critical Care):  Procedures  ____________________________________________  INITIAL IMPRESSION /  MDM / ASSESSMENT AND PLAN / ED COURSE  As part of my medical decision making, I  reviewed the following data within the [place] notes reviewed and incorporated, Old chart reviewed, Notes from prior ED visits, and [place] Controlled Substance Database       *Jerre Vandrunen was evaluated in Emergency Department on 08/01/2020 for the symptoms described in the history of present illness. He was evaluated in the context of the global COVID-19 pandemic, which necessitated consideration that the patient might be at risk for infection with the SARS-CoV-2 virus that causes COVID-19. Institutional protocols and algorithms that pertain to the evaluation of patients at risk for COVID-19 are in a state of rapid change based on information released by regulatory bodies including the CDC and federal and state organizations. These policies and algorithms were followed during the patient's care in the ED.  Some ED evaluations and interventions may be delayed as a result of limited staffing during the pandemic.*     Medical Decision Making: 45 year old male with nausea, vomiting, diarrhea.  This has been a fairly recurrent issue and seems to be related to his Trulicity injections, which do have a significant side effect profile of nausea, vomiting, and diarrhea.  His lab work today is very reassuring.  He has no leukocytosis.  CMP normal with normal LFTs and lipase.  Renal function is normal.  Blood gas without signs of DKA.  Troponin negative and EKG is nonischemic.  Of note, the patient has had 2 CT scans, most recently 2 days ago, which showed no evidence of appendicitis and his abdominal exam is more consistent with mild diffuse tenderness due to his vomiting.  Given the absence of any leukocytosis with recent negative CT and no other exam findings of appendicitis, do not feel this is likely.  Moreover, I suspect his symptoms seem related to Trulicity as they seem to be a recurrent issue to days or so after each injection every week.  Will have him discuss with his PCP.  Will  add supportive care for now.  Encourage fluids.  He feels markedly better after Bentyl and antidiarrheals in the ED and is tolerating p.o.  ____________________________________________  FINAL CLINICAL IMPRESSION(S) / ED DIAGNOSES  Final diagnoses:  Generalized abdominal pain  Nausea vomiting and diarrhea  Adverse effect of drug, initial encounter     MEDICATIONS GIVEN DURING THIS VISIT:  Medications  lactated ringers bolus 1,000 mL (1,000 mLs Intravenous New Bag/Given 08/01/20 1531)  ondansetron (ZOFRAN) injection 4 mg (4 mg Intravenous Given 08/01/20 1531)  dicyclomine (BENTYL) capsule 20 mg (20 mg Oral Given 08/01/20 1543)  diphenoxylate-atropine (LOMOTIL) 2.5-0.025 MG per tablet 2 tablet (2 tablets Oral Given 08/01/20 1542)  lactated ringers bolus 1,000 mL (1,000 mLs Intravenous New Bag/Given 08/01/20 1554)  ketorolac (TORADOL) 30 MG/ML injection 15 mg (15 mg Intravenous Given 08/01/20 1628)  morphine 4 MG/ML injection 4 mg (4 mg Intravenous Given 08/01/20 1627)     ED Discharge Orders         Ordered    diphenoxylate-atropine (LOMOTIL) 2.5-0.025 MG tablet  4 times daily PRN        08/01/20 1827    dicyclomine (BENTYL) 20 MG tablet  3 times daily before meals & bedtime        08/01/20 1827    promethazine (PHENERGAN) 25 MG tablet  Every 6 hours PRN        08/01/20 1827  Note:  This document was prepared using Dragon voice recognition software and may include unintentional dictation errors.   Duffy Bruce, MD 08/01/20 [phone]

## 2020-08-01 NOTE — Telephone Encounter (Signed)
Pt wife [name] [name] is calling and would like adriana to return her call.Pt husband is at [facility] for diarrhea and vomiting etc the triage nurse told him to go to er with symptoms he is having. Pt wife said he has heart condition and she would like er to do ekg. Pt is not having chest pain etc. Please call wife back

## 2020-08-01 NOTE — Telephone Encounter (Signed)
Patient was admitted into the hospital at [facility].

## 2020-08-01 NOTE — ED Notes (Signed)
Patient attempting to eat crackers and drink water for PO Challenge.

## 2020-08-01 NOTE — ED Triage Notes (Signed)
Pt c/o abd pain with N/V/D , states he thinks it is related to his insulin injection, states last injection was Monday and started feeling bad on Tuesday, but then states he was seen on Monday and went to [facility] had a negative CT but continues to have the same sx.[name]

## 2020-08-01 NOTE — Telephone Encounter (Signed)
Pt's wife called stating the pt started 10-15 times and watery diarrhea each time he vomits; he has not been able to keep anything down; Zofran has not relieved his symptoms; recommendations made per nurse triage protocol; he verbalized understanding and will go to the ED; the pt is seen by [name], [facility]; will route to office for notification  Reason for Disposition . [1] SEVERE vomiting (e.g., 6 or more times/day) AND [2] present > 8 hours (Exception: patient sounds well, is drinking liquids, does not sound dehydrated, and vomiting has lasted less than 24 hours)  Answer Assessment - Initial Assessment Questions 1. VOMITING SEVERITY: "How many times have you vomited in the past 24 hours?"     - MILD:  1 - 2 times/day    - MODERATE: 3 - 5 times/day, decreased oral intake without significant weight loss or symptoms of dehydration    - SEVERE: 6 or more times/day, vomits everything or nearly everything, with significant weight loss, symptoms of dehydration      10-15 times 2. ONSET: "When did the vomiting begin?"     07/31/20 3. FLUIDS: "What fluids or food have you vomited up today?" "Have you been able to keep any fluids down?"     Not able to keep anything down 4. ABDOMINAL PAIN: "Are your having any abdominal pain?" If yes : "How bad is it and what does it feel like?" (e.g., crampy, dull, intermittent, constant)     Yes rated 6-7 out 10; dull/aching; intermittent 5. DIARRHEA: "Is there any diarrhea?" If Yes, ask: "How many times today?"      Yes, 10-15 times, watery 6. CONTACTS: "Is there anyone else in the family with the same symptoms?"      7. CAUSE: "What do you think is causing your vomiting?"     Previously seen in ED and MD office due to symptoms 8. HYDRATION STATUS: "Any signs of dehydration?" (e.g., dry mouth [not only dry lips], too weak to stand) "When did you last urinate?"    Last urinated "early this morning"; not voiding like he normally does 9. OTHER  SYMPTOMS: "Do you have any other symptoms?" (e.g., fever, headache, vertigo, vomiting blood or coffee grounds, recent head injury)    weakness 10. PREGNANCY: "Is there any chance you are pregnant?" "When was your last menstrual period?"       n/a  Protocols used: [facility]

## 2020-08-01 NOTE — Telephone Encounter (Signed)
Patient arrived at ED at 1:42.  At 2:10 office received a call from the [facility] stating that the patient and his wife were calling from the ED as he was instructed by the triage nurse to go there.  Wife is upset because they are still in the waiting room and she wants to have an EKG done on her husband and they have not done that.  PEC was instructed that Fabio Bering was in a room seeing a patient.  Also advised that the provider's here do not order for the ER.  PEC was asked to take a message and we would let Fabio Bering know that she had called.

## 2020-08-01 NOTE — Telephone Encounter (Signed)
I'm sorry, it may be still related to the trulicity. If it was within a week that he took the 0.75 he probably still has some old medication in his system. It is unfortunately true that I cannot order an EKG in the ER. He may need some fluids that if the nausea medicine isn't working to help him eat/drink.

## 2020-08-02 ENCOUNTER — Telehealth: Payer: Self-pay | Admitting: *Deleted

## 2020-08-02 NOTE — Telephone Encounter (Signed)
Transition Care Management Unsuccessful Follow-up Telephone Call  Date of discharge and from where:  08/01/2020 [facility] ED  Attempts:  1st Attempt  Reason for unsuccessful TCM follow-up call:  Voice mail full

## 2020-08-05 NOTE — Telephone Encounter (Signed)
Transition Care Management Follow-up Telephone Call  Date of discharge and from where: 08/01/2020 [facility] ED  How have you been since you were released from the hospital? "Doing better"  Any questions or concerns? No  Items Reviewed:  Did the pt receive and understand the discharge instructions provided? Yes   Medications obtained and verified? Yes   Other? No   Any new allergies since your discharge? No   Dietary orders reviewed? Yes  Do you have support at home? Yes   Home Care and Equipment/Supplies: Were home health services ordered? not applicable If so, what is the name of the agency? N/A  Has the agency set up a time to come to the patient's home? not applicable Were any new equipment or medical supplies ordered?  No What is the name of the medical supply agency? N/A Were you able to get the supplies/equipment? not applicable Do you have any questions related to the use of the equipment or supplies? No  Functional Questionnaire: (I = Independent and D = Dependent) ADLs: I  Bathing/Dressing- I  Meal Prep- I  Eating- I  Maintaining continence- I  Transferring/Ambulation- I  Managing Meds- I  Follow up appointments reviewed:   PCP Hospital f/u appt confirmed? No  - has routine appointment in May.  [facility] f/u appt confirmed? No    Are transportation arrangements needed? No   If their condition worsens, is the pt aware to call PCP or go to the Emergency Dept.? Yes  Was the patient provided with contact information for the PCP's office or ED? Yes  Was to pt encouraged to call back with questions or concerns? Yes

## 2020-08-12 ENCOUNTER — Encounter: Payer: Self-pay | Admitting: *Deleted

## 2020-08-16 DIAGNOSIS — M62838 Other muscle spasm: Secondary | ICD-10-CM | POA: Diagnosis not present

## 2020-08-16 DIAGNOSIS — M25512 Pain in left shoulder: Secondary | ICD-10-CM | POA: Diagnosis not present

## 2020-08-16 DIAGNOSIS — M50322 Other cervical disc degeneration at C5-C6 level: Secondary | ICD-10-CM | POA: Diagnosis not present

## 2020-08-16 DIAGNOSIS — M542 Cervicalgia: Secondary | ICD-10-CM | POA: Diagnosis not present

## 2020-08-30 ENCOUNTER — Other Ambulatory Visit: Payer: Self-pay | Admitting: Physician Assistant

## 2020-08-30 DIAGNOSIS — E1169 Type 2 diabetes mellitus with other specified complication: Secondary | ICD-10-CM

## 2020-08-30 DIAGNOSIS — E1142 Type 2 diabetes mellitus with diabetic polyneuropathy: Secondary | ICD-10-CM

## 2020-09-15 NOTE — Progress Notes (Signed)
Office Visit    Patient Name: [name] [name] Date of Encounter: 09/15/2020  PCP:  Pollak, Adriana M, [place]  Cardiologist:  Nelva Bush, MD  Advanced Practice Provider:  Arvil Chaco, PA-C Electrophysiologist:  None  :[phone]}   Chief Complaint    Chief Complaint  Patient presents with  . Follow-up    Annual follow up. Medications verbally reviewed with patient.    45 y.o. male with history of CAD s/p BMS to the LAD (2012), hypertension, hyperlipidemia, DM2, obesity, ADHD, and polysubstance abuse who is being seen today for 65-month follow-up.  Past Medical History    Past Medical History:  Diagnosis Date  . ADHD (attention deficit hyperactivity disorder)   . Allergy   . Arthritis   . CAD (coronary artery disease)    S/P cath May 2012 with BMS to the LAD following abnormal stress test  . Diabetes mellitus   . Drug abuse ([place])    history of marijuana use and alcohol  . GERD (gastroesophageal reflux disease)   . Hypercholesterolemia   . Hypertension   . Myocardial infarction ([place])   . Obesity   . Oxygen deficiency   . Sleep apnea    Past Surgical History:  Procedure Laterality Date  . ABSCESS DRAINAGE     right arm; approx 2003  . CARDIAC CATHETERIZATION    . CORONARY ANGIOPLASTY    . CORONARY STENT PLACEMENT  May 2012   LAD    Allergies  Allergies  Allergen Reactions  . Penicillins     History of Present Illness    [name] is a 45 y.o. male with PMH as above and including chest pain, CAD s/p BMS to LAD (2012), and polysubstance abuse.    He has history of CAD s/p BMS to LAD in 2012.  10/14/2010 cath showed pLAD 95%s s/p PCI/BMS, small D1 with 99%s, LCx with mild luminal irregularities, LVEF 60%.   ETT 01/2018 ruled low risk but demonstrated a hypertensive response. He was seen 09/2018 via telemedicine and noted occasional soreness in his shoulders with increased exercise (reported at previous  visits and thought to be MSK in etiology).   Seen 05/11/2019 with left arm numbness reported 12/1 but no recent left shoulder pain, described at previous visit.  Left arm numbness was present when he woke on 12/1 and lasted throughout the day with subsequent resolution without recurrence.  No sx similar to 2012 cath, at which time he experienced burning across his upper chest and shortness of breath/dyspnea.  No concern CP/SOB with working out.  He had stopped drinking alcohol with improved BP.  He was making healthy lifestyle changes.  He reported a desire to quit marijuana, which he was smoking 3-4 times daily. He had noticed BRBPR with colonoscopy recommended but deferred.  He had occasional / ongoing LUQ cramping associated with type muscles and weight gain from 275 x 2 to 80 pounds, attributed to increased caloric intake.  Seen by his primary cardiologist, Dr. Saunders Revel, on 09/14/2019.  At that time, he reported return of L shoulder pain though less frequent than in the past and usually associated with weightlifting.  Home BP typically 130/80 with MD recommendation for ACE/ARB at RTC if BP still elevated above goal at that time.   He did note that, while not adding salt to his food, he is usual diet/consumed foods contained quite a bit of sodium.  Low-sodium diet encouraged.  He had occasional joint  pain in wrist and elbows and wondered if due to Metformin.  He attributed some of his weight gain to dietary indiscretion during COVID-19 pandemic and the holidays.  He was not using his CPAP regularly with recommendation for restart.  Recently elevated triglycerides on labs noted, as well as recommendation that if triglycerides elevated after lifestyle modification, addition of Vascepa should be considered in future.    Today, 09/16/2020, he returns to clinic and notes he is doing well from a cardiac standpoint.  He denies any chest pain.  He denies any chest pain or shortness of breath/anginal symptoms with cardio or  weightlifting.  No further left arm numbness as described at previous visits.  No tachypalpitations.  No presyncope or syncope.  No recent falls.  No signs or symptoms of volume overload.  He reports that he has overall noticed improvement and his energy and help since stopping alcohol completely.  He reports ongoing marijuana use but no desire to quit, as noted at previous visits.  He continues to not add salt to food with baseline we will likely try and salt.  He does not tolerate his CPAP well with options for CPAP masks reviewed today in detail.  BP 130/88 with goal BP discussed is 130/80 or lower.  He reports medication compliance.  No recent signs or symptoms of bleeding.  He reports his colonoscopy is upcoming.  Home Medications    Current Outpatient Medications on File Prior to Visit  Medication Sig Dispense Refill  . ACCU-CHEK GUIDE test strip USE TO CHECK SUGARS EVERY MORNING 100 strip 1  . amLODipine (NORVASC) 10 MG tablet TAKE 1 TABLET(10 MG) BY MOUTH DAILY 90 tablet 1  . aspirin 81 MG tablet Take 81 mg by mouth daily.    . cyclobenzaprine (FLEXERIL) 5 MG tablet Take 1 tablet (5 mg total) by mouth 2 (two) times daily as needed for muscle spasms. 20 tablet 0  . dicyclomine (BENTYL) 20 MG tablet Take 1 tablet (20 mg total) by mouth 4 (four) times daily -  before meals and at bedtime for 5 days. 20 tablet 0  . Dulaglutide (TRULICITY) 9.67 EL/3.8BO SOPN Inject 0.75 mg into the skin once a week. 2 mL 2  . ezetimibe (ZETIA) 10 MG tablet TAKE 1 TABLET(10 MG) BY MOUTH DAILY 90 tablet 2  . Insulin Pen Needle (NOVOFINE) 32G X 6 MM MISC Use once daily to inject victoza. 100 each 1  . metoprolol succinate (TOPROL-XL) 25 MG 24 hr tablet TAKE 1 TABLET(25 MG) BY MOUTH AT BEDTIME 90 tablet 0  . nitroGLYCERIN (NITROSTAT) 0.4 MG SL tablet PLACE 1 TABLET UNDER THE TONGUE EVERY 5 MINUTES AS NEEDED FOR CHEST PAIN 25 tablet 4  . ondansetron (ZOFRAN) 4 MG tablet Take 1 tablet (4 mg total) by mouth every 8  (eight) hours as needed for nausea or vomiting. 20 tablet 0  . pantoprazole (PROTONIX) 40 MG tablet TAKE 1 TABLET(40 MG) BY MOUTH DAILY 90 tablet 1  . promethazine (PHENERGAN) 25 MG tablet Take 1 tablet (25 mg total) by mouth every 6 (six) hours as needed for nausea or vomiting. 15 tablet 0  . QUEtiapine (SEROQUEL XR) 50 MG TB24 24 hr tablet Take 1 tablet (50 mg total) by mouth at bedtime. 30 tablet 2  . rosuvastatin (CRESTOR) 40 MG tablet TAKE 1 TABLET(40 MG) BY MOUTH DAILY 90 tablet 0   No current facility-administered medications on file prior to visit.    Review of Systems    He denies  chest pain, palpitations, dyspnea, pnd, orthopnea, n, v, dizziness, syncope, edema, weight gain, or early satiety.  He reports improved energy and vitals since quitting alcohol with a desire to quit marijuana.   All other systems reviewed and are otherwise negative except as noted above.  Physical Exam    VS:  BP 130/88 (BP Location: Left Arm, Patient Position: Sitting, Cuff Size: Normal)   Pulse (!) 56   Ht 6\' 3"  (1.905 m)   Wt 268 lb (121.6 kg)   SpO2 98%   BMI 33.50 kg/m  , BMI Body mass index is 33.5 kg/m. GEN: Well nourished, well developed, in no acute distress.  Facemask in place. HEENT: normal. Neck: Supple.  JVD difficult to assess due to body habitus.  No carotid bruits or masses. Cardiac: Bradycardic but regular No murmurs, rubs, or gallops. No clubbing, cyanosis, edema.  Radials/DP/PT 2+ and equal bilaterally.  Respiratory:  Respirations regular and unlabored, clear to auscultation bilaterally. GI: Soft, nontender, nondistended, BS + x 4. MS: no deformity or atrophy. Skin: warm and dry, no rash. Neuro:  Strength and sensation are intact. Psych: Normal affect.  Accessory Clinical Findings    ECG personally reviewed by me today - SB, 56 bpm, first degree AVB - no acute changes from prior tracings.  VITALS Reviewed today   Temp Readings from Last 3 Encounters:  08/01/20 97.7 F  (36.5 C) (Oral)  07/30/20 98.6 F (37 C) (Oral)  07/24/20 98 F (36.7 C)   BP Readings from Last 3 Encounters:  08/01/20 118/80  07/30/20 121/82  07/24/20 (!) 152/99   Pulse Readings from Last 3 Encounters:  08/01/20 67  07/30/20 75  07/24/20 91    Wt Readings from Last 3 Encounters:  08/01/20 270 lb (122.5 kg)  07/30/20 276 lb 14.4 oz (125.6 kg)  07/24/20 271 lb (122.9 kg)     LABS  reviewed today   Lab Results  Component Value Date   WBC 7.1 08/01/2020   HGB 15.7 08/01/2020   HCT 44.8 08/01/2020   MCV 90.3 08/01/2020   PLT 281 08/01/2020   Lab Results  Component Value Date   CREATININE 0.80 08/01/2020   BUN 10 08/01/2020   NA 138 08/01/2020   K 3.9 08/01/2020   CL 108 08/01/2020   CO2 20 (L) 08/01/2020   Lab Results  Component Value Date   ALT 33 08/01/2020   AST 19 08/01/2020   ALKPHOS 53 08/01/2020   BILITOT 0.8 08/01/2020   Lab Results  Component Value Date   CHOL 175 07/08/2020   HDL 41 07/08/2020   LDLCALC 97 07/08/2020   LDLDIRECT 129 (H) 06/24/2018   TRIG 217 (H) 07/08/2020   CHOLHDL 4.3 07/08/2020    Lab Results  Component Value Date   HGBA1C 8.1 (H) 07/08/2020   Lab Results  Component Value Date   TSH 0.566 04/11/2018     STUDIES/PROCEDURES reviewed today   Cardiovascular History & Procedures: Cardiovascular Problems:  Known coronary artery disease status post PCI to the LAD (10/2010)  Risk Factors:  Known CAD, hypertension, hyperlipidemia, diabetes mellitus, obesity, and male gender  Cath/PCI:  LHC/PCI (10/14/2010): LMCA normal. LAD with 95% proximal stenosis. Small D1 with 99% proximal stenosis. LCx with mild luminal irregularities. RCA without significant disease. LVEF 60%. Successful albeit challenging PCI to the proximal LAD (difficult to find proper guide catheter) with placement of a 4.0 x 24 mm Veriflex bare-metal stent.  CV Surgery:  None  EP Procedures and Devices:  None  Non-Invasive  Evaluation(s):  Pharmacologic MPI (10/13/2010): Stress only images obtained, demonstrating decreased uptake in the anterior wall suggestive of ischemia   Assessment & Plan    Coronary artery disease s/p BMS to LAD (2012) --No recent angina or symptoms to suggest worsening coronary insufficiency. Continue current medications for secondary prevention including ASA 81 mg and simvastatin.    Continue Toprol 25 mg daily and amlodipine 10 mg daily.  Initiation of losartan 25 mg daily today as below.  Continue as needed sublingual nitro as needed for chest pain.  No indication for further ischemic workup at this time. Continue ongoing lifestyle changes, including dietary changes discussed and ongoing activity as tolerated.  Discussed cessation of marijuana and congratulated on ongoing cessation of EtOH.  Essential hypertension, BP goal <130/80 --Office BP 130/88 and above goal, as seen on previous visits.  Given ongoing elevated BP, recommendation was for start of losartan 25 mg daily with BMET today and in 1 to 2 weeks.  Continue amlodipine 10 mg and metoprolol with close monitoring of first-degree AV block, stable on most recent EKGs.  Reviewed recommendations for total daily fluid under 2 L and sodium 1 to 2 g daily.  Recommend DASH diet.  Recommend monitoring of BP at home.   If BP consistently still elevated at 130/80 or higher, can she call the office.     Hyperlipidemia, LDL goal <70 Hypertriglyceridemia --Recent labs from 06/2020 show total cholesterol 175, triglycerides 217, LDL 97.  Discussed that LDL is not at goal of below 70 and triglycerides are elevated above 150 with recommendation for optimized medical management and LDL/Tg control for aggressive risk factor modification.  Given he is currently on Crestor 40 mg daily and Zetia 10 mg daily.  We discussed initiation of PCSK9 inhibitors +/-Vascepa or bempedoic acid versus Inclisiran with patient preference to defer and still attempt lifestyle  changes. Risks of ongoing uncontrolled LDL and Tg reviewed with pt understanding.  Recommend ongoing healthy diet and increase activity as tolerated and revisit medication changes at RTC, if not sooner.  Continue rosuvastatin 40 mg and Zetia 10 mg.  Recheck lipids and liver at RTC.  If LDL and triglycerides are still elevated, revisit Repatha/Praluent or Inclisiran at that time.   History of  GERD/ prior history of hematochezia --Follows with GI with pt report of upcoming colonoscopy.  Continue PPI with Protonix 40 mg daily and GI medications as prescribed.  Prior history of tobacco and alcohol use Current marijuana use --He quit drinking alcohol and continues to not smoke tobacco but does use marijuana. Indicated desire to quit smoking marijuana with encouragement and recommendation for cessation discussed today.  Type II diabetes mellitus  --07/08/2020 labs showing A1C 8.1, poorly controlled.  Strict glycemic control recommended for risk factor modification.  Recommend consider Vascepa for elevated triglycerides as above.  Discussed diet with patient understanding.  Recommend initiation of losartan 25 mg daily given comorbid cocaine as well.  Continue treatment and follow-up with PCP.  Obstructive sleep apnea --Reviewed recommendation for use of CPAP regularly.  Reviewed risks of untreated sleep apnea on overall cardiac health.  He is aware of the association between untreated sleep apnea and weight gain/hypertension.  Reviewed association of untreated OSA and atrial fibrillation, as well as the overall influence on health.  He reports difficulty tolerating fullface CPAP mask.  Alternatives to fullface CPAP reviewed today with patient understanding.  He will look into obtaining the nasal pillow CPAP mask +/- chin strap over that  of a full face mask.  We discussed that, if repeat sleep apnea study is needed to obtain insurance coverage nasal pillow CPAP mask and chinstrap, watch pat study was  available with patient understanding.  Reassess CPAP compliance/data nasal pillow mask and chinstrap obtain at RTC.  Disposition: Follow-up in 6 months or sooner if indicated.  Medication changes: Start losartan 25 mg daily Labs ordered: BMET today, 1 week Studies / Imaging ordered: None Future considerations: Repeat lipid and liver function with medication changes to include PCSK9 inhibitors versus Inclisiran +/- Vascepa, bempedoic acid if LDL/triglycerides not at goal.  Up titration of losartan if indicated.  Continue to monitor first-degree AV block on both beta-blocker and calcium channel blocker.  Reassess OSA and CPAP compliance/if repeat study needed to obtain nasal pillow mask with chinstrap. Disposition: RTC 1 year or sooner if needed  *Please be aware that the above documentation was completed voice recognition software and may contain associated dictation errors.     Arvil Chaco, PA-C 09/15/2020

## 2020-09-16 ENCOUNTER — Encounter: Payer: Self-pay | Admitting: Physician Assistant

## 2020-09-16 ENCOUNTER — Other Ambulatory Visit: Payer: Self-pay

## 2020-09-16 ENCOUNTER — Ambulatory Visit (INDEPENDENT_AMBULATORY_CARE_PROVIDER_SITE_OTHER): Admitting: Physician Assistant

## 2020-09-16 VITALS — BP 130/88 | HR 56 | Ht 75.0 in | Wt 268.0 lb

## 2020-09-16 DIAGNOSIS — I2510 Atherosclerotic heart disease of native coronary artery without angina pectoris: Secondary | ICD-10-CM

## 2020-09-16 DIAGNOSIS — E1165 Type 2 diabetes mellitus with hyperglycemia: Secondary | ICD-10-CM

## 2020-09-16 DIAGNOSIS — E119 Type 2 diabetes mellitus without complications: Secondary | ICD-10-CM

## 2020-09-16 DIAGNOSIS — I10 Essential (primary) hypertension: Secondary | ICD-10-CM

## 2020-09-16 DIAGNOSIS — E785 Hyperlipidemia, unspecified: Secondary | ICD-10-CM | POA: Diagnosis not present

## 2020-09-16 DIAGNOSIS — Z87891 Personal history of nicotine dependence: Secondary | ICD-10-CM

## 2020-09-16 DIAGNOSIS — K219 Gastro-esophageal reflux disease without esophagitis: Secondary | ICD-10-CM

## 2020-09-16 DIAGNOSIS — F1290 Cannabis use, unspecified, uncomplicated: Secondary | ICD-10-CM

## 2020-09-16 DIAGNOSIS — Z79899 Other long term (current) drug therapy: Secondary | ICD-10-CM

## 2020-09-16 DIAGNOSIS — G4733 Obstructive sleep apnea (adult) (pediatric): Secondary | ICD-10-CM

## 2020-09-16 DIAGNOSIS — E781 Pure hyperglyceridemia: Secondary | ICD-10-CM

## 2020-09-16 MED ORDER — LOSARTAN POTASSIUM 25 MG PO TABS: 25.0000 mg | ORAL_TABLET | Freq: Every day | ORAL | 3 refills | Status: DC

## 2020-09-16 NOTE — Patient Instructions (Addendum)
Medication Instructions:  Your physician has recommended you make the following change in your medication:   START Losartan 25mg  daily  *If you need a refill on your cardiac medications before your next appointment, please call your pharmacy*   Lab Work:  Your physician recommends that you return for lab work to the [facility] in: 2 weeks (BMET) -  Please go to the [facility]. You will check in at the front desk to the right as you walk into the atrium. Valet Parking is offered if needed. - No appointment needed. You may go any day between 7 am and 6 pm.   If you have labs (blood work) drawn today and your tests are completely normal, you will receive your results only by: [name] MyChart Message (if you have MyChart) OR . A paper copy in the mail If you have any lab test that is abnormal or we need to change your treatment, we will call you to review the results.   Testing/Procedures: None ordered   Follow-Up: At [facility], you and your health needs are our priority.  As part of our continuing mission to provide you with exceptional heart care, we have created designated Provider Care Teams.  These Care Teams include your primary Cardiologist (physician) and Advanced Practice Providers (APPs -  Physician Assistants and Nurse Practitioners) who all work together to provide you with the care you need, when you need it.  We recommend signing up for the patient portal called "MyChart".  Sign up information is provided on this After Visit Summary.  MyChart is used to connect with patients for Virtual Visits (Telemedicine).  Patients are able to view lab/test results, encounter notes, upcoming appointments, etc.  Non-urgent messages can be sent to your provider as well.   To learn more about what you can do with MyChart, go to [email or web address].    Your next appointment:   1 year(s)  The format for your next appointment:   In Person  Provider:   You may see Nelva Bush, MD or one of the following Advanced Practice Providers on your designated Care Team:    Murray Hodgkins, NP  Christell Faith, PA-C  Marrianne Mood, PA-C  Cadence Amanda Park, [place]  Laurann Montana, NP    Other Instructions  Think about the Watch [place], if your insurance company wants another sleep study to adjust your CPAP mask.  [email or web address]

## 2020-09-25 ENCOUNTER — Other Ambulatory Visit: Payer: Self-pay

## 2020-09-25 ENCOUNTER — Encounter: Payer: Self-pay | Admitting: Gastroenterology

## 2020-09-25 ENCOUNTER — Ambulatory Visit (INDEPENDENT_AMBULATORY_CARE_PROVIDER_SITE_OTHER): Admitting: Gastroenterology

## 2020-09-25 ENCOUNTER — Ambulatory Visit: Payer: Self-pay | Admitting: Physician Assistant

## 2020-09-25 VITALS — BP 127/86 | HR 75 | Ht 75.0 in | Wt 269.6 lb

## 2020-09-25 DIAGNOSIS — K625 Hemorrhage of anus and rectum: Secondary | ICD-10-CM | POA: Insufficient documentation

## 2020-09-25 MED ORDER — NA SULFATE-K SULFATE-MG SULF 17.5-3.13-1.6 GM/177ML PO SOLN: 1.0000 | Freq: Once | ORAL | 0 refills | Status: AC

## 2020-09-25 NOTE — Progress Notes (Signed)
Jonathon Bellows MD, MRCP(U.K) [address]  [place]  [place], [place] [address]  Main: [phone]  Fax: [phone]   Gastroenterology Consultation  Referring Provider:     Paulene Floor Primary Care Physician:  Paulene Floor Primary Gastroenterologist:  Dr. Jonathon Bellows  Reason for Consultation:     Rectal bleeding        HPI:   [name] [name] is a 45 y.o. y/o male referred for consultation & management  by [name], PA-C.     I had previously seen him for rectal bleeding back in 2019 which has been going on for at least 3 years prior to that.  The plan was for colonoscopy after his cardiac work-up which is in the middle of he was later cleared for the procedure but unfortunately canceled the procedure on 2 occasions.  Continues to have blood on the tissue paper in the toilet bowl on and off since last visit.  States his stool is hard at times.  Diet contains fiber but probably not sufficient.  Abdominal discomfort at times with CAT scan a few months back which showed no gross abnormalities.  Possible foraminal stenosis at L5-S1.  Past Medical History:  Diagnosis Date  . ADHD (attention deficit hyperactivity disorder)   . Allergy   . Arthritis   . CAD (coronary artery disease)    S/P cath May 2012 with BMS to the LAD following abnormal stress test  . Diabetes mellitus   . Drug abuse ([place])    history of marijuana use and alcohol  . GERD (gastroesophageal reflux disease)   . Hypercholesterolemia   . Hypertension   . Myocardial infarction ([place])   . Obesity   . Oxygen deficiency   . Sleep apnea     Past Surgical History:  Procedure Laterality Date  . ABSCESS DRAINAGE     right arm; approx 2003  . CARDIAC CATHETERIZATION    . CORONARY ANGIOPLASTY    . CORONARY STENT PLACEMENT  May 2012   LAD    Prior to Admission medications   Medication Sig Start Date End Date Taking? Authorizing Provider  ACCU-CHEK GUIDE test strip USE TO CHECK  SUGARS EVERY MORNING 04/05/20   [name] M, PA-C  amLODipine (NORVASC) 10 MG tablet TAKE 1 TABLET(10 MG) BY MOUTH DAILY 04/25/20   [name], PA-C  aspirin 81 MG tablet Take 81 mg by mouth daily.    [provider]  cyclobenzaprine (FLEXERIL) 5 MG tablet Take 1 tablet (5 mg total) by mouth 2 (two) times daily as needed for muscle spasms. 05/13/18   [name], PA-C  dicyclomine (BENTYL) 20 MG tablet Take 1 tablet (20 mg total) by mouth 4 (four) times daily -  before meals and at bedtime for 5 days. 08/01/20 08/06/20  Duffy Bruce, MD  ezetimibe (ZETIA) 10 MG tablet TAKE 1 TABLET(10 MG) BY MOUTH DAILY 11/20/19   Rise Mu, PA-C  gabapentin (NEURONTIN) 300 MG capsule Take by mouth. 08/16/20   [provider]  Insulin Pen Needle (NOVOFINE) 32G X 6 MM MISC Use once daily to inject victoza. 10/13/19   [name], PA-C  losartan (COZAAR) 25 MG tablet Take 1 tablet (25 mg total) by mouth daily. 09/16/20 09/11/21  Marrianne Mood D, PA-C  metFORMIN (GLUCOPHAGE) 1000 MG tablet Take 1,000 mg by mouth 2 (two) times daily with a meal.    [provider]  metoprolol succinate (TOPROL-XL) 25 MG 24  hr tablet TAKE 1 TABLET(25 MG) BY MOUTH AT BEDTIME 04/25/20   [name], PA-C  nitroGLYCERIN (NITROSTAT) 0.4 MG SL tablet PLACE 1 TABLET UNDER THE TONGUE EVERY 5 MINUTES AS NEEDED FOR CHEST PAIN 07/19/20   [name], PA-C  pantoprazole (PROTONIX) 40 MG tablet TAKE 1 TABLET(40 MG) BY MOUTH DAILY 03/14/20   [name] M, PA-C  QUEtiapine (SEROQUEL XR) 50 MG TB24 24 hr tablet Take 1 tablet (50 mg total) by mouth at bedtime. 06/22/19 09/20/19  [name], PA-C  rosuvastatin (CRESTOR) 40 MG tablet TAKE 1 TABLET(40 MG) BY MOUTH DAILY 07/29/20   [name], PA-C    Family History  Problem Relation Age of Onset  . Hypertension Mother   . Diabetes Mother   . Hypertension Father   . Diabetes Father   . Heart disease Father        cabg 3;  1995; 18 stents  . Hypertension Sister   . Heart disease Paternal Uncle   . Heart disease Paternal Grandmother      Social History   Tobacco Use  . Smoking status: Former Smoker    Packs/day: 1.00    Years: 14.00    Pack years: 14.00    Types: Cigarettes    Quit date: 06/2017    Years since quitting: 3.3  . Smokeless tobacco: Never Used  Vaping Use  . Vaping Use: Never used  Substance Use Topics  . Alcohol use: Yes    Alcohol/week: 10.0 standard drinks    Types: 2 Cans of beer, 8 Shots of liquor per week  . Drug use: Yes    Frequency: 20.0 times per week    Types: Marijuana    Allergies as of 09/25/2020 - Review Complete 09/16/2020  Allergen Reaction Noted  . Penicillins  10/01/2010    Review of Systems:    All systems reviewed and negative except where noted in HPI.   Physical Exam:  There were no vitals taken for this visit. No LMP for male patient. Psych:  Alert and cooperative. Normal mood and affect. General:   Alert,  Well-developed, well-nourished, pleasant and cooperative in NAD Head:  Normocephalic and atraumatic. Eyes:  Sclera clear, no icterus.   Conjunctiva pink. Lungs:  Respirations even and unlabored.  Clear throughout to auscultation.   No wheezes, crackles, or rhonchi. No acute distress. Heart:  Regular rate and rhythm; no murmurs, clicks, rubs, or gallops. Abdomen:  Normal bowel sounds.  No bruits.  Soft, non-tender and non-distended without masses, hepatosplenomegaly or hernias noted.  No guarding or rebound tenderness.    Neurologic:  Alert and oriented x3;  grossly normal neurologically. Psych:  Alert and cooperative. Normal mood and affect.  Imaging Studies: No results found.  Assessment and Plan:   [name] [name] is a 45 y.o. y/o male has been referred for rectal bleeding longstanding for many years.  Hemoglobin 15.7 g.  He has coronary artery disease follows with cardiology has had LAD stenosis with stenting.  Very likely secondary to  internal hemorrhoids.  Previously recommended a colonoscopy but was canceled on a few occasions.  Plan 1.  Colonoscopy after cardiac clearance 2.  Patient information provided on high-fiber diet. 3.  Counseled about internal hemorrhoids and conservative management of internal hemorrhoids.  If it does not help then we will proceed with banding of hemorrhoids at his following visit   I have discussed alternative options, risks & benefits,  which include, but are not limited to, bleeding,  infection, perforation,respiratory complication & drug reaction.  The patient agrees with this plan & written consent will be obtained.     Follow up in 3 months  Dr Jonathon Bellows MD,MRCP(U.K)

## 2020-09-25 NOTE — Progress Notes (Signed)
Procedure has been scheduled and instructions will be mailed. Bowel prep sent to the pharmacy.

## 2020-09-25 NOTE — Patient Instructions (Signed)
Hemorrhoids Hemorrhoids are swollen veins that may develop:  In the butt (rectum). These are called internal hemorrhoids.  Around the opening of the butt (anus). These are called external hemorrhoids. Hemorrhoids can cause pain, itching, or bleeding. Most of the time, they do not cause serious problems. They usually get better with diet changes, lifestyle changes, and other home treatments. What are the causes? This condition may be caused by:  Having trouble pooping (constipation).  Pushing hard (straining) to poop.  Watery poop (diarrhea).  Pregnancy.  Being very overweight (obese).  Sitting for long periods of time.  Heavy lifting or other activity that causes you to strain.  Anal sex.  Riding a bike for a long period of time. What are the signs or symptoms? Symptoms of this condition include:  Pain.  Itching or soreness in the butt.  Bleeding from the butt.  Leaking poop.  Swelling in the area.  One or more lumps around the opening of your butt. How is this diagnosed? A doctor can often diagnose this condition by looking at the affected area. The doctor may also:  Do an exam that involves feeling the area with a gloved hand (digital rectal exam).  Examine the area inside your butt using a small tube (anoscope).  Order blood tests. This may be done if you have lost a lot of blood.  Have you get a test that involves looking inside the colon using a flexible tube with a camera on the end (sigmoidoscopy or colonoscopy). How is this treated? This condition can usually be treated at home. Your doctor may tell you to change what you eat, make lifestyle changes, or try home treatments. If these do not help, procedures can be done to remove the hemorrhoids or make them smaller. These may involve:  Placing rubber bands at the base of the hemorrhoids to cut off their blood supply.  Injecting medicine into the hemorrhoids to shrink them.  Shining a type of light  energy onto the hemorrhoids to cause them to fall off.  Doing surgery to remove the hemorrhoids or cut off their blood supply. Follow these instructions at home: Eating and drinking  Eat foods that have a lot of fiber in them. These include whole grains, beans, nuts, fruits, and vegetables.  Ask your doctor about taking products that have added fiber (fibersupplements).  Reduce the amount of fat in your diet. You can do this by: ? Eating low-fat dairy products. ? Eating less red meat. ? Avoiding processed foods.  Drink enough fluid to keep your pee (urine) pale yellow.   Managing pain and swelling  Take a warm-water bath (sitz bath) for 20 minutes to ease pain. Do this 3-4 times a day. You may do this in a bathtub or using a portable sitz bath that fits over the toilet.  If told, put ice on the painful area. It may be helpful to use ice between your warm baths. ? Put ice in a plastic bag. ? Place a towel between your skin and the bag. ? Leave the ice on for 20 minutes, 2-3 times a day.   General instructions  Take over-the-counter and prescription medicines only as told by your doctor. ? Medicated creams and medicines may be used as told.  Exercise often. Ask your doctor how much and what kind of exercise is best for you.  Go to the bathroom when you have the urge to poop. Do not wait.  Avoid pushing too hard when you poop.  Keep your butt dry and clean. Use wet toilet paper or moist towelettes after pooping.  Do not sit on the toilet for a long time.  Keep all follow-up visits as told by your doctor. This is important. Contact a doctor if you:  Have pain and swelling that do not get better with treatment or medicine.  Have trouble pooping.  Cannot poop.  Have pain or swelling outside the area of the hemorrhoids. Get help right away if you have:  Bleeding that will not stop. Summary  Hemorrhoids are swollen veins in the butt or around the opening of the  butt.  They can cause pain, itching, or bleeding.  Eat foods that have a lot of fiber in them. These include whole grains, beans, nuts, fruits, and vegetables.  Take a warm-water bath (sitz bath) for 20 minutes to ease pain. Do this 3-4 times a day. This information is not intended to replace advice given to you by your health care provider. Make sure you discuss any questions you have with your health care provider. Document Revised: 06/02/2018 Document Reviewed: 10/14/2017 Elsevier Patient Education  [place].

## 2020-09-27 ENCOUNTER — Telehealth: Payer: Self-pay

## 2020-09-27 NOTE — Telephone Encounter (Signed)
If [name] [name] is not having any new cardiac symptoms, I think it is reasonable to hold aspirin for 5 days before the procedure and restart it when it is felt safe to do so by his gastroenterologist.

## 2020-09-27 NOTE — Telephone Encounter (Signed)
    [name] [name] DOB:  13-Jun-1975  MRN:  [id number]   Primary Cardiologist: Nelva Bush, MD  Chart reviewed as part of pre-operative protocol coverage.   Patient has a history of CAD s/p BMS to RCA in 2012 with last ischemic testing being an ETT in 2019 which was negative for ischemia.  Dr. Saunders Revel - can you comment on recommendations for holding aspirin prior to upcoming colonoscopy? Appears he has been having rectal bleeding.  Please route your response back to P CV DIV PREOP. Thanks!  Abigail Butts, PA-C 09/27/2020, 3:30 PM

## 2020-09-27 NOTE — Telephone Encounter (Signed)
    [facility] Pre-operative Risk Assessment    Request for surgical clearance:  1. What type of surgery is being performed? colonoscopy   2. When is this surgery scheduled? 10/10/2020   3. Are there any medications that need to be held prior to surgery and how long? Aspirin 81 MG   4. Practice name and name of physician performing surgery? [facility]- Jonathon Bellows, MD   5. What is your office phone and fax number? [phone] Fax: [phone]   6. Anesthesia type (None, local, MAC, general) ? general    E  09/27/2020, 1:30 PM  _________________________________________________________________   (provider comments below)

## 2020-09-30 NOTE — Telephone Encounter (Signed)
   Name: [name] [name]  DOB: 08/27/1975  MRN: [id number]   Primary Cardiologist: Nelva Bush, MD  Chart reviewed as part of pre-operative protocol coverage. Patient was contacted 09/30/2020 in reference to pre-operative risk assessment for pending surgery as outlined below.  Deke Tilghman was last seen on 09/16/2020 by Marrianne Mood PA-C.  Since that day, Tabor Denham has done well without exertional chest pain or worsening dyspnea.  Therefore, based on ACC/AHA guidelines, the patient would be at acceptable risk for the planned procedure without further cardiovascular testing.   Patient may hold aspirin for 5 days prior to the procedure and restart as soon as possible afterward at the surgeon's discretion.  The patient was advised that if he develops new symptoms prior to surgery to contact our office to arrange for a follow-up visit, and he verbalized understanding.  I will route this recommendation to the requesting party via Epic fax function and remove from pre-op pool. Please call with questions.  [place], [place] 09/30/2020, 4:30 PM

## 2020-10-01 NOTE — Telephone Encounter (Signed)
Clearance has been received and patient was notified by Almyra Deforest.

## 2020-10-10 ENCOUNTER — Encounter
Admission: RE | Disposition: A | Discharge status: Home / Self Care | Payer: Self-pay | Source: Home / Self Care | Attending: Gastroenterology

## 2020-10-10 ENCOUNTER — Ambulatory Visit
Admission: RE | Admit: 2020-10-10 | Discharge: 2020-10-10 | Disposition: A | Discharge status: Home / Self Care | Attending: Gastroenterology | Admitting: Gastroenterology

## 2020-10-10 ENCOUNTER — Ambulatory Visit: Admitting: Anesthesiology

## 2020-10-10 ENCOUNTER — Encounter: Payer: Self-pay | Admitting: Gastroenterology

## 2020-10-10 ENCOUNTER — Other Ambulatory Visit: Payer: Self-pay

## 2020-10-10 DIAGNOSIS — K625 Hemorrhage of anus and rectum: Secondary | ICD-10-CM | POA: Insufficient documentation

## 2020-10-10 DIAGNOSIS — Z7982 Long term (current) use of aspirin: Secondary | ICD-10-CM | POA: Diagnosis not present

## 2020-10-10 DIAGNOSIS — K640 First degree hemorrhoids: Secondary | ICD-10-CM | POA: Diagnosis not present

## 2020-10-10 DIAGNOSIS — I252 Old myocardial infarction: Secondary | ICD-10-CM | POA: Diagnosis not present

## 2020-10-10 DIAGNOSIS — Z79899 Other long term (current) drug therapy: Secondary | ICD-10-CM | POA: Diagnosis not present

## 2020-10-10 DIAGNOSIS — E119 Type 2 diabetes mellitus without complications: Secondary | ICD-10-CM | POA: Diagnosis not present

## 2020-10-10 DIAGNOSIS — K635 Polyp of colon: Secondary | ICD-10-CM | POA: Diagnosis not present

## 2020-10-10 DIAGNOSIS — Z6833 Body mass index (BMI) 33.0-33.9, adult: Secondary | ICD-10-CM | POA: Insufficient documentation

## 2020-10-10 DIAGNOSIS — Z880 Allergy status to penicillin: Secondary | ICD-10-CM | POA: Insufficient documentation

## 2020-10-10 DIAGNOSIS — Z955 Presence of coronary angioplasty implant and graft: Secondary | ICD-10-CM | POA: Diagnosis not present

## 2020-10-10 DIAGNOSIS — I10 Essential (primary) hypertension: Secondary | ICD-10-CM | POA: Insufficient documentation

## 2020-10-10 DIAGNOSIS — E7800 Pure hypercholesterolemia, unspecified: Secondary | ICD-10-CM | POA: Insufficient documentation

## 2020-10-10 DIAGNOSIS — Z8249 Family history of ischemic heart disease and other diseases of the circulatory system: Secondary | ICD-10-CM | POA: Insufficient documentation

## 2020-10-10 DIAGNOSIS — E669 Obesity, unspecified: Secondary | ICD-10-CM | POA: Insufficient documentation

## 2020-10-10 DIAGNOSIS — Z833 Family history of diabetes mellitus: Secondary | ICD-10-CM | POA: Insufficient documentation

## 2020-10-10 DIAGNOSIS — Z87891 Personal history of nicotine dependence: Secondary | ICD-10-CM | POA: Diagnosis not present

## 2020-10-10 DIAGNOSIS — D124 Benign neoplasm of descending colon: Secondary | ICD-10-CM | POA: Insufficient documentation

## 2020-10-10 DIAGNOSIS — I2510 Atherosclerotic heart disease of native coronary artery without angina pectoris: Secondary | ICD-10-CM | POA: Insufficient documentation

## 2020-10-10 HISTORY — PX: COLONOSCOPY WITH PROPOFOL: SHX5780

## 2020-10-10 LAB — GLUCOSE, CAPILLARY: Glucose-Capillary: 142 mg/dL — ABNORMAL HIGH (ref 70–99)

## 2020-10-10 SURGERY — COLONOSCOPY WITH PROPOFOL
Anesthesia: General

## 2020-10-10 MED ORDER — PROPOFOL 10 MG/ML IV BOLUS
INTRAVENOUS | Status: DC | PRN
  Administered 2020-10-10 (×3): 50 mg via INTRAVENOUS
  Administered 2020-10-10: 100 mg via INTRAVENOUS

## 2020-10-10 MED ORDER — DEXMEDETOMIDINE HCL 200 MCG/2ML IV SOLN
INTRAVENOUS | Status: DC | PRN
Start: 2020-10-10 — End: 2020-10-10
  Administered 2020-10-10: 20 ug via INTRAVENOUS

## 2020-10-10 MED ORDER — PROPOFOL 500 MG/50ML IV EMUL
INTRAVENOUS | Status: DC | PRN
  Administered 2020-10-10: 150 ug/kg/min via INTRAVENOUS

## 2020-10-10 MED ORDER — SODIUM CHLORIDE 0.9 % IV SOLN: INTRAVENOUS | Status: DC

## 2020-10-10 NOTE — Op Note (Signed)
[facility] Gastroenterology Patient Name: [name] [name] Procedure Date: 10/10/2020 8:57 AM MRN: [id number] Account #: [id number] Date of Birth: 1975-11-20 Admit Type: Outpatient Age: 45 Room: [facility] ENDO ROOM 3 Gender: Male Note Status: Finalized Procedure:             Colonoscopy Indications:           Rectal bleeding Providers:             Jonathon Bellows MD, MD Referring MD:          Wendee Beavers. Terrilee Croak (Referring MD) Medicines:             Monitored Anesthesia Care Complications:         No immediate complications. Procedure:             Pre-Anesthesia Assessment:                        - Prior to the procedure, a History and Physical was                         performed, and patient medications, allergies and                         sensitivities were reviewed. The patient's tolerance                         of previous anesthesia was reviewed.                        - The risks and benefits of the procedure and the                         sedation options and risks were discussed with the                         patient. All questions were answered and informed                         consent was obtained.                        - ASA Grade Assessment: II - A patient with mild                         systemic disease.                        After obtaining informed consent, the colonoscope was                         passed under direct vision. Throughout the procedure,                         the patient's blood pressure, pulse, and oxygen                         saturations were monitored continuously. The                         Colonoscope was introduced through the anus and  advanced to the the cecum, identified by the                         appendiceal orifice. The colonoscopy was performed                         with ease. The patient tolerated the procedure well.                         The quality of the bowel preparation was  excellent. Findings:      The perianal and digital rectal examinations were normal.      Non-bleeding internal hemorrhoids were found during retroflexion. The       hemorrhoids were large and Grade I (internal hemorrhoids that do not       prolapse).      The entire examined colon appeared normal.      Two sessile polyps were found in the rectum and descending colon. The       polyps were 3 to 4 mm in size. These polyps were removed with a jumbo       cold forceps. Resection and retrieval were complete. Impression:            - Non-bleeding internal hemorrhoids.                        - The entire examined colon is normal.                        - Two 3 to 4 mm polyps in the rectum and in the                         descending colon, removed with a jumbo cold forceps.                         Resected and retrieved. Recommendation:        - Discharge patient to home (with escort).                        - Resume previous diet.                        - Continue present medications.                        - Await pathology results.                        - Repeat colonoscopy for surveillance based on                         pathology results. Procedure Code(s):     --- Professional ---                        [phone], Colonoscopy, flexible; with biopsy, single or                         multiple Diagnosis Code(s):     --- Professional ---  K64.0, First degree hemorrhoids                        K62.1, Rectal polyp                        K63.5, Polyp of colon                        K62.5, Hemorrhage of anus and rectum CPT copyright 2019 American Medical Association. All rights reserved. The codes documented in this report are preliminary and upon coder review may  be revised to meet current compliance requirements. Jonathon Bellows, MD Jonathon Bellows MD, MD 10/10/2020 9:22:42 AM This report has been signed electronically. Number of Addenda: 0 Note Initiated On: 10/10/2020 8:57  AM Scope Withdrawal Time: 0 hours 14 minutes 27 seconds  Total Procedure Duration: 0 hours 17 minutes 13 seconds  Estimated Blood Loss:  Estimated blood loss: none.      [facility]

## 2020-10-10 NOTE — Anesthesia Preprocedure Evaluation (Signed)
Anesthesia Evaluation  Patient identified by MRN, date of birth, ID band Patient awake    Reviewed: Allergy & Precautions, NPO status , Patient's Chart, lab work & pertinent test results  Airway Mallampati: II  TM Distance: >3 FB     Dental   Pulmonary sleep apnea , former smoker,    Pulmonary exam normal        Cardiovascular hypertension, + CAD  Normal cardiovascular exam     Neuro/Psych  Neuromuscular disease negative psych ROS   GI/Hepatic Neg liver ROS, GERD  ,  Endo/Other  diabetes  Renal/GU negative Renal ROS  negative genitourinary   Musculoskeletal  (+) Arthritis ,   Abdominal Normal abdominal exam  (+)   Peds negative pediatric ROS (+)  Hematology negative hematology ROS (+)   Anesthesia Other Findings Past Medical History: No date: ADHD (attention deficit hyperactivity disorder) No date: Allergy No date: Arthritis No date: CAD (coronary artery disease)     Comment:  S/P cath May 2012 with BMS to the LAD following abnormal              stress test No date: Diabetes mellitus No date: Drug abuse ([place])     Comment:  history of marijuana use and alcohol No date: GERD (gastroesophageal reflux disease) No date: Hypercholesterolemia No date: Hypertension No date: Myocardial infarction ([place]) No date: Obesity No date: Oxygen deficiency No date: Sleep apnea  Reproductive/Obstetrics                             Anesthesia Physical Anesthesia Plan  ASA: III  Anesthesia Plan: General   Post-op Pain Management:    Induction: Intravenous  PONV Risk Score and Plan: Propofol infusion  Airway Management Planned: Nasal Cannula  Additional Equipment:   Intra-op Plan:   Post-operative Plan:   Informed Consent: I have reviewed the patients History and Physical, chart, labs and discussed the procedure including the risks, benefits and alternatives for the proposed anesthesia with  the patient or authorized representative who has indicated his/her understanding and acceptance.     Dental advisory given  Plan Discussed with: CRNA and Surgeon  Anesthesia Plan Comments:         Anesthesia Quick Evaluation

## 2020-10-10 NOTE — H&P (Signed)
[name] Bellows, MD [address] [place], [place], [place], [address] [address], [place], [place], [place], [address] Phone: [phone]  Fax: [phone]  Primary Care Physician:  [name], PA-C   Pre-Procedure History & Physical: HPI:  [name] [name] is a 45 y.o. male is here for an colonoscopy.   Past Medical History:  Diagnosis Date  . ADHD (attention deficit hyperactivity disorder)   . Allergy   . Arthritis   . CAD (coronary artery disease)    S/P cath May 2012 with BMS to the LAD following abnormal stress test  . Diabetes mellitus   . Drug abuse ([place])    history of marijuana use and alcohol  . GERD (gastroesophageal reflux disease)   . Hypercholesterolemia   . Hypertension   . Myocardial infarction ([place])   . Obesity   . Oxygen deficiency   . Sleep apnea     Past Surgical History:  Procedure Laterality Date  . ABSCESS DRAINAGE     right arm; approx 2003  . CARDIAC CATHETERIZATION    . CORONARY ANGIOPLASTY    . CORONARY STENT PLACEMENT  May 2012   LAD    Prior to Admission medications   Medication Sig Start Date End Date Taking? Authorizing Provider  amLODipine (NORVASC) 10 MG tablet TAKE 1 TABLET(10 MG) BY MOUTH DAILY 04/25/20  Yes [name] M, PA-C  cyclobenzaprine (FLEXERIL) 5 MG tablet Take 1 tablet (5 mg total) by mouth 2 (two) times daily as needed for muscle spasms. 05/13/18  Yes [name] M, PA-C  gabapentin (NEURONTIN) 300 MG capsule Take by mouth. 08/16/20  Yes [provider]  metoprolol succinate (TOPROL-XL) 25 MG 24 hr tablet TAKE 1 TABLET(25 MG) BY MOUTH AT BEDTIME 04/25/20  Yes Pollak, Adriana M, PA-C  nitroGLYCERIN (NITROSTAT) 0.4 MG SL tablet PLACE 1 TABLET UNDER THE TONGUE EVERY 5 MINUTES AS NEEDED FOR CHEST PAIN 07/19/20  Yes [name] M, PA-C  pantoprazole (PROTONIX) 40 MG tablet TAKE 1 TABLET(40 MG) BY MOUTH DAILY 03/14/20  Yes [name] M, PA-C  rosuvastatin (CRESTOR) 40 MG tablet TAKE 1  TABLET(40 MG) BY MOUTH DAILY 07/29/20  Yes [name] M, PA-C  ACCU-CHEK GUIDE test strip USE TO CHECK SUGARS EVERY MORNING 04/05/20   [name], PA-C  aspirin 81 MG tablet Take 81 mg by mouth daily.    [provider]  dicyclomine (BENTYL) 20 MG tablet Take 1 tablet (20 mg total) by mouth 4 (four) times daily -  before meals and at bedtime for 5 days. 08/01/20 08/06/20  Duffy Bruce, MD  ezetimibe (ZETIA) 10 MG tablet TAKE 1 TABLET(10 MG) BY MOUTH DAILY 11/20/19   Dunn, Areta Haber, PA-C  Insulin Pen Needle (NOVOFINE) 32G X 6 MM MISC Use once daily to inject victoza. Patient not taking: Reported on 10/10/2020 10/13/19   [name], PA-C  losartan (COZAAR) 25 MG tablet Take 1 tablet (25 mg total) by mouth daily. Patient not taking: No sig reported 09/16/20 09/11/21  Marrianne Mood D, PA-C  metFORMIN (GLUCOPHAGE) 1000 MG tablet Take 1,000 mg by mouth 2 (two) times daily with a meal.    [provider]  QUEtiapine (SEROQUEL XR) 50 MG TB24 24 hr tablet Take 1 tablet (50 mg total) by mouth at bedtime. 06/22/19 09/20/19  [name], PA-C    Allergies as of 09/26/2020 - Review Complete 09/25/2020  Allergen Reaction Noted  . Penicillins  10/01/2010    Family History  Problem Relation Age of Onset  . Hypertension Mother   . Diabetes Mother   . Hypertension Father   . Diabetes Father   . Heart disease Father        cabg 3; 1995; 18 stents  . Hypertension Sister   . Heart disease Paternal Uncle   . Heart disease Paternal Grandmother     Social History   Socioeconomic History  . Marital status: Married    Spouse name: Not on file  . Number of children: 4  . Years of education: Not on file  . Highest education level: Not on file  Occupational History  . Occupation: disabled  Tobacco Use  . Smoking status: Former Smoker    Packs/day: 1.00    Years: 14.00    Pack years: 14.00    Types: Cigarettes    Quit date: 06/2017    Years since quitting: 3.3  .  Smokeless tobacco: Never Used  Vaping Use  . Vaping Use: Never used  Substance and Sexual Activity  . Alcohol use: Not Currently    Alcohol/week: 10.0 standard drinks    Types: 2 Cans of beer, 8 Shots of liquor per week  . Drug use: Yes    Frequency: 20.0 times per week    Types: Marijuana  . Sexual activity: Yes  Other Topics Concern  . Not on file  Social History Narrative  . Not on file   Social Determinants of Health   Financial Resource Strain: Not on file  Food Insecurity: Not on file  Transportation Needs: Not on file  Physical Activity: Not on file  Stress: Not on file  Social Connections: Not on file  Intimate Partner Violence: Not on file    Review of Systems: See HPI, otherwise negative ROS  Physical Exam: BP (!) 144/98   Pulse 71   Temp 97.6 F (36.4 C) (Temporal)   Resp 20   Ht 6\' 3"  (1.905 m)   Wt 122.5 kg   SpO2 100%   BMI 33.75 kg/m  General:   Alert,  pleasant and cooperative in NAD Head:  Normocephalic and atraumatic. Neck:  Supple; no masses or thyromegaly. Lungs:  Clear throughout to auscultation, normal respiratory effort.    Heart:  +S1, +S2, Regular rate and rhythm, No edema. Abdomen:  Soft, nontender and nondistended. Normal bowel sounds, without guarding, and without rebound.   Neurologic:  Alert and  oriented x4;  grossly normal neurologically.  Impression/Plan: [name] [name] is here for an colonoscopy to be performed for rectal bleeding .  Risks, benefits, limitations, and alternatives regarding  colonoscopy have been reviewed with the patient.  Questions have been answered.  All parties agreeable.   [name] Bellows, MD  10/10/2020, 8:55 AM

## 2020-10-10 NOTE — Transfer of Care (Signed)
Immediate Anesthesia Transfer of Care Note  Patient: Yonis Carreon  Procedure(s) Performed: COLONOSCOPY WITH PROPOFOL (N/A )  Patient Location: PACU  Anesthesia Type:General  Level of Consciousness: awake, alert  and oriented  Airway & Oxygen Therapy: Patient Spontanous Breathing and Patient connected to face mask oxygen  Post-op Assessment: Report given to RN and Post -op Vital signs reviewed and stable  Post vital signs: Reviewed and stable  Last Vitals:  Vitals Value Taken Time  BP 104/64 10/10/20 0923  Temp 36.1 C 10/10/20 0922  Pulse 82 10/10/20 0931  Resp 20 10/10/20 0931  SpO2 100 % 10/10/20 0931  Vitals shown include unvalidated device data.  Last Pain:  Vitals:   10/10/20 0922  TempSrc: Temporal  PainSc: Asleep         Complications: No complications documented.

## 2020-10-10 NOTE — Anesthesia Postprocedure Evaluation (Signed)
Anesthesia Post Note  Patient: [name] [name]  Procedure(s) Performed: COLONOSCOPY WITH PROPOFOL (N/A )  Patient location during evaluation: Endoscopy Anesthesia Type: General Level of consciousness: awake and alert and oriented Pain management: pain level controlled Vital Signs Assessment: post-procedure vital signs reviewed and stable Respiratory status: spontaneous breathing Cardiovascular status: blood pressure returned to baseline Anesthetic complications: no   No complications documented.   Last Vitals:  Vitals:   10/10/20 0932 10/10/20 0942  BP: 107/79 122/88  Pulse: 77 75  Resp: 17 14  Temp:    SpO2: 99% 100%    Last Pain:  Vitals:   10/10/20 0942  TempSrc:   PainSc: 0-No pain                 ,

## 2020-10-11 ENCOUNTER — Encounter: Payer: Self-pay | Admitting: Gastroenterology

## 2020-10-14 ENCOUNTER — Other Ambulatory Visit: Payer: Self-pay

## 2020-10-14 ENCOUNTER — Encounter: Payer: Self-pay | Admitting: Gastroenterology

## 2020-10-14 ENCOUNTER — Encounter: Payer: Self-pay | Admitting: Family Medicine

## 2020-10-14 ENCOUNTER — Ambulatory Visit (INDEPENDENT_AMBULATORY_CARE_PROVIDER_SITE_OTHER): Admitting: Family Medicine

## 2020-10-14 VITALS — BP 117/78 | HR 66 | Wt 271.0 lb

## 2020-10-14 DIAGNOSIS — M7711 Lateral epicondylitis, right elbow: Secondary | ICD-10-CM

## 2020-10-14 DIAGNOSIS — M542 Cervicalgia: Secondary | ICD-10-CM

## 2020-10-14 DIAGNOSIS — E1165 Type 2 diabetes mellitus with hyperglycemia: Secondary | ICD-10-CM

## 2020-10-14 DIAGNOSIS — M7552 Bursitis of left shoulder: Secondary | ICD-10-CM | POA: Diagnosis not present

## 2020-10-14 LAB — SURGICAL PATHOLOGY

## 2020-10-14 LAB — POCT GLYCOSYLATED HEMOGLOBIN (HGB A1C): Hemoglobin A1C: 8.8 % — AB (ref 4.0–5.6)

## 2020-10-14 MED ORDER — GLIPIZIDE ER 2.5 MG PO TB24: 2.5000 mg | ORAL_TABLET | Freq: Every day | ORAL | 1 refills | Status: DC

## 2020-10-14 MED ORDER — ACCU-CHEK SOFTCLIX LANCETS MISC: 4 refills | Status: DC

## 2020-10-14 MED ORDER — NAPROXEN 500 MG PO TABS: 500.0000 mg | ORAL_TABLET | Freq: Two times a day (BID) | ORAL | 0 refills | Status: DC

## 2020-10-14 MED ORDER — ACCU-CHEK AVIVA PLUS W/DEVICE KIT: PACK | 0 refills | Status: DC

## 2020-10-14 MED ORDER — ACCU-CHEK AVIVA PLUS VI STRP: ORAL_STRIP | 4 refills | Status: DC

## 2020-10-14 NOTE — Patient Instructions (Signed)
.   Please review the attached list of medications and notify my office if there are any errors.   . Please bring all of your medications to every appointment so we can make sure that our medication list is the same as yours.   Go to the [facility] on [address] for neck Xray  

## 2020-10-14 NOTE — Progress Notes (Signed)
Established patient visit   Patient: [name] [name]   DOB: 1975/10/24   45 y.o. Male  MRN: [id number] Visit Date: 10/14/2020  Today's healthcare provider: Lelon Huh, MD   Chief Complaint  Patient presents with  . Shoulder Pain    Left shoulder  . Elbow Pain    Right elbow   Subjective    Shoulder Pain  The pain is present in the left shoulder. This is a chronic problem. The current episode started more than 1 year ago. There has been no history of extremity trauma. The problem has been unchanged. The quality of the pain is described as aching and sharp. Associated symptoms include joint locking. Pertinent negatives include no joint swelling, numbness or tingling. Exacerbated by: Lying on his shoulder, working out. He has tried OTC pain meds, heat and cold for the symptoms. The treatment provided no relief. Has been to [facility] where xrays revealed old injury, but no significant joint disease. He was referred to PT and states they discussed steroid injection but has had to many other more pressing medical appointments and has not gotten back to them.  Arm Pain  There was no injury mechanism. The pain is present in the right elbow. The quality of the pain is described as aching. Associated symptoms include muscle weakness. Pertinent negatives include no numbness or tingling. He has tried ice and heat for the symptoms. The treatment provided no relief. Needs prescription for a new glucose meter, test strips and lancets.    He is also due for follow up type diabetes. He tried Trulicity on more than one occasional but stopped due to severe GI side effects. He has been woking on diet and is now taking metfomrin consistently every day   Wt Readings from Last 3 Encounters:  10/14/20 271 lb (122.9 kg)  10/10/20 270 lb (122.5 kg)  09/25/20 269 lb 9.6 oz (122.3 kg)   Lab Results  Component Value Date   HGBA1C 8.1 (H) 07/08/2020   HGBA1C 7.3 (A) 03/22/2020   HGBA1C 12.0 (A)  09/20/2019    Allergies  Allergen Reactions  . Penicillins      Medications: Outpatient Medications Prior to Visit  Medication Sig  . amLODipine (NORVASC) 10 MG tablet TAKE 1 TABLET(10 MG) BY MOUTH DAILY  . aspirin 81 MG tablet Take 81 mg by mouth daily.  [name] ezetimibe (ZETIA) 10 MG tablet TAKE 1 TABLET(10 MG) BY MOUTH DAILY  . gabapentin (NEURONTIN) 300 MG capsule Take by mouth.  . losartan (COZAAR) 25 MG tablet Take 1 tablet (25 mg total) by mouth daily.  . metFORMIN (GLUCOPHAGE) 1000 MG tablet Take 1,000 mg by mouth 2 (two) times daily with a meal.  . metoprolol succinate (TOPROL-XL) 25 MG 24 hr tablet TAKE 1 TABLET(25 MG) BY MOUTH AT BEDTIME  . pantoprazole (PROTONIX) 40 MG tablet TAKE 1 TABLET(40 MG) BY MOUTH DAILY  . rosuvastatin (CRESTOR) 40 MG tablet TAKE 1 TABLET(40 MG) BY MOUTH DAILY  . ACCU-CHEK GUIDE test strip USE TO CHECK SUGARS EVERY MORNING  . cyclobenzaprine (FLEXERIL) 5 MG tablet Take 1 tablet (5 mg total) by mouth 2 (two) times daily as needed for muscle spasms.  [name] dicyclomine (BENTYL) 20 MG tablet Take 1 tablet (20 mg total) by mouth 4 (four) times daily -  before meals and at bedtime for 5 days.  . Insulin Pen Needle (NOVOFINE) 32G X 6 MM MISC Use once daily to inject victoza.  . nitroGLYCERIN (NITROSTAT) 0.4 MG SL tablet  PLACE 1 TABLET UNDER THE TONGUE EVERY 5 MINUTES AS NEEDED FOR CHEST PAIN  . QUEtiapine (SEROQUEL XR) 50 MG TB24 24 hr tablet Take 1 tablet (50 mg total) by mouth at bedtime.   No facility-administered medications prior to visit.    Review of Systems  Constitutional: Negative.   Musculoskeletal: Positive for arthralgias, neck pain and neck stiffness. Negative for back pain, gait problem, joint swelling and myalgias.  Neurological: Positive for weakness and headaches. Negative for dizziness, tingling, light-headedness and numbness.      Objective    BP 117/78 (BP Location: Left Arm, Patient Position: Sitting, Cuff Size: Large)   Pulse 66   Wt  271 lb (122.9 kg)   BMI 33.87 kg/m    Physical Exam   General: Appearance:    Obese male in no acute distress  Eyes:    PERRL, conjunctiva/corneas clear, EOM's intact       Lungs:     Clear to auscultation bilaterally, respirations unlabored  Heart:    Normal heart rate. Normal rhythm. No murmurs, rubs, or gallops.   MS:   All extremities are intact. Tender over right lateral epicondyle. Diffuse tenderness of left upper arm, shoulder, trapezius and cervical spine.   Neurologic:   Awake, alert, oriented x 3. No apparent focal neurological           defect.         Results for orders placed or performed in visit on 10/14/20  POCT glycosylated hemoglobin (Hb A1C)  Result Value Ref Range   Hemoglobin A1C 8.8 (A) 4.0 - 5.6 %    Assessment & Plan     ,1. Poorly controlled diabetes mellitus (HCC)  - Accu-Chek Softclix Lancets lancets; Use to check blood sugar daily for type 2 diabetes. E11.9  Dispense: 100 each; Refill: 4 - glucose blood (ACCU-CHEK AVIVA PLUS) test strip; Use to check blood sugar once a day for type 2 diabetes  Dispense: 100 each; Refill: 4 - Blood Glucose Monitoring Suppl (ACCU-CHEK AVIVA PLUS) w/Device KIT; Use to check blood sugar daily for type 2 diabetes. E11.9  Dispense: 1 kit; Refill: 0  Intolerant to Trulicity. Will add - glipiZIDE (GLIPIZIDE XL) 2.5 MG 24 hr tablet; Take 1 tablet (2.5 mg total) by mouth daily with breakfast.  Dispense: 90 tablet; Refill: 1   Recheck in 4 months.   2. Neck pain  - DG Cervical Spine Complete; Future - naproxen (NAPROSYN) 500 MG tablet; Take 1 tablet (500 mg total) by mouth 2 (two) times daily with a meal. For shoulder neck and arm pain  Dispense: 30 tablet; Refill: 0  3. Lateral epicondylitis of right elbow  - naproxen (NAPROSYN) 500 MG tablet; Take 1 tablet (500 mg total) by mouth 2 (two) times daily with a meal. For shoulder neck and arm pain  Dispense: 30 tablet; Refill: 0  4. Bursitis of left shoulder Will likely need  to follow up with orthopedics for steroid injection.    Future Appointments  Date Time Provider [place]  12/16/2020  1:20 PM Virginia Crews, MD [facility]  12/25/2020  9:00 AM Jonathon Bellows, MD [facility] None  02/17/2021  8:40 AM Caryn Section Kirstie Peri, MD [facility]         The entirety of the information documented in the History of Present Illness, Review of Systems and Physical Exam were personally obtained by me. Portions of this information were initially documented by the CMA and reviewed by me for thoroughness and accuracy.  Lelon Huh, MD  [facility] [phone] (phone) [phone] (fax)  [place]

## 2020-10-15 ENCOUNTER — Encounter: Payer: Self-pay | Admitting: Family Medicine

## 2020-10-15 DIAGNOSIS — Z86010 Personal history of colonic polyps: Secondary | ICD-10-CM | POA: Insufficient documentation

## 2020-10-21 ENCOUNTER — Telehealth: Payer: Self-pay | Admitting: Family Medicine

## 2020-10-21 DIAGNOSIS — K219 Gastro-esophageal reflux disease without esophagitis: Secondary | ICD-10-CM

## 2020-10-21 MED ORDER — PANTOPRAZOLE SODIUM 40 MG PO TBEC: 40.0000 mg | DELAYED_RELEASE_TABLET | Freq: Every day | ORAL | 4 refills | Status: DC

## 2020-10-21 NOTE — Telephone Encounter (Signed)
[facility] faxed refill request for the following medications:  pantoprazole (PROTONIX) 40 MG tablet  Last Rx: 03/14/20 90 day supply with 1 refill LOV: 10/14/20 with Dr. Caryn Section Please advise. Thanks TNP

## 2020-10-28 ENCOUNTER — Telehealth: Payer: Self-pay

## 2020-10-28 ENCOUNTER — Ambulatory Visit: Payer: Self-pay | Admitting: Family Medicine

## 2020-10-28 DIAGNOSIS — I10 Essential (primary) hypertension: Secondary | ICD-10-CM

## 2020-10-28 MED ORDER — METOPROLOL SUCCINATE ER 25 MG PO TB24: ORAL_TABLET | ORAL | 1 refills | Status: DC

## 2020-10-28 NOTE — Telephone Encounter (Signed)
[facility] faxed refill request for the following medications: ° °metoprolol succinate (TOPROL-XL) 25 MG 24 hr tablet  ° ° °Please advise. °

## 2020-12-16 ENCOUNTER — Ambulatory Visit: Payer: Self-pay | Admitting: Family Medicine

## 2020-12-25 ENCOUNTER — Ambulatory Visit: Payer: Self-pay | Admitting: *Deleted

## 2020-12-25 ENCOUNTER — Ambulatory Visit: Admitting: Gastroenterology

## 2020-12-25 NOTE — Telephone Encounter (Signed)
2nd attempt to contact patient and wife answered. Patient's wife was with patient and reports patient has been c/o feeling fatigue and body aches x 4-5 days. Wife reports patient has been doing things outside in heat and feels medications may cause fatigue and body aches. Denies chest pain, difficulty breathing, fever, cough, sore throat, no headache reported. Denies dizziness or lightheadedness. App scheduled for 01/08/21, earliest available . Care advise given. Patient and wife verbalized understanding of care advise and to call back or go to [facility] or ED if symptoms worsen.   Reason for Disposition  [1] MODERATE weakness (i.e., interferes with work, school, normal activities) AND [2] persists > 3 days  Answer Assessment - Initial Assessment Questions 1. DESCRIPTION: "Describe how you are feeling."     Fatigue and body aches  2. SEVERITY: "How bad is it?"  "Can you stand and walk?"   - MILD - Feels weak or tired, but does not interfere with work, school or normal activities   - [place] to stand and walk; weakness interferes with work, school, or normal activities   - SEVERE - Unable to stand or walk     Mild  3. ONSET:  "When did the weakness begin?"     4-5 days ago  4. CAUSE: "What do you think is causing the weakness?"     Not sure maybe medicines 5. MEDICINES: "Have you recently started a new medicine or had a change in the amount of a medicine?"     No  6. OTHER SYMPTOMS: "Do you have any other symptoms?" (e.g., chest pain, fever, cough, SOB, vomiting, diarrhea, bleeding, other areas of pain)     Fatigue and body aches  7. PREGNANCY: "Is there any chance you are pregnant?" "When was your last menstrual period?"     na  Protocols used: Weakness (Generalized) and Fatigue-A-AH

## 2020-12-25 NOTE — Telephone Encounter (Signed)
Patient's wife called to report patient with fatigue and body aches 4-5 days. No available appt with [facility] at this time.   Called patient to review sx. No answer, left voicemail to call clinic back.

## 2020-12-25 NOTE — Telephone Encounter (Signed)
FYI

## 2021-01-02 ENCOUNTER — Other Ambulatory Visit: Payer: Self-pay | Admitting: Physician Assistant

## 2021-01-08 ENCOUNTER — Ambulatory Visit: Payer: Self-pay | Admitting: Family Medicine

## 2021-01-10 ENCOUNTER — Encounter: Payer: Self-pay | Admitting: Family Medicine

## 2021-01-10 ENCOUNTER — Other Ambulatory Visit: Payer: Self-pay

## 2021-01-10 ENCOUNTER — Ambulatory Visit (INDEPENDENT_AMBULATORY_CARE_PROVIDER_SITE_OTHER): Payer: Medicaid Other | Admitting: Family Medicine

## 2021-01-10 VITALS — BP 128/87 | HR 78 | Temp 97.3°F | Resp 16 | Wt 270.0 lb

## 2021-01-10 DIAGNOSIS — E1165 Type 2 diabetes mellitus with hyperglycemia: Secondary | ICD-10-CM

## 2021-01-10 DIAGNOSIS — E1169 Type 2 diabetes mellitus with other specified complication: Secondary | ICD-10-CM | POA: Diagnosis not present

## 2021-01-10 DIAGNOSIS — M25512 Pain in left shoulder: Secondary | ICD-10-CM

## 2021-01-10 DIAGNOSIS — E785 Hyperlipidemia, unspecified: Secondary | ICD-10-CM

## 2021-01-10 DIAGNOSIS — G8929 Other chronic pain: Secondary | ICD-10-CM

## 2021-01-10 LAB — POCT GLYCOSYLATED HEMOGLOBIN (HGB A1C)
Est. average glucose Bld gHb Est-mCnc: 203
Hemoglobin A1C: 8.7 % — AB (ref 4.0–5.6)

## 2021-01-10 MED ORDER — ROSUVASTATIN CALCIUM 40 MG PO TABS
ORAL_TABLET | ORAL | 4 refills | Status: DC
Start: 2021-01-10 — End: 2021-10-29

## 2021-01-10 MED ORDER — GLIPIZIDE ER 5 MG PO TB24: 5.0000 mg | ORAL_TABLET | Freq: Every day | ORAL | 3 refills | Status: DC

## 2021-01-10 NOTE — Progress Notes (Deleted)
Joint Injection/Arthrocentesis  Date/Time: 01/10/2021 11:53 AM Performed by: Birdie Sons, MD Authorized by: Birdie Sons, MD   Sedation: Patient sedated: no  Needle size: 20 G Ultrasound guidance: no Approach: anterior Methylprednisolone amount: 20 mg

## 2021-01-10 NOTE — Patient Instructions (Addendum)
Go to the [facility] on [address] for shoulder  Xray Please review the attached list of medications and notify my office if there are any errors.   I recommend you go ahead and get one dose of the Covid vaccine now, then get the Omicron vaccine when it comes out later this fall or winter.

## 2021-01-10 NOTE — Progress Notes (Signed)
I,[name] [name],acting as a scribe for Lelon Huh, MD.,have documented all relevant documentation on the behalf of Lelon Huh, MD,as directed by  Lelon Huh, MD while in the presence of Lelon Huh, MD.   Established patient visit   Patient: [name] [name]   DOB: 1976-05-24   45 y.o. Male  MRN: [id number] Visit Date: 01/10/2021  Today's healthcare provider: Lelon Huh, MD   Chief Complaint  Patient presents with   Follow-up   Diabetes   Hypertension   Hyperlipidemia   Subjective    HPI  Diabetes Mellitus Type II, follow-up  Lab Results  Component Value Date   HGBA1C 8.8 (A) 10/14/2020   HGBA1C 8.1 (H) 07/08/2020   HGBA1C 7.3 (A) 03/22/2020   Last seen for diabetes 3 months ago.  Management since then includes; continue medications. He reports fair compliance with treatment. He is not having side effects. none  Home blood sugar records: fasting range: 150  Episodes of hypoglycemia? No none   Current insulin regiment: n/a Most Recent Eye Exam: 06/11/2020  --------------------------------------------------------------------------------------------------- Hypertension, follow-up  BP Readings from Last 3 Encounters:  01/10/21 128/87  10/14/20 117/78  10/10/20 122/88   Wt Readings from Last 3 Encounters:  01/10/21 270 lb (122.5 kg)  10/14/20 271 lb (122.9 kg)  10/10/20 270 lb (122.5 kg)     He was last seen for hypertension 3 months ago.  BP at that visit was 117/78. Management since that visit includes; on amlodipine and metoprolol. He reports good compliance with treatment. He is not having side effects. none He is not exercising. He is not adherent to low salt diet.   Outside blood pressures are normal.  He does not smoke.  Use of agents associated with hypertension: none.   --------------------------------------------------------------------------------------------------- Lipid/Cholesterol, follow-up  Last Lipid Panel: Lab Results   Component Value Date   CHOL 175 07/08/2020   LDLCALC 97 07/08/2020   LDLDIRECT 129 (H) 06/24/2018   HDL 41 07/08/2020   TRIG 217 (H) 07/08/2020    He was last seen for this 7 months ago.  Management since that visit includes; on rosuvastatin.  He reports good compliance with treatment. He is not having side effects. none  He is following a Regular diet. Current exercise: none  Last metabolic panel Lab Results  Component Value Date   GLUCOSE 151 (H) 08/01/2020   NA 138 08/01/2020   K 3.9 08/01/2020   BUN 10 08/01/2020   CREATININE 0.80 08/01/2020   GFRNONAA >60 08/01/2020   GFRAA 110 07/24/2020   CALCIUM 9.3 08/01/2020   AST 19 08/01/2020   ALT 33 08/01/2020   The 10-year ASCVD risk score Mikey Bussing DC Jr., et al., 2013) is: 11.6%  ---------------------------------------------------------------------------------------------------      Medications: Outpatient Medications Prior to Visit  Medication Sig   Accu-Chek Softclix Lancets lancets Use to check blood sugar daily for type 2 diabetes. E11.9   amLODipine (NORVASC) 10 MG tablet TAKE 1 TABLET(10 MG) BY MOUTH DAILY   aspirin 81 MG tablet Take 81 mg by mouth daily.   Blood Glucose Monitoring Suppl (ACCU-CHEK AVIVA PLUS) w/Device KIT Use to check blood sugar daily for type 2 diabetes. E11.9   ezetimibe (ZETIA) 10 MG tablet TAKE 1 TABLET(10 MG) BY MOUTH DAILY   gabapentin (NEURONTIN) 300 MG capsule Take by mouth.   glipiZIDE (GLIPIZIDE XL) 2.5 MG 24 hr tablet Take 1 tablet (2.5 mg total) by mouth daily with breakfast.   glucose blood (ACCU-CHEK AVIVA PLUS) test strip Use  to check blood sugar once a day for type 2 diabetes   metFORMIN (GLUCOPHAGE) 1000 MG tablet Take 1,000 mg by mouth 2 (two) times daily with a meal.   metoprolol succinate (TOPROL-XL) 25 MG 24 hr tablet TAKE 1 TABLET(25 MG) BY MOUTH AT BEDTIME   nitroGLYCERIN (NITROSTAT) 0.4 MG SL tablet PLACE 1 TABLET UNDER THE TONGUE EVERY 5 MINUTES AS NEEDED FOR CHEST  PAIN   pantoprazole (PROTONIX) 40 MG tablet Take 1 tablet (40 mg total) by mouth daily.   rosuvastatin (CRESTOR) 40 MG tablet TAKE 1 TABLET(40 MG) BY MOUTH DAILY   cyclobenzaprine (FLEXERIL) 5 MG tablet Take 1 tablet (5 mg total) by mouth 2 (two) times daily as needed for muscle spasms. (Patient not taking: Reported on 01/10/2021)   dicyclomine (BENTYL) 20 MG tablet Take 1 tablet (20 mg total) by mouth 4 (four) times daily -  before meals and at bedtime for 5 days. (Patient not taking: Reported on 01/10/2021)   Insulin Pen Needle (NOVOFINE) 32G X 6 MM MISC Use once daily to inject victoza. (Patient not taking: Reported on 01/10/2021)   losartan (COZAAR) 25 MG tablet Take 1 tablet (25 mg total) by mouth daily. (Patient not taking: Reported on 01/10/2021)   naproxen (NAPROSYN) 500 MG tablet Take 1 tablet (500 mg total) by mouth 2 (two) times daily with a meal. For shoulder neck and arm pain (Patient not taking: Reported on 01/10/2021)   QUEtiapine (SEROQUEL XR) 50 MG TB24 24 hr tablet Take 1 tablet (50 mg total) by mouth at bedtime.   No facility-administered medications prior to visit.    Review of Systems  Constitutional:  Positive for fatigue. Negative for appetite change, chills and fever.  Respiratory:  Negative for chest tightness, shortness of breath and wheezing.   Cardiovascular:  Negative for chest pain and palpitations.  Gastrointestinal:  Negative for abdominal pain, nausea and vomiting.      Objective    BP 128/87 (BP Location: Left Arm, Patient Position: Sitting, Cuff Size: Large)   Pulse 78   Temp (!) 97.3 F (36.3 C) (Temporal)   Resp 16   Wt 270 lb (122.5 kg)   SpO2 98%   BMI 33.75 kg/m     Physical Exam   General appearance: Obese male, cooperative and in no acute distress Head: Normocephalic, without obvious abnormality, atraumatic Respiratory: Respirations even and unlabored, normal respiratory rate Extremities: All extremities are intact.  Skin: Skin color, texture,  turgor normal. No rashes seen  Psych: Appropriate mood and affect. Neurologic: Mental status: Alert, oriented to person, place, and time, thought content appropriate.   Results for orders placed or performed in visit on 01/10/21  POCT glycosylated hemoglobin (Hb A1C)  Result Value Ref Range   Hemoglobin A1C 8.7 (A) 4.0 - 5.6 %   Est. average glucose Bld gHb Est-mCnc 203     Assessment & Plan     1. Poorly controlled diabetes mellitus ([place]) A1c continue to rise, likely aggravated by recent Covid infection. Will increase glipiZIDE (GLIPIZIDE XL) 5 MG 24 hr tablet to take 1 tablet (5 mg total) by mouth daily with breakfast.  Dispense: 90 tablet; Refill: 3  2. Chronic left shoulder pain Ongoing for may months.  - DG Shoulder Left; Future  3. Hyperlipidemia associated with type 2 diabetes mellitus ([facility]) refill rosuvastatin (CRESTOR) 40 MG tablet; TAKE 1 TABLET(40 MG) BY MOUTH DAILY  Dispense: 90 tablet; Refill: 4   Future Appointments  Date Time Provider [place]  04/22/2021  8:40 AM Birdie Sons, MD [facility]         The entirety of the information documented in the History of Present Illness, Review of Systems and Physical Exam were personally obtained by me. Portions of this information were initially documented by the CMA and reviewed by me for thoroughness and accuracy.     Lelon Huh, MD  [facility] [phone] (phone) [phone] (fax)  [place]

## 2021-01-15 ENCOUNTER — Telehealth: Payer: Self-pay | Admitting: Physician Assistant

## 2021-01-15 NOTE — Telephone Encounter (Signed)
..   Medicaid Managed Care   Unsuccessful Outreach Note  01/15/2021 Name: [name] [name] MRN: [id number] DOB: February 24, 1976  Referred by: [name], PA-C Reason for referral : High Risk Managed Medicaid (Called Mr.Edge today to offer him a phone visit with the [facility] RNCM. Person that answered the phone told me to call back at 4:00 and he should be home.)   An unsuccessful telephone outreach was attempted today. The patient was referred to the case management team for assistance with care management and care coordination.   Follow Up Plan: The care management team will reach out to the patient again over the next 2 days.   [place]

## 2021-01-28 ENCOUNTER — Other Ambulatory Visit: Payer: Self-pay | Admitting: Family Medicine

## 2021-01-28 DIAGNOSIS — E1165 Type 2 diabetes mellitus with hyperglycemia: Secondary | ICD-10-CM

## 2021-01-28 DIAGNOSIS — I10 Essential (primary) hypertension: Secondary | ICD-10-CM

## 2021-01-28 MED ORDER — METFORMIN HCL 1000 MG PO TABS: 1000.0000 mg | ORAL_TABLET | Freq: Two times a day (BID) | ORAL | 1 refills | Status: DC

## 2021-01-28 MED ORDER — AMLODIPINE BESYLATE 10 MG PO TABS: ORAL_TABLET | ORAL | 2 refills | Status: DC

## 2021-01-28 NOTE — Telephone Encounter (Signed)
Please advise if OK to refill medications.

## 2021-01-28 NOTE — Telephone Encounter (Signed)
Medication Refill - Medication:  amLODipine (NORVASC) 10 MG tablet 90 tablet 1 04/25/2020    Sig: TAKE 1 TABLET(10 MG) BY MOUTH DAILY   Sent to pharmacy as: amLODipine (NORVASC) 10 MG tablet   E-Prescribing Status: Receipt confirmed by pharmacy (04/25/2020  4:22 PM EST)    metFORMIN (GLUCOPHAGE) 1000 MG tablet       Sig - Route: Take 1,000 mg by mouth 2 (two) times daily with a meal. - Oral   Class: Historical Med   Pt wife has called and states that husband is out of above refills. Has the patient contacted their pharmacy? Yes.   (Agent: If no, request that the patient contact the pharmacy for the refill.) (Agent: If yes, when and what did the pharmacy advise?) call dr   Preferred Pharmacy (with phone number or street name):  [facility] [place], [place] - [address] [place] [facility]  [address] [place] [address]  Phone: [phone] Fax: [phone]  Hours: Not open 24 hours    Agent: Please be advised that RX refills may take up to 3 business days. We ask that you follow-up with your pharmacy.

## 2021-02-17 ENCOUNTER — Ambulatory Visit: Payer: Medicaid Other | Admitting: Family Medicine

## 2021-04-08 ENCOUNTER — Ambulatory Visit: Payer: Medicaid Other | Admitting: Family Medicine

## 2021-04-21 NOTE — Progress Notes (Deleted)
Established patient visit   Patient: [name] [name]   DOB: 08-15-1975   45 y.o. Male  MRN: [id number] Visit Date: 04/22/2021  Today's healthcare provider: Lelon Huh, MD   No chief complaint on file.  Subjective    HPI  Diabetes Mellitus Type II, Follow-up  Lab Results  Component Value Date   HGBA1C 8.7 (A) 01/10/2021   HGBA1C 8.8 (A) 10/14/2020   HGBA1C 8.1 (H) 07/08/2020   Wt Readings from Last 3 Encounters:  01/10/21 270 lb (122.5 kg)  10/14/20 271 lb (122.9 kg)  10/10/20 270 lb (122.5 kg)   Last seen for diabetes 3 months ago.  Management since then includes increasing glipiZIDE (GLIPIZIDE XL) from 2.19m daily, to 581m24 hr tablet to take 1 tablet (5 mg total) by mouth daily with breakfast. He reports {excellent/good/fair/poor:19665} compliance with treatment. He {is/is not:21021397} having side effects. {document side effects if present:1} Symptoms: {Yes/No:20286} fatigue {Yes/No:20286} foot ulcerations  {Yes/No:20286} appetite changes {Yes/No:20286} nausea  {Yes/No:20286} paresthesia of the feet  {Yes/No:20286} polydipsia  {Yes/No:20286} polyuria {Yes/No:20286} visual disturbances   {Yes/No:20286} vomiting     Home blood sugar records: {diabetes glucometry results:16657}  Episodes of hypoglycemia? {Yes/No:20286} {enter symptoms and frequency of symptoms if yes:1}   Current insulin regiment: none Most Recent Eye Exam: 06/21/2020 {Current exercise:16438:::1} {Current diet habits:16563:::1}  Pertinent Labs: Lab Results  Component Value Date   CHOL 175 07/08/2020   HDL 41 07/08/2020   LDLCALC 97 07/08/2020   LDLDIRECT 129 (H) 06/24/2018   TRIG 217 (H) 07/08/2020   CHOLHDL 4.3 07/08/2020   Lab Results  Component Value Date   NA 138 08/01/2020   K 3.9 08/01/2020   CREATININE 0.80 08/01/2020   GFRNONAA >60 08/01/2020   MICROALBUR 20 03/22/2019   LABMICR 18.4 03/22/2020      ---------------------------------------------------------------------------------------------------   {Link to patient history deactivated due to formatting error:1}  Medications: Outpatient Medications Prior to Visit  Medication Sig   Accu-Chek Softclix Lancets lancets Use to check blood sugar daily for type 2 diabetes. E11.9   amLODipine (NORVASC) 10 MG tablet TAKE 1 TABLET(10 MG) BY MOUTH DAILY   aspirin 81 MG tablet Take 81 mg by mouth daily.   Blood Glucose Monitoring Suppl (ACCU-CHEK AVIVA PLUS) w/Device KIT Use to check blood sugar daily for type 2 diabetes. E11.9   ezetimibe (ZETIA) 10 MG tablet TAKE 1 TABLET(10 MG) BY MOUTH DAILY   gabapentin (NEURONTIN) 300 MG capsule Take by mouth.   glipiZIDE (GLIPIZIDE XL) 5 MG 24 hr tablet Take 1 tablet (5 mg total) by mouth daily with breakfast.   glucose blood (ACCU-CHEK AVIVA PLUS) test strip Use to check blood sugar once a day for type 2 diabetes   losartan (COZAAR) 25 MG tablet Take 1 tablet (25 mg total) by mouth daily. (Patient not taking: Reported on 01/10/2021)   metFORMIN (GLUCOPHAGE) 1000 MG tablet Take 1 tablet (1,000 mg total) by mouth 2 (two) times daily with a meal.   metoprolol succinate (TOPROL-XL) 25 MG 24 hr tablet TAKE 1 TABLET(25 MG) BY MOUTH AT BEDTIME   naproxen (NAPROSYN) 500 MG tablet Take 1 tablet (500 mg total) by mouth 2 (two) times daily with a meal. For shoulder neck and arm pain (Patient not taking: Reported on 01/10/2021)   nitroGLYCERIN (NITROSTAT) 0.4 MG SL tablet PLACE 1 TABLET UNDER THE TONGUE EVERY 5 MINUTES AS NEEDED FOR CHEST PAIN   pantoprazole (PROTONIX) 40 MG tablet Take 1 tablet (40 mg total) by mouth  daily.   rosuvastatin (CRESTOR) 40 MG tablet TAKE 1 TABLET(40 MG) BY MOUTH DAILY   No facility-administered medications prior to visit.    Review of Systems  {Labs  Heme  Chem  Endocrine  Serology  Results Review (optional):23779}   Objective    There were no vitals taken for this visit. {Show  previous vital signs (optional):23777}  Physical Exam  ***  No results found for any visits on 04/22/21.  Assessment & Plan     ***  No follow-ups on file.      {provider attestation***:1}   Lelon Huh, MD  [facility] [phone] (phone) [phone] (fax)  [place]

## 2021-04-22 ENCOUNTER — Ambulatory Visit: Payer: Medicaid Other | Admitting: Family Medicine

## 2021-04-22 DIAGNOSIS — E1165 Type 2 diabetes mellitus with hyperglycemia: Secondary | ICD-10-CM

## 2021-04-22 DIAGNOSIS — E119 Type 2 diabetes mellitus without complications: Secondary | ICD-10-CM

## 2021-04-24 ENCOUNTER — Ambulatory Visit: Payer: Medicaid Other | Admitting: Physician Assistant

## 2021-04-29 ENCOUNTER — Ambulatory Visit: Payer: Medicaid Other | Admitting: Physician Assistant

## 2021-07-31 ENCOUNTER — Ambulatory Visit (INDEPENDENT_AMBULATORY_CARE_PROVIDER_SITE_OTHER): Admitting: Physician Assistant

## 2021-07-31 ENCOUNTER — Other Ambulatory Visit: Payer: Self-pay

## 2021-07-31 ENCOUNTER — Encounter: Payer: Self-pay | Admitting: Physician Assistant

## 2021-07-31 VITALS — BP 114/79 | HR 75 | Resp 16 | Ht 75.0 in | Wt 262.0 lb

## 2021-07-31 DIAGNOSIS — E785 Hyperlipidemia, unspecified: Secondary | ICD-10-CM | POA: Diagnosis not present

## 2021-07-31 DIAGNOSIS — E1165 Type 2 diabetes mellitus with hyperglycemia: Secondary | ICD-10-CM

## 2021-07-31 DIAGNOSIS — I10 Essential (primary) hypertension: Secondary | ICD-10-CM | POA: Diagnosis not present

## 2021-07-31 DIAGNOSIS — E1169 Type 2 diabetes mellitus with other specified complication: Secondary | ICD-10-CM

## 2021-07-31 LAB — POCT GLYCOSYLATED HEMOGLOBIN (HGB A1C): Hemoglobin A1C: 11.3 % — AB (ref 4.0–5.6)

## 2021-07-31 MED ORDER — METFORMIN HCL 1000 MG PO TABS: 1000.0000 mg | ORAL_TABLET | Freq: Two times a day (BID) | ORAL | 1 refills | Status: DC

## 2021-07-31 MED ORDER — EZETIMIBE 10 MG PO TABS: 10.0000 mg | ORAL_TABLET | Freq: Every day | ORAL | 2 refills | Status: DC

## 2021-07-31 MED ORDER — METOPROLOL SUCCINATE ER 25 MG PO TB24: ORAL_TABLET | ORAL | 1 refills | Status: DC

## 2021-07-31 MED ORDER — LOSARTAN POTASSIUM 25 MG PO TABS: 25.0000 mg | ORAL_TABLET | Freq: Every day | ORAL | 3 refills | Status: DC

## 2021-07-31 MED ORDER — AMLODIPINE BESYLATE 10 MG PO TABS: ORAL_TABLET | ORAL | 2 refills | Status: DC

## 2021-07-31 MED ORDER — GLIPIZIDE ER 5 MG PO TB24: 5.0000 mg | ORAL_TABLET | Freq: Every day | ORAL | 3 refills | Status: DC

## 2021-07-31 NOTE — Patient Instructions (Addendum)
Please take your Metformin 1000 mg by mouth twice per day with a meal Take the Glipizide 5mg  by mouth once per day Continue your exercise and diet efforts   Please follow up in 3 months for repeat A1c testing  Come back for labs after you have been fasting for 8 hours (water and black coffee are allowed but that's it)  We will update you with the results of your lab work once they are available.    It was nice to meet you and I appreciate the opportunity to be involved in your care

## 2021-07-31 NOTE — Assessment & Plan Note (Signed)
Chronic, historic concern  Appear stable and well managed with current medications Continue current medications Lab work to assess kidney function and Lipids

## 2021-07-31 NOTE — Assessment & Plan Note (Signed)
Chronic, ongoing condition Lipid panel ordered for monitoring  Appears well controlled on current medications Continue current regimen pending lab results.

## 2021-07-31 NOTE — Progress Notes (Signed)
Established patient visit   Patient: [name] [name]   DOB: May 14, 1976   46 y.o. Male  MRN: [id number] Visit Date: 07/31/2021  Today's healthcare provider: Dani Gobble , PA-C  Introduced myself to the patient as a Journalist, newspaper and provided education on APPs in clinical practice.     I,Joseline E Rosas,acting as a scribe for [employer], PA-C.,have documented all relevant documentation on the behalf of [place], PA-C,as directed by  [employer], PA-C while in the presence of  E , PA-C.   Chief Complaint  Patient presents with   Follow-up   Subjective    HPI  Diabetes Mellitus Type II, follow-up  Lab Results  Component Value Date   HGBA1C 11.3 (A) 07/31/2021   HGBA1C 8.7 (A) 01/10/2021   HGBA1C 8.8 (A) 10/14/2020   Last seen for diabetes 6 months ago.  Management since then includes increase glipiZIDE (GLIPIZIDE XL) 5 MG 24 hr tablet to take 1 tablet (5 mg total) by mouth daily with breakfast. Per patient he is only Metformin.  He reports excellent compliance with treatment. He is not having side effects.   Home blood sugar records:  120-170  . Has not checked for the pat 2 weeks. When he was checking they were </= 200, usually between 120-170   Episodes of hypoglycemia? 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 Is only drinking on the weekends now - consuming about 10 standard drinks over the weekend. States his goal is to cut this back and only drink on holidays   --------------------------------------------------------------------------------------------------- Hypertension, follow-up  BP Readings from Last 3 Encounters:  07/31/21 114/79  01/10/21 128/87  10/14/20 117/78   Wt Readings  from Last 3 Encounters:  07/31/21 262 lb (118.8 kg)  01/10/21 270 lb (122.5 kg)  10/14/20 271 lb (122.9 kg)     He was last seen for hypertension 6 months ago.  BP at that visit was 128/87. Management since that visit includes continue current treatment. He reports excellent compliance with treatment. He is not having side effects.  He is exercising. He is not adherent to low salt diet.   Outside blood pressures are not being checked regularly. States he has had a few instances of orthostatic bp changes but has not had any falls or injuries from this- only mild dizziness that resolves quickly States he stopped smoking cigarettes about 5 years ago but is using marijuana daily     --------------------------------------------------------------------------------------------------- Lipid/Cholesterol, follow-up  Last Lipid Panel: Lab Results  Component Value Date   CHOL 175 07/08/2020   LDLCALC 97 07/08/2020   LDLDIRECT 129 (H) 06/24/2018   HDL 41 07/08/2020   TRIG 217 (H) 07/08/2020    He was last seen for this 6 months ago.  Management since that visit includes continue Rosuvastatin 40 mg.  He reports excellent compliance with treatment. He is not having side effects.   Symptoms: No appetite changes No foot ulcerations  No chest pain No chest pressure/discomfort  No dyspnea No orthopnea  No fatigue No lower extremity edema  No palpitations No paroxysmal nocturnal dyspnea  No nausea Yes numbness or tingling of extremity-tingling in his toes  No polydipsia No polyuria  No speech difficulty No syncope    Last metabolic panel Lab Results  Component Value Date   GLUCOSE 151 (H) 08/01/2020   NA 138 08/01/2020   K 3.9 08/01/2020   BUN 10 08/01/2020   CREATININE 0.80 08/01/2020   GFRNONAA >60 08/01/2020   CALCIUM 9.3 08/01/2020   AST 19 08/01/2020   ALT 33 08/01/2020   The 10-year ASCVD risk score (Arnett DK, et al., 2019) is:  9.9%  ---------------------------------------------------------------------------------------------------   Medications: Outpatient Medications Prior to Visit  Medication Sig   Accu-Chek Softclix Lancets lancets Use to check blood sugar daily for type 2 diabetes. E11.9   amLODipine (NORVASC) 10 MG tablet TAKE 1 TABLET(10 MG) BY MOUTH DAILY   aspirin 81 MG tablet Take 81 mg by mouth daily.   Blood Glucose Monitoring Suppl (ACCU-CHEK AVIVA PLUS) w/Device KIT Use to check blood sugar daily for type 2 diabetes. E11.9   ezetimibe (ZETIA) 10 MG tablet TAKE 1 TABLET(10 MG) BY MOUTH DAILY   gabapentin (NEURONTIN) 300 MG capsule Take by mouth.   glipiZIDE (GLIPIZIDE XL) 5 MG 24 hr tablet Take 1 tablet (5 mg total) by mouth daily with breakfast.   glucose blood (ACCU-CHEK AVIVA PLUS) test strip Use to check blood sugar once a day for type 2 diabetes   losartan (COZAAR) 25 MG tablet Take 1 tablet (25 mg total) by mouth daily.   metFORMIN (GLUCOPHAGE) 1000 MG tablet Take 1 tablet (1,000 mg total) by mouth 2 (two) times daily with a meal.   metoprolol succinate (TOPROL-XL) 25 MG 24 hr tablet TAKE 1 TABLET(25 MG) BY MOUTH AT BEDTIME   naproxen (NAPROSYN) 500 MG tablet Take 1 tablet (500 mg total) by mouth 2 (two) times daily with a meal. For shoulder neck and arm pain   nitroGLYCERIN (NITROSTAT) 0.4 MG SL tablet PLACE 1 TABLET UNDER THE TONGUE EVERY 5 MINUTES AS NEEDED FOR CHEST PAIN   pantoprazole (PROTONIX) 40 MG tablet Take 1 tablet (40 mg total) by mouth daily.   rosuvastatin (CRESTOR) 40 MG tablet TAKE 1 TABLET(40 MG) BY MOUTH DAILY   No facility-administered medications prior to visit.    Review of Systems  Eyes:  Negative for visual disturbance.  Respiratory:  Negative for chest tightness.   Cardiovascular:  Negative for chest pain, palpitations and leg swelling.  Gastrointestinal:  Negative for diarrhea, nausea and vomiting.  Endocrine: Positive for polyuria. Negative for polydipsia and  polyphagia.  Genitourinary:  Positive for frequency. Negative for urgency.  Neurological:  Positive for numbness (stinging in toes). Negative for dizziness, syncope, weakness, light-headedness and headaches.  Psychiatric/Behavioral:  Negative for dysphoric mood and sleep disturbance. The patient is not nervous/anxious.       Objective    BP 114/79 (BP Location: Left Arm, Patient Position: Sitting, Cuff Size: Large)    Pulse 75    Resp 16    Ht 6' 3" (1.905 m)    Wt 262 lb (118.8 kg)    BMI 32.75 kg/m    Physical Exam Vitals reviewed.  Constitutional:      Appearance: Normal appearance.  HENT:     Head: Normocephalic and atraumatic.  Eyes:     Extraocular Movements: Extraocular movements intact.     Conjunctiva/sclera: Conjunctivae normal.     Pupils: Pupils are equal, round, and reactive to light.  Cardiovascular:     Rate and Rhythm: Normal rate and regular rhythm.     Pulses: Normal pulses.  Dorsalis pedis pulses are 2+ on the right side and 2+ on the left side.       Posterior tibial pulses are 2+ on the right side and 2+ on the left side.     Heart sounds: Normal heart sounds.  Pulmonary:     Effort: Pulmonary effort is normal.     Breath sounds: Normal breath sounds. No wheezing, rhonchi or rales.  Musculoskeletal:     Cervical back: Normal range of motion and neck supple.     Right foot: Normal range of motion. No deformity.     Left foot: Normal range of motion. No deformity.  Feet:     Right foot:     Skin integrity: Skin integrity normal.     Toenail Condition: Right toenails are normal.     Left foot:     Skin integrity: Skin integrity normal.     Toenail Condition: Left toenails are normal.  Skin:    General: Skin is warm and dry.  Neurological:     General: No focal deficit present.     Mental Status: He is alert and oriented to person, place, and time.  Psychiatric:        Mood and Affect: Mood normal.        Behavior: Behavior normal.         Thought Content: Thought content normal.        Judgment: Judgment normal.      Results for orders placed or performed in visit on 07/31/21  POCT glycosylated hemoglobin (Hb A1C)  Result Value Ref Range   Hemoglobin A1C 11.3 (A) 4.0 - 5.6 %   Est. average glucose Bld gHb Est-mCnc      Assessment & Plan     Problem List Items Addressed This Visit       Cardiovascular and Mediastinum   Essential hypertension    Chronic, historic concern  Appear stable and well managed with current medications Continue current medications Lab work to assess kidney function and Lipids       Relevant Medications   amLODipine (NORVASC) 10 MG tablet   ezetimibe (ZETIA) 10 MG tablet   losartan (COZAAR) 25 MG tablet   metoprolol succinate (TOPROL-XL) 25 MG 24 hr tablet   Other Relevant Orders   CBC w/Diff/Platelet   Lipid Profile     Endocrine   Hyperlipidemia associated with type 2 diabetes mellitus (HCC)    Chronic, ongoing condition Lipid panel ordered for monitoring  Appears well controlled on current medications Continue current regimen pending lab results.       Relevant Medications   glipiZIDE (GLIPIZIDE XL) 5 MG 24 hr tablet   amLODipine (NORVASC) 10 MG tablet   ezetimibe (ZETIA) 10 MG tablet   losartan (COZAAR) 25 MG tablet   metFORMIN (GLUCOPHAGE) 1000 MG tablet   metoprolol succinate (TOPROL-XL) 25 MG 24 hr tablet   Other Relevant Orders   Lipid Profile   Poorly controlled diabetes mellitus ([place]) - Primary    Chronic, current condition  A1c has increased to 11.3% since previous visit After in-depth discussion with pt and his wife regarding medications Patient has only been taking Metformin 1049m PO QD rather than twice per day and has not been taking Glipizide as instructed at previous visit Discussed their trepidation around returning to medications such as Trulicity and Victoza as he was in the hospital around the time they were initiated and they fear it was the  triggering factor Given this hesitancy I recommend  taking Metformin as previously prescribed, twice per day, and adding Glipizide Encouraged him to continue with diet and exercise regimen  Follow up in 3 months for repeat A1c and monitoring      Relevant Medications   glipiZIDE (GLIPIZIDE XL) 5 MG 24 hr tablet   losartan (COZAAR) 25 MG tablet   metFORMIN (GLUCOPHAGE) 1000 MG tablet   Other Relevant Orders   POCT glycosylated hemoglobin (Hb A1C) (Completed)   Urine Albumin-Creatinine with uACR   Comprehensive Metabolic Panel (CMET)   Lipid Profile   Other Visit Diagnoses     Type 2 diabetes mellitus with hyperglycemia, without long-term current use of insulin (HCC)       Relevant Medications   glipiZIDE (GLIPIZIDE XL) 5 MG 24 hr tablet   amLODipine (NORVASC) 10 MG tablet   losartan (COZAAR) 25 MG tablet   metFORMIN (GLUCOPHAGE) 1000 MG tablet        Return in about 3 months (around 10/28/2021) for Diabetes follow up .   I,  E , PA-C, have reviewed all documentation for this visit. The documentation on 07/31/21 for the exam, diagnosis, procedures, and orders are all accurate and complete.   , Glennie Isle MPH [place], PA-C  [facility] [phone] (phone) [phone] (fax)  [place]

## 2021-07-31 NOTE — Assessment & Plan Note (Addendum)
Chronic, current condition  A1c has increased to 11.3% since previous visit After in-depth discussion with pt and his wife regarding medications Patient has only been taking Metformin 1000mg  PO QD rather than twice per day and has not been taking Glipizide as instructed at previous visit Discussed their trepidation around returning to medications such as Trulicity and Victoza as he was in the hospital around the time they were initiated and they fear it was the triggering factor Given this hesitancy I recommend taking Metformin as previously prescribed, twice per day, and adding Glipizide Encouraged him to continue with diet and exercise regimen  Follow up in 3 months for repeat A1c and monitoring

## 2021-08-01 LAB — SPECIMEN STATUS REPORT

## 2021-08-01 LAB — MICROALBUMIN / CREATININE URINE RATIO
Creatinine, Urine: 348.1 mg/dL
Microalb/Creat Ratio: 10 mg/g creat (ref 0–29)
Microalbumin, Urine: 33.7 ug/mL

## 2021-10-16 NOTE — Progress Notes (Signed)
? ?Follow-up Outpatient Visit ?Date: 10/17/2021 ? ?Primary Care Provider: ?[name], FNP ?[facility] ?[place] [address] ? ?Chief Complaint: Follow-up coronary artery disease ? ?HPI:  [name] [name] is a 46 y.o. male with history of coronary artery disease status post bare-metal stent to the LAD (2012), hypertension, hyperlipidemia, diabetes mellitus, obesity, obstructive sleep apnea, ADHD, and polysubstance abuse, who presents for follow-up of coronary artery disease.  He was last seen in our office in 09/2020 by Marrianne Mood, PA, at which time he was doing well without chest pain or shortness of breath.  Blood pressure was mildly elevated, prompting addition of losartan. ? ?Today, [name] [name] reports he has been feeling fairly well.  A few months ago, he noticed sporadic "pinprick" sensation in the left chest that occurred randomly.  It would only last a second or 2 and occurred daily for few days.  He has not had any exertional symptoms and no pain reminiscent of his MI in 2012.  He denies shortness of breath, palpitations, lightheadedness, and edema, though he is aware of his heart beating more forcefully when he exercises.  He attributes his elevated blood pressure today to having not taken his medicines yet this morning.  Blood pressures have been in the normal range at other MD visits over the last year. ? ?-------------------------------------------------------------------------------------------------- ? ?Past Medical History:  ?Diagnosis Date  ? ADHD (attention deficit hyperactivity disorder)   ? Allergy   ? Arthritis   ? CAD (coronary artery disease)   ? S/P cath May 2012 with BMS to the LAD following abnormal stress test  ? Diabetes mellitus   ? Drug abuse ([place])   ? history of marijuana use and alcohol  ? GERD (gastroesophageal reflux disease)   ? Hypercholesterolemia   ? Hypertension   ? Myocardial infarction [facility])   ? Obesity   ? Oxygen deficiency   ? Sleep apnea   ? ?Past Surgical  History:  ?Procedure Laterality Date  ? ABSCESS DRAINAGE    ? right arm; approx 2003  ? CARDIAC CATHETERIZATION    ? COLONOSCOPY WITH PROPOFOL N/A 10/10/2020  ? Procedure: COLONOSCOPY WITH PROPOFOL;  Surgeon: Jonathon Bellows, MD;  Location: [facility] ENDOSCOPY;  Service: Gastroenterology;  Laterality: N/A;  ? CORONARY ANGIOPLASTY    ? CORONARY STENT PLACEMENT  May 2012  ? LAD  ? ? ?Current Meds  ?Medication Sig  ? Accu-Chek Softclix Lancets lancets Use to check blood sugar daily for type 2 diabetes. E11.9  ? amLODipine (NORVASC) 10 MG tablet TAKE 1 TABLET(10 MG) BY MOUTH DAILY  ? aspirin 81 MG tablet Take 81 mg by mouth daily.  ? Blood Glucose Monitoring Suppl (ACCU-CHEK AVIVA PLUS) w/Device KIT Use to check blood sugar daily for type 2 diabetes. E11.9  ? ezetimibe (ZETIA) 10 MG tablet Take 1 tablet (10 mg total) by mouth daily.  ? gabapentin (NEURONTIN) 300 MG capsule Take by mouth.  ? glipiZIDE (GLIPIZIDE XL) 5 MG 24 hr tablet Take 1 tablet (5 mg total) by mouth daily with breakfast.  ? glucose blood (ACCU-CHEK AVIVA PLUS) test strip Use to check blood sugar once a day for type 2 diabetes  ? losartan (COZAAR) 25 MG tablet Take 1 tablet (25 mg total) by mouth daily.  ? metFORMIN (GLUCOPHAGE) 1000 MG tablet Take 1 tablet (1,000 mg total) by mouth 2 (two) times daily with a meal.  ? metoprolol succinate (TOPROL-XL) 25 MG 24 hr tablet TAKE 1 TABLET(25 MG) BY MOUTH AT BEDTIME  ? naproxen (  NAPROSYN) 500 MG tablet Take 1 tablet (500 mg total) by mouth 2 (two) times daily with a meal. For shoulder neck and arm pain  ? nitroGLYCERIN (NITROSTAT) 0.4 MG SL tablet PLACE 1 TABLET UNDER THE TONGUE EVERY 5 MINUTES AS NEEDED FOR CHEST PAIN  ? pantoprazole (PROTONIX) 40 MG tablet Take 1 tablet (40 mg total) by mouth daily.  ? rosuvastatin (CRESTOR) 40 MG tablet TAKE 1 TABLET(40 MG) BY MOUTH DAILY  ? ? ?Allergies: Trulicity [dulaglutide] and Penicillins ? ?Social History  ? ?Tobacco Use  ? Smoking status: Former  ?  Packs/day: 1.00  ?  Years:  14.00  ?  Pack years: 14.00  ?  Types: Cigarettes  ?  Quit date: 06/2017  ?  Years since quitting: 4.3  ? Smokeless tobacco: Never  ?Vaping Use  ? Vaping Use: Never used  ?Substance Use Topics  ? Alcohol use: Yes  ?  Alcohol/week: 10.0 standard drinks  ?  Types: 2 Cans of beer, 8 Shots of liquor per week  ?  Comment: weekends  ? Drug use: Yes  ?  Frequency: 20.0 times per week  ?  Types: Marijuana  ? ? ?Family History  ?Problem Relation Age of Onset  ? Hypertension Mother   ? Diabetes Mother   ? Cancer Mother   ? Hypertension Father   ? Diabetes Father   ? Heart disease Father   ?     cabg 3; 1995; 18 stents  ? Hypertension Sister   ? Heart disease Paternal Uncle   ? Heart disease Paternal Grandmother   ? ? ?Review of Systems: ?A 12-system review of systems was performed and was negative except as noted in the HPI. ? ?-------------------------------------------------------------------------------------------------- ? ?Physical Exam: ?BP (!) 144/100 (BP Location: Left Arm, Patient Position: Sitting, Cuff Size: Large)   Pulse 72   Ht _0  (1.905 m)   Wt 267 lb (121.1 kg)   SpO2 99%   BMI 33.37 kg/m?  ? ?General:  NAD. ?Neck: No JVD or HJR. ?Lungs: Clear to auscultation bilaterally without wheezes or crackles. ?Heart: Regular rate and rhythm without murmurs, rubs, or gallops. ?Abdomen: Soft, nontender, nondistended. ?Extremities: No lower extremity edema. ? ?EKG: Normal sinus rhythm with first-degree AV block (PR interval 224 ms).  Heart rate has increased since 09/16/2020.  Otherwise, no significant interval change. ? ?Lab Results  ?Component Value Date  ? WBC 7.1 08/01/2020  ? HGB 15.7 08/01/2020  ? HCT 44.8 08/01/2020  ? MCV 90.3 08/01/2020  ? PLT 281 08/01/2020  ? ? ?Lab Results  ?Component Value Date  ? NA 138 08/01/2020  ? K 3.9 08/01/2020  ? CL 108 08/01/2020  ? CO2 20 (L) 08/01/2020  ? BUN 10 08/01/2020  ? CREATININE 0.80 08/01/2020  ? GLUCOSE 151 (H) 08/01/2020  ? ALT 33 08/01/2020  ? ? ?Lab Results   ?Component Value Date  ? CHOL 175 07/08/2020  ? HDL 41 07/08/2020  ? [place] 97 07/08/2020  ? LDLDIRECT 129 (H) 06/24/2018  ? TRIG 217 (H) 07/08/2020  ? CHOLHDL 4.3 07/08/2020  ? ? ?-------------------------------------------------------------------------------------------------- ? ?ASSESSMENT AND PLAN: ?Coronary artery disease: ?[name] [name] continues to do well without recurrent angina.  His random pinprick discomfort a few months ago is nonspecific and unlikely to reflect coronary insufficiency.  We will continue his current medications for secondary prevention.  We will plan to check a CBC, CMP, and lipid panel today. ? ?Hypertension: ?Blood pressure mildly elevated today but typically better at other provider  visits over the last year.  [name] [name] attributes today's high reading to not having taken his medications yet today.  I encouraged him to monitor his blood pressure at home and to contact us or his PCP if it is consistently above 130/80.  We will defer medication changes at this time. ? ?Hyperlipidemia associated with type 2 diabetes mellitus: ?As it has been over a year since last lipid assessment, we will check a CMP and lipid panel today, as well as a hemoglobin A1c.  Continue rosuvastatin 40 mg daily.  Ongoing management of DM per PCP. ? ?Follow-up: Return to clinic in 1 year. ? ?Nelva Bush, MD ?10/17/2021 ?9:59 AM ? ?

## 2021-10-17 ENCOUNTER — Encounter: Payer: Self-pay | Admitting: Internal Medicine

## 2021-10-17 ENCOUNTER — Ambulatory Visit (INDEPENDENT_AMBULATORY_CARE_PROVIDER_SITE_OTHER): Payer: Medicaid Other | Admitting: Internal Medicine

## 2021-10-17 VITALS — BP 144/100 | HR 72 | Ht 75.0 in | Wt 267.0 lb

## 2021-10-17 DIAGNOSIS — E119 Type 2 diabetes mellitus without complications: Secondary | ICD-10-CM

## 2021-10-17 DIAGNOSIS — I10 Essential (primary) hypertension: Secondary | ICD-10-CM

## 2021-10-17 DIAGNOSIS — I2510 Atherosclerotic heart disease of native coronary artery without angina pectoris: Secondary | ICD-10-CM

## 2021-10-17 DIAGNOSIS — E1169 Type 2 diabetes mellitus with other specified complication: Secondary | ICD-10-CM | POA: Diagnosis not present

## 2021-10-17 DIAGNOSIS — E785 Hyperlipidemia, unspecified: Secondary | ICD-10-CM | POA: Diagnosis not present

## 2021-10-17 NOTE — Patient Instructions (Signed)
Medication Instructions:  ? ?Your physician recommends that you continue on your current medications as directed. Please refer to the Current Medication list given to you today.  ? ?*If you need a refill on your cardiac medications before your next appointment, please call your pharmacy* ? ? ?Lab Work: ? ?Today: CBC, CMP, lipid profile, HgbA1c ? ?Please go to the [facility] to have these drawn.  ? ?If you have labs (blood work) drawn today and your tests are completely normal, you will receive your results only by: ?MyChart Message (if you have MyChart) OR ?A paper copy in the mail ?If you have any lab test that is abnormal or we need to change your treatment, we will call you to review the results. ? ? ?Testing/Procedures: ?None ordered ? ? ?Follow-Up: ?At [facility], you and your health needs are our priority.  As part of our continuing mission to provide you with exceptional heart care, we have created designated Provider Care Teams.  These Care Teams include your primary Cardiologist (physician) and Advanced Practice Providers (APPs -  Physician Assistants and Nurse Practitioners) who all work together to provide you with the care you need, when you need it. ? ?We recommend signing up for the patient portal called "MyChart".  Sign up information is provided on this After Visit Summary.  MyChart is used to connect with patients for Virtual Visits (Telemedicine).  Patients are able to view lab/test results, encounter notes, upcoming appointments, etc.  Non-urgent messages can be sent to your provider as well.   ?To learn more about what you can do with MyChart, go to [email or web address].   ? ?Your next appointment:   ? ?Your physician wants you to follow-up in: 1 year.  ? ?You will receive a reminder letter in the mail two months in advance. If you don't receive a letter, please call our office to schedule the follow-up appointment.  ? ?The format for your next appointment:   ?In Person ? ?Provider:    ?You may see Nelva Bush, MD or one of the following Advanced Practice Providers on your designated Care Team:   ?Murray Hodgkins, NP ?Christell Faith, PA-C ?Cadence Kathlen Mody, PA-C ? ? ?Other Instructions ?N/A ? ?Important Information About Sugar ? ? ? ? ?  ?

## 2021-10-18 ENCOUNTER — Encounter: Payer: Self-pay | Admitting: Internal Medicine

## 2021-10-29 ENCOUNTER — Encounter: Payer: Self-pay | Admitting: Family Medicine

## 2021-10-29 ENCOUNTER — Ambulatory Visit: Admitting: Family Medicine

## 2021-10-29 VITALS — BP 132/81 | HR 71 | Temp 97.8°F | Resp 16 | Wt 270.0 lb

## 2021-10-29 DIAGNOSIS — E1169 Type 2 diabetes mellitus with other specified complication: Secondary | ICD-10-CM

## 2021-10-29 DIAGNOSIS — E785 Hyperlipidemia, unspecified: Secondary | ICD-10-CM

## 2021-10-29 DIAGNOSIS — F1911 Other psychoactive substance abuse, in remission: Secondary | ICD-10-CM

## 2021-10-29 DIAGNOSIS — F17201 Nicotine dependence, unspecified, in remission: Secondary | ICD-10-CM | POA: Diagnosis not present

## 2021-10-29 DIAGNOSIS — E1140 Type 2 diabetes mellitus with diabetic neuropathy, unspecified: Secondary | ICD-10-CM | POA: Diagnosis not present

## 2021-10-29 LAB — POCT GLYCOSYLATED HEMOGLOBIN (HGB A1C)
Est. average glucose Bld gHb Est-mCnc: 169
Hemoglobin A1C: 7.5 % — AB (ref 4.0–5.6)

## 2021-10-29 MED ORDER — GABAPENTIN 300 MG PO CAPS: 300.0000 mg | ORAL_CAPSULE | Freq: Two times a day (BID) | ORAL | 3 refills | Status: DC

## 2021-10-29 MED ORDER — ROSUVASTATIN CALCIUM 40 MG PO TABS: ORAL_TABLET | ORAL | 4 refills | Status: DC

## 2021-10-29 NOTE — Progress Notes (Signed)
I,[name],acting as a Education administrator for [name] Sprout, FNP.,have documented all relevant documentation on the behalf of [name] Sprout, FNP,as directed by  [name] Sprout, FNP while in the presence of [name] Sprout, FNP.   Established patient visit   Patient: [name] [name]   DOB: May 25, 1976   46 y.o. Male  MRN: [id number] Visit Date: 10/29/2021  Today's healthcare provider: [name], FNP  Patient presents for new patient visit to establish care.  Introduced to Designer, jewellery role and practice setting.  All questions answered.  Discussed provider/patient relationship and expectations.  Chief Complaint  Patient presents with   Diabetes   Subjective    HPI  Diabetes Mellitus Type II, Follow-up  Lab Results  Component Value Date   HGBA1C 7.5 (A) 10/29/2021   HGBA1C 11.3 (A) 07/31/2021   HGBA1C 8.7 (A) 01/10/2021   Wt Readings from Last 3 Encounters:  10/29/21 270 lb (122.5 kg)  10/17/21 267 lb (121.1 kg)  07/31/21 262 lb (118.8 kg)   Last seen for diabetes 3 months ago.  Management since then includes no changes. He reports excellent compliance with treatment. He is not having side effects.  Symptoms: No fatigue No foot ulcerations  No appetite changes No nausea  Yes paresthesia of the feet  No polydipsia  No polyuria No visual disturbances   No vomiting     Home blood sugar records: fasting range: 150  Episodes of hypoglycemia? No    Current insulin regiment: none Most Recent Eye Exam:  Current exercise: cardiovascular workout on exercise equipment and weightlifting Current diet habits: in general, a "healthy" diet    Pertinent Labs: Lab Results  Component Value Date   CHOL 175 07/08/2020   HDL 41 07/08/2020   LDLCALC 97 07/08/2020   LDLDIRECT 129 (H) 06/24/2018   TRIG 217 (H) 07/08/2020   CHOLHDL 4.3 07/08/2020   Lab Results  Component Value Date   NA 138 08/01/2020   K 3.9 08/01/2020   CREATININE 0.80 08/01/2020   GFRNONAA >60  08/01/2020   MICROALBUR 20 03/22/2019   LABMICR 33.7 07/31/2021     ---------------------------------------------------------------------------------------------------   Medications: Outpatient Medications Prior to Visit  Medication Sig   Accu-Chek Softclix Lancets lancets Use to check blood sugar daily for type 2 diabetes. E11.9   amLODipine (NORVASC) 10 MG tablet TAKE 1 TABLET(10 MG) BY MOUTH DAILY   aspirin 81 MG tablet Take 81 mg by mouth daily.   Blood Glucose Monitoring Suppl (ACCU-CHEK AVIVA PLUS) w/Device KIT Use to check blood sugar daily for type 2 diabetes. E11.9   ezetimibe (ZETIA) 10 MG tablet Take 1 tablet (10 mg total) by mouth daily.   glipiZIDE (GLIPIZIDE XL) 5 MG 24 hr tablet Take 1 tablet (5 mg total) by mouth daily with breakfast.   glucose blood (ACCU-CHEK AVIVA PLUS) test strip Use to check blood sugar once a day for type 2 diabetes   losartan (COZAAR) 25 MG tablet Take 1 tablet (25 mg total) by mouth daily.   metFORMIN (GLUCOPHAGE) 1000 MG tablet Take 1 tablet (1,000 mg total) by mouth 2 (two) times daily with a meal.   metoprolol succinate (TOPROL-XL) 25 MG 24 hr tablet TAKE 1 TABLET(25 MG) BY MOUTH AT BEDTIME   naproxen (NAPROSYN) 500 MG tablet Take 1 tablet (500 mg total) by mouth 2 (two) times daily with a meal. For shoulder neck and arm pain   nitroGLYCERIN (NITROSTAT) 0.4 MG SL tablet PLACE 1 TABLET UNDER  THE TONGUE EVERY 5 MINUTES AS NEEDED FOR CHEST PAIN   pantoprazole (PROTONIX) 40 MG tablet Take 1 tablet (40 mg total) by mouth daily.   [DISCONTINUED] gabapentin (NEURONTIN) 300 MG capsule Take by mouth.   [DISCONTINUED] rosuvastatin (CRESTOR) 40 MG tablet TAKE 1 TABLET(40 MG) BY MOUTH DAILY   No facility-administered medications prior to visit.    Review of Systems  Constitutional:  Negative for appetite change.  Eyes:  Negative for visual disturbance.  Respiratory:  Negative for chest tightness.   Cardiovascular:  Negative for chest pain,  palpitations and leg swelling.  Gastrointestinal:  Negative for diarrhea, nausea and vomiting.  Endocrine: Negative for polydipsia, polyphagia and polyuria.  Genitourinary:  Negative for frequency and urgency.  Neurological:  Positive for numbness (stinging in toes). Negative for dizziness, syncope, weakness, light-headedness and headaches.  Psychiatric/Behavioral:  Negative for dysphoric mood and sleep disturbance. The patient is not nervous/anxious.        Objective    BP 132/81 (BP Location: Left Arm, Patient Position: Sitting, Cuff Size: Large)   Pulse 71   Temp 97.8 F (36.6 C) (Oral)   Resp 16   Wt 270 lb (122.5 kg)   BMI 33.75 kg/m  BP Readings from Last 3 Encounters:  10/29/21 132/81  10/17/21 (!) 144/100  07/31/21 114/79   Wt Readings from Last 3 Encounters:  10/29/21 270 lb (122.5 kg)  10/17/21 267 lb (121.1 kg)  07/31/21 262 lb (118.8 kg)      Physical Exam Vitals and nursing note reviewed.  Constitutional:      Appearance: Normal appearance. He is obese.  HENT:     Head: Normocephalic and atraumatic.  Eyes:     Pupils: Pupils are equal, round, and reactive to light.  Cardiovascular:     Rate and Rhythm: Normal rate and regular rhythm.     Pulses: Normal pulses.     Heart sounds: Normal heart sounds.  Pulmonary:     Effort: Pulmonary effort is normal.     Breath sounds: Normal breath sounds.  Musculoskeletal:        General: Normal range of motion.     Cervical back: Normal range of motion.  Skin:    General: Skin is warm and dry.     Capillary Refill: Capillary refill takes less than 2 seconds.  Neurological:     General: No focal deficit present.     Mental Status: He is alert and oriented to person, place, and time. Mental status is at baseline.  Psychiatric:        Mood and Affect: Mood normal.        Behavior: Behavior normal.        Thought Content: Thought content normal.        Judgment: Judgment normal.     Results for orders placed or  performed in visit on 10/29/21  POCT glycosylated hemoglobin (Hb A1C)  Result Value Ref Range   Hemoglobin A1C 7.5 (A) 4.0 - 5.6 %   Est. average glucose Bld gHb Est-mCnc 169     Assessment & Plan     Problem List Items Addressed This Visit       Endocrine   Hyperlipidemia associated with type 2 diabetes mellitus ([place]) - Primary    LDL goal of <70 Continue Crestor at 40 mg I recommend diet low in saturated fat and regular exercise - 30 min at least 5 times per week        Relevant Medications  rosuvastatin (CRESTOR) 40 MG tablet   Type 2 diabetes mellitus with diabetic neuropathy, without long-term current use of insulin (HCC)    Chronic, now stable Repeat A1c in 3 months Continue Glipizide 5 mg Continue Metformin 1000 mg BID w/meals  Encouraged to make DM eye appt  BP and cholesterol also treated as well as neuropathy        Relevant Medications   gabapentin (NEURONTIN) 300 MG capsule   rosuvastatin (CRESTOR) 40 MG tablet   Other Relevant Orders   POCT glycosylated hemoglobin (Hb A1C) (Completed)   Microalbumin / creatinine urine ratio     Other   Substance abuse in remission [facility])    Kudos provided; continues to make this choice for his family and grandchildren        Tobacco abuse, in remission    Congratulated The 10-year ASCVD risk score (Arnett DK, et al., 2019) is: 12.9%   Values used to calculate the score:     Age: 32 years     Sex: Male     Is Non-Hispanic African American: Yes     Diabetic: Yes     Tobacco smoker: No     Systolic Blood Pressure: 267 mmHg     Is BP treated: Yes     HDL Cholesterol: 41 mg/dL     Total Cholesterol: 175 mg/dL          Return in about 3 months (around 01/29/2022) for annual examination.      [name], FNP, have reviewed all documentation for this visit. The documentation on 10/29/21 for the exam, diagnosis, procedures, and orders are all accurate and complete.    [name] [name], [place] [phone] (phone) [phone] (fax)  [place]

## 2021-10-29 NOTE — Assessment & Plan Note (Signed)
Chronic, now stable Repeat A1c in 3 months Continue Glipizide 5 mg Continue Metformin 1000 mg BID w/meals  Encouraged to make DM eye appt  BP and cholesterol also treated as well as neuropathy

## 2021-10-29 NOTE — Assessment & Plan Note (Signed)
Congratulated The 10-year ASCVD risk score (Arnett DK, et al., 2019) is: 12.9%   Values used to calculate the score:     Age: 46 years     Sex: Male     Is Non-Hispanic African American: Yes     Diabetic: Yes     Tobacco smoker: No     Systolic Blood Pressure: 643 mmHg     Is BP treated: Yes     HDL Cholesterol: 41 mg/dL     Total Cholesterol: 175 mg/dL

## 2021-10-29 NOTE — Assessment & Plan Note (Signed)
Kudos provided; continues to make this choice for his family and grandchildren

## 2021-10-29 NOTE — Assessment & Plan Note (Signed)
LDL goal of <70 Continue Crestor at 40 mg I recommend diet low in saturated fat and regular exercise - 30 min at least 5 times per week

## 2021-10-29 NOTE — Patient Instructions (Signed)
The 10-year ASCVD risk score (Arnett DK, et al., 2019) is: 12.9%   Values used to calculate the score:     Age: 46 years     Sex: Male     Is Non-Hispanic African American: Yes     Diabetic: Yes     Tobacco smoker: No     Systolic Blood Pressure: 932 mmHg     Is BP treated: Yes     HDL Cholesterol: 41 mg/dL     Total Cholesterol: 175 mg/dL

## 2021-10-30 ENCOUNTER — Other Ambulatory Visit: Payer: Self-pay | Admitting: Family Medicine

## 2021-10-30 DIAGNOSIS — E1165 Type 2 diabetes mellitus with hyperglycemia: Secondary | ICD-10-CM

## 2021-10-30 LAB — MICROALBUMIN / CREATININE URINE RATIO
Creatinine, Urine: 35 mg/dL
Microalb/Creat Ratio: <9 mg/g creat (ref 0–29)
Microalbumin, Urine: <3 ug/mL

## 2021-10-30 NOTE — Telephone Encounter (Signed)
Medication Refill - Medication: glucose blood (ACCU-CHEK AVIVA PLUS) test strip  Has the patient contacted their pharmacy? Yes.   (His prescription has expired  Preferred Pharmacy (with phone number or street name): [id number] [address], [place] AT [facility] Has the patient been seen for an appointment in the last year OR does the patient have an upcoming appointment? Yes.    Agent: Please be advised that RX refills may take up to 3 business days. We ask that you follow-up with your pharmacy.

## 2021-10-31 MED ORDER — ACCU-CHEK AVIVA PLUS VI STRP: ORAL_STRIP | 4 refills | Status: DC

## 2021-10-31 NOTE — Telephone Encounter (Signed)
Requested Prescriptions  Pending Prescriptions Disp Refills  . glucose blood (ACCU-CHEK AVIVA PLUS) test strip 100 each 4    Sig: Use to check blood sugar once a day for type 2 diabetes     Endocrinology: Diabetes - Testing Supplies Passed - 10/30/2021  8:09 AM      Passed - Valid encounter within last 12 months    Recent Outpatient Visits          2 days ago Hyperlipidemia associated with type 2 diabetes mellitus [facility])   [facility] Tally Joe T, FNP   3 months ago Poorly controlled diabetes mellitus ([place])   [place], Erin E, PA-C   9 months ago Poorly controlled diabetes mellitus [facility])   [facility] Birdie Sons, MD   1 year ago Poorly controlled diabetes mellitus [facility])   [facility] Birdie Sons, MD   1 year ago Hyperlipidemia associated with type 2 diabetes mellitus [facility])   [facility] [name], [place]      Future Appointments            In 2 months [name], [place], [place]

## 2021-12-08 ENCOUNTER — Other Ambulatory Visit: Payer: Self-pay | Admitting: Family Medicine

## 2021-12-08 DIAGNOSIS — K219 Gastro-esophageal reflux disease without esophagitis: Secondary | ICD-10-CM

## 2022-01-06 ENCOUNTER — Emergency Department

## 2022-01-06 ENCOUNTER — Other Ambulatory Visit: Payer: Self-pay

## 2022-01-06 ENCOUNTER — Encounter: Payer: Self-pay | Admitting: Emergency Medicine

## 2022-01-06 ENCOUNTER — Emergency Department
Admission: EM | Admit: 2022-01-06 | Discharge: 2022-01-06 | Disposition: A | Discharge status: Home / Self Care | Attending: Emergency Medicine | Admitting: Emergency Medicine

## 2022-01-06 DIAGNOSIS — S199XXA Unspecified injury of neck, initial encounter: Secondary | ICD-10-CM | POA: Diagnosis present

## 2022-01-06 DIAGNOSIS — E119 Type 2 diabetes mellitus without complications: Secondary | ICD-10-CM | POA: Insufficient documentation

## 2022-01-06 DIAGNOSIS — Y92410 Unspecified street and highway as the place of occurrence of the external cause: Secondary | ICD-10-CM | POA: Insufficient documentation

## 2022-01-06 DIAGNOSIS — M25512 Pain in left shoulder: Secondary | ICD-10-CM | POA: Insufficient documentation

## 2022-01-06 DIAGNOSIS — S161XXA Strain of muscle, fascia and tendon at neck level, initial encounter: Secondary | ICD-10-CM | POA: Insufficient documentation

## 2022-01-06 DIAGNOSIS — I10 Essential (primary) hypertension: Secondary | ICD-10-CM | POA: Diagnosis not present
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
## 2022-01-06 NOTE — Discharge Instructions (Signed)
Call make an appointment with your primary care provider if any continued problems or concerns.  Also have your blood pressure rechecked and take your blood pressure medication daily.  You may use ice or heat to your muscles as needed for discomfort and be aware that you may have soreness in other areas of your body that currently do not hurt.  You may take Tylenol or ibuprofen as needed for pain, soreness or stiffness.

## 2022-01-16 ENCOUNTER — Ambulatory Visit: Payer: Medicaid Other | Admitting: Podiatry

## 2022-01-19 DIAGNOSIS — E119 Type 2 diabetes mellitus without complications: Secondary | ICD-10-CM | POA: Diagnosis not present

## 2022-01-19 LAB — HM DIABETES EYE EXAM

## 2022-01-21 ENCOUNTER — Other Ambulatory Visit: Payer: Self-pay | Admitting: Physician Assistant

## 2022-01-21 DIAGNOSIS — I10 Essential (primary) hypertension: Secondary | ICD-10-CM

## 2022-01-21 NOTE — Telephone Encounter (Signed)
Requested Prescriptions  Pending Prescriptions Disp Refills  . metoprolol succinate (TOPROL-XL) 25 MG 24 hr tablet [Pharmacy Med Name: METOPROLOL ER SUCCINATE '25MG'$  TABS] 90 tablet 0    Sig: TAKE 1 TABLET(25 MG) BY MOUTH AT BEDTIME     Cardiovascular:  Beta Blockers Passed - 01/21/2022  6:30 AM      Passed - Last BP in normal range    BP Readings from Last 1 Encounters:  01/06/22 124/86         Passed - Last Heart Rate in normal range    Pulse Readings from Last 1 Encounters:  01/06/22 65         Passed - Valid encounter within last 6 months    Recent Outpatient Visits          2 months ago Hyperlipidemia associated with type 2 diabetes mellitus [facility])   [facility] Tally Joe T, FNP   5 months ago Poorly controlled diabetes mellitus ([place])   [place], Erin E, PA-C   1 year ago Poorly controlled diabetes mellitus [facility])   [facility] Birdie Sons, MD   1 year ago Poorly controlled diabetes mellitus [facility])   [facility] Birdie Sons, MD   1 year ago Hyperlipidemia associated with type 2 diabetes mellitus [facility])   [place], Wendee [place], [place]      Future Appointments            In 1 week [name], [place], [facility]

## 2022-01-22 NOTE — Progress Notes (Signed)
     Patient did not show for his appt; he later followed up for another in office appt.  [name], FNP, have reviewed all documentation for this visit. The documentation on 02/06/22 for the exam, diagnosis, procedures, and orders are all accurate and complete.  [name], [place] [phone] (phone) [phone] (fax)  [place]

## 2022-01-28 ENCOUNTER — Ambulatory Visit (INDEPENDENT_AMBULATORY_CARE_PROVIDER_SITE_OTHER): Payer: Medicaid Other | Admitting: Family Medicine

## 2022-01-28 DIAGNOSIS — Z91199 Patient's noncompliance with other medical treatment and regimen due to unspecified reason: Secondary | ICD-10-CM

## 2022-02-03 NOTE — Progress Notes (Unsigned)
Complete physical exam   Patient: [name] [name]   DOB: [birth year]   46 y.o. Male  MRN: [id number] Visit Date: 02/04/2022  Today's healthcare provider: [name], FNP  Re-Introduced to nurse practitioner role and practice setting.  All questions answered.  Discussed provider/patient relationship and expectations.   I, J ,acting as a scribe for [name], FNP.,have documented all relevant documentation on the behalf of [name], FNP,as directed by  [name], FNP while in the presence of [name], FNP.   Chief Complaint  Patient presents with   Annual Exam   Subjective    [name] [name] is a 46 y.o. male who presents today for a complete physical exam.  He reports consuming a diabetic, low carb/ calorie diet. Home exercise routine includes weightlifting and cardio. He generally feels well. He reports sleeping well. He does have additional problems to discuss today. Complains of L flank/back aching starting a while ago but worsening recently. HPI   Past Medical History:  Diagnosis Date   ADHD (attention deficit hyperactivity disorder)    Allergy    Arthritis    CAD (coronary artery disease)    S/P cath May 2012 with BMS to the LAD following abnormal stress test   Diabetes mellitus    Drug abuse ([place])    history of marijuana use and alcohol   GERD (gastroesophageal reflux disease)    Hypercholesterolemia    Hypertension    Myocardial infarction ([place])    Obesity    Oxygen deficiency    Sleep apnea    Past Surgical History:  Procedure Laterality Date   ABSCESS DRAINAGE     right arm; approx 2003   CARDIAC CATHETERIZATION     COLONOSCOPY WITH PROPOFOL N/A 10/10/2020   Procedure: COLONOSCOPY WITH PROPOFOL;  Surgeon: Jonathon Bellows, MD;  Location: [facility];  Service: Gastroenterology;  Laterality: N/A;   CORONARY ANGIOPLASTY     CORONARY STENT PLACEMENT  May 2012   LAD   Social History   Socioeconomic History   Marital status:  Married    Spouse name: Not on file   Number of children: 4   Years of education: Not on file   Highest education level: Not on file  Occupational History   Occupation: disabled  Tobacco Use   Smoking status: Former    Packs/day: 1.00    Years: 14.00    Total pack years: 14.00    Types: Cigarettes    Quit date: 06/2017    Years since quitting: 4.6   Smokeless tobacco: Never  Vaping Use   Vaping Use: Never used  Substance and Sexual Activity   Alcohol use: Yes    Alcohol/week: 10.0 standard drinks of alcohol    Types: 2 Cans of beer, 8 Shots of liquor per week    Comment: weekends   Drug use: Yes    Frequency: 20.0 times per week    Types: Marijuana   Sexual activity: Yes  Other Topics Concern   Not on file  Social History Narrative   Not on file   Social Determinants of Health   Financial Resource Strain: Not on file  Food Insecurity: Not on file  Transportation Needs: Not on file  Physical Activity: Not on file  Stress: Not on file  Social Connections: Not on file  Intimate Partner Violence: Not on file   Family Status  Relation Name Status   Mother 43 Alive   Father 55 Deceased  Sister 36 Alive   Pat Uncle  Deceased   PGM  Deceased   Family History  Problem Relation Age of Onset   Hypertension Mother    Diabetes Mother    Cancer Mother    Hypertension Father    Diabetes Father    Heart disease Father        cabg 3; 1995; 18 stents   Hypertension Sister    Heart disease Paternal Uncle    Heart disease Paternal Grandmother    Allergies  Allergen Reactions   Trulicity [Dulaglutide] Diarrhea and Nausea And Vomiting   Penicillins     Patient Care Team: [name], FNP as PCP - General (Family Medicine) End, Harrell Gave, MD as PCP - Cardiology (Cardiology) Himmelrich, Bryson Ha, RD (Inactive) as Dietitian Arvil Chaco, PA-C (Inactive) as Physician Assistant (Cardiology)   Medications: Outpatient Medications Prior to Visit  Medication  Sig   Accu-Chek Softclix Lancets lancets Use to check blood sugar daily for type 2 diabetes. E11.9   aspirin 81 MG tablet Take 81 mg by mouth daily.   Blood Glucose Monitoring Suppl (ACCU-CHEK AVIVA PLUS) w/Device KIT Use to check blood sugar daily for type 2 diabetes. E11.9   gabapentin (NEURONTIN) 300 MG capsule Take 1 capsule (300 mg total) by mouth 2 (two) times daily.   glucose blood (ACCU-CHEK AVIVA PLUS) test strip Use to check blood sugar once a day for type 2 diabetes   losartan (COZAAR) 25 MG tablet Take 1 tablet (25 mg total) by mouth daily.   nitroGLYCERIN (NITROSTAT) 0.4 MG SL tablet PLACE 1 TABLET UNDER THE TONGUE EVERY 5 MINUTES AS NEEDED FOR CHEST PAIN   rosuvastatin (CRESTOR) 40 MG tablet TAKE 1 TABLET(40 MG) BY MOUTH DAILY   [DISCONTINUED] amLODipine (NORVASC) 10 MG tablet TAKE 1 TABLET(10 MG) BY MOUTH DAILY   [DISCONTINUED] ezetimibe (ZETIA) 10 MG tablet Take 1 tablet (10 mg total) by mouth daily.   [DISCONTINUED] glipiZIDE (GLIPIZIDE XL) 5 MG 24 hr tablet Take 1 tablet (5 mg total) by mouth daily with breakfast.   [DISCONTINUED] metFORMIN (GLUCOPHAGE) 1000 MG tablet Take 1 tablet (1,000 mg total) by mouth 2 (two) times daily with a meal.   [DISCONTINUED] metoprolol succinate (TOPROL-XL) 25 MG 24 hr tablet TAKE 1 TABLET(25 MG) BY MOUTH AT BEDTIME   [DISCONTINUED] naproxen (NAPROSYN) 500 MG tablet Take 1 tablet (500 mg total) by mouth 2 (two) times daily with a meal. For shoulder neck and arm pain   [DISCONTINUED] pantoprazole (PROTONIX) 40 MG tablet TAKE 1 TABLET(40 MG) BY MOUTH DAILY   No facility-administered medications prior to visit.   Review of Systems  HENT:  Positive for sinus pressure.   Gastrointestinal:  Positive for anal bleeding.  Musculoskeletal:  Positive for neck pain.  Psychiatric/Behavioral:  Positive for decreased concentration. The patient is hyperactive.    Last CBC Lab Results  Component Value Date   WBC 7.1 08/01/2020   HGB 15.7 08/01/2020   HCT  44.8 08/01/2020   MCV 90.3 08/01/2020   MCH 31.7 08/01/2020   RDW 13.7 08/01/2020   PLT 281 11/57/2620   Last metabolic panel Lab Results  Component Value Date   GLUCOSE 151 (H) 08/01/2020   NA 138 08/01/2020   K 3.9 08/01/2020   CL 108 08/01/2020   CO2 20 (L) 08/01/2020   BUN 10 08/01/2020   CREATININE 0.80 08/01/2020   GFRNONAA >60 08/01/2020   CALCIUM 9.3 08/01/2020   PROT 8.1 08/01/2020   ALBUMIN 4.5 08/01/2020  LABGLOB 2.8 07/24/2020   AGRATIO 1.9 07/24/2020   BILITOT 0.8 08/01/2020   ALKPHOS 53 08/01/2020   AST 19 08/01/2020   ALT 33 08/01/2020   ANIONGAP 10 08/01/2020   Last lipids Lab Results  Component Value Date   CHOL 175 07/08/2020   HDL 41 07/08/2020   LDLCALC 97 07/08/2020   LDLDIRECT 129 (H) 06/24/2018   TRIG 217 (H) 07/08/2020   CHOLHDL 4.3 07/08/2020   Last hemoglobin A1c Lab Results  Component Value Date   HGBA1C 7.5 (A) 10/29/2021   Last thyroid functions Lab Results  Component Value Date   TSH 0.566 04/11/2018   Last vitamin D No results found for: "25OHVITD2", "25OHVITD3", "VD25OH" Last vitamin B12 and Folate No results found for: "VITAMINB12", "FOLATE"    Objective     BP 118/88 (BP Location: Right Arm, Patient Position: Sitting, Cuff Size: Large)   Pulse 74   Temp 97.6 F (36.4 C) (Oral)   Resp 16   Ht '6\' 3"'  (1.905 m)   Wt 273 lb (123.8 kg)   SpO2 100%   BMI 34.12 kg/m  BP Readings from Last 3 Encounters:  02/04/22 118/88  01/06/22 124/86  10/29/21 132/81   Wt Readings from Last 3 Encounters:  02/04/22 273 lb (123.8 kg)  01/06/22 270 lb (122.5 kg)  10/29/21 270 lb (122.5 kg)   SpO2 Readings from Last 3 Encounters:  02/04/22 100%  01/06/22 97%  10/17/21 99%       Physical Exam Vitals and nursing note reviewed.  Constitutional:      General: He is awake. He is not in acute distress.    Appearance: Normal appearance. He is well-developed and well-groomed. He is obese. He is not ill-appearing, toxic-appearing  or diaphoretic.  HENT:     Head: Normocephalic and atraumatic.     Jaw: There is normal jaw occlusion. No trismus, tenderness, swelling or pain on movement.     Salivary Glands: Right salivary gland is not diffusely enlarged or tender. Left salivary gland is not diffusely enlarged or tender.     Right Ear: Hearing, tympanic membrane, ear canal and external ear normal. There is no impacted cerumen.     Left Ear: Hearing, tympanic membrane, ear canal and external ear normal. There is no impacted cerumen.     Nose: Nose normal. No congestion or rhinorrhea.     Right Turbinates: Not enlarged, swollen or pale.     Left Turbinates: Not enlarged, swollen or pale.     Right Sinus: No maxillary sinus tenderness or frontal sinus tenderness.     Left Sinus: No maxillary sinus tenderness or frontal sinus tenderness.     Mouth/Throat:     Lips: Pink.     Mouth: Mucous membranes are moist. No injury, lacerations, oral lesions or angioedema.     Pharynx: Oropharynx is clear. Uvula midline. No pharyngeal swelling, oropharyngeal exudate or posterior oropharyngeal erythema.     Tonsils: No tonsillar exudate or tonsillar abscesses.  Eyes:     General: Lids are normal. Vision grossly intact. Gaze aligned appropriately.        Right eye: No discharge.        Left eye: No discharge.     Extraocular Movements: Extraocular movements intact.     Conjunctiva/sclera: Conjunctivae normal.     Pupils: Pupils are equal, round, and reactive to light.  Neck:     Thyroid: No thyroid mass, thyromegaly or thyroid tenderness.     Vascular: No carotid bruit.  Trachea: Trachea normal. No tracheal tenderness.  Cardiovascular:     Rate and Rhythm: Normal rate and regular rhythm.     Pulses: Normal pulses.          Carotid pulses are 2+ on the right side and 2+ on the left side.      Radial pulses are 2+ on the right side and 2+ on the left side.       Femoral pulses are 2+ on the right side and 2+ on the left side.       Popliteal pulses are 2+ on the right side and 2+ on the left side.       Dorsalis pedis pulses are 2+ on the right side and 2+ on the left side.       Posterior tibial pulses are 2+ on the right side and 2+ on the left side.     Heart sounds: Normal heart sounds, S1 normal and S2 normal. No murmur heard.    No friction rub. No gallop.  Pulmonary:     Effort: Pulmonary effort is normal. No respiratory distress.     Breath sounds: Normal breath sounds and air entry. No stridor. No wheezing, rhonchi or rales.  Chest:     Chest wall: No tenderness.  Abdominal:     General: Abdomen is flat. Bowel sounds are normal. There is no distension.     Palpations: Abdomen is soft. There is no mass.     Tenderness: There is no abdominal tenderness. There is no right CVA tenderness, left CVA tenderness, guarding or rebound.     Hernia: No hernia is present.     Comments: Report of L flank pain  Genitourinary:    Comments: Exam deferred; denies complaints Musculoskeletal:        General: No swelling, tenderness, deformity or signs of injury. Normal range of motion.     Cervical back: Normal range of motion and neck supple. No rigidity or tenderness.     Right lower leg: No edema.     Left lower leg: No edema.  Lymphadenopathy:     Cervical: No cervical adenopathy.     Right cervical: No superficial, deep or posterior cervical adenopathy.    Left cervical: No superficial, deep or posterior cervical adenopathy.  Skin:    General: Skin is warm and dry.     Capillary Refill: Capillary refill takes less than 2 seconds.     Coloration: Skin is not jaundiced or pale.     Findings: No bruising, erythema, lesion or rash.  Neurological:     General: No focal deficit present.     Mental Status: He is alert and oriented to person, place, and time. Mental status is at baseline.     GCS: GCS eye subscore is 4. GCS verbal subscore is 5. GCS motor subscore is 6.     Sensory: Sensation is intact. No sensory  deficit.     Motor: Motor function is intact. No weakness.     Coordination: Coordination is intact.     Gait: Gait is intact.  Psychiatric:        Attention and Perception: Attention and perception normal.        Mood and Affect: Mood and affect normal.        Speech: Speech normal.        Behavior: Behavior normal. Behavior is cooperative.        Thought Content: Thought content normal.  Cognition and Memory: Cognition normal.        Judgment: Judgment normal.     Last depression screening scores    02/04/2022   11:16 AM 10/29/2021   10:53 AM 10/14/2020   10:44 AM  PHQ 2/9 Scores  PHQ - 2 Score 0 0 4  PHQ- 9 Score '2 2 9   ' Last fall risk screening    02/04/2022   11:16 AM  [place] in the past year? 0  Number falls in past yr: 0  Injury with Fall? 0  Risk for fall due to : No Fall Risks  Follow up Falls evaluation completed   Last Audit-C alcohol use screening    02/04/2022   11:16 AM  Alcohol Use Disorder Test (AUDIT)  1. How often do you have a drink containing alcohol? 2  2. How many drinks containing alcohol do you have on a typical day when you are drinking? 2  3. How often do you have six or more drinks on one occasion? 1  AUDIT-C Score 5   A score of 3 or more in women, and 4 or more in men indicates increased risk for alcohol abuse, EXCEPT if all of the points are from question 1   No results found for any visits on 02/04/22.  Assessment & Plan    Routine Health Maintenance and Physical Exam  Exercise Activities and Dietary recommendations  Goals   None     Immunization History  Administered Date(s) Administered   Influenza,inj,Quad PF,6+ Mos 04/11/2018    Health Maintenance  Topic Date Due   COVID-19 Vaccine (1) Never done   Hepatitis C Screening  Never done   TETANUS/TDAP  Never done   OPHTHALMOLOGY EXAM  06/21/2021   INFLUENZA VACCINE  01/06/2022   HEMOGLOBIN A1C  05/01/2022   FOOT EXAM  07/31/2022   COLONOSCOPY (Pts  45-57yr Insurance coverage will need to be confirmed)  10/11/2027   HIV Screening  Completed   HPV VACCINES  Aged Out    Discussed health benefits of physical activity, and encouraged him to engage in regular exercise appropriate for his age and condition.  Problem List Items Addressed This Visit       Cardiovascular and Mediastinum   RESOLVED: Coronary artery disease involving native coronary artery of native heart without angina pectoris   Relevant Medications   amLODipine (NORVASC) 10 MG tablet   ezetimibe (ZETIA) 10 MG tablet   metoprolol succinate (TOPROL-XL) 25 MG 24 hr tablet   Hemorrhoids    Acute on chronic, reports slight intermittent blood noted with wiping Colon cancer screen due 2029 Encouraged to use fiber, water and foot stool to assist with colon alignment for BM Weight loss encouraged       Relevant Medications   amLODipine (NORVASC) 10 MG tablet   ezetimibe (ZETIA) 10 MG tablet   metoprolol succinate (TOPROL-XL) 25 MG 24 hr tablet   Hypertension associated with diabetes (HCC)    Chronic, stable; close to goal <130/<80 Denies CP Denies SOB/ DOE Denies low blood pressure/hypotension Denies vision changes No LE Edema noted on exam Continue medication, Norvasc 10 mg QD, Losartan 25 mg QD, Toprol XL 25 mg QD Denies side effects Seek emergent care if you develop chest pain or chest pressure       Relevant Medications   amLODipine (NORVASC) 10 MG tablet   ezetimibe (ZETIA) 10 MG tablet   glipiZIDE (GLIPIZIDE XL) 5 MG 24 hr tablet   metFORMIN (  GLUCOPHAGE) 1000 MG tablet   metoprolol succinate (TOPROL-XL) 25 MG 24 hr tablet   Other Relevant Orders   Comprehensive metabolic panel   CBC with Differential/Platelet     Digestive   RESOLVED: Gastroesophageal reflux disease   Relevant Medications   pantoprazole (PROTONIX) 40 MG tablet   Gastroesophageal reflux disease with esophagitis without hemorrhage    Chronic, waxes/wanes based on diet Continue PPI-  protonix 40 mg QD        Endocrine   Hyperlipidemia associated with type 2 diabetes mellitus (HCC)    Chronic, previously elevated LDL goal of 55-70 Currently on Crestor 40 mg QD Repeat Lipid panel      Relevant Medications   amLODipine (NORVASC) 10 MG tablet   ezetimibe (ZETIA) 10 MG tablet   glipiZIDE (GLIPIZIDE XL) 5 MG 24 hr tablet   metFORMIN (GLUCOPHAGE) 1000 MG tablet   metoprolol succinate (TOPROL-XL) 25 MG 24 hr tablet   Other Relevant Orders   Lipid panel   Type 2 diabetes mellitus with diabetic neuropathy, without long-term current use of insulin (HCC)    Chronic, previous not at goal with A1c of 7.5% Goal <7% Continue Glipizide 5 mg QD, Metformin 1000 mg BID Repeat A1c Continue to recommend balanced, lower carb meals. Smaller meal size, adding snacks. Choosing water as drink of choice and increasing purposeful exercise.       Relevant Medications   glipiZIDE (GLIPIZIDE XL) 5 MG 24 hr tablet   metFORMIN (GLUCOPHAGE) 1000 MG tablet   Other Relevant Orders   Hemoglobin A1c   Urine Microalbumin w/creat. ratio     Other   Annual physical exam - Primary    UTD on vision; due for dental Things to do to keep yourself healthy  - Exercise at least 30-45 minutes a day, 3-4 days a week.  - Eat a low-fat diet with lots of fruits and vegetables, up to 7-9 servings per day.  - Seatbelts can save your life. Wear them always.  - Smoke detectors on every level of your home, check batteries every year.  - Eye Doctor - have an eye exam every 1-2 years  - Safe sex - if you may be exposed to STDs, use a condom.  - Alcohol -  If you drink, do it moderately, less than 2 drinks per day.  - [place]. Choose someone to speak for you if you are not able.  - Depression is common in our stressful world.If you're feeling down or losing interest in things you normally enjoy, please come in for a visit.  - Violence - If anyone is threatening or hurting you, please  call immediately.        Relevant Orders   Comprehensive metabolic panel   CBC with Differential/Platelet   TSH + free T4   Lipid panel   Hemoglobin A1c   Hemoglobin A1c   Encounter for hepatitis C screening test for low risk patient    Low risk screen Treatable, and curable. If left untreated Hep C can lead to cirrhosis and liver failure. Encourage routine testing; recommend repeat testing if risk factors change.       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     [name], FNP, have reviewed all documentation for this visit. The documentation on 02/04/22 for the exam, diagnosis, procedures, and orders are all accurate and complete.    [name], [place] [phone] (phone) [phone] (fax)  [place]

## 2022-02-04 ENCOUNTER — Encounter: Payer: Self-pay | Admitting: Family Medicine

## 2022-02-04 ENCOUNTER — Ambulatory Visit (INDEPENDENT_AMBULATORY_CARE_PROVIDER_SITE_OTHER): Admitting: Family Medicine

## 2022-02-04 VITALS — BP 118/88 | HR 74 | Temp 97.6°F | Resp 16 | Ht 75.0 in | Wt 273.0 lb

## 2022-02-04 DIAGNOSIS — K2100 Gastro-esophageal reflux disease with esophagitis, without bleeding: Secondary | ICD-10-CM

## 2022-02-04 DIAGNOSIS — K649 Unspecified hemorrhoids: Secondary | ICD-10-CM | POA: Insufficient documentation

## 2022-02-04 DIAGNOSIS — E1140 Type 2 diabetes mellitus with diabetic neuropathy, unspecified: Secondary | ICD-10-CM | POA: Diagnosis not present

## 2022-02-04 DIAGNOSIS — Z0000 Encounter for general adult medical examination without abnormal findings: Secondary | ICD-10-CM | POA: Diagnosis not present

## 2022-02-04 DIAGNOSIS — R109 Unspecified abdominal pain: Secondary | ICD-10-CM | POA: Insufficient documentation

## 2022-02-04 DIAGNOSIS — I2510 Atherosclerotic heart disease of native coronary artery without angina pectoris: Secondary | ICD-10-CM

## 2022-02-04 DIAGNOSIS — I152 Hypertension secondary to endocrine disorders: Secondary | ICD-10-CM

## 2022-02-04 DIAGNOSIS — E1169 Type 2 diabetes mellitus with other specified complication: Secondary | ICD-10-CM

## 2022-02-04 DIAGNOSIS — E1159 Type 2 diabetes mellitus with other circulatory complications: Secondary | ICD-10-CM | POA: Diagnosis not present

## 2022-02-04 DIAGNOSIS — E785 Hyperlipidemia, unspecified: Secondary | ICD-10-CM

## 2022-02-04 DIAGNOSIS — I10 Essential (primary) hypertension: Secondary | ICD-10-CM | POA: Insufficient documentation

## 2022-02-04 DIAGNOSIS — F1911 Other psychoactive substance abuse, in remission: Secondary | ICD-10-CM | POA: Diagnosis not present

## 2022-02-04 DIAGNOSIS — K219 Gastro-esophageal reflux disease without esophagitis: Secondary | ICD-10-CM

## 2022-02-04 DIAGNOSIS — F1220 Cannabis dependence, uncomplicated: Secondary | ICD-10-CM | POA: Insufficient documentation

## 2022-02-04 DIAGNOSIS — Z1159 Encounter for screening for other viral diseases: Secondary | ICD-10-CM | POA: Insufficient documentation

## 2022-02-04 DIAGNOSIS — E1165 Type 2 diabetes mellitus with hyperglycemia: Secondary | ICD-10-CM | POA: Insufficient documentation

## 2022-02-04 MED ORDER — METFORMIN HCL 1000 MG PO TABS: 1000.0000 mg | ORAL_TABLET | Freq: Two times a day (BID) | ORAL | 1 refills | Status: DC

## 2022-02-04 MED ORDER — EZETIMIBE 10 MG PO TABS: 10.0000 mg | ORAL_TABLET | Freq: Every day | ORAL | 2 refills | Status: DC

## 2022-02-04 MED ORDER — METOPROLOL SUCCINATE ER 25 MG PO TB24: 25.0000 mg | ORAL_TABLET | Freq: Every day | ORAL | 3 refills | Status: DC

## 2022-02-04 MED ORDER — PANTOPRAZOLE SODIUM 40 MG PO TBEC: DELAYED_RELEASE_TABLET | ORAL | 3 refills | Status: DC

## 2022-02-04 MED ORDER — AMLODIPINE BESYLATE 10 MG PO TABS: ORAL_TABLET | ORAL | 2 refills | Status: DC

## 2022-02-04 MED ORDER — GLIPIZIDE ER 5 MG PO TB24: 5.0000 mg | ORAL_TABLET | Freq: Every day | ORAL | 3 refills | Status: DC

## 2022-02-04 NOTE — Assessment & Plan Note (Signed)
Low risk screen Treatable, and curable. If left untreated Hep C can lead to cirrhosis and liver failure. Encourage routine testing; recommend repeat testing if risk factors change.  

## 2022-02-04 NOTE — Assessment & Plan Note (Signed)
UTD on vision; due for dental Things to do to keep yourself healthy  - Exercise at least 30-45 minutes a day, 3-4 days a week.  - Eat a low-fat diet with lots of fruits and vegetables, up to 7-9 servings per day.  - Seatbelts can save your life. Wear them always.  - Smoke detectors on every level of your home, check batteries every year.  - Eye Doctor - have an eye exam every 1-2 years  - Safe sex - if you may be exposed to STDs, use a condom.  - Alcohol -  If you drink, do it moderately, less than 2 drinks per day.  - Health Care Power of Attorney. Choose someone to speak for you if you are not able.  - Depression is common in our stressful world.If you're feeling down or losing interest in things you normally enjoy, please come in for a visit.  - Violence - If anyone is threatening or hurting you, please call immediately.  

## 2022-02-04 NOTE — Assessment & Plan Note (Signed)
Chronic, previously elevated LDL goal of 55-70 Currently on Crestor 40 mg QD Repeat Lipid panel

## 2022-02-04 NOTE — Assessment & Plan Note (Signed)
Chronic, stable; close to goal <130/<80 Denies CP Denies SOB/ DOE Denies low blood pressure/hypotension Denies vision changes No LE Edema noted on exam Continue medication, Norvasc 10 mg QD, Losartan 25 mg QD, Toprol XL 25 mg QD Denies side effects Seek emergent care if you develop chest pain or chest pressure

## 2022-02-04 NOTE — Assessment & Plan Note (Signed)
Chronic, previous not at goal with A1c of 7.5% Goal <7% Continue Glipizide 5 mg QD, Metformin 1000 mg BID Repeat A1c Continue to recommend balanced, lower carb meals. Smaller meal size, adding snacks. Choosing water as drink of choice and increasing purposeful exercise.

## 2022-02-04 NOTE — Assessment & Plan Note (Signed)
Chronic, stable Continue to recommend heart healthy diet and exercise On ASA 81 mg QD Goal LDL of 55-70 with HTN and DM control, <130/<80, A1c <7% Followed by Cards annually

## 2022-02-04 NOTE — Assessment & Plan Note (Signed)
Acute, intermittent Negative workup on exam Check CMP and CBC and UA

## 2022-02-04 NOTE — Assessment & Plan Note (Signed)
Chronic, stable/slight improvement Reports MJ 1-2 times/day; previous report was noted at 20x/wk; however, pt reported "smoking all day long" when asked about frequency

## 2022-02-04 NOTE — Assessment & Plan Note (Signed)
Chronic, waxes/wanes based on diet Continue PPI- protonix 40 mg QD

## 2022-02-04 NOTE — Assessment & Plan Note (Signed)
History of drug/substance abuse; continues to remain clean/sober

## 2022-02-04 NOTE — Assessment & Plan Note (Signed)
Acute on chronic, reports slight intermittent blood noted with wiping Colon cancer screen due 2029 Encouraged to use fiber, water and foot stool to assist with colon alignment for BM Weight loss encouraged

## 2022-02-05 NOTE — Progress Notes (Signed)
Cholesterol is stable; outside of fat elevation. I recommend diet low in saturated fat and regular exercise - 30 min at least 5 times per week  A1c has increased back to uncontrolled range at 8.3%. Continue to recommend balanced, lower carb meals. Smaller meal size, adding snacks. Choosing water as drink of choice and increasing purposeful exercise. I recommend an increase of glipizide to twice a day with meals; also can add on a SGLT-1 inhibitor to assist with heart, kidney and glucose control if desired.  Blood chemistry is stable; slight elevation in liver enzyme, ALT has returned. This can be transient and can also come from recent NSAID use or alcohol intake.  Urine was normal; do not feel that L side abdominal pain is coming from infection.   All other labs normal and stable.  [name], [address] [place], [place] [address] [phone] (phone) [phone] (fax) [place]

## 2022-02-06 LAB — CBC WITH DIFFERENTIAL/PLATELET
Basophils Absolute: 0.1 10*3/uL (ref 0.0–0.2)
Basos: 1 %
EOS (ABSOLUTE): 0.1 10*3/uL (ref 0.0–0.4)
Eos: 2 %
Hematocrit: 40.3 % (ref 37.5–51.0)
Hemoglobin: 13.6 g/dL (ref 13.0–17.7)
Immature Grans (Abs): 0 10*3/uL (ref 0.0–0.1)
Immature Granulocytes: 0 %
Lymphocytes Absolute: 3.4 10*3/uL — ABNORMAL HIGH (ref 0.7–3.1)
Lymphs: 48 %
MCH: 30.4 pg (ref 26.6–33.0)
MCHC: 33.7 g/dL (ref 31.5–35.7)
MCV: 90 fL (ref 79–97)
Monocytes Absolute: 0.4 10*3/uL (ref 0.1–0.9)
Monocytes: 6 %
Neutrophils Absolute: 3 10*3/uL (ref 1.4–7.0)
Neutrophils: 43 %
Platelets: 250 10*3/uL (ref 150–450)
RBC: 4.48 x10E6/uL (ref 4.14–5.80)
RDW: 14.3 % (ref 11.6–15.4)
WBC: 7.1 10*3/uL (ref 3.4–10.8)

## 2022-02-06 LAB — MICROALBUMIN / CREATININE URINE RATIO
Creatinine, Urine: 157.1 mg/dL
Microalb/Creat Ratio: 6 mg/g creat (ref 0–29)
Microalbumin, Urine: 9.9 ug/mL

## 2022-02-06 LAB — URINALYSIS, ROUTINE W REFLEX MICROSCOPIC
Bilirubin, UA: NEGATIVE
Glucose, UA: NEGATIVE
Ketones, UA: NEGATIVE
Leukocytes,UA: NEGATIVE
Nitrite, UA: NEGATIVE
Protein,UA: NEGATIVE
RBC, UA: NEGATIVE
Specific Gravity, UA: 1.023 (ref 1.005–1.030)
Urobilinogen, Ur: 0.2 mg/dL (ref 0.2–1.0)
pH, UA: 5.5 (ref 5.0–7.5)

## 2022-02-06 LAB — COMPREHENSIVE METABOLIC PANEL
ALT: 55 IU/L — ABNORMAL HIGH (ref 0–44)
AST: 34 IU/L (ref 0–40)
Albumin/Globulin Ratio: 2 (ref 1.2–2.2)
Albumin: 4.7 g/dL (ref 4.1–5.1)
Alkaline Phosphatase: 72 IU/L (ref 44–121)
BUN/Creatinine Ratio: 9 (ref 9–20)
BUN: 9 mg/dL (ref 6–24)
Bilirubin Total: 0.3 mg/dL (ref 0.0–1.2)
CO2: 20 mmol/L (ref 20–29)
Calcium: 9.5 mg/dL (ref 8.7–10.2)
Chloride: 104 mmol/L (ref 96–106)
Creatinine, Ser: 1 mg/dL (ref 0.76–1.27)
Globulin, Total: 2.3 g/dL (ref 1.5–4.5)
Glucose: 184 mg/dL — ABNORMAL HIGH (ref 70–99)
Potassium: 4.7 mmol/L (ref 3.5–5.2)
Sodium: 141 mmol/L (ref 134–144)
Total Protein: 7 g/dL (ref 6.0–8.5)
eGFR: 95 mL/min/{1.73_m2} (ref >59)

## 2022-02-06 LAB — HEMOGLOBIN A1C
Est. average glucose Bld gHb Est-mCnc: 192 mg/dL
Hgb A1c MFr Bld: 8.3 % — ABNORMAL HIGH (ref 4.8–5.6)

## 2022-02-06 LAB — TSH+FREE T4
Free T4: 1.25 ng/dL (ref 0.82–1.77)
TSH: 0.705 u[IU]/mL (ref 0.450–4.500)

## 2022-02-06 LAB — LIPID PANEL
Chol/HDL Ratio: 3.2 ratio (ref 0.0–5.0)
Cholesterol, Total: 130 mg/dL (ref 100–199)
HDL: 41 mg/dL (ref >39)
LDL Chol Calc (NIH): 42 mg/dL (ref 0–99)
Triglycerides: 316 mg/dL — ABNORMAL HIGH (ref 0–149)
VLDL Cholesterol Cal: 47 mg/dL — ABNORMAL HIGH (ref 5–40)

## 2022-02-06 LAB — HEPATITIS C ANTIBODY: Hep C Virus Ab: NONREACTIVE

## 2022-02-06 NOTE — Progress Notes (Signed)
Stable urine micro; not clear if previously advised.  Repeat annually.  [name], [address] [place], [place] [address] [phone] (phone) [phone] (fax) [place]

## 2022-04-22 ENCOUNTER — Encounter: Payer: Self-pay | Admitting: Family Medicine

## 2022-04-22 ENCOUNTER — Ambulatory Visit (INDEPENDENT_AMBULATORY_CARE_PROVIDER_SITE_OTHER): Admitting: Family Medicine

## 2022-04-22 VITALS — BP 125/82 | HR 77 | Resp 16 | Ht 75.0 in | Wt 277.0 lb

## 2022-04-22 DIAGNOSIS — E1140 Type 2 diabetes mellitus with diabetic neuropathy, unspecified: Secondary | ICD-10-CM | POA: Diagnosis not present

## 2022-04-22 DIAGNOSIS — Z955 Presence of coronary angioplasty implant and graft: Secondary | ICD-10-CM | POA: Diagnosis not present

## 2022-04-22 DIAGNOSIS — M7592 Shoulder lesion, unspecified, left shoulder: Secondary | ICD-10-CM | POA: Insufficient documentation

## 2022-04-22 MED ORDER — NITROGLYCERIN 0.4 MG SL SUBL: 0.4000 mg | SUBLINGUAL_TABLET | SUBLINGUAL | 4 refills | Status: DC | PRN

## 2022-04-22 MED ORDER — BLOOD GLUCOSE METER KIT: PACK | 0 refills | Status: DC

## 2022-04-22 NOTE — Assessment & Plan Note (Signed)
Chronic, request for BG kit as previous machine does not have strips that are manufactured any longer Early for A1c check Continue to recommend balanced, lower carb meals. Smaller meal size, adding snacks. Choosing water as drink of choice and increasing purposeful exercise. Continue glipizide 5, metformin 1000 BID On ARB and statin

## 2022-04-22 NOTE — Progress Notes (Signed)
Established patient visit   Patient: [name] [name]   DOB: Jul 15, 1975   46 y.o. Male  MRN: [id number] Visit Date: 04/22/2022  Today's healthcare provider: [name], FNP  Re Introduced to nurse practitioner role and practice setting.  All questions answered.  Discussed provider/patient relationship and expectations.  I,Tiffany J Bragg,acting as a scribe for [name], FNP.,have documented all relevant documentation on the behalf of [name], FNP,as directed by  [name], FNP while in the presence of [name], FNP.   Chief Complaint  Patient presents with   Back Pain    Patient states he had a cold a couple weeks ago and was coughing a lot, now has L upper back/shoulder pain and wants to make sure it is muscular and not his heart. Also wants his nitro refilled.   Subjective    HPI HPI     Back Pain    Additional comments: Patient states he had a cold a couple weeks ago and was coughing a lot, now has L upper back/shoulder pain and wants to make sure it is muscular and not his heart. Also wants his nitro refilled.      Last edited by Smitty Knudsen, CMA on 04/22/2022  1:09 PM.       Medications: Outpatient Medications Prior to Visit  Medication Sig   Accu-Chek Softclix Lancets lancets Use to check blood sugar daily for type 2 diabetes. E11.9   amLODipine (NORVASC) 10 MG tablet TAKE 1 TABLET(10 MG) BY MOUTH DAILY   aspirin 81 MG tablet Take 81 mg by mouth daily.   Blood Glucose Monitoring Suppl (ACCU-CHEK AVIVA PLUS) w/Device KIT Use to check blood sugar daily for type 2 diabetes. E11.9   ezetimibe (ZETIA) 10 MG tablet Take 1 tablet (10 mg total) by mouth daily.   gabapentin (NEURONTIN) 300 MG capsule Take 1 capsule (300 mg total) by mouth 2 (two) times daily.   glipiZIDE (GLIPIZIDE XL) 5 MG 24 hr tablet Take 1 tablet (5 mg total) by mouth daily with breakfast.   glucose blood (ACCU-CHEK AVIVA PLUS) test strip Use to check blood sugar once a day  for type 2 diabetes   losartan (COZAAR) 25 MG tablet Take 1 tablet (25 mg total) by mouth daily.   metFORMIN (GLUCOPHAGE) 1000 MG tablet Take 1 tablet (1,000 mg total) by mouth 2 (two) times daily with a meal.   metoprolol succinate (TOPROL-XL) 25 MG 24 hr tablet Take 1 tablet (25 mg total) by mouth daily.   pantoprazole (PROTONIX) 40 MG tablet TAKE 1 TABLET(40 MG) BY MOUTH DAILY   rosuvastatin (CRESTOR) 40 MG tablet TAKE 1 TABLET(40 MG) BY MOUTH DAILY   [DISCONTINUED] nitroGLYCERIN (NITROSTAT) 0.4 MG SL tablet PLACE 1 TABLET UNDER THE TONGUE EVERY 5 MINUTES AS NEEDED FOR CHEST PAIN   No facility-administered medications prior to visit.    Review of Systems    Objective    BP 125/82 (BP Location: Right Arm, Patient Position: Sitting, Cuff Size: Large)   Pulse 77   Resp 16   Ht _0  (1.905 m)   Wt 277 lb (125.6 kg)   SpO2 100%   BMI 34.62 kg/m   Physical Exam Vitals and nursing note reviewed.  Constitutional:      Appearance: Normal appearance. He is obese.  HENT:     Head: Normocephalic and atraumatic.  Eyes:     Pupils: Pupils are equal, round, and reactive to light.  Cardiovascular:     Rate and Rhythm: Normal rate and regular rhythm.     Pulses: Normal pulses.     Heart sounds: Normal heart sounds.  Pulmonary:     Effort: Pulmonary effort is normal.     Breath sounds: Normal breath sounds.  Musculoskeletal:        General: Normal range of motion.       Arms:     Cervical back: Normal range of motion.  Skin:    General: Skin is warm and dry.     Capillary Refill: Capillary refill takes less than 2 seconds.  Neurological:     General: No focal deficit present.     Mental Status: He is alert and oriented to person, place, and time. Mental status is at baseline.  Psychiatric:        Mood and Affect: Mood normal.        Behavior: Behavior normal.        Thought Content: Thought content normal.        Judgment: Judgment normal.     No results found for any  visits on 04/22/22.  Assessment & Plan     Problem List Items Addressed This Visit       Endocrine   Type 2 diabetes mellitus with diabetic neuropathy, without long-term current use of insulin ([place]) - Primary    Chronic, request for BG kit as previous machine does not have strips that are manufactured any longer Early for A1c check Continue to recommend balanced, lower carb meals. Smaller meal size, adding snacks. Choosing water as drink of choice and increasing purposeful exercise. Continue glipizide 5, metformin 1000 BID On ARB and statin      Relevant Medications   blood glucose meter kit and supplies   Other Relevant Orders   Hemoglobin A1c     Musculoskeletal and Integument   Supraspinatus tendinitis, left    Acute, following recent URI with cough Encouraged to use massage, stretch, tennis ball rolling to assist with muscle relaxation        Other   History of coronary angioplasty with insertion of stent    Hx of BMS to LAD 11 years prior; request for SL nitro for anginal pain Has not used recently and previous Rx is exired       Relevant Medications   nitroGLYCERIN (NITROSTAT) 0.4 MG SL tablet   Return in about 4 months (around 08/21/2022) for chonic disease management.     [name], FNP, have reviewed all documentation for this visit. The documentation on 04/22/22 for the exam, diagnosis, procedures, and orders are all accurate and complete.  [name], [place] [phone] (phone) [phone] (fax)  [place]

## 2022-04-22 NOTE — Assessment & Plan Note (Signed)
Acute, following recent URI with cough Encouraged to use massage, stretch, tennis ball rolling to assist with muscle relaxation

## 2022-04-22 NOTE — Assessment & Plan Note (Signed)
Hx of BMS to LAD 11 years prior; request for SL nitro for anginal pain Has not used recently and previous Rx is exired

## 2022-05-07 ENCOUNTER — Ambulatory Visit: Admitting: Family Medicine

## 2022-05-12 NOTE — Progress Notes (Deleted)
      Established patient visit   Patient: Caprice Wasko   DOB: 08-14-1975   46 y.o. Male  MRN: [id number] Visit Date: 05/13/2022  Today's healthcare provider: [name], FNP   No chief complaint on file.  Subjective    HPI  ***  Medications: Outpatient Medications Prior to Visit  Medication Sig   Accu-Chek Softclix Lancets lancets Use to check blood sugar daily for type 2 diabetes. E11.9   amLODipine (NORVASC) 10 MG tablet TAKE 1 TABLET(10 MG) BY MOUTH DAILY   aspirin 81 MG tablet Take 81 mg by mouth daily.   blood glucose meter kit and supplies Dispense based on patient and insurance preference. Use daily. (FOR ICD-10 E10.9, E11.9).   Blood Glucose Monitoring Suppl (ACCU-CHEK AVIVA PLUS) w/Device KIT Use to check blood sugar daily for type 2 diabetes. E11.9   ezetimibe (ZETIA) 10 MG tablet Take 1 tablet (10 mg total) by mouth daily.   gabapentin (NEURONTIN) 300 MG capsule Take 1 capsule (300 mg total) by mouth 2 (two) times daily.   glipiZIDE (GLIPIZIDE XL) 5 MG 24 hr tablet Take 1 tablet (5 mg total) by mouth daily with breakfast.   glucose blood (ACCU-CHEK AVIVA PLUS) test strip Use to check blood sugar once a day for type 2 diabetes   losartan (COZAAR) 25 MG tablet Take 1 tablet (25 mg total) by mouth daily.   metFORMIN (GLUCOPHAGE) 1000 MG tablet Take 1 tablet (1,000 mg total) by mouth 2 (two) times daily with a meal.   metoprolol succinate (TOPROL-XL) 25 MG 24 hr tablet Take 1 tablet (25 mg total) by mouth daily.   nitroGLYCERIN (NITROSTAT) 0.4 MG SL tablet Place 1 tablet (0.4 mg total) under the tongue every 5 (five) minutes as needed for chest pain. Call 9-1-1 if you take 3 tablets.   pantoprazole (PROTONIX) 40 MG tablet TAKE 1 TABLET(40 MG) BY MOUTH DAILY   rosuvastatin (CRESTOR) 40 MG tablet TAKE 1 TABLET(40 MG) BY MOUTH DAILY   No facility-administered medications prior to visit.    Review of Systems  {Labs  Heme  Chem  Endocrine  Serology  Results Review  (optional):23779}   Objective    There were no vitals taken for this visit. {Show previous vital signs (optional):23777}  Physical Exam  ***  No results found for any visits on 05/13/22.  Assessment & Plan     ***  No follow-ups on file.      {provider attestation***:1}   [name], [place] [phone] (phone) [phone] (fax)  [place]

## 2022-05-13 ENCOUNTER — Ambulatory Visit: Admitting: Family Medicine

## 2022-06-23 ENCOUNTER — Other Ambulatory Visit: Payer: Self-pay | Admitting: Family Medicine

## 2022-06-23 DIAGNOSIS — E1165 Type 2 diabetes mellitus with hyperglycemia: Secondary | ICD-10-CM

## 2022-06-23 MED ORDER — ACCU-CHEK AVIVA PLUS VI STRP: ORAL_STRIP | 4 refills | Status: DC

## 2022-06-23 MED ORDER — ACCU-CHEK AVIVA PLUS W/DEVICE KIT: PACK | 1 refills | Status: DC

## 2022-06-23 MED ORDER — ACCU-CHEK SOFTCLIX LANCETS MISC: 4 refills | Status: DC

## 2022-06-23 NOTE — Telephone Encounter (Signed)
Requested Prescriptions  Pending Prescriptions Disp Refills   Blood Glucose Monitoring Suppl (ACCU-CHEK AVIVA PLUS) w/Device KIT 1 kit 1    Sig: Use to check blood sugar daily for type 2 diabetes. E11.9     Endocrinology: Diabetes - Testing Supplies Passed - 06/23/2022  5:21 PM      Passed - Valid encounter within last 12 months    Recent Outpatient Visits           2 months ago Type 2 diabetes mellitus with diabetic neuropathy, without long-term current use of insulin [facility])   [facility] [name], FNP   4 months ago Annual physical exam   [facility] [name], FNP   4 months ago No-show for appointment   [facility] Tally Joe T, FNP   7 months ago Hyperlipidemia associated with type 2 diabetes mellitus [facility])   [facility] Tally Joe T, FNP   10 months ago Poorly controlled diabetes mellitus ([place])   [place], Erin E, PA-C               glucose blood (ACCU-CHEK AVIVA PLUS) test strip 100 each 4    Sig: Use to check blood sugar once a day for type 2 diabetes     Endocrinology: Diabetes - Testing Supplies Passed - 06/23/2022  5:21 PM      Passed - Valid encounter within last 12 months    Recent Outpatient Visits           2 months ago Type 2 diabetes mellitus with diabetic neuropathy, without long-term current use of insulin [facility])   [facility] [name], FNP   4 months ago Annual physical exam   [facility] [name], FNP   4 months ago No-show for appointment   [facility] Tally Joe T, FNP   7 months ago Hyperlipidemia associated with type 2 diabetes mellitus [facility])   [facility] Tally Joe T, FNP   10 months ago Poorly controlled diabetes mellitus [facility])   [facility] M[place], Erin E, PA-C               Accu-Chek Softclix Lancets lancets 100 each 4    Sig: Use to check blood sugar  daily for type 2 diabetes. E11.9     Endocrinology: Diabetes - Testing Supplies Passed - 06/23/2022  5:21 PM      Passed - Valid encounter within last 12 months    Recent Outpatient Visits           2 months ago Type 2 diabetes mellitus with diabetic neuropathy, without long-term current use of insulin [facility])   [facility] [name], FNP   4 months ago Annual physical exam   [facility] [name], FNP   4 months ago No-show for appointment   [facility] Tally Joe T, FNP   7 months ago Hyperlipidemia associated with type 2 diabetes mellitus [facility])   [facility] Tally Joe T, FNP   10 months ago Poorly controlled diabetes mellitus [facility])   [place], Dani Gobble, PA-C

## 2022-06-23 NOTE — Telephone Encounter (Signed)
Pt called and is requesting a new blood glucose meter along with testing strips, does not know what the name is   [place], [place]  [address] [place] [address]  Phone: [phone] Fax: [phone]

## 2022-06-25 ENCOUNTER — Telehealth: Payer: Self-pay | Admitting: Family Medicine

## 2022-06-25 DIAGNOSIS — I152 Hypertension secondary to endocrine disorders: Secondary | ICD-10-CM

## 2022-06-25 DIAGNOSIS — Z955 Presence of coronary angioplasty implant and graft: Secondary | ICD-10-CM

## 2022-06-25 DIAGNOSIS — K2100 Gastro-esophageal reflux disease with esophagitis, without bleeding: Secondary | ICD-10-CM

## 2022-06-25 DIAGNOSIS — E1140 Type 2 diabetes mellitus with diabetic neuropathy, unspecified: Secondary | ICD-10-CM

## 2022-06-25 DIAGNOSIS — E1169 Type 2 diabetes mellitus with other specified complication: Secondary | ICD-10-CM

## 2022-06-25 DIAGNOSIS — I10 Essential (primary) hypertension: Secondary | ICD-10-CM

## 2022-06-25 NOTE — Telephone Encounter (Signed)
[facility] faxed refill request for the following medications:  nitroGLYCERIN (NITROSTAT) 0.4 MG SL tablet   ezetimibe (ZETIA) 10 MG tablet   metoprolol succinate (TOPROL-XL) 25 MG 24 hr tablet  rosuvastatin (CRESTOR) 40 MG tablet   metFORMIN (GLUCOPHAGE) 1000 MG tablet   amLODipine (NORVASC) 10 MG tablet  losartan (COZAAR) 25 MG tablet   pantoprazole (PROTONIX) 40 MG tablet   Please advise.

## 2022-06-26 MED ORDER — METOPROLOL SUCCINATE ER 25 MG PO TB24: 25.0000 mg | ORAL_TABLET | Freq: Every day | ORAL | 1 refills | Status: DC

## 2022-06-26 MED ORDER — METFORMIN HCL 1000 MG PO TABS: 1000.0000 mg | ORAL_TABLET | Freq: Two times a day (BID) | ORAL | 1 refills | Status: DC

## 2022-06-26 MED ORDER — PANTOPRAZOLE SODIUM 40 MG PO TBEC: DELAYED_RELEASE_TABLET | ORAL | 3 refills | Status: DC

## 2022-06-26 MED ORDER — ROSUVASTATIN CALCIUM 40 MG PO TABS: ORAL_TABLET | ORAL | 1 refills | Status: DC

## 2022-06-26 MED ORDER — NITROGLYCERIN 0.4 MG SL SUBL: 0.4000 mg | SUBLINGUAL_TABLET | SUBLINGUAL | 4 refills | Status: DC | PRN

## 2022-06-26 MED ORDER — AMLODIPINE BESYLATE 10 MG PO TABS: ORAL_TABLET | ORAL | 1 refills | Status: DC

## 2022-06-26 MED ORDER — LOSARTAN POTASSIUM 25 MG PO TABS: 25.0000 mg | ORAL_TABLET | Freq: Every day | ORAL | 1 refills | Status: DC

## 2022-06-26 MED ORDER — EZETIMIBE 10 MG PO TABS: 10.0000 mg | ORAL_TABLET | Freq: Every day | ORAL | 1 refills | Status: DC

## 2022-06-26 NOTE — Telephone Encounter (Signed)
Patient wants to use mail service and no longer use [facility] for the following medications. Rx sent

## 2022-07-06 ENCOUNTER — Other Ambulatory Visit: Payer: Self-pay | Admitting: Family Medicine

## 2022-07-06 MED ORDER — BLOOD GLUCOSE MONITORING SUPPL DEVI: 1.0000 | Freq: Three times a day (TID) | 0 refills | Status: AC

## 2022-07-06 MED ORDER — BLOOD GLUCOSE TEST VI STRP: 1.0000 | ORAL_STRIP | Freq: Two times a day (BID) | 3 refills | Status: AC

## 2022-07-06 MED ORDER — LANCET DEVICE MISC: 1.0000 | Freq: Two times a day (BID) | 3 refills | Status: AC | PRN

## 2022-09-23 NOTE — Progress Notes (Unsigned)
I,J'ya E ,acting as a scribe for Jacky Kindle, FNP.,have documented all relevant documentation on the behalf of Jacky Kindle, FNP,as directed by  Jacky Kindle, FNP while in the presence of Jacky Kindle, FNP.   Established patient visit  Patient: [name] [name]   DOB: June 14, 1975   47 y.o. Male  MRN: [id number] Visit Date: 09/24/2022  Today's healthcare provider: Jacky Kindle, FNP  Re Introduced to nurse practitioner role and practice setting.  All questions answered.  Discussed provider/patient relationship and expectations.  Chief Complaint  Patient presents with   Back Pain   Subjective    HPI  Back Pain  He reports chronic back pain. There  potentially  an injury that may have caused the pain. Patient works out frequently.  The most recent episode started in January 2024 and is gradually worsening. The pain is located in the right lumbar area, waist, or right gluteal areawith radiation, to the left thigh, to the right thigh. It is described as aching, sharp, stabbing, and throbbing, is 8/10 in intensity, occurring intermittently. Symptoms are worse in the: when he gets in his vehicle.   Aggravating factors: bending backwards, bending forwards, bending sideways, running, sitting, standing, and walking uphill Relieving factors: none.  He has tried NSAIDs and prescription pain relievers with mild relief.   Associated symptoms: No abdominal pain No bowel incontinence  No chest   Yes dysuria- per pt report  No fever No headaches  Yes joint pains Yes pelvic pain  Yes weakness in leg  Yes tingling in lower extremities  Yes urinary incontinence No weight loss- reports intentional    -----------------------------------------------------------------------------------------   Medications: Outpatient Medications Prior to Visit  Medication Sig   Accu-Chek Softclix Lancets lancets Use to check blood sugar daily for type 2 diabetes. E11.9   amLODipine (NORVASC) 10 MG tablet  TAKE 1 TABLET(10 MG) BY MOUTH DAILY   aspirin 81 MG tablet Take 81 mg by mouth daily.   blood glucose meter kit and supplies Dispense based on patient and insurance preference. Use daily. (FOR ICD-10 E10.9, E11.9).   Blood Glucose Monitoring Suppl DEVI 1 each by Does not apply route in the morning, at noon, and at bedtime. May substitute to any manufacturer covered by patient's insurance.   ezetimibe (ZETIA) 10 MG tablet Take 1 tablet (10 mg total) by mouth daily.   gabapentin (NEURONTIN) 300 MG capsule Take 1 capsule (300 mg total) by mouth 2 (two) times daily.   glipiZIDE (GLIPIZIDE XL) 5 MG 24 hr tablet Take 1 tablet (5 mg total) by mouth daily with breakfast.   Lancet Device MISC 1 each by Does not apply route 2 (two) times daily as needed. May substitute to any manufacturer covered by patient's insurance.   losartan (COZAAR) 25 MG tablet Take 1 tablet (25 mg total) by mouth daily.   metFORMIN (GLUCOPHAGE) 1000 MG tablet Take 1 tablet (1,000 mg total) by mouth 2 (two) times daily with a meal.   metoprolol succinate (TOPROL-XL) 25 MG 24 hr tablet Take 1 tablet (25 mg total) by mouth daily.   nitroGLYCERIN (NITROSTAT) 0.4 MG SL tablet Place 1 tablet (0.4 mg total) under the tongue every 5 (five) minutes as needed for chest pain. Call 9-1-1 if you take 3 tablets.   pantoprazole (PROTONIX) 40 MG tablet TAKE 1 TABLET(40 MG) BY MOUTH DAILY   rosuvastatin (CRESTOR) 40 MG tablet TAKE 1 TABLET(40 MG) BY MOUTH DAILY   No facility-administered medications prior to  visit.    Review of Systems      Objective    BP 94/69 (BP Location: Right Arm, Patient Position: Sitting, Cuff Size: Large)   Pulse 65   Temp 98.1 F (36.7 C) (Oral)   Ht 6\' 3"  (1.905 m)   Wt 247 lb 14.4 oz (112.4 kg)   SpO2 100%   BMI 30.99 kg/m     Physical Exam Vitals and nursing note reviewed. Exam conducted with a chaperone present.  Constitutional:      Appearance: Normal appearance. He is obese. He is ill-appearing.   HENT:     Head: Normocephalic and atraumatic.  Eyes:     Pupils: Pupils are equal, round, and reactive to light.  Cardiovascular:     Rate and Rhythm: Normal rate and regular rhythm.     Pulses: Normal pulses.     Heart sounds: Normal heart sounds.  Pulmonary:     Effort: Pulmonary effort is normal.     Breath sounds: Normal breath sounds.  Abdominal:     General: Bowel sounds are normal.     Palpations: Abdomen is soft.     Tenderness: There is abdominal tenderness. There is guarding. There is no right CVA tenderness or left CVA tenderness.  Genitourinary:    Pubic Area: No rash.      Penis: Normal.      Testes:        Right: Tenderness present.        Left: Tenderness present.     Epididymis:     Right: Tenderness present.     Left: Tenderness present.     Tanner stage (genital): 5.     Prostate: Normal.     Rectum: Normal.  Musculoskeletal:        General: Normal range of motion.     Cervical back: Normal range of motion.  Skin:    General: Skin is warm and dry.     Capillary Refill: Capillary refill takes less than 2 seconds.  Neurological:     General: No focal deficit present.     Mental Status: He is alert and oriented to person, place, and time. Mental status is at baseline.  Psychiatric:        Mood and Affect: Mood normal.        Behavior: Behavior normal.        Thought Content: Thought content normal.   No results found for any visits on 09/24/22.  Assessment & Plan     Problem List Items Addressed This Visit       Cardiovascular and Mediastinum   Hypertension associated with diabetes    Chronic, well controlled; slightly low May benefit from decrease in norvasc from 10 to 5 mg  Remains on asa 81 mg as well as losartan 25 mg      Relevant Orders   CBC with Differential/Platelet   Comprehensive Metabolic Panel (CMET)     Endocrine   Hyperlipidemia associated with type 2 diabetes mellitus    Chronic, previously well controlled Repeat LP On  crestor 40 mg       Relevant Orders   Lipid panel   Uncontrolled diabetes mellitus with hypoglycemia - Primary    Previous uncontrolled with reports of both low BG levels and vision changes Reports increase in abdominal pain, and has been strict with diet and exercise On metformin 1000 mg bid and glipizide 5 mg  Continue to recommend balanced, lower carb meals. Smaller meal size, adding snacks. Choosing  water as drink of choice and increasing purposeful exercise. Repeat A1c and urine micro       Relevant Orders   Hemoglobin A1c     Genitourinary   Kidney stone on right side    No mass palpated; pt reports pain into back and scrotum and legs Normal exam Unclear etiology Concern for possible kidney stone Start PRN pain medications and flomax; push fluids UA and urine cytology completed  Full labs Referral to ortho Discussed emergent care needs      Relevant Medications   traMADol (ULTRAM) 50 MG tablet   Other Relevant Orders   Ambulatory referral to Orthopedics     Other   Chronic RLQ pain    No mass palpated; pt reports pain into back and scrotum and legs Normal exam Unclear etiology Concern for possible kidney stone Start PRN pain medications and flomax Full labs Referral to ortho Discussed emergent care needs      Relevant Medications   traMADol (ULTRAM) 50 MG tablet   Other Relevant Orders   Ambulatory referral to Orthopedics   Scrotal pain    No mass palpated; pt reports pain into back and scrotum and legs Normal exam Unclear etiology Concern for possible kidney stone Start PRN pain medications and flomax Full labs Referral to ortho Discussed emergent care needs      Relevant Orders   Urine cytology ancillary only   Urinalysis, Routine w reflex microscopic   PSA   Hepatitis C Antibody   HIV antibody (with reflex)   RPR   Ambulatory referral to Orthopedics   Return if symptoms worsen or fail to improve.     Leilani Merl, FNP, have reviewed  all documentation for this visit. The documentation on 09/24/22 for the exam, diagnosis, procedures, and orders are all accurate and complete.  Jacky Kindle, FNP  [facility] [phone] (phone) [phone] (fax)  [facility]

## 2022-09-24 ENCOUNTER — Ambulatory Visit (INDEPENDENT_AMBULATORY_CARE_PROVIDER_SITE_OTHER): Admitting: Family Medicine

## 2022-09-24 ENCOUNTER — Encounter: Payer: Self-pay | Admitting: Family Medicine

## 2022-09-24 ENCOUNTER — Other Ambulatory Visit: Payer: Self-pay | Admitting: Family Medicine

## 2022-09-24 ENCOUNTER — Other Ambulatory Visit (HOSPITAL_COMMUNITY)
Admission: RE | Admit: 2022-09-24 | Discharge: 2022-09-24 | Disposition: A | Discharge status: Home / Self Care | Source: Ambulatory Visit | Attending: Family Medicine | Admitting: Family Medicine

## 2022-09-24 VITALS — BP 94/69 | HR 65 | Temp 98.1°F | Ht 75.0 in | Wt 247.9 lb

## 2022-09-24 DIAGNOSIS — G8929 Other chronic pain: Secondary | ICD-10-CM | POA: Diagnosis not present

## 2022-09-24 DIAGNOSIS — E1169 Type 2 diabetes mellitus with other specified complication: Secondary | ICD-10-CM

## 2022-09-24 DIAGNOSIS — R1031 Right lower quadrant pain: Secondary | ICD-10-CM

## 2022-09-24 DIAGNOSIS — E13649 Other specified diabetes mellitus with hypoglycemia without coma: Secondary | ICD-10-CM

## 2022-09-24 DIAGNOSIS — N5082 Scrotal pain: Secondary | ICD-10-CM | POA: Diagnosis not present

## 2022-09-24 DIAGNOSIS — E1159 Type 2 diabetes mellitus with other circulatory complications: Secondary | ICD-10-CM | POA: Diagnosis not present

## 2022-09-24 DIAGNOSIS — E11649 Type 2 diabetes mellitus with hypoglycemia without coma: Secondary | ICD-10-CM | POA: Insufficient documentation

## 2022-09-24 DIAGNOSIS — E785 Hyperlipidemia, unspecified: Secondary | ICD-10-CM

## 2022-09-24 DIAGNOSIS — I152 Hypertension secondary to endocrine disorders: Secondary | ICD-10-CM

## 2022-09-24 DIAGNOSIS — N200 Calculus of kidney: Secondary | ICD-10-CM

## 2022-09-24 MED ORDER — TRAMADOL HCL 50 MG PO TABS: 100.0000 mg | ORAL_TABLET | Freq: Four times a day (QID) | ORAL | 0 refills | Status: AC | PRN

## 2022-09-24 MED ORDER — TAMSULOSIN HCL 0.4 MG PO CAPS: 0.8000 mg | ORAL_CAPSULE | Freq: Every day | ORAL | 0 refills | Status: DC

## 2022-09-24 NOTE — Assessment & Plan Note (Signed)
No mass palpated; pt reports pain into back and scrotum and legs Normal exam Unclear etiology Concern for possible kidney stone Start PRN pain medications and flomax; push fluids UA and urine cytology completed  Full labs Referral to ortho Discussed emergent care needs

## 2022-09-24 NOTE — Assessment & Plan Note (Signed)
No mass palpated; pt reports pain into back and scrotum and legs Normal exam Unclear etiology Concern for possible kidney stone Start PRN pain medications and flomax Full labs Referral to ortho Discussed emergent care needs 

## 2022-09-24 NOTE — Assessment & Plan Note (Signed)
Chronic, well controlled; slightly low May benefit from decrease in norvasc from 10 to 5 mg  Remains on asa 81 mg as well as losartan 25 mg

## 2022-09-24 NOTE — Assessment & Plan Note (Signed)
Previous uncontrolled with reports of both low BG levels and vision changes Reports increase in abdominal pain, and has been strict with diet and exercise On metformin 1000 mg bid and glipizide 5 mg  Continue to recommend balanced, lower carb meals. Smaller meal size, adding snacks. Choosing water as drink of choice and increasing purposeful exercise. Repeat A1c and urine micro

## 2022-09-24 NOTE — Patient Instructions (Signed)
Seek walk in clinic at [facility] vs wait for appt at [facility] in town for concern for R abdominal pain; acute on chronic. Labs will come back tomorrow.  Use pain meds and bladder medication to assist given concern for kidney stone

## 2022-09-24 NOTE — Assessment & Plan Note (Signed)
Chronic, previously well controlled Repeat LP On crestor 40 mg

## 2022-09-24 NOTE — Assessment & Plan Note (Signed)
No mass palpated; pt reports pain into back and scrotum and legs Normal exam Unclear etiology Concern for possible kidney stone Start PRN pain medications and flomax Full labs Referral to ortho Discussed emergent care needs

## 2022-09-25 LAB — COMPREHENSIVE METABOLIC PANEL
ALT: 27 IU/L (ref 0–44)
AST: 19 IU/L (ref 0–40)
Albumin/Globulin Ratio: 1.9 (ref 1.2–2.2)
Albumin: 4.5 g/dL (ref 4.1–5.1)
Alkaline Phosphatase: 81 IU/L (ref 44–121)
BUN/Creatinine Ratio: 9 (ref 9–20)
BUN: 8 mg/dL (ref 6–24)
Bilirubin Total: 0.3 mg/dL (ref 0.0–1.2)
CO2: 22 mmol/L (ref 20–29)
Calcium: 10.1 mg/dL (ref 8.7–10.2)
Chloride: 101 mmol/L (ref 96–106)
Creatinine, Ser: 0.91 mg/dL (ref 0.76–1.27)
Globulin, Total: 2.4 g/dL (ref 1.5–4.5)
Glucose: 214 mg/dL — ABNORMAL HIGH (ref 70–99)
Potassium: 4.4 mmol/L (ref 3.5–5.2)
Sodium: 138 mmol/L (ref 134–144)
Total Protein: 6.9 g/dL (ref 6.0–8.5)
eGFR: 106 mL/min/{1.73_m2} (ref >59)

## 2022-09-25 LAB — CBC WITH DIFFERENTIAL/PLATELET
Basophils Absolute: 0 10*3/uL (ref 0.0–0.2)
Basos: 1 %
EOS (ABSOLUTE): 0.1 10*3/uL (ref 0.0–0.4)
Eos: 1 %
Hematocrit: 41.5 % (ref 37.5–51.0)
Hemoglobin: 13.8 g/dL (ref 13.0–17.7)
Immature Grans (Abs): 0 10*3/uL (ref 0.0–0.1)
Immature Granulocytes: 0 %
Lymphocytes Absolute: 3 10*3/uL (ref 0.7–3.1)
Lymphs: 46 %
MCH: 30.3 pg (ref 26.6–33.0)
MCHC: 33.3 g/dL (ref 31.5–35.7)
MCV: 91 fL (ref 79–97)
Monocytes Absolute: 0.4 10*3/uL (ref 0.1–0.9)
Monocytes: 7 %
Neutrophils Absolute: 2.8 10*3/uL (ref 1.4–7.0)
Neutrophils: 45 %
Platelets: 232 10*3/uL (ref 150–450)
RBC: 4.56 x10E6/uL (ref 4.14–5.80)
RDW: 13.1 % (ref 11.6–15.4)
WBC: 6.3 10*3/uL (ref 3.4–10.8)

## 2022-09-25 LAB — HEPATITIS C ANTIBODY: Hep C Virus Ab: NONREACTIVE

## 2022-09-25 LAB — URINALYSIS, ROUTINE W REFLEX MICROSCOPIC
Bilirubin, UA: NEGATIVE
Ketones, UA: NEGATIVE
Leukocytes,UA: NEGATIVE
Nitrite, UA: NEGATIVE
Protein,UA: NEGATIVE
RBC, UA: NEGATIVE
Specific Gravity, UA: 1.021 (ref 1.005–1.030)
Urobilinogen, Ur: 0.2 mg/dL (ref 0.2–1.0)
pH, UA: 5.5 (ref 5.0–7.5)

## 2022-09-25 LAB — LIPID PANEL
Chol/HDL Ratio: 3.5 ratio (ref 0.0–5.0)
Cholesterol, Total: 125 mg/dL (ref 100–199)
HDL: 36 mg/dL — ABNORMAL LOW (ref >39)
LDL Chol Calc (NIH): 63 mg/dL (ref 0–99)
Triglycerides: 149 mg/dL (ref 0–149)
VLDL Cholesterol Cal: 26 mg/dL (ref 5–40)

## 2022-09-25 LAB — HIV ANTIBODY (ROUTINE TESTING W REFLEX): HIV Screen 4th Generation wRfx: NONREACTIVE

## 2022-09-25 LAB — PSA: Prostate Specific Ag, Serum: 0.8 ng/mL (ref 0.0–4.0)

## 2022-09-25 LAB — RPR: RPR Ser Ql: NONREACTIVE

## 2022-09-25 LAB — HEMOGLOBIN A1C
Est. average glucose Bld gHb Est-mCnc: 315 mg/dL
Hgb A1c MFr Bld: 12.6 % — ABNORMAL HIGH (ref 4.8–5.6)

## 2022-09-27 NOTE — Progress Notes (Signed)
Labs stable with exception of uncontrolled diabetes mellitus. If you have been taking your glipizide we can increase to twice daily dosing in addition to your metformin. We can also start a low dose once daily long acting insulin to also assist in blood sugar control.

## 2022-09-28 ENCOUNTER — Other Ambulatory Visit: Payer: Self-pay | Admitting: Family Medicine

## 2022-09-28 DIAGNOSIS — E13649 Other specified diabetes mellitus with hypoglycemia without coma: Secondary | ICD-10-CM

## 2022-09-28 DIAGNOSIS — R109 Unspecified abdominal pain: Secondary | ICD-10-CM

## 2022-09-28 LAB — URINE CYTOLOGY ANCILLARY ONLY
Bacterial Vaginitis-Urine: NEGATIVE
Candida Urine: NEGATIVE
Chlamydia: NEGATIVE
Comment: NEGATIVE
Comment: NEGATIVE
Comment: NORMAL
Neisseria Gonorrhea: NEGATIVE
Trichomonas: NEGATIVE

## 2022-09-28 NOTE — Progress Notes (Signed)
Negative urine cytology.

## 2022-10-01 ENCOUNTER — Ambulatory Visit: Payer: Self-pay | Admitting: *Deleted

## 2022-10-01 ENCOUNTER — Telehealth: Payer: Self-pay | Admitting: Family Medicine

## 2022-10-01 NOTE — Telephone Encounter (Signed)
Second attempt to contact patient's wife- left message to call office

## 2022-10-01 NOTE — Telephone Encounter (Signed)
Summary: Can't tolerate metFORMIN, side effects   The spouse of the patient called in stating the patient has had major problems taking his metFORMIN (GLUCOPHAGE) 1000 MG tablet . He has been having , stomach, back and groin pain along with rapid weight loss and weightlessness. The spouse states this has already been discussed with his provider and she knows he has been having problems. Is there a different medicine he could use instead that may have a lot less side effects. Please assist patient further as he uses  [facility] STORE [phone] [place], [place] - [address] AT [facility] Phone: [phone] Fax: [phone]   For immediate needs

## 2022-10-01 NOTE — Telephone Encounter (Signed)
Third attempt to reach patient/wife- called wife's contact number left- no answer- left message. Called patient contact number- no answer- message left.

## 2022-10-06 ENCOUNTER — Telehealth: Payer: Self-pay

## 2022-10-06 NOTE — Telephone Encounter (Signed)
Copied from CRM [id number]. Topic: General - Inquiry >> Oct 06, 2022  9:06 AM [place] wrote: Reason for CRM: The patient's wife called, requesting to schedule an appointment with Dr.Fisher. Stated they only wanted to see Dr.Fisher. Per request scheduled for 10/07/22. Pt wife was transferred to NT as well.   See the encounter from 04/25.   FYI to the office.

## 2022-10-06 NOTE — Telephone Encounter (Signed)
  Chief Complaint: medication concern- patient requesting change Symptoms: uncontrolled glucose Frequency: patient was seen in office 4/18- diabetes uncontrolled, referrals were made for ortho and nutrition( wife states he does not need nutrition referral)   Disposition: [] ED /[] Urgent Care (no appt availability in office) / [x] Appointment(In office/virtual)/ []  [facility]/ [] Home Care/ [] Refused Recommended Disposition /[] [facility]/ []  Follow-up with PCP Additional Notes: Wife states patient will only see Dr Sherrie Mustache- agent has scheduled an appointment tomorrow- advised Dr Sherrie Mustache is not accepting NP- offered sooner appointment- but declined. Advised if patient pain gets worse- UC/ED.   Reason for Disposition  [1] Caller has URGENT medicine question about med that PCP or specialist prescribed AND [2] triager unable to answer question  Answer Assessment - Initial Assessment Questions 1. NAME of MEDICINE: "What medicine(s) are you calling about?"     Metformin- years 2. QUESTION: "What is your question?" (e.g., double dose of medicine, side effect)     Patient is requesting change in medication  3. PRESCRIBER: "Who prescribed the medicine?" Reason: if prescribed by specialist, call should be referred to that group.     PCP 4. SYMPTOMS: "Do you have any symptoms?" If Yes, ask: "What symptoms are you having?"  "How bad are the symptoms (e.g., mild, moderate, severe)     Agitation, not sleeping, glucose not controlled- 300's, abdominal pain, back pain  Patient's wife is upset there has been no follow up- they were expecting call back regarding change of medication request. Patient has been given an appointment in am to see Dr Sherrie Mustache- patient wants to see him and change provider to him.  Patient has not heard from [facility]- patient states he has severe pain- I confirmed the referral has been sent. They are going to reach out to schedule an appointment. They requested  follow up on this as well. I did offer to make a sooner appointment with another provider- but patient only wants to see Dr Sherrie Mustache.  Protocols used: Medication Question Call-A-AH

## 2022-10-06 NOTE — Progress Notes (Unsigned)
      Established patient visit   Patient: [name] [name]   DOB: 1975/10/20   47 y.o. Male  MRN: [id number] Visit Date: 10/07/2022  Today's healthcare provider: [name], MD   No chief complaint on file.  Subjective    HPI  ***  Medications: Outpatient Medications Prior to Visit  Medication Sig   Accu-Chek Softclix Lancets lancets Use to check blood sugar daily for type 2 diabetes. E11.9   amLODipine (NORVASC) 10 MG tablet TAKE 1 TABLET(10 MG) BY MOUTH DAILY   aspirin 81 MG tablet Take 81 mg by mouth daily.   blood glucose meter kit and supplies Dispense based on patient and insurance preference. Use daily. (FOR ICD-10 E10.9, E11.9).   Blood Glucose Monitoring Suppl DEVI 1 each by Does not apply route in the morning, at noon, and at bedtime. May substitute to any manufacturer covered by patient's insurance.   ezetimibe (ZETIA) 10 MG tablet Take 1 tablet (10 mg total) by mouth daily.   gabapentin (NEURONTIN) 300 MG capsule Take 1 capsule (300 mg total) by mouth 2 (two) times daily.   glipiZIDE (GLIPIZIDE XL) 5 MG 24 hr tablet Take 1 tablet (5 mg total) by mouth daily with breakfast.   Lancet Device MISC 1 each by Does not apply route 2 (two) times daily as needed. May substitute to any manufacturer covered by patient's insurance.   losartan (COZAAR) 25 MG tablet Take 1 tablet (25 mg total) by mouth daily.   metFORMIN (GLUCOPHAGE) 1000 MG tablet Take 1 tablet (1,000 mg total) by mouth 2 (two) times daily with a meal.   metoprolol succinate (TOPROL-XL) 25 MG 24 hr tablet Take 1 tablet (25 mg total) by mouth daily.   nitroGLYCERIN (NITROSTAT) 0.4 MG SL tablet Place 1 tablet (0.4 mg total) under the tongue every 5 (five) minutes as needed for chest pain. Call 9-1-1 if you take 3 tablets.   pantoprazole (PROTONIX) 40 MG tablet TAKE 1 TABLET(40 MG) BY MOUTH DAILY   rosuvastatin (CRESTOR) 40 MG tablet TAKE 1 TABLET(40 MG) BY MOUTH DAILY   tamsulosin (FLOMAX) 0.4 MG CAPS capsule Take 2  capsules (0.8 mg total) by mouth daily.   No facility-administered medications prior to visit.    Review of Systems  {Labs  Heme  Chem  Endocrine  Serology  Results Review (optional):23779}   Objective    There were no vitals taken for this visit. {Show previous vital signs (optional):23777}  Physical Exam  ***  No results found for any visits on 10/07/22.  Assessment & Plan     ***  No follow-ups on file.      {provider attestation***:1}   [name], MD  [facility] [phone] (phone) [phone] (fax)  [facility]

## 2022-10-07 ENCOUNTER — Ambulatory Visit (INDEPENDENT_AMBULATORY_CARE_PROVIDER_SITE_OTHER): Admitting: Family Medicine

## 2022-10-07 VITALS — BP 110/77 | HR 69 | Temp 97.8°F | Wt 247.0 lb

## 2022-10-07 DIAGNOSIS — I152 Hypertension secondary to endocrine disorders: Secondary | ICD-10-CM | POA: Diagnosis not present

## 2022-10-07 DIAGNOSIS — E1140 Type 2 diabetes mellitus with diabetic neuropathy, unspecified: Secondary | ICD-10-CM | POA: Diagnosis not present

## 2022-10-07 DIAGNOSIS — E1159 Type 2 diabetes mellitus with other circulatory complications: Secondary | ICD-10-CM

## 2022-10-07 DIAGNOSIS — M545 Low back pain, unspecified: Secondary | ICD-10-CM | POA: Diagnosis not present

## 2022-10-07 MED ORDER — ONETOUCH VERIO VI STRP
ORAL_STRIP | 12 refills | Status: DC
Start: 2022-10-07 — End: 2023-11-07

## 2022-10-07 MED ORDER — GLIPIZIDE ER 5 MG PO TB24
5.0000 mg | ORAL_TABLET | Freq: Two times a day (BID) | ORAL | 1 refills | Status: DC
Start: 2022-10-07 — End: 2023-01-13

## 2022-10-07 MED ORDER — SITAGLIPTIN PHOSPHATE 100 MG PO TABS
100.0000 mg | ORAL_TABLET | Freq: Every day | ORAL | 1 refills | Status: DC
Start: 2022-10-07 — End: 2022-12-07

## 2022-10-08 ENCOUNTER — Telehealth: Payer: Self-pay | Admitting: Family Medicine

## 2022-10-08 NOTE — Telephone Encounter (Signed)
[facility] pharmacy faxed refill request for the following medications:  Accu-Chek Softclix Lancets lancets    Please Advise

## 2022-10-08 NOTE — Telephone Encounter (Signed)
Called patient to double check and reports that he has lancets and what he needs are the strips. Refilled was sent yesterday.  glucose blood (ONETOUCH VERIO) test strip 100 each 12 10/07/2022    Sig: Use as instructed   Sent to pharmacy as: glucose blood (ONETOUCH VERIO) test strip   Notes to Pharmacy: Use to check blood sugar three times a day for type 2 diabetes   E-Prescribing Status: Receipt confirmed by pharmacy (10/07/2022  9:52 AM EDT)

## 2022-10-10 ENCOUNTER — Other Ambulatory Visit: Payer: Self-pay | Admitting: Family Medicine

## 2022-10-10 DIAGNOSIS — M5416 Radiculopathy, lumbar region: Secondary | ICD-10-CM

## 2022-10-17 ENCOUNTER — Ambulatory Visit
Admission: RE | Admit: 2022-10-17 | Discharge: 2022-10-17 | Disposition: A | Discharge status: Home / Self Care | Source: Ambulatory Visit | Attending: Family Medicine | Admitting: Family Medicine

## 2022-10-17 DIAGNOSIS — M5416 Radiculopathy, lumbar region: Secondary | ICD-10-CM

## 2022-10-17 DIAGNOSIS — M545 Low back pain, unspecified: Secondary | ICD-10-CM | POA: Diagnosis not present

## 2022-10-24 ENCOUNTER — Other Ambulatory Visit: Payer: Self-pay | Admitting: Physician Assistant

## 2022-10-24 DIAGNOSIS — I10 Essential (primary) hypertension: Secondary | ICD-10-CM

## 2022-10-24 DIAGNOSIS — I152 Hypertension secondary to endocrine disorders: Secondary | ICD-10-CM

## 2022-10-26 NOTE — Telephone Encounter (Signed)
Patient has changed pharmacy- using mail order now-  Rx 06/26/22 #90 1RF- too soon Requested Prescriptions  Pending Prescriptions Disp Refills   losartan (COZAAR) 25 MG tablet [Pharmacy Med Name: LOSARTAN 25MG  TABLETS] 90 tablet 1    Sig: TAKE 1 TABLET(25 MG) BY MOUTH DAILY     Cardiovascular:  Angiotensin Receptor Blockers Passed - 10/24/2022  6:29 AM      Passed - Cr in normal range and within 180 days    Creat  Date Value Ref Range Status  10/13/2010 0.78 0.40 - 1.50 mg/dL Final   Creatinine, Ser  Date Value Ref Range Status  09/24/2022 0.91 0.76 - 1.27 mg/dL Final         Passed - K in normal range and within 180 days    Potassium  Date Value Ref Range Status  09/24/2022 4.4 3.5 - 5.2 mmol/L Final         Passed - Patient is not pregnant      Passed - Last BP in normal range    BP Readings from Last 1 Encounters:  10/07/22 110/77         Passed - Valid encounter within last 6 months    Recent Outpatient Visits           2 weeks ago Type 2 diabetes mellitus with diabetic neuropathy, without long-term current use of insulin (HCC)   [facility] Malva Limes, MD   1 month ago Uncontrolled diabetes mellitus of other type with hypoglycemia, unspecified hypoglycemia coma status (HCC)   [facility] Merita Norton T, FNP   6 months ago Type 2 diabetes mellitus with diabetic neuropathy, without long-term current use of insulin [facility])   [facility] Merita Norton T, FNP   8 months ago Annual physical exam   [facility] Jacky Kindle, FNP   9 months ago No-show for appointment   [facility] Jacky Kindle, FNP       Future Appointments             In 1 week Sherrie Mustache, Demetrios Isaacs, MD [facility], [facility]   In 2 months [name], MD [facility]             amLODipine (NORVASC)  10 MG tablet [Pharmacy Med Name: AMLODIPINE BESYLATE 10MG  TABLETS] 90 tablet 1    Sig: TAKE 1 TABLET(10 MG) BY MOUTH DAILY     Cardiovascular: Calcium Channel Blockers 2 Passed - 10/24/2022  6:29 AM      Passed - Last BP in normal range    BP Readings from Last 1 Encounters:  10/07/22 110/77         Passed - Last Heart Rate in normal range    Pulse Readings from Last 1 Encounters:  10/07/22 69         Passed - Valid encounter within last 6 months    Recent Outpatient Visits           2 weeks ago Type 2 diabetes mellitus with diabetic neuropathy, without long-term current use of insulin (HCC)   [place] [facility] Malva Limes, MD   1 month ago Uncontrolled diabetes mellitus of other type with hypoglycemia, unspecified hypoglycemia coma status [facility])   [facility] Merita Norton T, FNP   6 months ago Type 2 diabetes mellitus with diabetic neuropathy,  without long-term current use of insulin [facility])   [facility] Jacky Kindle, FNP   8 months ago Annual physical exam   [facility] Jacky Kindle, FNP   9 months ago No-show for appointment   [facility] Jacky Kindle, FNP       Future Appointments             In 1 week Fisher, Demetrios Isaacs, MD [facility], [facility]   In 2 months [name], MD [facility]

## 2022-10-27 NOTE — Progress Notes (Unsigned)
I,Sha'taria ,acting as a Neurosurgeon for Jacky Kindle, FNP.,have documented all relevant documentation on the behalf of Jacky Kindle, FNP,as directed by  Jacky Kindle, FNP while in the presence of Jacky Kindle, FNP.   Established patient visit  Patient: [name] [name]   DOB: 20-Mar-1976   47 y.o. Male  MRN: [id number] Visit Date: 10/28/2022  Today's healthcare provider: Jacky Kindle, FNP  Re Introduced to nurse practitioner role and practice setting.  All questions answered.  Discussed provider/patient relationship and expectations.   No chief complaint on file.  Subjective    HPI  Diabetes Mellitus Type II, Follow-up  Lab Results  Component Value Date   HGBA1C 12.3 (A) 10/28/2022   HGBA1C 12.6 (H) 09/24/2022   HGBA1C 8.3 (H) 02/04/2022   Wt Readings from Last 3 Encounters:  10/28/22 252 lb (114.3 kg)  10/07/22 247 lb (112 kg)  09/24/22 247 lb 14.4 oz (112.4 kg)   Last seen for diabetes 3 weeks ago.  Management since then includes double glipiZIDE (GLIPIZIDE XL) 5 MG 24 hr tablet to take 1 tablet (5 mg total) by mouth 2 (two) times daily with a meal and add sitaGLIPtin (JANUVIA) 100 MG tablet. He reports excellent compliance with treatment. He is not having side effects.  Symptoms: Yes fatigue No foot ulcerations  No appetite changes No nausea  No paresthesia of the feet  No polydipsia  No polyuria No visual disturbances   No vomiting     Home blood sugar records:  140-160  Episodes of hypoglycemia? Yes {hen first started med changes; nausea and vomiting   Current insulin regiment:   Pertinent Labs: Lab Results  Component Value Date   CHOL 125 09/24/2022   HDL 36 (L) 09/24/2022   LDLCALC 63 09/24/2022   LDLDIRECT 129 (H) 06/24/2018   TRIG 149 09/24/2022   CHOLHDL 3.5 09/24/2022   Lab Results  Component Value Date   NA 138 09/24/2022   K 4.4 09/24/2022   CREATININE 0.91 09/24/2022   EGFR 106 09/24/2022   LABMICR Comment 09/24/2022   MICRALBCREAT 6  02/04/2022     ---------------------------------------------------------------------------------------------------   Medications: Outpatient Medications Prior to Visit  Medication Sig   Accu-Chek Softclix Lancets lancets Use to check blood sugar daily for type 2 diabetes. E11.9   amLODipine (NORVASC) 10 MG tablet TAKE 1 TABLET(10 MG) BY MOUTH DAILY   aspirin 81 MG tablet Take 81 mg by mouth daily.   blood glucose meter kit and supplies Dispense based on patient and insurance preference. Use daily. (FOR ICD-10 E10.9, E11.9).   Blood Glucose Monitoring Suppl DEVI 1 each by Does not apply route in the morning, at noon, and at bedtime. May substitute to any manufacturer covered by patient's insurance.   ezetimibe (ZETIA) 10 MG tablet Take 1 tablet (10 mg total) by mouth daily.   glipiZIDE (GLIPIZIDE XL) 5 MG 24 hr tablet Take 1 tablet (5 mg total) by mouth 2 (two) times daily with a meal.   glucose blood (ONETOUCH VERIO) test strip Use as instructed   Lancet Device MISC 1 each by Does not apply route 2 (two) times daily as needed. May substitute to any manufacturer covered by patient's insurance.   losartan (COZAAR) 25 MG tablet Take 1 tablet (25 mg total) by mouth daily.   methocarbamol (ROBAXIN) 750 MG tablet    metoprolol succinate (TOPROL-XL) 25 MG 24 hr tablet Take 1 tablet (25 mg total) by mouth daily.   nitroGLYCERIN (NITROSTAT)  0.4 MG SL tablet Place 1 tablet (0.4 mg total) under the tongue every 5 (five) minutes as needed for chest pain. Call 9-1-1 if you take 3 tablets.   pantoprazole (PROTONIX) 40 MG tablet TAKE 1 TABLET(40 MG) BY MOUTH DAILY   rosuvastatin (CRESTOR) 40 MG tablet TAKE 1 TABLET(40 MG) BY MOUTH DAILY   sitaGLIPtin (JANUVIA) 100 MG tablet Take 1 tablet (100 mg total) by mouth daily.   tamsulosin (FLOMAX) 0.4 MG CAPS capsule Take 2 capsules (0.8 mg total) by mouth daily.   [DISCONTINUED] gabapentin (NEURONTIN) 300 MG capsule Take 1 capsule (300 mg total) by mouth 2 (two)  times daily.   [DISCONTINUED] HYDROcodone-acetaminophen (NORCO/VICODIN) 5-325 MG tablet    No facility-administered medications prior to visit.    Review of Systems  Last CBC Lab Results  Component Value Date   WBC 6.3 09/24/2022   HGB 13.8 09/24/2022   HCT 41.5 09/24/2022   MCV 91 09/24/2022   MCH 30.3 09/24/2022   RDW 13.1 09/24/2022   PLT 232 09/24/2022   Last metabolic panel Lab Results  Component Value Date   GLUCOSE 214 (H) 09/24/2022   NA 138 09/24/2022   K 4.4 09/24/2022   CL 101 09/24/2022   CO2 22 09/24/2022   BUN 8 09/24/2022   CREATININE 0.91 09/24/2022   EGFR 106 09/24/2022   CALCIUM 10.1 09/24/2022   PROT 6.9 09/24/2022   ALBUMIN 4.5 09/24/2022   LABGLOB 2.4 09/24/2022   AGRATIO 1.9 09/24/2022   BILITOT 0.3 09/24/2022   ALKPHOS 81 09/24/2022   AST 19 09/24/2022   ALT 27 09/24/2022   ANIONGAP 10 08/01/2020   Last lipids Lab Results  Component Value Date   CHOL 125 09/24/2022   HDL 36 (L) 09/24/2022   LDLCALC 63 09/24/2022   LDLDIRECT 129 (H) 06/24/2018   TRIG 149 09/24/2022   CHOLHDL 3.5 09/24/2022   Last hemoglobin A1c Lab Results  Component Value Date   HGBA1C 12.3 (A) 10/28/2022       Objective    BP (!) 122/90 (BP Location: Left Arm, Patient Position: Sitting, Cuff Size: Normal)   Pulse 67   Ht 6\' 3"  (1.905 m)   Wt 252 lb (114.3 kg)   BMI 31.50 kg/m  BP Readings from Last 3 Encounters:  10/28/22 (!) 122/90  10/07/22 110/77  09/24/22 94/69   Wt Readings from Last 3 Encounters:  10/28/22 252 lb (114.3 kg)  10/07/22 247 lb (112 kg)  09/24/22 247 lb 14.4 oz (112.4 kg)   SpO2 Readings from Last 3 Encounters:  10/07/22 98%  09/24/22 100%  04/22/22 100%      Physical Exam Vitals and nursing note reviewed.  Constitutional:      Appearance: Normal appearance. He is obese.  HENT:     Head: Normocephalic and atraumatic.  Cardiovascular:     Rate and Rhythm: Normal rate and regular rhythm.     Pulses: Normal pulses.      Heart sounds: Normal heart sounds.  Pulmonary:     Effort: Pulmonary effort is normal.     Breath sounds: Normal breath sounds.  Musculoskeletal:        General: Normal range of motion.     Cervical back: Normal range of motion.  Skin:    General: Skin is warm and dry.     Capillary Refill: Capillary refill takes less than 2 seconds.  Neurological:     General: No focal deficit present.     Mental Status: He is  alert and oriented to person, place, and time. Mental status is at baseline.  Psychiatric:        Mood and Affect: Mood normal.        Behavior: Behavior normal.        Thought Content: Thought content normal.        Judgment: Judgment normal.     Results for orders placed or performed in visit on 10/28/22  POCT HgB A1C  Result Value Ref Range   Hemoglobin A1C 12.3 (A) 4.0 - 5.6 %   HbA1c POC (<> result, manual entry)     HbA1c, POC (prediabetic range)     HbA1c, POC (controlled diabetic range)      Assessment & Plan     Problem List Items Addressed This Visit       Cardiovascular and Mediastinum   Hypertension associated with diabetes (HCC)    Chronic, remains elevated; however, patient rocking in pain today from DDD in back Continue diet/exercise as tolerated and medication to assist in meeting goal of 119/79 or less Continue: norvasc 10 mg, losartan 25 mg, toprol 25 mg  A1c remains uncontrolled at >12% Continue to recommend balanced, lower carb meals. Smaller meal size, adding snacks. Choosing water as drink of choice and increasing purposeful exercise. Has upcoming appt with nutrition Recommend start of long acting insulin to assist; encourage f/u as needed for further titration       Relevant Medications   insulin degludec (TRESIBA) 100 UNIT/ML FlexTouch Pen   Other Relevant Orders   AMB Referral to Pharmacy Medication Management     Endocrine   Type 2 diabetes mellitus with diabetic neuropathy, without long-term current use of insulin (HCC) - Primary    Relevant Medications   Insulin Pen Needle (PEN NEEDLES 5/16") 30G X 8 MM MISC   insulin degludec (TRESIBA) 100 UNIT/ML FlexTouch Pen     Musculoskeletal and Integument   DDD (degenerative disc disease), lumbosacral    Chronic, worsening Request for additional pain medication, repeat A1c and POC update to allow patient to get steroid injection at later date with pain mgmt Will refill at this time; PDMP reviewed Continue stretching, APAP, ice to assist Gaba increased as well to 600 mg TID      Relevant Medications   methocarbamol (ROBAXIN) 750 MG tablet   gabapentin (NEURONTIN) 600 MG tablet   HYDROcodone-acetaminophen (NORCO) 5-325 MG tablet   Other Relevant Orders   AMB Referral to Pharmacy Medication Management   Return in about 1 week (around 11/04/2022) for medication administration, nurse follow up.     Leilani Merl, FNP, have reviewed all documentation for this visit. The documentation on 10/28/22 for the exam, diagnosis, procedures, and orders are all accurate and complete.  Jacky Kindle, FNP  [facility] [phone] (phone) [phone] (fax)  [facility]

## 2022-10-28 ENCOUNTER — Ambulatory Visit (INDEPENDENT_AMBULATORY_CARE_PROVIDER_SITE_OTHER): Admitting: Family Medicine

## 2022-10-28 ENCOUNTER — Other Ambulatory Visit: Payer: Self-pay | Admitting: Family Medicine

## 2022-10-28 ENCOUNTER — Encounter: Payer: Self-pay | Admitting: Family Medicine

## 2022-10-28 VITALS — BP 122/90 | HR 67 | Ht 75.0 in | Wt 252.0 lb

## 2022-10-28 DIAGNOSIS — E1159 Type 2 diabetes mellitus with other circulatory complications: Secondary | ICD-10-CM | POA: Diagnosis not present

## 2022-10-28 DIAGNOSIS — E1140 Type 2 diabetes mellitus with diabetic neuropathy, unspecified: Secondary | ICD-10-CM | POA: Diagnosis not present

## 2022-10-28 DIAGNOSIS — M51379 Other intervertebral disc degeneration, lumbosacral region without mention of lumbar back pain or lower extremity pain: Secondary | ICD-10-CM | POA: Insufficient documentation

## 2022-10-28 DIAGNOSIS — Z794 Long term (current) use of insulin: Secondary | ICD-10-CM | POA: Diagnosis not present

## 2022-10-28 DIAGNOSIS — M5137 Other intervertebral disc degeneration, lumbosacral region: Secondary | ICD-10-CM | POA: Diagnosis not present

## 2022-10-28 DIAGNOSIS — I152 Hypertension secondary to endocrine disorders: Secondary | ICD-10-CM

## 2022-10-28 LAB — POCT GLYCOSYLATED HEMOGLOBIN (HGB A1C): Hemoglobin A1C: 12.3 % — AB (ref 4.0–5.6)

## 2022-10-28 MED ORDER — INSULIN DEGLUDEC 100 UNIT/ML ~~LOC~~ SOPN: 15.0000 [IU] | PEN_INJECTOR | Freq: Every day | SUBCUTANEOUS | 0 refills | Status: DC

## 2022-10-28 MED ORDER — INSULIN DEGLUDEC 100 UNIT/ML ~~LOC~~ SOPN
14.0000 [IU] | PEN_INJECTOR | Freq: Every day | SUBCUTANEOUS | 0 refills | Status: DC
Start: 2022-10-28 — End: 2022-10-29

## 2022-10-28 MED ORDER — HYDROCODONE-ACETAMINOPHEN 5-325 MG PO TABS
2.0000 | ORAL_TABLET | ORAL | 0 refills | Status: DC
Start: 2022-10-28 — End: 2022-11-30

## 2022-10-28 MED ORDER — GABAPENTIN 600 MG PO TABS
600.0000 mg | ORAL_TABLET | Freq: Three times a day (TID) | ORAL | 0 refills | Status: DC
Start: 2022-10-28 — End: 2023-01-12

## 2022-10-28 MED ORDER — PEN NEEDLES 5/16" 30G X 8 MM MISC
100.0000 | Freq: Every evening | 3 refills | Status: AC
Start: 2022-10-28 — End: ?

## 2022-10-28 NOTE — Assessment & Plan Note (Addendum)
Chronic, worsening Request for additional pain medication, repeat A1c and POC update to allow patient to get steroid injection at later date with pain mgmt Will refill at this time; PDMP reviewed Continue stretching, APAP, ice to assist Gaba increased as well to 600 mg TID

## 2022-10-28 NOTE — Assessment & Plan Note (Addendum)
Chronic, remains elevated; however, patient rocking in pain today from DDD in back Continue diet/exercise as tolerated and medication to assist in meeting goal of 119/79 or less Continue: norvasc 10 mg, losartan 25 mg, toprol 25 mg  A1c remains uncontrolled at >12% Continue to recommend balanced, lower carb meals. Smaller meal size, adding snacks. Choosing water as drink of choice and increasing purposeful exercise. Has upcoming appt with nutrition Recommend start of long acting insulin to assist; encourage f/u as needed for further titration

## 2022-10-28 NOTE — Telephone Encounter (Signed)
Requested medication (s) are due for refill today: Yes  Requested medication (s) are on the active medication list: Yes  Last refill:  09/24/22  Future visit scheduled:   Notes to clinic:  Requesting 90 day supply.    Requested Prescriptions  Pending Prescriptions Disp Refills   tamsulosin (FLOMAX) 0.4 MG CAPS capsule [Pharmacy Med Name: TAMSULOSIN 0.4MG  CAPSULES] 180 capsule     Sig: TAKE 2 CAPSULES(0.8 MG) BY MOUTH DAILY     Urology: Alpha-Adrenergic Blocker Passed - 10/28/2022 10:11 AM      Passed - PSA in normal range and within 360 days    Prostate Specific Ag, Serum  Date Value Ref Range Status  09/24/2022 0.8 0.0 - 4.0 ng/mL Final    Comment:    Roche ECLIA methodology. According to the American Urological Association, Serum PSA should decrease and remain at undetectable levels after radical prostatectomy. The AUA defines biochemical recurrence as an initial PSA value 0.2 ng/mL or greater followed by a subsequent confirmatory PSA value 0.2 ng/mL or greater. Values obtained with different assay methods or kits cannot be used interchangeably. Results cannot be interpreted as absolute evidence of the presence or absence of malignant disease.          Passed - Last BP in normal range    BP Readings from Last 1 Encounters:  10/28/22 (!) 122/90         Passed - Valid encounter within last 12 months    Recent Outpatient Visits           Today Type 2 diabetes mellitus with diabetic neuropathy, with long-term current use of insulin [facility])   [place] [facility] Merita Norton T, FNP   3 weeks ago Type 2 diabetes mellitus with diabetic neuropathy, without long-term current use of insulin [facility])   [place] [facility] Malva Limes, MD   1 month ago Uncontrolled diabetes mellitus of other type with hypoglycemia, unspecified hypoglycemia coma status [facility])   [place] Merita Norton T, FNP   6 months ago Type 2  diabetes mellitus with diabetic neuropathy, without long-term current use of insulin [facility])   [place] [facility] Jacky Kindle, FNP   8 months ago Annual physical exam   [facility] Jacky Kindle, FNP       Future Appointments             In 1 week Fisher, Demetrios Isaacs, MD [facility], [facility]   In 2 months [name], MD [facility] [place] [facility]

## 2022-10-29 ENCOUNTER — Other Ambulatory Visit: Payer: Self-pay | Admitting: Family Medicine

## 2022-10-29 ENCOUNTER — Other Ambulatory Visit: Payer: Self-pay

## 2022-10-29 DIAGNOSIS — E1140 Type 2 diabetes mellitus with diabetic neuropathy, unspecified: Secondary | ICD-10-CM

## 2022-10-29 MED ORDER — INSULIN GLARGINE 100 UNITS/ML SOLOSTAR PEN: PEN_INJECTOR | SUBCUTANEOUS | 11 refills | Status: DC

## 2022-10-29 MED ORDER — LANTUS SOLOSTAR 100 UNIT/ML ~~LOC~~ SOPN: PEN_INJECTOR | SUBCUTANEOUS | 11 refills | Status: DC

## 2022-10-29 NOTE — Progress Notes (Signed)
Rx for insulin sent to local pharmacy

## 2022-10-30 ENCOUNTER — Ambulatory Visit: Admitting: Skilled Nursing Facility1

## 2022-10-30 ENCOUNTER — Other Ambulatory Visit: Admitting: Pharmacist

## 2022-10-30 NOTE — Progress Notes (Unsigned)
10/30/2022 Name: Ladanian Guzowski MRN: [id number] DOB: 01-24-1976  Chief Complaint  Patient presents with   Medication Management    Djon Hartzog is a 47 y.o. year old male who presented for a telephone visit.   They were referred to the pharmacist by their PCP for assistance in managing diabetes and medication access.   Patient is participating in a Managed Medicaid Plan:  Yes, Engineer, civil (consulting) (Note patient also has Tricare coverage)  Subjective:  Care Team: Primary Care Provider: Jacky Kindle, FNP ; Next Scheduled Visit with office: 11/06/2022 Gastroenterology: [name], MD; Next Scheduled Visit: 01/04/2023 Dietician: Altamese Cabal, RD; Initial Visit Scheduled for: 11/12/2022  Medication Access/Adherence  Current Pharmacy:  [facility] - [place], [place] - [address] [place] [address] Phone: [phone] Fax: [phone]  [id number] [place], [place] - [address] AT [facility] [address] [place] [address] Phone: [phone] Fax: [phone]   Patient reports affordability concerns with their medications: Yes  Patient reports access/transportation concerns to their pharmacy: No  Patient reports adherence concerns with their medications:  No    Reach patient by telephone only briefly today. Patient states that he is having difficulty with picking up his Lantus insulin prescription due to the cost and requests assistance.  Patient requests to reschedule the rest of our visit because he is at the barbershop this morning.   Diabetes:  Current medications:  - glipizide ER 5 mg twice daily (reports had been taking only once daily) - Januvia 100 mg daily - Lantus insulin - 16 units nightly (reports started yesterday from sample provided by PCP) Note PCP has advised patient: check fasting blood sugar daily. Titrate dose every 3 days by 2 units to meet fasting blood sugar  of 110-120   Medications tried in the past: metformin, Victoza, Trulicity (GI side effects)  Reports fasting blood sugar this morning: 240 (but reports skipped supper last night and then ate large portion of rice late in evening)  Objective:  Lab Results  Component Value Date   HGBA1C 12.3 (A) 10/28/2022    Lab Results  Component Value Date   CREATININE 0.91 09/24/2022   BUN 8 09/24/2022   NA 138 09/24/2022   K 4.4 09/24/2022   CL 101 09/24/2022   CO2 22 09/24/2022    Lab Results  Component Value Date   CHOL 125 09/24/2022   HDL 36 (L) 09/24/2022   LDLCALC 63 09/24/2022   LDLDIRECT 129 (H) 06/24/2018   TRIG 149 09/24/2022   CHOLHDL 3.5 09/24/2022    Medications Reviewed Today     Reviewed by Jacky Kindle, FNP (Family Nurse Practitioner) on 10/28/22 at 1148  Med List Status: <None>   Medication Order Taking? Sig Documenting Provider Last Dose Status Informant  Accu-Chek Softclix Lancets lancets [id number] Yes Use to check blood sugar daily for type 2 diabetes. E11.9 Jacky Kindle, FNP Taking Active   amLODipine (NORVASC) 10 MG tablet [id number] Yes TAKE 1 TABLET(10 MG) BY MOUTH DAILY Jacky Kindle, FNP Taking Active   aspirin 81 MG tablet [id number] Yes Take 81 mg by mouth daily. [provider] Taking Active Multiple Informants  blood glucose meter kit and supplies [id number] Yes Dispense based on patient and insurance preference. Use daily. (FOR ICD-10 E10.9, E11.9). Jacky Kindle, FNP Taking Active   Blood Glucose Monitoring Suppl DEVI [id number] Yes 1 each by Does not  apply route in the morning, at noon, and at bedtime. May substitute to any manufacturer covered by patient's insurance. Jacky Kindle, FNP Taking Active   ezetimibe (ZETIA) 10 MG tablet [id number] Yes Take 1 tablet (10 mg total) by mouth daily. Jacky Kindle, FNP Taking Active   gabapentin (NEURONTIN) 600 MG tablet [id number] Yes Take 1 tablet (600 mg total) by mouth 3 (three) times daily. Jacky Kindle, FNP  Active   glipiZIDE (GLIPIZIDE XL) 5 MG 24 hr tablet [id number] Yes Take 1 tablet (5 mg total) by mouth 2 (two) times daily with a meal. Malva Limes, MD Taking Active   glucose blood [facility] VERIO) test strip [id number] Yes Use as instructed Malva Limes, MD Taking Active   HYDROcodone-acetaminophen [facility]) 5-325 MG tablet [id number] Yes Take 2 tablets by mouth every 4 (four) hours. Jacky Kindle, FNP  Active   insulin degludec (TRESIBA) 100 UNIT/ML FlexTouch Pen [id number]  Inject 14 Units into the skin daily. Merita Norton T, FNP  Active   Insulin Pen Needle (PEN NEEDLES 5/16") 30G X 8 MM MISC [id number] Yes 100 each by Does not apply route at bedtime. Jacky Kindle, FNP  Active   Lancet Device MISC [id number] Yes 1 each by Does not apply route 2 (two) times daily as needed. May substitute to any manufacturer covered by patient's insurance. Jacky Kindle, FNP Taking Active   losartan (COZAAR) 25 MG tablet [id number] Yes Take 1 tablet (25 mg total) by mouth daily. Jacky Kindle, FNP Taking Active   methocarbamol (ROBAXIN) 750 MG tablet [id number] Yes  [provider] Taking Active   metoprolol succinate (TOPROL-XL) 25 MG 24 hr tablet [id number] Yes Take 1 tablet (25 mg total) by mouth daily. Jacky Kindle, FNP Taking Active   nitroGLYCERIN (NITROSTAT) 0.4 MG SL tablet [id number] Yes Place 1 tablet (0.4 mg total) under the tongue every 5 (five) minutes as needed for chest pain. Call 9-1-1 if you take 3 tablets. Merita Norton T, FNP Taking Active   pantoprazole (PROTONIX) 40 MG tablet [id number] Yes TAKE 1 TABLET(40 MG) BY MOUTH DAILY Jacky Kindle, FNP Taking Active   rosuvastatin (CRESTOR) 40 MG tablet [id number] Yes TAKE 1 TABLET(40 MG) BY MOUTH DAILY Jacky Kindle, FNP Taking Active   sitaGLIPtin (JANUVIA) 100 MG tablet [id number] Yes Take 1 tablet (100 mg total) by mouth daily. Malva Limes, MD Taking Active   tamsulosin [facility]) 0.4 MG CAPS capsule [id number] Yes Take 2  capsules (0.8 mg total) by mouth daily. Jacky Kindle, FNP Taking Active               Assessment/Plan:   Conversation brief today as patient requests to reschedule the rest of our visit because he is at the barbershop this morning - Will plan to complete medication review during our next telephone call   Diabetes: - Currently uncontrolled - Outreach to [facility] in [place]. Find that pharmacy is billing patient's prescriptions only through his Tricare coverage, rather than also split billing to his Managed Medicaid coverage. Medication billing issue resolved - [facility] rebills patient's prescriptions, including Lantus Solostar and pen needles, and confirms cost with split bill is $4 copayments.  Follow up with patient to provide update Also advise patient, per pharmacy, current [facility] location will be closing as of 5/30 and his prescription record will be transferred to [facility] in [place] - Reviewed long term cardiovascular and renal outcomes of uncontrolled blood sugar -  Reviewed goal A1c, goal fasting, and goal 2 hour post prandial glucose - Reviewed dietary modifications including importance of having regular well-balanced meals, while controlling carbohydrate portion sizes - Counsel patient on s/s of low blood sugar and how to treat lows Review rules of 15s - importance of using 15 grams of sugar to treat low, recheck blood sugar in 15 minutes, treat again if remains low or, if back to normal, having meal if mealtime or snack  Review examples of sources of 15 grams of sugar Encourage patient to pick up glucose tablets from pharmacy today - Recommend to check glucose, keep log of results and use this record to adjust Lantus dosing as directed by PCP. Advise patient to contact office if needed for any concerns or symptoms related to his blood sugar - Counsel patient on benefits of continuous glucose monitoring (CGM) for aid in blood sugar  control  Patient requests to discuss CGM again during our next call   Follow Up Plan: Will outreach to patient by telephone again on 5/29 at 1 pm  Estelle Grumbles, PharmD, [facility] [phone]

## 2022-11-03 NOTE — Telephone Encounter (Signed)
[name] [name] is no longer accepting new patients to his panel.  If patient would like to switch to [name] [name] in our office we will be happy to get him in so she can follow his conditions.

## 2022-11-03 NOTE — Progress Notes (Unsigned)
Referring Physician:  Denton Lank, FNP [address] [place],  [place] [address]  Primary Physician:  Jacky Kindle, FNP  History of Present Illness: 11/05/2022 [name] [name] has a history of HTN, DM, history of GI bleed, CAD with stents, MI, OSA, hyperlipidemia, and history of substance abuse.  Has been seeing PMR for his lower back and neck pain. Referred here for his lumbar spine.   He has 6+ month history of constant LBP with radiation to right groin/testicles and anterior thigh pain to his knee. No left leg pain, but some pain in left testicular area. Pain is worse with laying flat. He is not sleeping well. Minimal relief with changing positions. He notes weakness in both legs. No numbness or tingling.   No NSAIDs due to history of GI bleed. He is on neurontin, robaxin, and norco from PMR- not much relief with these.   Last HgbA1c on 10/28/22 was 12.3.   Bowel/Bladder Dysfunction: none  He does not use tobacco.   Conservative measures:  Physical therapy: referred by PMR at visit on 10/20/22, has seen chiropractor Multimodal medical therapy including regular antiinflammatories: neurontin, robaxin, norco  Injections:  07/08/2018: Right L5-S1 and left S1 transforaminal ESI (moderate relief)   Past Surgery: denies  [name] [name] has no symptoms of cervical myelopathy.  The symptoms are causing a significant impact on the patient's life.   Review of Systems:  A 10 point review of systems is negative, except for the pertinent positives and negatives detailed in the HPI.  Past Medical History: Past Medical History:  Diagnosis Date   ADHD (attention deficit hyperactivity disorder)    Allergy    Arthritis    CAD (coronary artery disease)    S/P cath May 2012 with BMS to the LAD following abnormal stress test   Diabetes mellitus    Drug abuse (HCC)    history of marijuana use and alcohol   GERD (gastroesophageal reflux disease)    Hypercholesterolemia     Hypertension    Myocardial infarction (HCC)    Obesity    Oxygen deficiency    Sleep apnea     Past Surgical History: Past Surgical History:  Procedure Laterality Date   ABSCESS DRAINAGE     right arm; approx 2003   CARDIAC CATHETERIZATION     COLONOSCOPY WITH PROPOFOL N/A 10/10/2020   Procedure: COLONOSCOPY WITH PROPOFOL;  Surgeon: [name], MD;  Location: [facility] ENDOSCOPY;  Service: Gastroenterology;  Laterality: N/A;   CORONARY ANGIOPLASTY     CORONARY STENT PLACEMENT  May 2012   LAD    Allergies: Allergies as of 11/05/2022 - Review Complete 11/05/2022  Allergen Reaction Noted   Trulicity [dulaglutide] Diarrhea and Nausea And Vomiting 10/14/2020   Penicillins  10/01/2010    Medications: Outpatient Encounter Medications as of 11/05/2022  Medication Sig   Accu-Chek Softclix Lancets lancets Use to check blood sugar daily for type 2 diabetes. E11.9   amLODipine (NORVASC) 10 MG tablet TAKE 1 TABLET(10 MG) BY MOUTH DAILY   aspirin 81 MG tablet Take 81 mg by mouth daily.   blood glucose meter kit and supplies Dispense based on patient and insurance preference. Use daily. (FOR ICD-10 E10.9, E11.9).   Blood Glucose Monitoring Suppl DEVI 1 each by Does not apply route in the morning, at noon, and at bedtime. May substitute to any manufacturer covered by patient's insurance.   Continuous Glucose Sensor (FREESTYLE LIBRE 3 SENSOR) MISC Place 1 sensor on the skin every 14  days. Use to check glucose continuously   ezetimibe (ZETIA) 10 MG tablet Take 1 tablet (10 mg total) by mouth daily.   gabapentin (NEURONTIN) 600 MG tablet Take 1 tablet (600 mg total) by mouth 3 (three) times daily.   glipiZIDE (GLIPIZIDE XL) 5 MG 24 hr tablet Take 1 tablet (5 mg total) by mouth 2 (two) times daily with a meal.   glucose blood (ONETOUCH VERIO) test strip Use as instructed   HYDROcodone-acetaminophen (NORCO) 5-325 MG tablet Take 2 tablets by mouth every 4 (four) hours.   insulin glargine (LANTUS  SOLOSTAR) 100 UNIT/ML Solostar Pen 16 units nightly; check fasting blood sugar daily. Titrate dose every 3 days by 2 units to meet fasting blood sugar of 110-120.   Insulin Pen Needle (PEN NEEDLES 5/16") 30G X 8 MM MISC 100 each by Does not apply route at bedtime.   Lancet Device MISC 1 each by Does not apply route 2 (two) times daily as needed. May substitute to any manufacturer covered by patient's insurance.   losartan (COZAAR) 25 MG tablet Take 1 tablet (25 mg total) by mouth daily.   methocarbamol (ROBAXIN) 750 MG tablet 1/2 TO 1 TABLET BY MOUTH TWICE DAILY AS NEEDED   metoprolol succinate (TOPROL-XL) 25 MG 24 hr tablet Take 1 tablet (25 mg total) by mouth daily.   nitroGLYCERIN (NITROSTAT) 0.4 MG SL tablet Place 1 tablet (0.4 mg total) under the tongue every 5 (five) minutes as needed for chest pain. Call 9-1-1 if you take 3 tablets.   pantoprazole (PROTONIX) 40 MG tablet TAKE 1 TABLET(40 MG) BY MOUTH DAILY   rosuvastatin (CRESTOR) 40 MG tablet TAKE 1 TABLET(40 MG) BY MOUTH DAILY   sitaGLIPtin (JANUVIA) 100 MG tablet Take 1 tablet (100 mg total) by mouth daily.   tamsulosin (FLOMAX) 0.4 MG CAPS capsule TAKE 2 CAPSULES(0.8 MG) BY MOUTH DAILY   No facility-administered encounter medications on file as of 11/05/2022.    Social History: Social History   Tobacco Use   Smoking status: Former    Packs/day: 1.00    Years: 14.00    Additional pack years: 0.00    Total pack years: 14.00    Types: Cigarettes    Quit date: 06/2017    Years since quitting: 5.4   Smokeless tobacco: Never  Vaping Use   Vaping Use: Never used  Substance Use Topics   Alcohol use: Yes    Alcohol/week: 10.0 standard drinks of alcohol    Types: 2 Cans of beer, 8 Shots of liquor per week    Comment: weekends   Drug use: Yes    Frequency: 20.0 times per week    Types: Marijuana    Family Medical History: Family History  Problem Relation Age of Onset   Hypertension Mother    Diabetes Mother    Cancer  Mother    Hypertension Father    Diabetes Father    Heart disease Father        cabg 3; 1995; 54 stents   Hypertension Sister    Heart disease Paternal Uncle    Heart disease Paternal Grandmother     Physical Examination: Vitals:   11/05/22 1318  BP: 118/78    General: Patient is well developed, well nourished, calm, collected, and in no apparent distress. Attention to examination is appropriate.  Respiratory: Patient is breathing without any difficulty.   NEUROLOGICAL:     Awake, alert, oriented to person, place, and time.  Speech is clear and fluent. Fund of  knowledge is appropriate.   Cranial Nerves: Pupils equal round and reactive to light.  Facial tone is symmetric.    Lilmited ROM of lumbar spine with pain on extension He has lower posterior lumbar tenderness.   No abnormal lesions on exposed skin.   Strength: Side Biceps Triceps Deltoid Interossei Grip Wrist Ext. Wrist Flex.  R 5 5 5 5 5 5 5   L 5 5 5 5 5 5 5    Side Iliopsoas Quads Hamstring PF DF EHL  R 5 5 5 5 5 5   L 5 5 5 5 5 5    Reflexes are 2+ and symmetric at the biceps, triceps, brachioradialis, patella and achilles.   Hoffman's is absent.  Clonus is not present.   Bilateral upper and lower extremity sensation is intact to light touch.     Gait is normal.     Medical Decision Making  Imaging: MRI of lumbar spine dated 10/17/22:  FINDINGS: Segmentation: 5 non rib-bearing lumbar type vertebral bodies are present. The lowest fully formed vertebral body is L5.   Alignment: No significant listhesis is present. Straightening of the normal lumbar lordosis is present, stable from the prior exam.   Vertebrae: Chronic type 2 scratched at type 2 Modic changes are present at L5-S1. Marrow signal and vertebral body heights are otherwise normal.   Conus medullaris and cauda equina: Conus extends to the T12 level. Conus and cauda equina appear normal.   Paraspinal and other soft tissues: Limited imaging  the abdomen is unremarkable. There is no significant adenopathy. No solid organ lesions are present.   Disc levels:   T12-L1: Insert normal disc   L1-2: Normal disc signal and height is present. No focal protrusion or stenosis is present.   L2-3: Insert Normal Disc   L3-4: Normal disc signal and height is present. No focal protrusion or stenosis is present.   L4-5: A mild broad-based disc protrusion is present with a central annular tear. Mild bilateral sub articular in foraminal stenosis is present, left greater than right.   L5-S1: A central disc protrusion and left paramidline annular tear is present. No focal stenosis is present. Mild facet hypertrophy is present bilaterally.   IMPRESSION: 1. 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 2. Central disc protrusion and left paramidline annular tear at L5-S1 without significant stenosis. 3. Mild facet hypertrophy at L5-S1.     Electronically Signed   By: Marin Roberts M.D.   On: 10/17/2022 12:00        I have personally reviewed the images and agree with the above interpretation.  Above MRI reviewed with Dr. Myer Haff as well.    Assessment and Plan: [name] [name] is a pleasant 47 y.o. male has 6+ month history of constant LBP with radiation to right groin/testicles and anterior thigh pain to his knee. No left leg pain, but some pain in left testicular area.   He has DDD L5-S1 with modic changes and facet hypertrophy. Broad based disc at L4-L5 with only minimal lateral recess stenosis.   LBP is likely due to DDD L5-S1. Etiology of leg pain not clear on MRI scan.   Treatment options discussed with Dr. Myer Haff and following plan made with patient:   - Recommend he continue with conservative care including PT and injections when able. - Order for physical therapy for lumbar spine to [facility]. Patient to  call to schedule appointment.  -  Last HgbA1c on 10/28/22 was 12.3. He is working on this with PCP. Recommend he have ESIs when able to do so.  - Continue current medications including neurontin, robaxin, and norco from PMR.  - No NSAIDs due to history of GI bleed.  - If no improvement with conservative treatments, he  may be candidate for surgery at L5-S1. This would help his back pain but not his leg pain.  - He would need to complete PT prior to any surgery discussion and his HgbA1c would need to be 7.5.  - He will follow up with me in 6-8 weeks for recheck.   I spent a total of 40 minutes in face-to-face and non-face-to-face activities related to this patient's care today including review of outside records, review of imaging, review of symptoms, physical exam, discussion of differential diagnosis, discussion of treatment options, and documentation.   Thank you for involving me in the care of this patient.   Drake Leach PA-C Dept. of Neurosurgery

## 2022-11-03 NOTE — Patient Instructions (Signed)
Goals Addressed             This Visit's Progress    Pharmacy Goals       Our goal A1c is less than 7%. This corresponds with fasting sugars less than 130 and 2 hour after meal sugars less than 180. Please keep a log of your results when checking your blood sugar.  Our goal bad cholesterol, or LDL, is less than 70. This is why it is important to continue taking your rosuvastatin.  Please have your medications and blood sugar log with you for our next telephone call on 11/04/2022.  Estelle Grumbles, PharmD, [facility] [phone]

## 2022-11-04 ENCOUNTER — Other Ambulatory Visit: Admitting: Pharmacist

## 2022-11-04 DIAGNOSIS — E1140 Type 2 diabetes mellitus with diabetic neuropathy, unspecified: Secondary | ICD-10-CM

## 2022-11-04 MED ORDER — FREESTYLE LIBRE 3 SENSOR MISC
12 refills | Status: AC
Start: 2022-11-04 — End: ?

## 2022-11-04 NOTE — Progress Notes (Signed)
11/04/2022 Name: [name] [name] MRN: [id number] DOB: 04-18-1976  Chief Complaint  Patient presents with   Medication Management    [name] [name] is a 47 y.o. year old male who presented for a telephone visit.   They were referred to the pharmacist by their PCP for assistance in managing diabetes and medication access.    Patient is participating in a Managed Medicaid Plan:  Yes, Engineer, civil (consulting) (Note patient also has Tricare coverage)   Subjective:   Care Team: Primary Care Provider: Jacky Kindle, FNP ; Next Scheduled Visit with office: 11/06/2022 Gastroenterology: [name], MD; Next Scheduled Visit: 01/04/2023 Dietician: Altamese Cabal, RD; Initial Visit Scheduled for: 11/12/2022 Neurosurgery: Drake Leach, PA-C; Visit Scheduled for: 11/05/2022  Medication Access/Adherence  Current Pharmacy:  [facility] - [place], [place] - [address] [place] [address] Phone: [phone] Fax: [phone]  [facility] [id number] [place], [place] - [address] AT [facility] [address] [place] [address] Phone: [phone] Fax: [phone]   Patient reports affordability concerns with their medications: No  Patient reports access/transportation concerns to their pharmacy: No  Patient reports adherence concerns with their medications:  No     Reports his wife is a nurse and helps with managing his medications Denies currently using weekly pillbox  Diabetes:  Current medications:  - glipizide ER 5 mg twice daily (reports started taking twice daily as directed as of 5/24) - Januvia 100 mg daily - Lantus insulin - 16 units nightly (started 5/23) Note PCP has advised patient: check fasting blood sugar daily. Titrate dose every 3 days by 2 units to meet fasting blood sugar of 110-120     Medications tried in the past: metformin, Victoza, Trulicity (GI side effects)   Reports recent morning fasting  blood sugar readings: - Today: 168 - 5/28: 185 - 5/27: 161 - 5/26: 200 (attributes to drinking ginger ale night before)  Reports considered continuous glucose monitoring (CGM) as previously discussed and interested in starting CGM - Reports he is getting a new smartphone tomorrow and will use this in place of the reader device - Reports his wife will help him with getting setting up/getting started on the CGM  Patient denies hypoglycemic s/sx including dizziness, shakiness, sweating  Current meal patterns:  - Breakfast: oatmeal or eggs and grits or biscuit - Lunch: leftovers from supper - Supper: cubed steak, rice and string beans - Snacks: popcorn and fruit (apples, strawberries and oranges) - Drinks: mostly water, but sometimes  sweet tea and ginger ale and coffee with creamer  Reports has been working on making positive dietary changes, including cutting potatoes out of his diet  Lipid control: rosuvastatin 40 mg daily & ezetimibe 10 mg daily   Hypertension:  Current medications:  - amlodipine 10 mg daily - losartan 25 mg daily - metoprolol ER 25 mg daily  Patient has a home BP monitor, but denies checking recently  Patient denies hypotensive s/sx including dizziness, lightheadedness.     Objective:  Lab Results  Component Value Date   HGBA1C 12.3 (A) 10/28/2022    Lab Results  Component Value Date   CREATININE 0.91 09/24/2022   BUN 8 09/24/2022   NA 138 09/24/2022   K 4.4 09/24/2022   CL 101 09/24/2022   CO2 22 09/24/2022    Lab Results  Component Value Date   CHOL 125 09/24/2022   HDL 36 (L) 09/24/2022  LDLCALC 63 09/24/2022   LDLDIRECT 129 (H) 06/24/2018   TRIG 149 09/24/2022   CHOLHDL 3.5 09/24/2022   BP Readings from Last 3 Encounters:  10/28/22 (!) 122/90  10/07/22 110/77  09/24/22 94/69   Pulse Readings from Last 3 Encounters:  10/28/22 67  10/07/22 69  09/24/22 65     Medications Reviewed Today     Reviewed by Jacky Kindle,  FNP (Family Nurse Practitioner) on 10/28/22 at 1148  Med List Status: <None>   Medication Order Taking? Sig Documenting Provider Last Dose Status Informant  Accu-Chek Softclix Lancets lancets [id number] Yes Use to check blood sugar daily for type 2 diabetes. E11.9 Jacky Kindle, FNP Taking Active   amLODipine (NORVASC) 10 MG tablet [id number] Yes TAKE 1 TABLET(10 MG) BY MOUTH DAILY Jacky Kindle, FNP Taking Active   aspirin 81 MG tablet [id number] Yes Take 81 mg by mouth daily. [provider] Taking Active Multiple Informants  blood glucose meter kit and supplies [id number] Yes Dispense based on patient and insurance preference. Use daily. (FOR ICD-10 E10.9, E11.9). Jacky Kindle, FNP Taking Active   Blood Glucose Monitoring Suppl DEVI [id number] Yes 1 each by Does not apply route in the morning, at noon, and at bedtime. May substitute to any manufacturer covered by patient's insurance. Jacky Kindle, FNP Taking Active   ezetimibe (ZETIA) 10 MG tablet [id number] Yes Take 1 tablet (10 mg total) by mouth daily. Jacky Kindle, FNP Taking Active   gabapentin (NEURONTIN) 600 MG tablet [id number] Yes Take 1 tablet (600 mg total) by mouth 3 (three) times daily. Jacky Kindle, FNP  Active   glipiZIDE (GLIPIZIDE XL) 5 MG 24 hr tablet [id number] Yes Take 1 tablet (5 mg total) by mouth 2 (two) times daily with a meal. Malva Limes, MD Taking Active   glucose blood [facility] VERIO) test strip [id number] Yes Use as instructed Malva Limes, MD Taking Active   HYDROcodone-acetaminophen [facility]) 5-325 MG tablet [id number] Yes Take 2 tablets by mouth every 4 (four) hours. Jacky Kindle, FNP  Active   insulin degludec (TRESIBA) 100 UNIT/ML FlexTouch Pen [id number]  Inject 14 Units into the skin daily. Merita Norton T, FNP  Active   Insulin Pen Needle (PEN NEEDLES 5/16") 30G X 8 MM MISC [id number] Yes 100 each by Does not apply route at bedtime. Jacky Kindle, FNP  Active   Lancet Device MISC [id number] Yes 1  each by Does not apply route 2 (two) times daily as needed. May substitute to any manufacturer covered by patient's insurance. Jacky Kindle, FNP Taking Active   losartan (COZAAR) 25 MG tablet [id number] Yes Take 1 tablet (25 mg total) by mouth daily. Jacky Kindle, FNP Taking Active   methocarbamol (ROBAXIN) 750 MG tablet [id number] Yes  [provider] Taking Active   metoprolol succinate (TOPROL-XL) 25 MG 24 hr tablet [id number] Yes Take 1 tablet (25 mg total) by mouth daily. Jacky Kindle, FNP Taking Active   nitroGLYCERIN (NITROSTAT) 0.4 MG SL tablet [id number] Yes Place 1 tablet (0.4 mg total) under the tongue every 5 (five) minutes as needed for chest pain. Call 9-1-1 if you take 3 tablets. Merita Norton T, FNP Taking Active   pantoprazole (PROTONIX) 40 MG tablet [id number] Yes TAKE 1 TABLET(40 MG) BY MOUTH DAILY Jacky Kindle, FNP Taking Active   rosuvastatin (CRESTOR) 40 MG tablet [id number] Yes TAKE 1 TABLET(40 MG) BY MOUTH DAILY Jacky Kindle, FNP Taking Active  sitaGLIPtin (JANUVIA) 100 MG tablet [id number] Yes Take 1 tablet (100 mg total) by mouth daily. Malva Limes, MD Taking Active   tamsulosin [facility]) 0.4 MG CAPS capsule [id number] Yes Take 2 capsules (0.8 mg total) by mouth daily. Jacky Kindle, FNP Taking Active              Assessment/Plan:   Encourage patient to start using a weekly pillbox  Comprehensive medication review performed; medication list updated in electronic medical record - Caution patient for increased risk of dizziness/sedation with gabapentin, methocarbamol and hydrocodone, particularly if taken in combination  Patient verbalizes understanding, but denies dizziness or sedation  Diabetes: - Currently uncontrolled - Reviewed long term cardiovascular and renal outcomes of uncontrolled blood sugar - Reviewed dietary modifications including importance of having regular well-balanced meals and snacks, while controlling carbohydrate portion  sizes  Encourage patient to reduce sugary beverage consumption, drinking water in its place  Encourage patient to review nutrition labels for carbohydrate content of foods  Discuss ideas for lower carbohydrate content snack ideas - Advise patient to follow instructions as provided by PCP for adjusting Lantus dose: "check fasting blood sugar daily. Titrate dose every 3 days by 2 units to meet fasting blood sugar of 110-120" - Have counseled patient on s/s of low blood sugar and how to treat lows Review rules of 15s - importance of using 15 grams of sugar to treat low, recheck blood sugar in 15 minutes, treat again if remains low or, if back to normal, having meal if mealtime or snack  Review examples of sources of 15 grams of sugar Again encourage patient to pick up glucose tablets from pharmacy today - Recommend to check glucose, keep log of results and use this record to adjust Lantus dosing as directed by PCP. Advise patient to contact office if needed for any concerns or symptoms related to his blood sugar - Collaborated with PCP regarding recommendation to start continuous glucose monitoring for patient  Provider agreed and today patient interested in getting started on Freestyle Libre 3  Patient plans to use smart phone in place of reader device  Submit prior authorization request via CoverMyMeds for patient. PA approved through 05/07/2023  Collaborate with PCP to send Rx to pharmacy for patient. Follow up with pharmacy to confirm coverage Counsel patient on how to access/to review the setup videos for the [facility] 3 from the manufacturer website Patient to review these videos and confirm smartphone compatibility prior to starting to use sensor and contact office or clinical pharmacist with any concerns Provide patient with phone number for [employer] Support [phone])  Hypertension: - Reviewed long term cardiovascular and renal outcomes of uncontrolled blood pressure -  Reviewed appropriate blood pressure monitoring technique and reviewed goal blood pressure. Recommended to check home blood pressure and heart rate daily, keep log of results and have this record to review during upcoming appointments     Follow Up Plan: Will outreach to patient by telephone again on 11/25/2022 at 3:30 PM    Estelle Grumbles, PharmD, [facility] [phone]

## 2022-11-04 NOTE — Patient Instructions (Signed)
Goals Addressed             This Visit's Progress    Pharmacy Goals       Our goal A1c is less than 7%. This corresponds with fasting sugars less than 130 and 2 hour after meal sugars less than 180. Please keep a log of your results when checking your blood sugar.  Our goal bad cholesterol, or LDL, is less than 70. This is why it is important to continue taking your rosuvastatin.  Prior to starting to use the [employer] 3 sensors, please review each of the getting started videos at:  https://www.freestyle.abbott/us-en/how-to-set-up.html  Please note that you will need to download and setup the [facility] 3 app first.  If you would like assistance as you are getting started or for help with the product in the future, you can reach [employer] at [phone] (available 7 days a week from 8 am to 8 pm)   Estelle Grumbles, PharmD, [facility] [phone]

## 2022-11-05 ENCOUNTER — Ambulatory Visit (INDEPENDENT_AMBULATORY_CARE_PROVIDER_SITE_OTHER): Admitting: Orthopedic Surgery

## 2022-11-05 ENCOUNTER — Encounter: Payer: Self-pay | Admitting: Orthopedic Surgery

## 2022-11-05 VITALS — BP 118/78 | Ht 75.0 in | Wt 252.0 lb

## 2022-11-05 DIAGNOSIS — M4726 Other spondylosis with radiculopathy, lumbar region: Secondary | ICD-10-CM | POA: Diagnosis not present

## 2022-11-05 DIAGNOSIS — M5416 Radiculopathy, lumbar region: Secondary | ICD-10-CM

## 2022-11-05 DIAGNOSIS — M47816 Spondylosis without myelopathy or radiculopathy, lumbar region: Secondary | ICD-10-CM

## 2022-11-05 DIAGNOSIS — M5136 Other intervertebral disc degeneration, lumbar region: Secondary | ICD-10-CM | POA: Diagnosis not present

## 2022-11-05 NOTE — Patient Instructions (Signed)
It was so nice to see you today. Thank you so much for coming in.    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 I don't see a good cause of leg pain on your MRI.   You may be a candidate for surgery at L5-S1. This surgery would only help the back pain, it would not help with leg pain.   Prior to considering surgery, you need to try the conservative treatments like PT and injections.   Continue to work on your blood sugars. Hemoglobin A1c would need to be 7.5 or less.   I sent physical therapy orders to [facility]. You can call them at [phone] if you don't hear from them to schedule your visit.   I will see you back in 6-8 weeks. Please do not hesitate to call if you have any questions or concerns. You can also message me in MyChart.   Drake Leach PA-C [phone]

## 2022-11-05 NOTE — Progress Notes (Signed)
I,[name],acting as a scribe for [name], MD.,have documented all relevant documentation on the behalf of [name], MD,as directed by  [name], MD      Established patient visit   Patient: [name] [name]   DOB: Jun 19, 1975   47 y.o. Male  MRN: [id number] Visit Date: 11/06/2022  Today's healthcare provider: [name], MD   Chief Complaint  Patient presents with   Testicle Pain   Subjective    HPI  Patient C/O recurrent and worsening testicular pain and swelling. Patient denies any painful urination or burning. He does reports pressure with urination. He denies any blood in urine or any abnormal discharge. He denies any rash. Radiates into right inguinal area and right back. He does have long history of chronic low back pain currently being followed by physiatry with recent MRI showing: IMPRESSION: 1. 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 2. Central disc protrusion and left paramidline annular tear at L5-S1 without significant stenosis. 3. Mild facet hypertrophy at L5-S1. -----------------------------------------------------------------------------------------   Medications: Outpatient Medications Prior to Visit  Medication Sig   Accu-Chek Softclix Lancets lancets Use to check blood sugar daily for type 2 diabetes. E11.9   amLODipine (NORVASC) 10 MG tablet TAKE 1 TABLET(10 MG) BY MOUTH DAILY   aspirin 81 MG tablet Take 81 mg by mouth daily.   blood glucose meter kit and supplies Dispense based on patient and insurance preference. Use daily. (FOR ICD-10 E10.9, E11.9).   Blood Glucose Monitoring Suppl DEVI 1 each by Does not apply route in the morning, at noon, and at bedtime. May substitute to any manufacturer covered by patient's insurance.   Continuous Glucose Sensor (FREESTYLE LIBRE 3 SENSOR) MISC Place 1 sensor on the skin every 14 days.  Use to check glucose continuously   ezetimibe (ZETIA) 10 MG tablet Take 1 tablet (10 mg total) by mouth daily.   gabapentin (NEURONTIN) 600 MG tablet Take 1 tablet (600 mg total) by mouth 3 (three) times daily.   glipiZIDE (GLIPIZIDE XL) 5 MG 24 hr tablet Take 1 tablet (5 mg total) by mouth 2 (two) times daily with a meal.   glucose blood (ONETOUCH VERIO) test strip Use as instructed   HYDROcodone-acetaminophen (NORCO) 5-325 MG tablet Take 2 tablets by mouth every 4 (four) hours.   insulin glargine (LANTUS SOLOSTAR) 100 UNIT/ML Solostar Pen 16 units nightly; check fasting blood sugar daily. Titrate dose every 3 days by 2 units to meet fasting blood sugar of 110-120.   Insulin Pen Needle (PEN NEEDLES 5/16") 30G X 8 MM MISC 100 each by Does not apply route at bedtime.   Lancet Device MISC 1 each by Does not apply route 2 (two) times daily as needed. May substitute to any manufacturer covered by patient's insurance.   losartan (COZAAR) 25 MG tablet Take 1 tablet (25 mg total) by mouth daily.   methocarbamol (ROBAXIN) 750 MG tablet 1/2 TO 1 TABLET BY MOUTH TWICE DAILY AS NEEDED   metoprolol succinate (TOPROL-XL) 25 MG 24 hr tablet Take 1 tablet (25 mg total) by mouth daily.   nitroGLYCERIN (NITROSTAT) 0.4 MG SL tablet Place 1 tablet (0.4 mg total) under the tongue every 5 (five) minutes as needed for chest pain. Call 9-1-1 if you take 3 tablets.   pantoprazole (PROTONIX) 40 MG tablet TAKE 1 TABLET(40 MG) BY MOUTH DAILY   QUEtiapine (SEROQUEL) 50 MG tablet Take 50 mg  by mouth at bedtime.   rosuvastatin (CRESTOR) 40 MG tablet TAKE 1 TABLET(40 MG) BY MOUTH DAILY   sitaGLIPtin (JANUVIA) 100 MG tablet Take 1 tablet (100 mg total) by mouth daily.   tamsulosin (FLOMAX) 0.4 MG CAPS capsule TAKE 2 CAPSULES(0.8 MG) BY MOUTH DAILY   No facility-administered medications prior to visit.         Objective    BP 101/71 (BP Location: Left Arm, Patient Position: Sitting, Cuff Size: Large)   Pulse 60   Temp  97.9 F (36.6 C) (Temporal)   Resp 16   Wt 254 lb (115.2 kg)   BMI 31.75 kg/m    Physical Exam  Slightly swollen right epididymis.   Assessment & Plan     1. Epididymitis  - doxycycline (VIBRA-TABS) 100 MG tablet; Take 1 tablet (100 mg total) by mouth 2 (two) times daily for 10 days.  Dispense: 20 tablet; Refill: 0  2. Low back pain potentially associated with radiculopathy Unclear if any of this is related to testicular pain, is being followed by physiatry who recently referred to neurosurgery.        The entirety of the information documented in the History of Present Illness, Review of Systems and Physical Exam were personally obtained by me. Portions of this information were initially documented by the CMA and reviewed by me for thoroughness and accuracy.     [name], MD  [facility] [phone] (phone) [phone] (fax)  [facility]

## 2022-11-06 ENCOUNTER — Encounter: Payer: Self-pay | Admitting: Family Medicine

## 2022-11-06 ENCOUNTER — Ambulatory Visit (INDEPENDENT_AMBULATORY_CARE_PROVIDER_SITE_OTHER): Admitting: Family Medicine

## 2022-11-06 VITALS — BP 101/71 | HR 60 | Temp 97.9°F | Resp 16 | Wt 254.0 lb

## 2022-11-06 DIAGNOSIS — N451 Epididymitis: Secondary | ICD-10-CM | POA: Diagnosis not present

## 2022-11-06 DIAGNOSIS — M545 Low back pain, unspecified: Secondary | ICD-10-CM

## 2022-11-06 MED ORDER — DOXYCYCLINE HYCLATE 100 MG PO TABS
100.0000 mg | ORAL_TABLET | Freq: Two times a day (BID) | ORAL | 0 refills | Status: AC
Start: 2022-11-06 — End: 2022-11-16

## 2022-11-12 ENCOUNTER — Ambulatory Visit: Admitting: Dietician

## 2022-11-25 ENCOUNTER — Other Ambulatory Visit: Payer: Self-pay

## 2022-11-25 ENCOUNTER — Telehealth: Payer: Self-pay | Admitting: Pharmacist

## 2022-11-25 ENCOUNTER — Emergency Department
Admission: EM | Admit: 2022-11-25 | Discharge: 2022-11-25 | Disposition: A | Discharge status: Home / Self Care | Attending: Emergency Medicine | Admitting: Emergency Medicine

## 2022-11-25 ENCOUNTER — Emergency Department

## 2022-11-25 ENCOUNTER — Other Ambulatory Visit: Admitting: Pharmacist

## 2022-11-25 ENCOUNTER — Encounter: Payer: Self-pay | Admitting: Emergency Medicine

## 2022-11-25 DIAGNOSIS — R197 Diarrhea, unspecified: Secondary | ICD-10-CM | POA: Diagnosis not present

## 2022-11-25 DIAGNOSIS — R112 Nausea with vomiting, unspecified: Secondary | ICD-10-CM | POA: Insufficient documentation

## 2022-11-25 DIAGNOSIS — I10 Essential (primary) hypertension: Secondary | ICD-10-CM | POA: Insufficient documentation

## 2022-11-25 DIAGNOSIS — R1900 Intra-abdominal and pelvic swelling, mass and lump, unspecified site: Secondary | ICD-10-CM

## 2022-11-25 DIAGNOSIS — K689 Other disorders of retroperitoneum: Secondary | ICD-10-CM | POA: Diagnosis not present

## 2022-11-25 DIAGNOSIS — R748 Abnormal levels of other serum enzymes: Secondary | ICD-10-CM | POA: Diagnosis not present

## 2022-11-25 DIAGNOSIS — E119 Type 2 diabetes mellitus without complications: Secondary | ICD-10-CM | POA: Insufficient documentation

## 2022-11-25 LAB — COMPREHENSIVE METABOLIC PANEL
ALT: 20 U/L (ref 0–44)
AST: 20 U/L (ref 15–41)
Albumin: 4.9 g/dL (ref 3.5–5.0)
Alkaline Phosphatase: 52 U/L (ref 38–126)
Anion gap: 15 (ref 5–15)
BUN: 14 mg/dL (ref 6–20)
CO2: 16 mmol/L — ABNORMAL LOW (ref 22–32)
Calcium: 9.5 mg/dL (ref 8.9–10.3)
Chloride: 109 mmol/L (ref 98–111)
Creatinine, Ser: 0.99 mg/dL (ref 0.61–1.24)
GFR, Estimated: >60 mL/min (ref >60)
Glucose, Bld: 157 mg/dL — ABNORMAL HIGH (ref 70–99)
Potassium: 3.4 mmol/L — ABNORMAL LOW (ref 3.5–5.1)
Sodium: 140 mmol/L (ref 135–145)
Total Bilirubin: 0.9 mg/dL (ref 0.3–1.2)
Total Protein: 8.7 g/dL — ABNORMAL HIGH (ref 6.5–8.1)

## 2022-11-25 LAB — CBC
HCT: 44.3 % (ref 39.0–52.0)
Hemoglobin: 14.8 g/dL (ref 13.0–17.0)
MCH: 30.1 pg (ref 26.0–34.0)
MCHC: 33.4 g/dL (ref 30.0–36.0)
MCV: 90 fL (ref 80.0–100.0)
Platelets: 267 10*3/uL (ref 150–400)
RBC: 4.92 MIL/uL (ref 4.22–5.81)
RDW: 13 % (ref 11.5–15.5)
WBC: 6.9 10*3/uL (ref 4.0–10.5)
nRBC: 0 % (ref 0.0–0.2)

## 2022-11-25 LAB — CBG MONITORING, ED: Glucose-Capillary: 120 mg/dL — ABNORMAL HIGH (ref 70–99)

## 2022-11-25 LAB — LIPASE, BLOOD: Lipase: 65 U/L — ABNORMAL HIGH (ref 11–51)

## 2022-11-25 MED ORDER — IOHEXOL 300 MG/ML  SOLN
100.0000 mL | Freq: Once | INTRAMUSCULAR | Status: AC | PRN
  Administered 2022-11-25: 100 mL via INTRAVENOUS

## 2022-11-25 MED ORDER — ONDANSETRON HCL 4 MG PO TABS: 4.0000 mg | ORAL_TABLET | Freq: Four times a day (QID) | ORAL | 0 refills | Status: AC | PRN

## 2022-11-25 MED ORDER — SODIUM CHLORIDE 0.9 % IV BOLUS
1000.0000 mL | Freq: Once | INTRAVENOUS | Status: AC
  Administered 2022-11-25: 1000 mL via INTRAVENOUS

## 2022-11-25 MED ORDER — DICYCLOMINE HCL 20 MG PO TABS: 20.0000 mg | ORAL_TABLET | Freq: Four times a day (QID) | ORAL | 0 refills | Status: DC | PRN

## 2022-11-25 MED ORDER — MORPHINE SULFATE (PF) 4 MG/ML IV SOLN
4.0000 mg | Freq: Once | INTRAVENOUS | Status: AC
  Administered 2022-11-25: 4 mg via INTRAVENOUS
  Filled 2022-11-25: qty 1

## 2022-11-25 MED ORDER — ONDANSETRON HCL 4 MG/2ML IJ SOLN
4.0000 mg | Freq: Once | INTRAMUSCULAR | Status: AC
  Administered 2022-11-25: 4 mg via INTRAVENOUS
  Filled 2022-11-25: qty 2

## 2022-11-25 NOTE — ED Triage Notes (Signed)
Pt to ED via POV. Pt states that he has been having lower abdominal pain, N/V/D for the past few days. Pt states that these symptoms have been recurrent since starting diabetes medication a few months ago. Pt reports that he starts burping and gets the taste of "rotten eggs" in his mouth and then vomits. Pt denies fever. Pt appear uncomfortable.

## 2022-11-25 NOTE — ED Provider Notes (Signed)
[facility] Provider Note    Event Date/Time   First MD Initiated Contact with Patient 11/25/22 1826     (approximate)   History   Abdominal Pain and Emesis   HPI  [name] [name] is a 47 y.o. male with history of diabetes, HTN presenting to the emergency department for evaluation of vomiting, diarrhea, abdominal pain.  Started 2 days ago.  Reports he has had a few episodes in the past similar to this which she initially thought may be related to his medications.  Abdominal pain is in his lower abdomen, sharp, crampy.  No dysuria.  Does report regular marijuana use, denies known history of cyclical vomiting syndrome.      Physical Exam   Triage Vital Signs: ED Triage Vitals  Enc Vitals Group     BP 11/25/22 1818 93/80     Pulse Rate 11/25/22 1818 (!) 101     Resp 11/25/22 1818 18     Temp 11/25/22 1818 98.5 F (36.9 C)     Temp Source 11/25/22 1818 Oral     SpO2 11/25/22 1818 98 %     Weight --      Height --      Head Circumference --      Peak Flow --      Pain Score 11/25/22 1819 10     Pain Loc --      Pain Edu? --      Excl. in GC? --     Most recent vital signs: Vitals:   11/25/22 1818 11/25/22 1900  BP: 93/80 120/77  Pulse: (!) 101 84  Resp: 18 18  Temp: 98.5 F (36.9 C)   SpO2: 98% 100%     General: Awake, interactive *** CV:  Regular rate, good peripheral perfusion. *** Resp:  Lungs clear, unlabored respirations. *** Abd:  Soft, nondistended. *** Neuro:  Symmetric facial movement, fluid speech***   ED Results / Procedures / Treatments   Labs (all labs ordered are listed, but only abnormal results are displayed) Labs Reviewed  LIPASE, BLOOD - Abnormal; Notable for the following components:      Result Value   Lipase 65 (*)    All other components within normal limits  COMPREHENSIVE METABOLIC PANEL - Abnormal; Notable for the following components:   Potassium 3.4 (*)    CO2 16 (*)    Glucose, Bld 157 (*)     Total Protein 8.7 (*)    All other components within normal limits  CBG MONITORING, ED - Abnormal; Notable for the following components:   Glucose-Capillary 120 (*)    All other components within normal limits  CBC  URINALYSIS, ROUTINE W REFLEX MICROSCOPIC     EKG EKG independently reviewed interpreted by myself (ER attending) demonstrates:    RADIOLOGY Imaging independently reviewed and interpreted by myself demonstrates:    PROCEDURES:  Critical Care performed: {CriticalCareYesNo:19197::"Yes, see critical care procedure note(s)","No"}  Procedures   MEDICATIONS ORDERED IN ED: Medications - No data to display   IMPRESSION / MDM / ASSESSMENT AND PLAN / ED COURSE  I reviewed the triage vital signs and the nursing notes.  Differential diagnosis includes, but is not limited to, ***  Patient's presentation is most consistent with {EM COPA:27473}  ***      FINAL CLINICAL IMPRESSION(S) / ED DIAGNOSES   Final diagnoses:  None     Rx / DC Orders   ED Discharge Orders     None  Note:  This document was prepared using Dragon voice recognition software and may include unintentional dictation errors. 

## 2022-11-25 NOTE — ED Notes (Signed)
Pt verbalizes understanding of discharge instructions. Opportunity for questioning and answers were provided. Pt discharged from ED to home with significant other.   ? ?

## 2022-11-25 NOTE — Discharge Instructions (Addendum)
You were seen in the emergency room today for evaluation of your abdominal pain, vomiting, diarrhea.  I am glad you are feeling better after receiving medicines here.  Your labs and CT scan did not show an emergency cause for your symptoms.  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  It is very important that you follow-up with both the urologist and the oncology team for further evaluation.  I have included contact information for both of them above.  Please return to the ER for any new or worsening symptoms including severe abdominal pain, inability to urinate, inability to tolerate food or liquids, or any other new or concerning symptoms.  I have sent a prescription for Zofran for nausea and Bentyl for abdominal cramping to your pharmacy that you can use as needed.

## 2022-11-25 NOTE — Progress Notes (Addendum)
Case discussed with Dr. Rosalia Hammers.  Patient presented with nausea, vomiting. 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 He will be dischaged from ED today.   I have messaged my staff to arrange for outpatient f/u with me within a week for further work up.

## 2022-11-25 NOTE — Progress Notes (Signed)
   Outreach Note  11/25/2022 Name: Sheik Bogacki MRN: [id number] DOB: Jan 19, 1976  Referred by: Jacky Kindle, FNP Reason for referral : No chief complaint on file.   Was unable to reach patient via telephone today and have left HIPAA compliant voicemail asking patient to return my call.    Follow Up Plan: Will collaborate with Care Guide to outreach to schedule follow up with me  Estelle Grumbles, PharmD, [facility] Clinical Pharmacist [facility] [phone]

## 2022-11-25 NOTE — ED Notes (Signed)
Pt gone to CT 

## 2022-11-30 ENCOUNTER — Other Ambulatory Visit: Payer: Self-pay | Admitting: Family Medicine

## 2022-11-30 DIAGNOSIS — M5137 Other intervertebral disc degeneration, lumbosacral region: Secondary | ICD-10-CM

## 2022-12-01 ENCOUNTER — Inpatient Hospital Stay

## 2022-12-01 ENCOUNTER — Ambulatory Visit: Admitting: Urology

## 2022-12-01 ENCOUNTER — Inpatient Hospital Stay: Attending: Oncology | Admitting: Oncology

## 2022-12-01 ENCOUNTER — Encounter: Payer: Self-pay | Admitting: Oncology

## 2022-12-01 VITALS — BP 119/96 | HR 66 | Temp 97.5°F | Resp 18 | Wt 246.5 lb

## 2022-12-01 DIAGNOSIS — R1900 Intra-abdominal and pelvic swelling, mass and lump, unspecified site: Secondary | ICD-10-CM | POA: Diagnosis not present

## 2022-12-01 DIAGNOSIS — N433 Hydrocele, unspecified: Secondary | ICD-10-CM | POA: Insufficient documentation

## 2022-12-01 DIAGNOSIS — N50819 Testicular pain, unspecified: Secondary | ICD-10-CM | POA: Diagnosis not present

## 2022-12-01 DIAGNOSIS — Z139 Encounter for screening, unspecified: Secondary | ICD-10-CM

## 2022-12-01 DIAGNOSIS — R1909 Other intra-abdominal and pelvic swelling, mass and lump: Secondary | ICD-10-CM | POA: Insufficient documentation

## 2022-12-01 DIAGNOSIS — I700 Atherosclerosis of aorta: Secondary | ICD-10-CM | POA: Diagnosis not present

## 2022-12-01 DIAGNOSIS — R1030 Lower abdominal pain, unspecified: Secondary | ICD-10-CM | POA: Diagnosis not present

## 2022-12-01 LAB — CBC WITH DIFFERENTIAL/PLATELET
Abs Immature Granulocytes: 0.02 10*3/uL (ref 0.00–0.07)
Basophils Absolute: 0 10*3/uL (ref 0.0–0.1)
Basophils Relative: 1 %
Eosinophils Absolute: 0.1 10*3/uL (ref 0.0–0.5)
Eosinophils Relative: 2 %
HCT: 39.8 % (ref 39.0–52.0)
Hemoglobin: 13.2 g/dL (ref 13.0–17.0)
Immature Granulocytes: 0 %
Lymphocytes Relative: 42 %
Lymphs Abs: 2.3 10*3/uL (ref 0.7–4.0)
MCH: 30 pg (ref 26.0–34.0)
MCHC: 33.2 g/dL (ref 30.0–36.0)
MCV: 90.5 fL (ref 80.0–100.0)
Monocytes Absolute: 0.4 10*3/uL (ref 0.1–1.0)
Monocytes Relative: 8 %
Neutro Abs: 2.5 10*3/uL (ref 1.7–7.7)
Neutrophils Relative %: 47 %
Platelets: 253 10*3/uL (ref 150–400)
RBC: 4.4 MIL/uL (ref 4.22–5.81)
RDW: 13.2 % (ref 11.5–15.5)
WBC: 5.4 10*3/uL (ref 4.0–10.5)
nRBC: 0 % (ref 0.0–0.2)

## 2022-12-01 LAB — HEPATITIS PANEL, ACUTE
HCV Ab: NONREACTIVE
Hep A IgM: NONREACTIVE
Hep B C IgM: NONREACTIVE
Hepatitis B Surface Ag: NONREACTIVE

## 2022-12-01 LAB — LACTATE DEHYDROGENASE: LDH: 70 U/L — ABNORMAL LOW (ref 98–192)

## 2022-12-01 LAB — HIV ANTIBODY (ROUTINE TESTING W REFLEX): HIV Screen 4th Generation wRfx: NONREACTIVE

## 2022-12-01 NOTE — Progress Notes (Signed)
Hematology/Oncology Consult Note [phone] [phone]     REFERRING PROVIDER: Jacky Kindle, FNP    CHIEF COMPLAINTS/PURPOSE OF CONSULTATION:  Retroperitoneal mass  ASSESSMENT & PLAN:   Retroperitoneal mass Images were reviewed and discussed with patient. Differential diagnosis includes inflammation, lymphoma, germ cell tumor, lymph node metastasis from solid organs, etc. I will check CBC, LDH, smear, flow cytometry, HIV, hepatitis panel, AFP, hCG SPEP  Recommend PET scan for further evaluation of the hypermetabolic activity of the retroperitoneal mass. Discussed about need of biopsy of retroperitoneal mass or other more accessible sites if found on PET scan.  Pain in testicle Obtain scrotum ultrasound Doppler   Orders Placed This Encounter  Procedures   NM PET Image Initial (PI) Skull Base To Thigh    Standing Status:   Future    Standing Expiration Date:   12/01/2023    Order Specific Question:   If indicated for the ordered procedure, I authorize the administration of a radiopharmaceutical per Radiology protocol    Answer:   Yes    Order Specific Question:   Preferred imaging location?    Answer:   [facility]   US SCROTUM W/DOPPLER    Standing Status:   Future    Standing Expiration Date:   12/01/2023    Order Specific Question:   Reason for Exam (SYMPTOM  OR DIAGNOSIS REQUIRED)    Answer:   testicle pain and pressure.    Order Specific Question:   Preferred imaging location?    Answer:   [facility]   CBC with Differential/Platelet    Standing Status:   Future    Number of Occurrences:   1    Standing Expiration Date:   12/01/2023   Flow cytometry panel-leukemia/lymphoma work-up    Standing Status:   Future    Number of Occurrences:   1    Standing Expiration Date:   12/01/2023   Lactate dehydrogenase    Standing Status:   Future    Number of Occurrences:   1    Standing Expiration Date:   12/01/2023   HIV Antibody (routine  testing w rflx)    Standing Status:   Future    Number of Occurrences:   1    Standing Expiration Date:   12/01/2023   Hepatitis panel, acute    Standing Status:   Future    Number of Occurrences:   1    Standing Expiration Date:   12/01/2023   AFP tumor marker    Standing Status:   Future    Number of Occurrences:   1    Standing Expiration Date:   12/01/2023   Beta HCG, Quant (tumor marker)    Standing Status:   Future    Number of Occurrences:   1    Standing Expiration Date:   12/01/2023   Protein electrophoresis, serum    Standing Status:   Future    Number of Occurrences:   1    Standing Expiration Date:   12/01/2023   Ambulatory referral to Social Work    Referral Priority:   Routine    Referral Type:   Consultation    Referral Reason:   Specialty Services Required    Number of Visits Requested:   1   Follow-up 1 week after PET scan for discussion of results and further management plan. All questions were answered. The patient knows to call the clinic with any problems, questions or concerns.  Rickard Patience, MD, PhD  [facility] Hematology Oncology 12/01/2022    HISTORY OF PRESENTING ILLNESS:  [name] [name] 47 y.o. male presents to establish care for retroperitoneal mass  Patient reports that he has experienced lower back pain since January.  He has also had a 24 pound weight loss since January 2024.  Denies any night sweats, fever or chills.  Weight loss is partially intentional, due to increased exercise, diet.  Occasionally, he experienced nausea vomiting diarrhea episodes.  Approximately once per months.  He has also experienced testicle pressure/discomfort.  Patient reports that her primary care provider examined her testicles, physical examination was not remarkable.  10/17/2022 lumbar spine without contrast showed 1. 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 2.  Central disc protrusion and left paramidline annular tear at L5-S1 without significant stenosis. 3. Mild facet hypertrophy at L5-S1.   11/25/2022 CT abdomen pelvis with contrast Mild right hydroureteronephrosis is noted without obstructing calculus. There appears to be occlusion or compression of the midportion of the right ureter secondary to 3.4 x 2.1 cm retroperitoneal mass at the level of the aortic bifurcation concerning for malignancy. This mass also appears to be causing significant narrowing of the right common iliac artery. Bilateral L5 spondylolysis. Aortic Atherosclerosis     MEDICAL HISTORY:  Past Medical History:  Diagnosis Date   ADHD (attention deficit hyperactivity disorder)    Allergy    Arthritis    CAD (coronary artery disease)    S/P cath May 2012 with BMS to the LAD following abnormal stress test   Diabetes mellitus    Drug abuse (HCC)    history of marijuana use and alcohol   GERD (gastroesophageal reflux disease)    Hypercholesterolemia    Hypertension    Myocardial infarction (HCC)    Obesity    Oxygen deficiency    Sleep apnea     SURGICAL HISTORY: Past Surgical History:  Procedure Laterality Date   ABSCESS DRAINAGE     right arm; approx 2003   CARDIAC CATHETERIZATION     COLONOSCOPY WITH PROPOFOL N/A 10/10/2020   Procedure: COLONOSCOPY WITH PROPOFOL;  Surgeon: [name], MD;  Location: [facility];  Service: Gastroenterology;  Laterality: N/A;   CORONARY ANGIOPLASTY     CORONARY STENT PLACEMENT  May 2012   LAD    SOCIAL HISTORY: Social History   Socioeconomic History   Marital status: Married    Spouse name: Not on file   Number of children: 4   Years of education: Not on file   Highest education level: Not on file  Occupational History   Occupation: disabled  Tobacco Use   Smoking status: Former    Packs/day: 1.00    Years: 14.00    Additional pack years: 0.00    Total pack years: 14.00    Types: Cigarettes    Quit date:  06/2017    Years since quitting: 5.4   Smokeless tobacco: Never  Vaping Use   Vaping Use: Never used  Substance and Sexual Activity   Alcohol use: Not Currently    Alcohol/week: 10.0 standard drinks of alcohol    Types: 2 Cans of beer, 8 Shots of liquor per week    Comment: quit approx 09/2022   Drug use: Yes    Frequency: 20.0 times per week    Types: Marijuana   Sexual activity: Yes  Other Topics Concern   Not on file  Social History Narrative  Not on file   Social Determinants of Health   Financial Resource Strain: Not on file  Food Insecurity: Food Insecurity Present (12/01/2022)   Hunger Vital Sign    Worried About Running Out of Food in the Last Year: Sometimes true    Ran Out of Food in the Last Year: Sometimes true  Transportation Needs: No Transportation Needs (12/01/2022)   PRAPARE - Administrator, Civil Service (Medical): No    Lack of Transportation (Non-Medical): No  Physical Activity: Not on file  Stress: Not on file  Social Connections: Not on file  Intimate Partner Violence: Not At Risk (12/01/2022)   Humiliation, Afraid, Rape, and Kick questionnaire    Fear of Current or Ex-Partner: No    Emotionally Abused: No    Physically Abused: No    Sexually Abused: No    FAMILY HISTORY: Family History  Problem Relation Age of Onset   Hypertension Mother    Diabetes Mother    Breast cancer Mother    Multiple myeloma Mother    Hypertension Father    Diabetes Father    Heart disease Father        cabg 3; 1995; 37 stents   Hypertension Sister    Heart disease Paternal Uncle    Heart disease Paternal Grandmother     ALLERGIES:  is allergic to trulicity [dulaglutide] and penicillins.  MEDICATIONS:  Current Outpatient Medications  Medication Sig Dispense Refill   Accu-Chek Softclix Lancets lancets Use to check blood sugar daily for type 2 diabetes. E11.9 100 each 4   amLODipine (NORVASC) 10 MG tablet TAKE 1 TABLET(10 MG) BY MOUTH DAILY 90 tablet  1   aspirin 81 MG tablet Take 81 mg by mouth daily.     blood glucose meter kit and supplies Dispense based on patient and insurance preference. Use daily. (FOR ICD-10 E10.9, E11.9). 1 each 0   Blood Glucose Monitoring Suppl DEVI 1 each by Does not apply route in the morning, at noon, and at bedtime. May substitute to any manufacturer covered by patient's insurance. 1 each 0   Continuous Glucose Sensor (FREESTYLE LIBRE 3 SENSOR) MISC Place 1 sensor on the skin every 14 days. Use to check glucose continuously 2 each 12   ezetimibe (ZETIA) 10 MG tablet Take 1 tablet (10 mg total) by mouth daily. 90 tablet 1   gabapentin (NEURONTIN) 600 MG tablet Take 1 tablet (600 mg total) by mouth 3 (three) times daily. 90 tablet 0   glipiZIDE (GLIPIZIDE XL) 5 MG 24 hr tablet Take 1 tablet (5 mg total) by mouth 2 (two) times daily with a meal. 60 tablet 1   glucose blood (ONETOUCH VERIO) test strip Use as instructed 100 each 12   HYDROcodone-acetaminophen (NORCO) 5-325 MG tablet Take 2 tablets by mouth every 4 (four) hours. 60 tablet 0   insulin glargine (LANTUS SOLOSTAR) 100 UNIT/ML Solostar Pen 16 units nightly; check fasting blood sugar daily. Titrate dose every 3 days by 2 units to meet fasting blood sugar of 110-120. 15 mL 11   Insulin Pen Needle (PEN NEEDLES 5/16") 30G X 8 MM MISC 100 each by Does not apply route at bedtime. 100 each 3   Lancet Device MISC 1 each by Does not apply route 2 (two) times daily as needed. May substitute to any manufacturer covered by patient's insurance. 180 each 3   losartan (COZAAR) 25 MG tablet Take 1 tablet (25 mg total) by mouth daily. 90 tablet 1  methocarbamol (ROBAXIN) 750 MG tablet 1/2 TO 1 TABLET BY MOUTH TWICE DAILY AS NEEDED     metoprolol succinate (TOPROL-XL) 25 MG 24 hr tablet Take 1 tablet (25 mg total) by mouth daily. 90 tablet 1   ondansetron (ZOFRAN) 4 MG tablet Take 1 tablet (4 mg total) by mouth every 6 (six) hours as needed for up to 7 days for nausea or  vomiting. 20 tablet 0   pantoprazole (PROTONIX) 40 MG tablet TAKE 1 TABLET(40 MG) BY MOUTH DAILY 90 tablet 3   QUEtiapine (SEROQUEL) 50 MG tablet Take 50 mg by mouth at bedtime.     rosuvastatin (CRESTOR) 40 MG tablet TAKE 1 TABLET(40 MG) BY MOUTH DAILY 90 tablet 1   sitaGLIPtin (JANUVIA) 100 MG tablet Take 1 tablet (100 mg total) by mouth daily. 30 tablet 1   tamsulosin (FLOMAX) 0.4 MG CAPS capsule TAKE 2 CAPSULES(0.8 MG) BY MOUTH DAILY 180 capsule 3   dicyclomine (BENTYL) 20 MG tablet Take 1 tablet (20 mg total) by mouth every 6 (six) hours as needed for up to 7 days for spasms. (Patient not taking: Reported on 12/01/2022) 25 tablet 0   nitroGLYCERIN (NITROSTAT) 0.4 MG SL tablet Place 1 tablet (0.4 mg total) under the tongue every 5 (five) minutes as needed for chest pain. Call 9-1-1 if you take 3 tablets. (Patient not taking: Reported on 12/01/2022) 25 tablet 4   No current facility-administered medications for this visit.    Review of Systems  Constitutional:  Positive for unexpected weight change. Negative for chills, fatigue and fever.  HENT:   Negative for hearing loss and voice change.   Eyes:  Negative for eye problems and icterus.  Respiratory:  Negative for chest tightness, cough and shortness of breath.   Cardiovascular:  Negative for chest pain and leg swelling.  Gastrointestinal:  Negative for abdominal distention and abdominal pain.  Endocrine: Negative for hot flashes.  Genitourinary:  Negative for difficulty urinating, dysuria and frequency.        Testicle discomfort/pressure.  Musculoskeletal:  Positive for back pain. Negative for arthralgias.  Skin:  Negative for itching and rash.  Neurological:  Negative for light-headedness and numbness.  Hematological:  Negative for adenopathy. Does not bruise/bleed easily.  Psychiatric/Behavioral:  Negative for confusion.      PHYSICAL EXAMINATION: ECOG PERFORMANCE STATUS: 1 - Symptomatic but completely ambulatory  Vitals:    12/01/22 0932  BP: (!) 119/96  Pulse: 66  Resp: 18  Temp: (!) 97.5 F (36.4 C)   Filed Weights   12/01/22 0932  Weight: 246 lb 8 oz (111.8 kg)    Physical Exam Constitutional:      General: He is not in acute distress.    Appearance: He is not diaphoretic.  HENT:     Head: Normocephalic and atraumatic.     Nose: Nose normal.     Mouth/Throat:     Pharynx: No oropharyngeal exudate.  Eyes:     General: No scleral icterus.    Pupils: Pupils are equal, round, and reactive to light.  Cardiovascular:     Rate and Rhythm: Normal rate and regular rhythm.     Heart sounds: No murmur heard. Pulmonary:     Effort: Pulmonary effort is normal. No respiratory distress.     Breath sounds: No rales.  Chest:     Chest wall: No tenderness.  Abdominal:     General: There is no distension.     Palpations: Abdomen is soft.  Tenderness: There is no abdominal tenderness.  Musculoskeletal:        General: Normal range of motion.     Cervical back: Normal range of motion and neck supple.  Skin:    General: Skin is warm and dry.     Findings: No erythema.  Neurological:     Mental Status: He is alert and oriented to person, place, and time.     Cranial Nerves: No cranial nerve deficit.     Motor: No abnormal muscle tone.     Coordination: Coordination normal.  Psychiatric:        Mood and Affect: Affect normal.      LABORATORY DATA:  I have reviewed the data as listed    Latest Ref Rng & Units 12/01/2022   10:03 AM 11/25/2022    6:31 PM 09/24/2022    1:49 PM  CBC  WBC 4.0 - 10.5 K/uL 5.4  6.9  6.3   Hemoglobin 13.0 - 17.0 g/dL 44.0  34.7  42.5   Hematocrit 39.0 - 52.0 % 39.8  44.3  41.5   Platelets 150 - 400 K/uL 253  267  232       Latest Ref Rng & Units 11/25/2022    6:31 PM 09/24/2022    1:49 PM 02/04/2022   11:43 AM  CMP  Glucose 70 - 99 mg/dL 956  387  564   BUN 6 - 20 mg/dL 14  8  9    Creatinine 0.61 - 1.24 mg/dL 3.32  9.51  8.84   Sodium 135 - 145 mmol/L 140  138   141   Potassium 3.5 - 5.1 mmol/L 3.4  4.4  4.7   Chloride 98 - 111 mmol/L 109  101  104   CO2 22 - 32 mmol/L 16  22  20    Calcium 8.9 - 10.3 mg/dL 9.5  16.6  9.5   Total Protein 6.5 - 8.1 g/dL 8.7  6.9  7.0   Total Bilirubin 0.3 - 1.2 mg/dL 0.9  0.3  0.3   Alkaline Phos 38 - 126 U/L 52  81  72   AST 15 - 41 U/L 20  19  34   ALT 0 - 44 U/L 20  27  55      RADIOGRAPHIC STUDIES: I have personally reviewed the radiological images as listed and agreed with the findings in the report. CT ABDOMEN PELVIS W CONTRAST  Result Date: 11/25/2022 CLINICAL DATA:  Acute lower abdominal pain. EXAM: CT ABDOMEN AND PELVIS WITH CONTRAST TECHNIQUE: Multidetector CT imaging of the abdomen and pelvis was performed using the standard protocol following bolus administration of intravenous contrast. RADIATION DOSE REDUCTION: This exam was performed according to the departmental dose-optimization program which includes automated exposure control, adjustment of the mA and/or kV according to patient size and/or use of iterative reconstruction technique. CONTRAST:  OMNIPAQUE IOHEXOL 300 MG/ML  SOLN COMPARISON:  July 25, 2020. FINDINGS: Lower chest: No acute abnormality. Hepatobiliary: No focal liver abnormality is seen. No gallstones, gallbladder wall thickening, or biliary dilatation. Pancreas: Unremarkable. No pancreatic ductal dilatation or surrounding inflammatory changes. Spleen: Normal in size without focal abnormality. Adrenals/Urinary Tract: Adrenal glands appear normal. Left kidney and ureter are unremarkable. Mild right hydronephrosis and proximal ureteral dilatation is noted without ureteral calculus. There appears to be occlusion of the midportion of the right ureter secondary to 3.4 x 2.1 cm retroperitoneal mass at the level of the aortic bifurcation concerning for malignancy. Urinary bladder is decompressed. Stomach/Bowel:  Stomach is within normal limits. Appendix appears normal. No evidence of bowel wall  thickening, distention, or inflammatory changes. Vascular/Lymphatic: Aortic atherosclerosis. Retroperitoneal mass around aortic bifurcation as described above. This does appear to be resulting in narrowing of the right common iliac artery. Reproductive: Prostate is unremarkable. Other: No abdominal wall hernia or abnormality. No abdominopelvic ascites. Musculoskeletal: Bilateral L5 spondylolysis. No acute osseous abnormality is noted. IMPRESSION: Mild right hydroureteronephrosis is noted without obstructing calculus. There appears to be occlusion or compression of the midportion of the right ureter secondary to 3.4 x 2.1 cm retroperitoneal mass at the level of the aortic bifurcation concerning for malignancy. This mass also appears to be causing significant narrowing of the right common iliac artery. Bilateral L5 spondylolysis. Aortic Atherosclerosis (ICD10-I70.0). Electronically Signed   By: Lupita Raider M.D.   On: 11/25/2022 20:03

## 2022-12-01 NOTE — Assessment & Plan Note (Addendum)
Obtain scrotum ultrasound Doppler

## 2022-12-01 NOTE — Assessment & Plan Note (Addendum)
Images were reviewed and discussed with patient. Differential diagnosis includes inflammation, lymphoma, germ cell tumor, lymph node metastasis from solid organs, etc. I will check CBC, LDH, smear, flow cytometry, HIV, hepatitis panel, AFP, hCG SPEP  Recommend PET scan for further evaluation of the hypermetabolic activity of the retroperitoneal mass. Discussed about need of biopsy of retroperitoneal mass or other more accessible sites if found on PET scan.

## 2022-12-02 LAB — AFP TUMOR MARKER: AFP, Serum, Tumor Marker: 2.1 ng/mL (ref 0.0–6.9)

## 2022-12-03 ENCOUNTER — Other Ambulatory Visit: Payer: Self-pay

## 2022-12-03 ENCOUNTER — Inpatient Hospital Stay

## 2022-12-03 LAB — COMP PANEL: LEUKEMIA/LYMPHOMA

## 2022-12-03 LAB — PROTEIN ELECTROPHORESIS, SERUM
A/G Ratio: 1.1 (ref 0.7–1.7)
Albumin ELP: 3.9 g/dL (ref 2.9–4.4)
Alpha-1-Globulin: 0.2 g/dL (ref 0.0–0.4)
Alpha-2-Globulin: 1 g/dL (ref 0.4–1.0)
Beta Globulin: 1.2 g/dL (ref 0.7–1.3)
Gamma Globulin: 1.1 g/dL (ref 0.4–1.8)
Globulin, Total: 3.4 g/dL (ref 2.2–3.9)
Total Protein ELP: 7.3 g/dL (ref 6.0–8.5)

## 2022-12-03 LAB — BETA HCG QUANT (REF LAB): hCG Quant: <1 m[IU]/mL (ref 0–3)

## 2022-12-03 MED ORDER — HYDROCODONE-ACETAMINOPHEN 5-325 MG PO TABS
2.0000 | ORAL_TABLET | ORAL | 0 refills | Status: DC
Start: 2022-12-03 — End: 2022-12-21
  Filled 2022-12-03 – 2022-12-15 (×3): qty 60, 5d supply, fill #0

## 2022-12-03 NOTE — Progress Notes (Signed)
[facility] Clinical Social Work  Initial Assessment   [name] [name] is a 47 y.o. year old male contacted by phone. Clinical Social Work was referred by medical provider for assessment of psychosocial needs.   SDOH (Social Determinants of Health) assessments performed: Yes SDOH Interventions    Flowsheet Row Office Visit from 10/29/2021 in [facility]  SDOH Interventions   Depression Interventions/Treatment  PHQ2-9 Score <4 Follow-up Not Indicated       SDOH Screenings   Food Insecurity: Food Insecurity Present (12/01/2022)  Housing: Low Risk  (12/01/2022)  Transportation Needs: No Transportation Needs (12/01/2022)  Utilities: Not At Risk (12/01/2022)  Alcohol Screen: Low Risk  (09/24/2022)  Depression (PHQ2-9): Low Risk  (12/01/2022)  Tobacco Use: Medium Risk (12/01/2022)     Distress Screen completed: No    12/01/2022    9:29 AM  ONCBCN DISTRESS SCREENING  Screening Type Initial Screening  Distress experienced in past week (1-10) 0      Family/Social Information:  Housing Arrangement: patient lives with his wife and children. Family members/support persons in your life? Family.  He has a 45 y/o and 45 y/o. Transportation concerns: no  Employment: Unemployed  Income source: Supported by [employer] and [employer] Financial concerns: Yes, due to illness and/or loss of work during treatment Type of concern: Designer, industrial/product access concerns: yes Religious or spiritual practice: Not known Services Currently in place:  Medicaid and [employer] (father was a Cytogeneticist)  Coping/ Adjustment to diagnosis: Patient understands treatment plan and what happens next? Pending treatment plan. Concerns about diagnosis and/or treatment:  The unknown. Patient reported stressors: Therapist, art and/or priorities: Family Patient enjoys time with family/ friends Current coping skills/ strengths: Average or above average intelligence , Manufacturing systems engineer , General fund of knowledge , and  Supportive family/friends     SUMMARY: Current SDOH Barriers:  Financial constraints related to unemployment.  Clinical Social Work Clinical Goal(s):  Explore community resource options for unmet needs related to:  Financial Strain   Interventions: Discussed common feeling and emotions when being diagnosed with cancer, and the importance of support during treatment Informed patient of the support team roles and support services at [facility] Provided CSW contact information and encouraged patient to call with any questions or concerns Provided patient with information about regarding resources available once treatment begins.  Provided patient with CSW contact information and made referral for food resources with [email or web address].   Follow Up Plan: Patient will contact CSW with any support or resource needs Patient verbalizes understanding of plan: Yes    Dorothey Baseman, LCSW Clinical Social Worker [facility]  Patient is participating in a Managed Medicaid Plan:  Yes

## 2022-12-07 ENCOUNTER — Other Ambulatory Visit: Payer: Self-pay | Admitting: Family Medicine

## 2022-12-07 ENCOUNTER — Ambulatory Visit
Admission: RE | Admit: 2022-12-07 | Discharge: 2022-12-07 | Disposition: A | Discharge status: Home / Self Care | Source: Ambulatory Visit | Attending: Oncology | Admitting: Oncology

## 2022-12-07 ENCOUNTER — Telehealth: Payer: Self-pay | Admitting: Family Medicine

## 2022-12-07 DIAGNOSIS — N50819 Testicular pain, unspecified: Secondary | ICD-10-CM | POA: Diagnosis present

## 2022-12-07 DIAGNOSIS — E1140 Type 2 diabetes mellitus with diabetic neuropathy, unspecified: Secondary | ICD-10-CM

## 2022-12-07 DIAGNOSIS — K2100 Gastro-esophageal reflux disease with esophagitis, without bleeding: Secondary | ICD-10-CM

## 2022-12-07 DIAGNOSIS — M5137 Other intervertebral disc degeneration, lumbosacral region: Secondary | ICD-10-CM

## 2022-12-07 NOTE — Telephone Encounter (Signed)
Medication Refill - Medication: pantoprazole (PROTONIX) 40 MG tablet sitaGLIPtin (JANUVIA) 100 MG tablet  HYDROcodone-acetaminophen (NORCO) 5-325 MG tablet (this medication was sent to the wrong pharmacy and pt would like it routed to [facility])  Pt is out of all three medications.   Has the patient contacted their pharmacy? No. Preferred Pharmacy (with phone number or street name): [id number] - [place], [place] - [address] AT [facility] [address] [facility]  Phone: [phone] Fax: [phone]  Has the patient been seen for an appointment in the last year OR does the patient have an upcoming appointment? Yes.    Agent: Please be advised that RX refills may take up to 3 business days. We ask that you follow-up with your pharmacy.

## 2022-12-07 NOTE — Telephone Encounter (Signed)
Created in error

## 2022-12-08 ENCOUNTER — Ambulatory Visit

## 2022-12-08 MED ORDER — SITAGLIPTIN PHOSPHATE 100 MG PO TABS
100.0000 mg | ORAL_TABLET | Freq: Every day | ORAL | 0 refills | Status: DC
Start: 2022-12-08 — End: 2023-07-29

## 2022-12-08 MED ORDER — PANTOPRAZOLE SODIUM 40 MG PO TBEC
DELAYED_RELEASE_TABLET | ORAL | 0 refills | Status: DC
Start: 2022-12-08 — End: 2023-03-16

## 2022-12-08 NOTE — Telephone Encounter (Signed)
Requested medication (s) are due for refill today: yes, resend to [facility] pharmacy  Requested medication (s) are on the active medication list: yes  Last refill:  12/03/22  Future visit scheduled: no  Notes to clinic:  Unable to refill per protocol, cannot delegate.      Requested Prescriptions  Pending Prescriptions Disp Refills   HYDROcodone-acetaminophen (NORCO) 5-325 MG tablet 60 tablet 0    Sig: Take 2 tablets by mouth every 4 (four) hours.     Not Delegated - Analgesics:  Opioid Agonist Combinations Failed - 12/07/2022  5:56 PM      Failed - This refill cannot be delegated      Failed - Urine Drug Screen completed in last 360 days      Passed - Valid encounter within last 3 months    Recent Outpatient Visits           1 month ago Epididymitis   [facility] Malva Limes, MD   1 month ago Type 2 diabetes mellitus with diabetic neuropathy, with long-term current use of insulin [facility])   [facility] Merita Norton T, FNP   2 months ago Type 2 diabetes mellitus with diabetic neuropathy, without long-term current use of insulin (HCC)   [place] [facility] Malva Limes, MD   2 months ago Uncontrolled diabetes mellitus of other type with hypoglycemia, unspecified hypoglycemia coma status [facility])   [place] [facility] Merita Norton T, FNP   7 months ago Type 2 diabetes mellitus with diabetic neuropathy, without long-term current use of insulin [facility])   [place] Jacky Kindle, FNP       Future Appointments             In 3 weeks [name], MD [facility]            Signed Prescriptions Disp Refills   pantoprazole (PROTONIX) 40 MG tablet 90 tablet 0    Sig: TAKE 1 TABLET(40 MG) BY MOUTH DAILY     Gastroenterology: Proton Pump Inhibitors Passed - 12/07/2022  5:56 PM      Passed - Valid encounter within last  12 months    Recent Outpatient Visits           1 month ago Epididymitis   [facility] Mal[facility] Limes, MD   1 month ago Type 2 diabetes mellitus with diabetic neuropathy, with long-term current use of insulin [facility])   [place] [facility] Merita Norton T, FNP   2 months ago Type 2 diabetes mellitus with diabetic neuropathy, without long-term current use of insulin (HCC)   [place] [facility] Malva Limes, MD   2 months ago Uncontrolled diabetes mellitus of other type with hypoglycemia, unspecified hypoglycemia coma status [facility])   [place] [facility] Merita Norton T, FNP   7 months ago Type 2 diabetes mellitus with diabetic neuropathy, without long-term current use of insulin [facility])   [place] [facility] Merita Norton T, FNP       Future Appointments             In 3 weeks [name], MD [facility] [place] [facility]             sitaGLIPtin (JANUVIA) 100 MG tablet 90 tablet 0    Sig: Take 1 tablet (100 mg total) by mouth daily.  Endocrinology:  Diabetes - DPP-4 Inhibitors Failed - 12/07/2022  5:56 PM      Failed - HBA1C is between 0 and 7.9 and within 180 days    Hemoglobin A1C  Date Value Ref Range Status  10/28/2022 12.3 (A) 4.0 - 5.6 % Final   Hgb A1c MFr Bld  Date Value Ref Range Status  09/24/2022 12.6 (H) 4.8 - 5.6 % Final    Comment:             Prediabetes: 5.7 - 6.4          Diabetes: >6.4          Glycemic control for adults with diabetes: <7.0          Passed - Cr in normal range and within 360 days    Creat  Date Value Ref Range Status  10/13/2010 0.78 0.40 - 1.50 mg/dL Final   Creatinine, Ser  Date Value Ref Range Status  11/25/2022 0.99 0.61 - 1.24 mg/dL Final         Passed - Valid encounter within last 6 months    Recent Outpatient Visits           1 month ago Epididymitis   [facility]  Malva Limes, MD   1 month ago Type 2 diabetes mellitus with diabetic neuropathy, with long-term current use of insulin [facility])   [place] Mer[facility] T, FNP   2 months ago Type 2 diabetes mellitus with diabetic neuropathy, without long-term current use of insulin [facility])   [place] [facility] Malva Limes, MD   2 months ago Uncontrolled diabetes mellitus of other type with hypoglycemia, unspecified hypoglycemia coma status [facility])   [facility] Merita Norton T, FNP   7 months ago Type 2 diabetes mellitus with diabetic neuropathy, without long-term current use of insulin [facility])   [place] [facility] Jacky Kindle, FNP       Future Appointments             In 3 weeks [name], MD [facility] [place] [facility] at [facility]

## 2022-12-08 NOTE — Telephone Encounter (Signed)
Requested Prescriptions  Pending Prescriptions Disp Refills   HYDROcodone-acetaminophen (NORCO) 5-325 MG tablet 60 tablet 0    Sig: Take 2 tablets by mouth every 4 (four) hours.     Not Delegated - Analgesics:  Opioid Agonist Combinations Failed - 12/07/2022  5:56 PM      Failed - This refill cannot be delegated      Failed - Urine Drug Screen completed in last 360 days      Passed - Valid encounter within last 3 months    Recent Outpatient Visits           1 month ago Epididymitis   [facility] Malva Limes, MD   1 month ago Type 2 diabetes mellitus with diabetic neuropathy, with long-term current use of insulin [facility])   [place] [facility] Merita Norton T, FNP   2 months ago Type 2 diabetes mellitus with diabetic neuropathy, without long-term current use of insulin (HCC)   [place] [facility] Malva Limes, MD   2 months ago Uncontrolled diabetes mellitus of other type with hypoglycemia, unspecified hypoglycemia coma status [facility])   [place] [facility] Merita Norton T, FNP   7 months ago Type 2 diabetes mellitus with diabetic neuropathy, without long-term current use of insulin [facility])   [place] [facility] Jacky Kindle, FNP       Future Appointments             In 3 weeks [name], MD [facility] [place] [facility] at [facility]             pantoprazole (PROTONIX) 40 MG tablet 90 tablet 0    Sig: TAKE 1 TABLET(40 MG) BY MOUTH DAILY     Gastroenterology: Proton Pump Inhibitors Passed - 12/07/2022  5:56 PM      Passed - Valid encounter within last 12 months    Recent Outpatient Visits           1 month ago Epididymitis   [facility] Malva Limes, MD   1 month ago Type 2 diabetes mellitus with diabetic neuropathy, with long-term current use of insulin [facility])   [place] [facility] Merita Norton T, FNP   2  months ago Type 2 diabetes mellitus with diabetic neuropathy, without long-term current use of insulin (HCC)   [place] [facility] Malva Limes, MD   2 months ago Uncontrolled diabetes mellitus of other type with hypoglycemia, unspecified hypoglycemia coma status [facility])   [place] [facility] Merita Norton T, FNP   7 months ago Type 2 diabetes mellitus with diabetic neuropathy, without long-term current use of insulin [facility])   [place] [facility] Merita Norton T, FNP       Future Appointments             In 3 weeks [name], MD [facility] [place] [facility] at [facility]             sitaGLIPtin (JANUVIA) 100 MG tablet 90 tablet 0    Sig: Take 1 tablet (100 mg total) by mouth daily.     Endocrinology:  Diabetes - DPP-4 Inhibitors Failed - 12/07/2022  5:56 PM      Failed - HBA1C is between 0 and 7.9 and within 180 days    Hemoglobin A1C  Date Value Ref Range Status  10/28/2022 12.3 (A) 4.0 - 5.6 % Final   Hgb  A1c MFr Bld  Date Value Ref Range Status  09/24/2022 12.6 (H) 4.8 - 5.6 % Final    Comment:             Prediabetes: 5.7 - 6.4          Diabetes: >6.4          Glycemic control for adults with diabetes: <7.0          Passed - Cr in normal range and within 360 days    Creat  Date Value Ref Range Status  10/13/2010 0.78 0.40 - 1.50 mg/dL Final   Creatinine, Ser  Date Value Ref Range Status  11/25/2022 0.99 0.61 - 1.24 mg/dL Final         Passed - Valid encounter within last 6 months    Recent Outpatient Visits           1 month ago Epididymitis   [facility] Mal[facility] Limes, MD   1 month ago Type 2 diabetes mellitus with diabetic neuropathy, with long-term current use of insulin [facility])   [facility] Merita Norton T, FNP   2 months ago Type 2 diabetes mellitus with diabetic neuropathy, without long-term current use of insulin [facility])   [facility] L[facility], MD   2 months ago Uncontrolled diabetes mellitus of other type with hypoglycemia, unspecified hypoglycemia coma status [facility])   [place] [facility] Merita Norton T, FNP   7 months ago Type 2 diabetes mellitus with diabetic neuropathy, without long-term current use of insulin [facility])    [facility] Jacky Kindle, FNP       Future Appointments             In 3 weeks [name], MD [facility] [place] [facility] at [facility]

## 2022-12-09 ENCOUNTER — Telehealth: Payer: Self-pay

## 2022-12-09 DIAGNOSIS — N50819 Testicular pain, unspecified: Secondary | ICD-10-CM

## 2022-12-09 DIAGNOSIS — R1900 Intra-abdominal and pelvic swelling, mass and lump, unspecified site: Secondary | ICD-10-CM

## 2022-12-09 DIAGNOSIS — R9389 Abnormal findings on diagnostic imaging of other specified body structures: Secondary | ICD-10-CM

## 2022-12-09 NOTE — Telephone Encounter (Signed)
-----   Message from Rickard Patience, MD sent at 12/08/2022  9:56 PM EDT ----- Please let him know that US scrotum showed build up of fluid around his testicles.  Recommend urology eval. Refer if he agrees. Thanks.

## 2022-12-09 NOTE — Progress Notes (Signed)
   Care Guide Note  12/09/2022 Name: Coen Grosz MRN: [id number] DOB: 01-17-1976  Referred by: Jacky Kindle, FNP Reason for referral : Care Coordination (Outreach to reschedule with Pharm d )   Jolon Kattner is a 47 y.o. year old male who is a primary care patient of Jacky Kindle, FNP. Toa Marcelin was referred to the pharmacist for assistance related to DM.    Successful contact was made with the patient to discuss pharmacy services. Patient declines engagement at this time. Contact information was provided to the patient should they wish to reach out for assistance at a later time.  Penne Lash, RMA Care Guide [facility]  [place], [place] [address] Direct Dial: [phone] [email or web address]

## 2022-12-09 NOTE — Telephone Encounter (Signed)
Called pt and left VM on ph. Called wife and informed her of MD recommendation. She verbalized understanding.   Urology referral placed

## 2022-12-15 ENCOUNTER — Other Ambulatory Visit (HOSPITAL_COMMUNITY): Payer: Self-pay

## 2022-12-15 ENCOUNTER — Other Ambulatory Visit: Payer: Self-pay

## 2022-12-15 ENCOUNTER — Ambulatory Visit
Admission: RE | Admit: 2022-12-15 | Discharge: 2022-12-15 | Disposition: A | Discharge status: Home / Self Care | Source: Ambulatory Visit | Attending: Oncology | Admitting: Oncology

## 2022-12-15 ENCOUNTER — Telehealth: Payer: Self-pay | Admitting: *Deleted

## 2022-12-15 ENCOUNTER — Telehealth: Payer: Self-pay | Admitting: Family Medicine

## 2022-12-15 DIAGNOSIS — I700 Atherosclerosis of aorta: Secondary | ICD-10-CM | POA: Diagnosis not present

## 2022-12-15 DIAGNOSIS — K682 Retroperitoneal fibrosis: Secondary | ICD-10-CM | POA: Insufficient documentation

## 2022-12-15 DIAGNOSIS — I2510 Atherosclerotic heart disease of native coronary artery without angina pectoris: Secondary | ICD-10-CM | POA: Diagnosis not present

## 2022-12-15 DIAGNOSIS — N1330 Unspecified hydronephrosis: Secondary | ICD-10-CM | POA: Insufficient documentation

## 2022-12-15 DIAGNOSIS — R1900 Intra-abdominal and pelvic swelling, mass and lump, unspecified site: Secondary | ICD-10-CM | POA: Insufficient documentation

## 2022-12-15 LAB — GLUCOSE, CAPILLARY: Glucose-Capillary: 104 mg/dL — ABNORMAL HIGH (ref 70–99)

## 2022-12-15 MED ORDER — FLUDEOXYGLUCOSE F - 18 (FDG) INJECTION
12.0000 | Freq: Once | INTRAVENOUS | Status: AC
  Administered 2022-12-15: 12.53 via INTRAVENOUS

## 2022-12-15 NOTE — Progress Notes (Unsigned)
Referring Physician:  Jacky Kindle, FNP [id number] [place],  [place] [address]  Primary Physician:  Jacky Kindle, FNP  History of Present Illness: Mr. [name] [name] has a history of HTN, DM, history of GI bleed, CAD with stents, MI, OSA, hyperlipidemia, and history of substance abuse.  He was last seen by me on 11/05/22 for LBP with radiation to right groin/testicles and anterior thigh pain to his knee. No left leg pain, but some pain in left testicular area. He has  known DDD L5-S1 with modic changes and facet hypertrophy. Broad based disc at L4-L5 with only minimal lateral recess stenosis.    LBP is likely due to DDD L5-S1. Etiology of leg pain was not clear on MRI scan.   He was sent to PT (did not go?***) and was to work on getting his HgbA1c down.   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 He is seeing Heme/onc for this.   He is here for follow up.         He has 6+ month history of constant LBP with radiation to right groin/testicles and anterior thigh pain to his knee. No left leg pain, but some pain in left testicular area. Pain is worse with laying flat. He is not sleeping well. Minimal relief with changing positions. He notes weakness in both legs. No numbness or tingling.   No NSAIDs due to history of GI bleed. He is on neurontin, robaxin, and norco from PMR- not much relief with these.   Last HgbA1c on 10/28/22 was 12.3.   Bowel/Bladder Dysfunction: none  He does not use tobacco.   Conservative measures:  Physical therapy: referred by PMR at visit on 10/20/22, has seen chiropractor Multimodal medical therapy including regular antiinflammatories: neurontin, robaxin, norco  Injections:  07/08/2018: Right L5-S1 and left S1 transforaminal ESI (moderate relief)   Past Surgery: denies  Dasan Hardman has no symptoms of cervical myelopathy.  The symptoms are causing a significant impact on the patient's life.   Review of  Systems:  A 10 point review of systems is negative, except for the pertinent positives and negatives detailed in the HPI.  Past Medical History: Past Medical History:  Diagnosis Date   ADHD (attention deficit hyperactivity disorder)    Allergy    Arthritis    CAD (coronary artery disease)    S/P cath May 2012 with BMS to the LAD following abnormal stress test   Diabetes mellitus    Drug abuse (HCC)    history of marijuana use and alcohol   GERD (gastroesophageal reflux disease)    Hypercholesterolemia    Hypertension    Myocardial infarction (HCC)    Obesity    Oxygen deficiency    Sleep apnea     Past Surgical History: Past Surgical History:  Procedure Laterality Date   ABSCESS DRAINAGE     right arm; approx 2003   CARDIAC CATHETERIZATION     COLONOSCOPY WITH PROPOFOL N/A 10/10/2020   Procedure: COLONOSCOPY WITH PROPOFOL;  Surgeon: [name], MD;  [facility];  Service: Gastroenterology;  Laterality: N/A;   CORONARY ANGIOPLASTY     CORONARY STENT PLACEMENT  May 2012   LAD    Allergies: Allergies as of 12/22/2022 - Review Complete 12/01/2022  Allergen Reaction Noted   Trulicity [dulaglutide] Diarrhea and Nausea And Vomiting 10/14/2020   Penicillins  10/01/2010    Medications: Outpatient Encounter Medications as of 12/22/2022  Medication Sig   Accu-Chek Softclix  Lancets lancets Use to check blood sugar daily for type 2 diabetes. E11.9   amLODipine (NORVASC) 10 MG tablet TAKE 1 TABLET(10 MG) BY MOUTH DAILY   aspirin 81 MG tablet Take 81 mg by mouth daily.   blood glucose meter kit and supplies Dispense based on patient and insurance preference. Use daily. (FOR ICD-10 E10.9, E11.9).   Blood Glucose Monitoring Suppl DEVI 1 each by Does not apply route in the morning, at noon, and at bedtime. May substitute to any manufacturer covered by patient's insurance.   Continuous Glucose Sensor (FREESTYLE LIBRE 3 SENSOR) MISC Place 1 sensor on the skin every 14 days.  Use to check glucose continuously   dicyclomine (BENTYL) 20 MG tablet Take 1 tablet (20 mg total) by mouth every 6 (six) hours as needed for up to 7 days for spasms. (Patient not taking: Reported on 12/01/2022)   ezetimibe (ZETIA) 10 MG tablet Take 1 tablet (10 mg total) by mouth daily.   gabapentin (NEURONTIN) 600 MG tablet Take 1 tablet (600 mg total) by mouth 3 (three) times daily.   glipiZIDE (GLIPIZIDE XL) 5 MG 24 hr tablet Take 1 tablet (5 mg total) by mouth 2 (two) times daily with a meal.   glucose blood (ONETOUCH VERIO) test strip Use as instructed   HYDROcodone-acetaminophen (NORCO) 5-325 MG tablet Take 2 tablets by mouth every 4 (four) hours.   insulin glargine (LANTUS SOLOSTAR) 100 UNIT/ML Solostar Pen 16 units nightly; check fasting blood sugar daily. Titrate dose every 3 days by 2 units to meet fasting blood sugar of 110-120.   Insulin Pen Needle (PEN NEEDLES 5/16") 30G X 8 MM MISC 100 each by Does not apply route at bedtime.   Lancet Device MISC 1 each by Does not apply route 2 (two) times daily as needed. May substitute to any manufacturer covered by patient's insurance.   losartan (COZAAR) 25 MG tablet Take 1 tablet (25 mg total) by mouth daily.   methocarbamol (ROBAXIN) 750 MG tablet 1/2 TO 1 TABLET BY MOUTH TWICE DAILY AS NEEDED   metoprolol succinate (TOPROL-XL) 25 MG 24 hr tablet Take 1 tablet (25 mg total) by mouth daily.   nitroGLYCERIN (NITROSTAT) 0.4 MG SL tablet Place 1 tablet (0.4 mg total) under the tongue every 5 (five) minutes as needed for chest pain. Call 9-1-1 if you take 3 tablets. (Patient not taking: Reported on 12/01/2022)   pantoprazole (PROTONIX) 40 MG tablet TAKE 1 TABLET(40 MG) BY MOUTH DAILY   QUEtiapine (SEROQUEL) 50 MG tablet Take 50 mg by mouth at bedtime.   rosuvastatin (CRESTOR) 40 MG tablet TAKE 1 TABLET(40 MG) BY MOUTH DAILY   sitaGLIPtin (JANUVIA) 100 MG tablet Take 1 tablet (100 mg total) by mouth daily.   tamsulosin (FLOMAX) 0.4 MG CAPS capsule TAKE  2 CAPSULES(0.8 MG) BY MOUTH DAILY   No facility-administered encounter medications on file as of 12/22/2022.    Social History: Social History   Tobacco Use   Smoking status: Former    Packs/day: 1.00    Years: 14.00    Additional pack years: 0.00    Total pack years: 14.00    Types: Cigarettes    Quit date: 06/2017    Years since quitting: 5.5   Smokeless tobacco: Never  Vaping Use   Vaping Use: Never used  Substance Use Topics   Alcohol use: Not Currently    Alcohol/week: 10.0 standard drinks of alcohol    Types: 2 Cans of beer, 8 Shots of liquor per week  Comment: quit approx 09/2022   Drug use: Yes    Frequency: 20.0 times per week    Types: Marijuana    Family Medical History: Family History  Problem Relation Age of Onset   Hypertension Mother    Diabetes Mother    Breast cancer Mother    Multiple myeloma Mother    Hypertension Father    Diabetes Father    Heart disease Father        cabg 3; 54; 33 stents   Hypertension Sister    Heart disease Paternal Uncle    Heart disease Paternal Grandmother     Physical Examination: There were no vitals filed for this visit.    Awake, alert, oriented to person, place, and time.  Speech is clear and fluent. Fund of knowledge is appropriate.   Cranial Nerves: Pupils equal round and reactive to light.  Facial tone is symmetric.    Lilmited ROM of lumbar spine with pain on extension He has lower posterior lumbar tenderness.   No abnormal lesions on exposed skin.   Strength: Side Biceps Triceps Deltoid Interossei Grip Wrist Ext. Wrist Flex.  R 5 5 5 5 5 5 5   L 5 5 5 5 5 5 5    Side Iliopsoas Quads Hamstring PF DF EHL  R 5 5 5 5 5 5   L 5 5 5 5 5 5    Reflexes are 2+ and symmetric at the biceps, triceps, brachioradialis, patella and achilles.   Hoffman's is absent.  Clonus is not present.   Bilateral upper and lower extremity sensation is intact to light touch.     Gait is normal.     Medical Decision  Making  Imaging: none   Assessment and Plan: Mr. Leite is a pleasant 47 y.o. male has 6+ month history of constant LBP with radiation to right groin/testicles and anterior thigh pain to his knee. No left leg pain, but some pain in left testicular area.   He has DDD L5-S1 with modic changes and facet hypertrophy. Broad based disc at L4-L5 with only minimal lateral recess stenosis.   LBP is likely due to DDD L5-S1. Etiology of leg pain not clear on MRI scan.   Treatment options discussed with Dr. Myer Haff and following plan made with patient:   - Recommend he continue with conservative care including PT and injections when able. - Order for physical therapy for lumbar spine to [facility]. Patient to call to schedule appointment.  - Last HgbA1c on 10/28/22 was 12.3. He is working on this with PCP. Recommend he have ESIs when able to do so.  - Continue current medications including neurontin, robaxin, and norco from PMR.  - No NSAIDs due to history of GI bleed.  - If no improvement with conservative treatments, he  may be candidate for surgery at L5-S1. This would help his back pain but not his leg pain.  - He would need to complete PT prior to any surgery discussion and his HgbA1c would need to be 7.5.  - He will follow up with me in 6-8 weeks for recheck.   I spent a total of 40 minutes in face-to-face and non-face-to-face activities related to this patient's care today including review of outside records, review of imaging, review of symptoms, physical exam, discussion of differential diagnosis, discussion of treatment options, and documentation.   Thank you for involving me in the care of this patient.   Drake Leach PA-C Dept. of Neurosurgery

## 2022-12-15 NOTE — Telephone Encounter (Signed)
Medication Refill - Medication: HYDROcodone-acetaminophen (NORCO) 5-325 MG tablet   Has the patient contacted their pharmacy? Yes.   Pharmacy said they do not take his insurance so they need the script sent to [facility]  Preferred Pharmacy (with phone number or street name):  [id number] [place], [place] - [address] AT [facility] OF [address] [facility] Phone: [phone]  Fax: [phone]     Has the patient been seen for an appointment in the last year OR does the patient have an upcoming appointment? No.  Agent: Please be advised that RX refills may take up to 3 business days. We ask that you follow-up with your pharmacy.

## 2022-12-15 NOTE — Telephone Encounter (Signed)
Medication Refill - Medication: HYDROcodone-acetaminophen (NORCO) 5-325 MG tablet    Has the patient contacted their pharmacy? Yes.   Pharmacy said they do not take his insurance so they need the script sent to [facility] (with phone number or street name):  [id number] - [place], [place] - [address] AT [facility] OF [address] [place]        Med is not on pt's current med profile. Please advise.

## 2022-12-16 ENCOUNTER — Other Ambulatory Visit (HOSPITAL_COMMUNITY): Payer: Self-pay

## 2022-12-17 ENCOUNTER — Ambulatory Visit: Admitting: Oncology

## 2022-12-18 ENCOUNTER — Encounter: Payer: Self-pay | Admitting: Urology

## 2022-12-21 ENCOUNTER — Other Ambulatory Visit: Payer: Self-pay | Admitting: Oncology

## 2022-12-21 MED ORDER — OXYCODONE HCL 5 MG PO TABS: 5.0000 mg | ORAL_TABLET | ORAL | 0 refills | Status: DC | PRN

## 2022-12-21 NOTE — Telephone Encounter (Signed)
Multiple calls made to pt's ph, no answer. Will send mychart mssg

## 2022-12-21 NOTE — Telephone Encounter (Signed)
Please add palliative care (pain management) to appt on 7/17.

## 2022-12-22 ENCOUNTER — Ambulatory Visit (INDEPENDENT_AMBULATORY_CARE_PROVIDER_SITE_OTHER): Admitting: Orthopedic Surgery

## 2022-12-22 ENCOUNTER — Encounter: Payer: Self-pay | Admitting: Orthopedic Surgery

## 2022-12-22 VITALS — BP 115/72 | Ht 75.0 in | Wt 246.0 lb

## 2022-12-22 DIAGNOSIS — M47816 Spondylosis without myelopathy or radiculopathy, lumbar region: Secondary | ICD-10-CM

## 2022-12-22 DIAGNOSIS — M4726 Other spondylosis with radiculopathy, lumbar region: Secondary | ICD-10-CM | POA: Diagnosis not present

## 2022-12-22 DIAGNOSIS — M5136 Other intervertebral disc degeneration, lumbar region: Secondary | ICD-10-CM | POA: Diagnosis not present

## 2022-12-22 DIAGNOSIS — M5416 Radiculopathy, lumbar region: Secondary | ICD-10-CM

## 2022-12-23 ENCOUNTER — Inpatient Hospital Stay: Attending: Oncology | Admitting: Oncology

## 2022-12-23 ENCOUNTER — Encounter: Payer: Self-pay | Admitting: Oncology

## 2022-12-23 ENCOUNTER — Telehealth: Payer: Self-pay

## 2022-12-23 ENCOUNTER — Inpatient Hospital Stay (HOSPITAL_BASED_OUTPATIENT_CLINIC_OR_DEPARTMENT_OTHER): Admitting: Hospice and Palliative Medicine

## 2022-12-23 VITALS — BP 116/83 | HR 64 | Temp 97.5°F | Resp 18 | Wt 252.6 lb

## 2022-12-23 DIAGNOSIS — Z515 Encounter for palliative care: Secondary | ICD-10-CM

## 2022-12-23 DIAGNOSIS — G893 Neoplasm related pain (acute) (chronic): Secondary | ICD-10-CM | POA: Diagnosis not present

## 2022-12-23 DIAGNOSIS — R1900 Intra-abdominal and pelvic swelling, mass and lump, unspecified site: Secondary | ICD-10-CM

## 2022-12-23 DIAGNOSIS — R1909 Other intra-abdominal and pelvic swelling, mass and lump: Secondary | ICD-10-CM | POA: Diagnosis present

## 2022-12-23 DIAGNOSIS — N433 Hydrocele, unspecified: Secondary | ICD-10-CM | POA: Insufficient documentation

## 2022-12-23 DIAGNOSIS — M545 Low back pain, unspecified: Secondary | ICD-10-CM

## 2022-12-23 DIAGNOSIS — N1330 Unspecified hydronephrosis: Secondary | ICD-10-CM | POA: Insufficient documentation

## 2022-12-23 DIAGNOSIS — G8929 Other chronic pain: Secondary | ICD-10-CM | POA: Insufficient documentation

## 2022-12-23 NOTE — Assessment & Plan Note (Signed)
Previous labs were reviewed.  Negative flow cytometry, no M protein on SPEP PET scan results showed activity of the retroperitoneal mass.  I recommend CT-guided biopsy to establish tissue diagnosis.

## 2022-12-23 NOTE — Progress Notes (Signed)
Hematology/Oncology Consult Note [phone] [phone]     REFERRING PROVIDER: Jacky Kindle, FNP    CHIEF COMPLAINTS/PURPOSE OF CONSULTATION:  Retroperitoneal mass  ASSESSMENT & PLAN:   Retroperitoneal mass Previous labs were reviewed.  Negative flow cytometry, no M protein on SPEP PET scan results showed activity of the retroperitoneal mass.  I recommend CT-guided biopsy to establish tissue diagnosis.   Hydrocele in adult Refer to urology for evaluation.  Back pain Chronic back pain, radiating to right groin and right thigh. He has degenerative disease of lumbar spine. He has taken hydrocodone without significant improvement. The pain is more than I would expect from his 3.4 cm retroperitoneal mass. He will meet with palliative care service to adjust the pain.  Have given him a prescription of oxycodone 5 to 10 mg every 4 hours as needed to see if that will help his pain.  Have also discussed with neurosurgery Dr. Marcell Barlow.  He recommends chiropractic, massage, acupuncture, etc.  Steroid injection may not be ideal at this point due to his uncontrolled diabetes.    Orders Placed This Encounter  Procedures   CT BIOPSY    Standing Status:   Future    Standing Expiration Date:   12/23/2023    Order Specific Question:   Reason for exam:    Answer:   retroperitoneal mass    Order Specific Question:   Preferred imaging location?    Answer:   [facility]   Follow-up 1 week after biopsy.. All questions were answered. The patient knows to call the clinic with any problems, questions or concerns.  Rickard Patience, MD, PhD [facility] Hematology Oncology 12/23/2022    HISTORY OF PRESENTING ILLNESS:  [name] [name] 47 y.o. male presents to establish care for retroperitoneal mass  Patient reports that he has experienced lower back pain since January.  He has also had a 24 pound weight loss since January 2024.  Denies any night sweats, fever or chills.   Weight loss is partially intentional, due to increased exercise, diet.  Occasionally, he experienced nausea vomiting diarrhea episodes.  Approximately once per months.  He has also experienced testicle pressure/discomfort.  Patient reports that her primary care provider examined her testicles, physical examination was not remarkable.  10/17/2022 lumbar spine without contrast showed 1. 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 2. Central disc protrusion and left paramidline annular tear at L5-S1 without significant stenosis. 3. Mild facet hypertrophy at L5-S1.   11/25/2022 CT abdomen pelvis with contrast Mild right hydroureteronephrosis is noted without obstructing calculus. There appears to be occlusion or compression of the midportion of the right ureter secondary to 3.4 x 2.1 cm retroperitoneal mass at the level of the aortic bifurcation concerning for malignancy. This mass also appears to be causing significant narrowing of the right common iliac artery. Bilateral L5 spondylolysis. Aortic Atherosclerosis   INTERVAL HISTORY [name] [name] is a 47 y.o. male who has above history reviewed by me today presents for follow up visit for retroperitoneal mass.  He presented to discuss PET scan results. Continue to have intermittent lower back pain, radiated to his right groin and right thigh.  He has seen neurosurgery.  He has taken gabapentin which has not helped him.  He has tried physical therapy which did not help him.  He has tried 2 tablets of hydrocodone last night and he reports the pain medication did not help at  all.   MEDICAL HISTORY:  Past Medical History:  Diagnosis Date   ADHD (attention deficit hyperactivity disorder)    Allergy    Arthritis    CAD (coronary artery disease)    S/P cath May 2012 with BMS to the LAD following abnormal stress test   Diabetes mellitus    Drug  abuse (HCC)    history of marijuana use and alcohol   GERD (gastroesophageal reflux disease)    Hypercholesterolemia    Hypertension    Myocardial infarction (HCC)    Obesity    Oxygen deficiency    Sleep apnea     SURGICAL HISTORY: Past Surgical History:  Procedure Laterality Date   ABSCESS DRAINAGE     right arm; approx 2003   CARDIAC CATHETERIZATION     COLONOSCOPY WITH PROPOFOL N/A 10/10/2020   Procedure: COLONOSCOPY WITH PROPOFOL;  Surgeon: [name], MD;  Location: [facility] ENDOSCOPY;  Service: Gastroenterology;  Laterality: N/A;   CORONARY ANGIOPLASTY     CORONARY STENT PLACEMENT  May 2012   LAD    SOCIAL HISTORY: Social History   Socioeconomic History   Marital status: Married    Spouse name: Not on file   Number of children: 4   Years of education: Not on file   Highest education level: Not on file  Occupational History   Occupation: disabled  Tobacco Use   Smoking status: Former    Current packs/day: 0.00    Average packs/day: 1 pack/day for 14.0 years (14.0 ttl pk-yrs)    Types: Cigarettes    Start date: 06/2003    Quit date: 06/2017    Years since quitting: 5.5   Smokeless tobacco: Never  Vaping Use   Vaping status: Never Used  Substance and Sexual Activity   Alcohol use: Not Currently    Alcohol/week: 10.0 standard drinks of alcohol    Types: 2 Cans of beer, 8 Shots of liquor per week    Comment: quit approx 09/2022   Drug use: Yes    Frequency: 20.0 times per week    Types: Marijuana   Sexual activity: Yes  Other Topics Concern   Not on file  Social History Narrative   Not on file   Social Determinants of Health   Financial Resource Strain: Not on file  Food Insecurity: Food Insecurity Present (12/01/2022)   Hunger Vital Sign    Worried About Running Out of Food in the Last Year: Sometimes true    Ran Out of Food in the Last Year: Sometimes true  Transportation Needs: No Transportation Needs (12/01/2022)   PRAPARE - Scientist, research (physical sciences) (Medical): No    Lack of Transportation (Non-Medical): No  Physical Activity: Not on file  Stress: Not on file  Social Connections: Not on file  Intimate Partner Violence: Not At Risk (12/01/2022)   Humiliation, Afraid, Rape, and Kick questionnaire    Fear of Current or Ex-Partner: No    Emotionally Abused: No    Physically Abused: No    Sexually Abused: No    FAMILY HISTORY: Family History  Problem Relation Age of Onset   Hypertension Mother    Diabetes Mother    Breast cancer Mother    Multiple myeloma Mother    Hypertension Father    Diabetes Father    Heart disease Father        cabg 3; 1995; 60 stents   Hypertension Sister    Heart disease Paternal Uncle    Heart  disease Paternal Grandmother     ALLERGIES:  is allergic to trulicity [dulaglutide] and penicillins.  MEDICATIONS:  Current Outpatient Medications  Medication Sig Dispense Refill   Accu-Chek Softclix Lancets lancets Use to check blood sugar daily for type 2 diabetes. E11.9 100 each 4   amLODipine (NORVASC) 10 MG tablet TAKE 1 TABLET(10 MG) BY MOUTH DAILY 90 tablet 1   aspirin 81 MG tablet Take 81 mg by mouth daily.     blood glucose meter kit and supplies Dispense based on patient and insurance preference. Use daily. (FOR ICD-10 E10.9, E11.9). 1 each 0   Blood Glucose Monitoring Suppl DEVI 1 each by Does not apply route in the morning, at noon, and at bedtime. May substitute to any manufacturer covered by patient's insurance. 1 each 0   Continuous Glucose Sensor (FREESTYLE LIBRE 3 SENSOR) MISC Place 1 sensor on the skin every 14 days. Use to check glucose continuously 2 each 12   ezetimibe (ZETIA) 10 MG tablet Take 1 tablet (10 mg total) by mouth daily. 90 tablet 1   gabapentin (NEURONTIN) 600 MG tablet Take 1 tablet (600 mg total) by mouth 3 (three) times daily. 90 tablet 0   glipiZIDE (GLIPIZIDE XL) 5 MG 24 hr tablet Take 1 tablet (5 mg total) by mouth 2 (two) times daily with a meal. 60 tablet  1   glucose blood (ONETOUCH VERIO) test strip Use as instructed 100 each 12   Lancet Device MISC 1 each by Does not apply route 2 (two) times daily as needed. May substitute to any manufacturer covered by patient's insurance. 180 each 3   losartan (COZAAR) 25 MG tablet Take 1 tablet (25 mg total) by mouth daily. 90 tablet 1   metoprolol succinate (TOPROL-XL) 25 MG 24 hr tablet Take 1 tablet (25 mg total) by mouth daily. 90 tablet 1   oxyCODONE (OXY IR/ROXICODONE) 5 MG immediate release tablet Take 1-2 tablets (5-10 mg total) by mouth every 4 (four) hours as needed for severe pain. 60 tablet 0   pantoprazole (PROTONIX) 40 MG tablet TAKE 1 TABLET(40 MG) BY MOUTH DAILY 90 tablet 0   QUEtiapine (SEROQUEL) 50 MG tablet Take 50 mg by mouth at bedtime.     rosuvastatin (CRESTOR) 40 MG tablet TAKE 1 TABLET(40 MG) BY MOUTH DAILY 90 tablet 1   sitaGLIPtin (JANUVIA) 100 MG tablet Take 1 tablet (100 mg total) by mouth daily. 90 tablet 0   tamsulosin (FLOMAX) 0.4 MG CAPS capsule TAKE 2 CAPSULES(0.8 MG) BY MOUTH DAILY 180 capsule 3   insulin glargine (LANTUS SOLOSTAR) 100 UNIT/ML Solostar Pen 16 units nightly; check fasting blood sugar daily. Titrate dose every 3 days by 2 units to meet fasting blood sugar of 110-120. (Patient not taking: Reported on 12/23/2022) 15 mL 11   Insulin Pen Needle (PEN NEEDLES 5/16") 30G X 8 MM MISC 100 each by Does not apply route at bedtime. (Patient not taking: Reported on 12/23/2022) 100 each 3   methocarbamol (ROBAXIN) 750 MG tablet 1/2 TO 1 TABLET BY MOUTH TWICE DAILY AS NEEDED (Patient not taking: Reported on 12/23/2022)     nitroGLYCERIN (NITROSTAT) 0.4 MG SL tablet Place 1 tablet (0.4 mg total) under the tongue every 5 (five) minutes as needed for chest pain. Call 9-1-1 if you take 3 tablets. (Patient not taking: Reported on 12/23/2022) 25 tablet 4   No current facility-administered medications for this visit.    Review of Systems  Constitutional:  Negative for chills, fatigue  and fever.  HENT:   Negative for hearing loss and voice change.   Eyes:  Negative for eye problems and icterus.  Respiratory:  Negative for chest tightness, cough and shortness of breath.   Cardiovascular:  Negative for chest pain and leg swelling.  Gastrointestinal:  Negative for abdominal distention and abdominal pain.  Endocrine: Negative for hot flashes.  Genitourinary:  Negative for difficulty urinating, dysuria and frequency.        Testicle discomfort/pressure.  Musculoskeletal:  Positive for back pain. Negative for arthralgias.  Skin:  Negative for itching and rash.  Neurological:  Negative for light-headedness and numbness.  Hematological:  Negative for adenopathy. Does not bruise/bleed easily.  Psychiatric/Behavioral:  Negative for confusion.      PHYSICAL EXAMINATION: ECOG PERFORMANCE STATUS: 1 - Symptomatic but completely ambulatory  Vitals:   12/23/22 1107  BP: 116/83  Pulse: 64  Resp: 18  Temp: (!) 97.5 F (36.4 C)   Filed Weights   12/23/22 1107  Weight: 252 lb 9.6 oz (114.6 kg)    Physical Exam Constitutional:      General: He is not in acute distress.    Appearance: He is not diaphoretic.  HENT:     Head: Normocephalic and atraumatic.     Nose: Nose normal.     Mouth/Throat:     Pharynx: No oropharyngeal exudate.  Eyes:     General: No scleral icterus.    Pupils: Pupils are equal, round, and reactive to light.  Cardiovascular:     Rate and Rhythm: Normal rate and regular rhythm.     Heart sounds: No murmur heard. Pulmonary:     Effort: Pulmonary effort is normal. No respiratory distress.     Breath sounds: No rales.  Chest:     Chest wall: No tenderness.  Abdominal:     General: There is no distension.     Palpations: Abdomen is soft.     Tenderness: There is no abdominal tenderness.  Musculoskeletal:        General: Normal range of motion.     Cervical back: Normal range of motion and neck supple.  Skin:    General: Skin is warm and dry.      Findings: No erythema.  Neurological:     Mental Status: He is alert and oriented to person, place, and time.     Cranial Nerves: No cranial nerve deficit.     Motor: No abnormal muscle tone.     Coordination: Coordination normal.  Psychiatric:        Mood and Affect: Affect normal.      LABORATORY DATA:  I have reviewed the data as listed    Latest Ref Rng & Units 12/01/2022   10:03 AM 11/25/2022    6:31 PM 09/24/2022    1:49 PM  CBC  WBC 4.0 - 10.5 K/uL 5.4  6.9  6.3   Hemoglobin 13.0 - 17.0 g/dL 19.1  47.8  29.5   Hematocrit 39.0 - 52.0 % 39.8  44.3  41.5   Platelets 150 - 400 K/uL 253  267  232       Latest Ref Rng & Units 11/25/2022    6:31 PM 09/24/2022    1:49 PM 02/04/2022   11:43 AM  CMP  Glucose 70 - 99 mg/dL 621  308  657   BUN 6 - 20 mg/dL 14  8  9    Creatinine 0.61 - 1.24 mg/dL 8.46  9.62  9.52   Sodium 135 - 145 mmol/L  140  138  141   Potassium 3.5 - 5.1 mmol/L 3.4  4.4  4.7   Chloride 98 - 111 mmol/L 109  101  104   CO2 22 - 32 mmol/L 16  22  20    Calcium 8.9 - 10.3 mg/dL 9.5  40.9  9.5   Total Protein 6.5 - 8.1 g/dL 8.7  6.9  7.0   Total Bilirubin 0.3 - 1.2 mg/dL 0.9  0.3  0.3   Alkaline Phos 38 - 126 U/L 52  81  72   AST 15 - 41 U/L 20  19  34   ALT 0 - 44 U/L 20  27  55      RADIOGRAPHIC STUDIES: I have personally reviewed the radiological images as listed and agreed with the findings in the report. NM PET Image Initial (PI) Skull Base To Thigh  Result Date: 12/21/2022 CLINICAL DATA:  Initial treatment strategy for right retroperitoneal mass. EXAM: NUCLEAR MEDICINE PET SKULL BASE TO THIGH TECHNIQUE: 12.5 mCi F-18 FDG was injected intravenously. Full-ring PET imaging was performed from the skull base to thigh after the radiotracer. CT data was obtained and used for attenuation correction and anatomic localization. Fasting blood glucose: 104 mg/dl COMPARISON:  CT abdomen/pelvis dated 11/25/2022 and 07/25/2020. FINDINGS: Mediastinal blood pool activity:  SUV max 2.7 Liver activity: SUV max NA NECK: No hypermetabolic cervical lymphadenopathy. Incidental CT findings: None. CHEST: No hypermetabolic thoracic lymphadenopathy. No metabolic pulmonary nodules. Incidental CT findings: Ectasia of the ascending thoracic aorta, measuring 3.9 cm. Moderate coronary atherosclerosis of the LAD and left circumflex. ABDOMEN/PELVIS: 3.4 x 1.8 cm soft tissue lesion along the aortic bifurcation and extending along the right common iliac artery (series 4/image 124), max SUV 7.2. This appearance favors lymphoma over retroperitoneal fibrosis. No abnormal metabolism in the liver, pancreas, or adrenal glands. Spleen is normal in size, without focal hypermetabolism. Incidental CT findings: Mild to moderate right hydroureteronephrosis, on the basis of extrinsic compression by the retroperitoneal mass. Atherosclerotic calcifications abdominal aorta and branch vessels. SKELETON: 15 mm sclerotic lesion in the left iliac bone (series 4/image 138), unchanged from multiple priors, and therefore favoring a benign etiology. Mild hypermetabolism, max SUV 4.2. Incidental CT findings: None. IMPRESSION: 3.4 cm hypermetabolic soft tissue lesion along the aortic bifurcation and extending along the right common iliac artery, favoring lymphoma over retroperitoneal fibrosis. Mild to moderate right hydroureteronephrosis, on the basis of extrinsic compression by the retroperitoneal mass. 15 mm sclerotic lesion in the left iliac bone, unchanged from multiple priors, and therefore favoring a benign etiology despite mild hypermetabolism. Electronically Signed   By: Charline Bills M.D.   On: 12/21/2022 04:10   US SCROTUM W/DOPPLER  Result Date: 12/08/2022 CLINICAL DATA:  Testicular pain and pressure, right-greater-than-left. EXAM: SCROTAL ULTRASOUND DOPPLER ULTRASOUND OF THE TESTICLES TECHNIQUE: Complete ultrasound examination of the testicles, epididymis, and other scrotal structures was performed. Color and  spectral Doppler ultrasound were also utilized to evaluate blood flow to the testicles. COMPARISON:  CT abdomen and pelvis dated 11/25/2022 FINDINGS: Right testicle Measurements: 4.9 x 2.1 x 3.0 cm. No mass or microlithiasis visualized. Left testicle Measurements: 4.7 x 2.4 x 3.3 cm. No mass or microlithiasis visualized. Right epididymis:  Normal in size and appearance. Left epididymis:  Normal in size and appearance. Hydrocele: There is a large right hydrocele and a moderate left hydrocele. A 4 mm cyst along the scrotal wall on the right likely represents a tunica cyst. A 4 mm appendage along the left scrotal wall is  favored to reflect a complicated tunica cyst. Varicocele:  None visualized. Pulsed Doppler interrogation of both testes demonstrates normal low resistance arterial and venous waveforms bilaterally. IMPRESSION: Large right hydrocele and moderate left hydrocele. No testicular mass. Electronically Signed   By: Romona Curls M.D.   On: 12/08/2022 14:33   CT ABDOMEN PELVIS W CONTRAST  Result Date: 11/25/2022 CLINICAL DATA:  Acute lower abdominal pain. EXAM: CT ABDOMEN AND PELVIS WITH CONTRAST TECHNIQUE: Multidetector CT imaging of the abdomen and pelvis was performed using the standard protocol following bolus administration of intravenous contrast. RADIATION DOSE REDUCTION: This exam was performed according to the departmental dose-optimization program which includes automated exposure control, adjustment of the mA and/or kV according to patient size and/or use of iterative reconstruction technique. CONTRAST:  OMNIPAQUE IOHEXOL 300 MG/ML  SOLN COMPARISON:  July 25, 2020. FINDINGS: Lower chest: No acute abnormality. Hepatobiliary: No focal liver abnormality is seen. No gallstones, gallbladder wall thickening, or biliary dilatation. Pancreas: Unremarkable. No pancreatic ductal dilatation or surrounding inflammatory changes. Spleen: Normal in size without focal abnormality. Adrenals/Urinary  Tract: Adrenal glands appear normal. Left kidney and ureter are unremarkable. Mild right hydronephrosis and proximal ureteral dilatation is noted without ureteral calculus. There appears to be occlusion of the midportion of the right ureter secondary to 3.4 x 2.1 cm retroperitoneal mass at the level of the aortic bifurcation concerning for malignancy. Urinary bladder is decompressed. Stomach/Bowel: Stomach is within normal limits. Appendix appears normal. No evidence of bowel wall thickening, distention, or inflammatory changes. Vascular/Lymphatic: Aortic atherosclerosis. Retroperitoneal mass around aortic bifurcation as described above. This does appear to be resulting in narrowing of the right common iliac artery. Reproductive: Prostate is unremarkable. Other: No abdominal wall hernia or abnormality. No abdominopelvic ascites. Musculoskeletal: Bilateral L5 spondylolysis. No acute osseous abnormality is noted. IMPRESSION: Mild right hydroureteronephrosis is noted without obstructing calculus. There appears to be occlusion or compression of the midportion of the right ureter secondary to 3.4 x 2.1 cm retroperitoneal mass at the level of the aortic bifurcation concerning for malignancy. This mass also appears to be causing significant narrowing of the right common iliac artery. Bilateral L5 spondylolysis. Aortic Atherosclerosis (ICD10-I70.0). Electronically Signed   By: Lupita Raider M.D.   On: 11/25/2022 20:03

## 2022-12-23 NOTE — Telephone Encounter (Signed)
Per LOS 12/23/22:   CT guided biopsy - retroperitoneal mass.  MD 1 week after biopsy   Request for biopsy faxed to IR

## 2022-12-23 NOTE — Assessment & Plan Note (Signed)
Refer to urology for evaluation
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
## 2022-12-23 NOTE — Progress Notes (Signed)
Pt here for follow up. Reports has been stumbling a lot due to leg weakness.

## 2022-12-23 NOTE — Assessment & Plan Note (Addendum)
Chronic back pain, radiating to right groin and right thigh. He has degenerative disease of lumbar spine. He has taken hydrocodone without significant improvement. The pain is more than I would expect from his 3.4 cm retroperitoneal mass. He will meet with palliative care service to adjust the pain.  Have given him a prescription of oxycodone 5 to 10 mg every 4 hours as needed to see if that will help his pain.  Have also discussed with neurosurgery Dr. Marcell Barlow.  He recommends chiropractic, massage, acupuncture, etc.  Steroid injection may not be ideal at this point due to his uncontrolled diabetes.

## 2022-12-25 ENCOUNTER — Telehealth: Payer: Self-pay

## 2022-12-25 DIAGNOSIS — R1900 Intra-abdominal and pelvic swelling, mass and lump, unspecified site: Secondary | ICD-10-CM

## 2022-12-25 NOTE — Telephone Encounter (Signed)
-----   Message from Rickard Patience sent at 12/25/2022  3:43 PM EDT ----- Regarding: RE: CT Biopsy Thank you for the update. ----- Message ----- From: Markus Daft Sent: 12/25/2022  10:13 AM EDT To: Coralee Rud, RN; Rickard Patience, MD Subject: Annell Greening: CT Biopsy                                   ----- Message ----- From: Pernell Dupre, MD Sent: 12/25/2022   8:18 AM EDT To: Markus Daft Subject: RE: CT Biopsy                                  Not accessible/too closely associated with the aortoiliac bifurcation for safe perc biopsy. Rec surgical consultation.   [name] [name] ----- Message ----- From: Markus Daft Sent: 12/24/2022   9:16 AM EDT To: Ir Procedure Requests Subject: CT Biopsy                                      IR Approval Request:   Procedure:    CT guided retroperitoneal biopsy  Reason:        retroperitoneal mass  / lymphoma versus retroperitoneal fibrosis  History:         PET Scan    12/15/2022  Provider:      Dr Rickard Patience, MD    Provider Contact #     [facility]   [phone]

## 2022-12-25 NOTE — Telephone Encounter (Signed)
Referral placed to [facility]

## 2022-12-28 NOTE — Telephone Encounter (Signed)
Received message from IR stating that site was not feasable. Referral was sent to surgery for further eval.

## 2022-12-31 NOTE — Progress Notes (Signed)
[name] Dupre, MD sent to [name] [name] Not accessible/too closely associated with the aortoiliac bifurcation for safe perc biopsy. Rec surgical consultation.  [name] [name]       Previous Messages    ----- Message ----- From: [name] [name] Sent: 12/24/2022   9:16 AM EDT To: Ir Procedure Requests Subject: CT Biopsy                                      IR Approval Request:   Procedure:    CT guided retroperitoneal biopsy  Reason:        retroperitoneal mass  / lymphoma versus retroperitoneal fibrosis  History:         PET Scan    12/15/2022  Provider:      Dr Rickard Patience, MD    Provider Contact #     [facility]   [phone]

## 2023-01-04 ENCOUNTER — Ambulatory Visit: Admitting: Gastroenterology

## 2023-01-04 ENCOUNTER — Ambulatory Visit (INDEPENDENT_AMBULATORY_CARE_PROVIDER_SITE_OTHER): Admitting: Surgery

## 2023-01-04 ENCOUNTER — Encounter: Payer: Self-pay | Admitting: Surgery

## 2023-01-04 VITALS — BP 116/78 | HR 66 | Temp 98.2°F | Ht 75.0 in | Wt 255.0 lb

## 2023-01-04 DIAGNOSIS — R1909 Other intra-abdominal and pelvic swelling, mass and lump: Secondary | ICD-10-CM | POA: Diagnosis not present

## 2023-01-04 DIAGNOSIS — R1900 Intra-abdominal and pelvic swelling, mass and lump, unspecified site: Secondary | ICD-10-CM

## 2023-01-04 NOTE — Progress Notes (Deleted)
[name] MD, MRCP(U.K) [address]  [address]  [place], [place] [address]  Main: [phone]  Fax: [phone]   Primary Care Physician: Jacky Kindle, FNP  Primary Gastroenterologist:  Dr. [name]   No chief complaint on file.   HPI: [name] [name] is a 47 y.o. male   Summary of history :    Interval history  09/25/2020-01/04/2023   Today to see me after 2 years for abdominal pain and rectal bleeding.  10/10/2020: Non bleeding internal hemorrhoids , 2 diminutive polyps resected. Sessile serrated polyps.   Being worked up by Oncology for a retroperitoneal mass.   PET scan 12/15/2022:  3.4 cm hypermetabolic soft tissue lesion along the aortic bifurcation and extending along the right common iliac artery, favoring lymphoma over retroperitoneal fibrosis.   Mild to moderate right hydroureteronephrosis, on the basis of extrinsic compression by the retroperitoneal mass.   15 mm sclerotic lesion in the left iliac bone, unchanged from multiple priors, and therefore favoring a benign etiology despite mild hypermetabolism.  11/25/2022: CBC and CMP- grossly normal except elevated glucose.    Current Outpatient Medications  Medication Sig Dispense Refill   Accu-Chek Softclix Lancets lancets Use to check blood sugar daily for type 2 diabetes. E11.9 100 each 4   amLODipine (NORVASC) 10 MG tablet TAKE 1 TABLET(10 MG) BY MOUTH DAILY 90 tablet 1   aspirin 81 MG tablet Take 81 mg by mouth daily.     blood glucose meter kit and supplies Dispense based on patient and insurance preference. Use daily. (FOR ICD-10 E10.9, E11.9). 1 each 0   Blood Glucose Monitoring Suppl DEVI 1 each by Does not apply route in the morning, at noon, and at bedtime. May substitute to any manufacturer covered by patient's insurance. 1 each 0   Continuous Glucose Sensor (FREESTYLE LIBRE 3 SENSOR) MISC Place 1 sensor on the skin every 14 days. Use to check glucose continuously 2 each 12   ezetimibe  (ZETIA) 10 MG tablet Take 1 tablet (10 mg total) by mouth daily. 90 tablet 1   gabapentin (NEURONTIN) 600 MG tablet Take 1 tablet (600 mg total) by mouth 3 (three) times daily. 90 tablet 0   glipiZIDE (GLIPIZIDE XL) 5 MG 24 hr tablet Take 1 tablet (5 mg total) by mouth 2 (two) times daily with a meal. 60 tablet 1   glucose blood (ONETOUCH VERIO) test strip Use as instructed 100 each 12   insulin glargine (LANTUS SOLOSTAR) 100 UNIT/ML Solostar Pen 16 units nightly; check fasting blood sugar daily. Titrate dose every 3 days by 2 units to meet fasting blood sugar of 110-120. 15 mL 11   Insulin Pen Needle (PEN NEEDLES 5/16") 30G X 8 MM MISC 100 each by Does not apply route at bedtime. 100 each 3   losartan (COZAAR) 25 MG tablet Take 1 tablet (25 mg total) by mouth daily. 90 tablet 1   methocarbamol (ROBAXIN) 750 MG tablet 1/2 TO 1 TABLET BY MOUTH TWICE DAILY AS NEEDED     metoprolol succinate (TOPROL-XL) 25 MG 24 hr tablet Take 1 tablet (25 mg total) by mouth daily. 90 tablet 1   nitroGLYCERIN (NITROSTAT) 0.4 MG SL tablet Place 1 tablet (0.4 mg total) under the tongue every 5 (five) minutes as needed for chest pain. Call 9-1-1 if you take 3 tablets. 25 tablet 4   oxyCODONE (OXY IR/ROXICODONE) 5 MG immediate release tablet Take 1-2 tablets (5-10 mg total) by mouth every 4 (four) hours as  needed for severe pain. 60 tablet 0   pantoprazole (PROTONIX) 40 MG tablet TAKE 1 TABLET(40 MG) BY MOUTH DAILY 90 tablet 0   QUEtiapine (SEROQUEL) 50 MG tablet Take 50 mg by mouth at bedtime.     rosuvastatin (CRESTOR) 40 MG tablet TAKE 1 TABLET(40 MG) BY MOUTH DAILY 90 tablet 1   sitaGLIPtin (JANUVIA) 100 MG tablet Take 1 tablet (100 mg total) by mouth daily. 90 tablet 0   tamsulosin (FLOMAX) 0.4 MG CAPS capsule TAKE 2 CAPSULES(0.8 MG) BY MOUTH DAILY 180 capsule 3   No current facility-administered medications for this visit.    Allergies as of 01/04/2023 - Review Complete 01/04/2023  Allergen Reaction Noted    Penicillins Hives 10/01/2010   Trulicity [dulaglutide] Diarrhea and Nausea And Vomiting 10/14/2020       Interval history   ***/***/202*   ***/***/2024   ROS:  General: Negative for anorexia, weight loss, fever, chills, fatigue, weakness. ENT: Negative for hoarseness, difficulty swallowing , nasal congestion. CV: Negative for chest pain, angina, palpitations, dyspnea on exertion, peripheral edema.  Respiratory: Negative for dyspnea at rest, dyspnea on exertion, cough, sputum, wheezing.  GI: See history of present illness. GU:  Negative for dysuria, hematuria, urinary incontinence, urinary frequency, nocturnal urination.  Endo: Negative for unusual weight change.    Physical Examination:   There were no vitals taken for this visit.  General: Well-nourished, well-developed in no acute distress.  Eyes: No icterus. Conjunctivae pink. Mouth: Oropharyngeal mucosa moist and pink , no lesions erythema or exudate. Lungs: Clear to auscultation bilaterally. Non-labored. Heart: Regular rate and rhythm, no murmurs rubs or gallops.  Abdomen: Bowel sounds are normal, nontender, nondistended, no hepatosplenomegaly or masses, no abdominal bruits or hernia , no rebound or guarding.   Extremities: No lower extremity edema. No clubbing or deformities. Neuro: Alert and oriented x 3.  Grossly intact. Skin: Warm and dry, no jaundice.   Psych: Alert and cooperative, normal mood and affect.   Imaging Studies: NM PET Image Initial (PI) Skull Base To Thigh  Result Date: 12/21/2022 CLINICAL DATA:  Initial treatment strategy for right retroperitoneal mass. EXAM: NUCLEAR MEDICINE PET SKULL BASE TO THIGH TECHNIQUE: 12.5 mCi F-18 FDG was injected intravenously. Full-ring PET imaging was performed from the skull base to thigh after the radiotracer. CT data was obtained and used for attenuation correction and anatomic localization. Fasting blood glucose: 104 mg/dl COMPARISON:  CT abdomen/pelvis dated 11/25/2022  and 07/25/2020. FINDINGS: Mediastinal blood pool activity: SUV max 2.7 Liver activity: SUV max NA NECK: No hypermetabolic cervical lymphadenopathy. Incidental CT findings: None. CHEST: No hypermetabolic thoracic lymphadenopathy. No metabolic pulmonary nodules. Incidental CT findings: Ectasia of the ascending thoracic aorta, measuring 3.9 cm. Moderate coronary atherosclerosis of the LAD and left circumflex. ABDOMEN/PELVIS: 3.4 x 1.8 cm soft tissue lesion along the aortic bifurcation and extending along the right common iliac artery (series 4/image 124), max SUV 7.2. This appearance favors lymphoma over retroperitoneal fibrosis. No abnormal metabolism in the liver, pancreas, or adrenal glands. Spleen is normal in size, without focal hypermetabolism. Incidental CT findings: Mild to moderate right hydroureteronephrosis, on the basis of extrinsic compression by the retroperitoneal mass. Atherosclerotic calcifications abdominal aorta and branch vessels. SKELETON: 15 mm sclerotic lesion in the left iliac bone (series 4/image 138), unchanged from multiple priors, and therefore favoring a benign etiology. Mild hypermetabolism, max SUV 4.2. Incidental CT findings: None. IMPRESSION: 3.4 cm hypermetabolic soft tissue lesion along the aortic bifurcation and extending along the right common iliac artery, favoring  lymphoma over retroperitoneal fibrosis. Mild to moderate right hydroureteronephrosis, on the basis of extrinsic compression by the retroperitoneal mass. 15 mm sclerotic lesion in the left iliac bone, unchanged from multiple priors, and therefore favoring a benign etiology despite mild hypermetabolism. Electronically Signed   By: Charline Bills M.D.   On: 12/21/2022 04:10   US SCROTUM W/DOPPLER  Result Date: 12/08/2022 CLINICAL DATA:  Testicular pain and pressure, right-greater-than-left. EXAM: SCROTAL ULTRASOUND DOPPLER ULTRASOUND OF THE TESTICLES TECHNIQUE: Complete ultrasound examination of the testicles,  epididymis, and other scrotal structures was performed. Color and spectral Doppler ultrasound were also utilized to evaluate blood flow to the testicles. COMPARISON:  CT abdomen and pelvis dated 11/25/2022 FINDINGS: Right testicle Measurements: 4.9 x 2.1 x 3.0 cm. No mass or microlithiasis visualized. Left testicle Measurements: 4.7 x 2.4 x 3.3 cm. No mass or microlithiasis visualized. Right epididymis:  Normal in size and appearance. Left epididymis:  Normal in size and appearance. Hydrocele: There is a large right hydrocele and a moderate left hydrocele. A 4 mm cyst along the scrotal wall on the right likely represents a tunica cyst. A 4 mm appendage along the left scrotal wall is favored to reflect a complicated tunica cyst. Varicocele:  None visualized. Pulsed Doppler interrogation of both testes demonstrates normal low resistance arterial and venous waveforms bilaterally. IMPRESSION: Large right hydrocele and moderate left hydrocele. No testicular mass. Electronically Signed   By: Romona Curls M.D.   On: 12/08/2022 14:33    Assessment and Plan:   [name] [name] is a 48 y.o. y/o male ***    Dr [name]  MD,MRCP [facility]) Follow up in ***  BP check ***

## 2023-01-04 NOTE — Patient Instructions (Addendum)
We spoke with you about surgery and different approaches involved. We will have you come back here in 2 weeks to discuss you decision.

## 2023-01-05 ENCOUNTER — Encounter: Payer: Self-pay | Admitting: Surgery

## 2023-01-05 NOTE — Progress Notes (Signed)
Patient ID: [name] [name], male   DOB: 04/01/1976, 47 y.o.   MRN: [id number]  HPI [name] [name] is a 47 y.o. male seen in consultation at the request of Dr. Cathie Hoops. Recently had significant abdominal pain that seems to be in the lower abdomen, intermittent , moderate and sharp and radiated to bilateral groins.  Had some diarrhea and workup was initiated in the emergency room and a CT scan showed a retroperitoneal mass.  This has been seen by oncology and further workup has been done Previous labs were reviewed.  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  There Is also evidence of right hydroureter and there might be compression of the retroperitoneal mass into the right iliac artery. He Continues to have intermittent lower back pain, radiated to his right groin and right thigh. He has seen neurosurgery. He has taken gabapentin which has not helped him. He has tried physical therapy which did not help him.  Did have a recent normal CBC.  He has uncontrolled diabetes. Is able to perform more than 4 METS of activity without any shortness of breath or chest pain.  Does have a history of coronary artery disease.  HPI  Past Medical History:  Diagnosis Date   ADHD (attention deficit hyperactivity disorder)    Allergy    Arthritis    CAD (coronary artery disease)    S/P cath May 2012 with BMS to the LAD following abnormal stress test   Diabetes mellitus    Drug abuse (HCC)    history of marijuana use and alcohol   GERD (gastroesophageal reflux disease)    Hypercholesterolemia    Hypertension    Myocardial infarction (HCC)    Obesity    Oxygen deficiency    Sleep apnea     Past Surgical History:  Procedure Laterality Date   ABSCESS DRAINAGE     right arm; approx 2003   CARDIAC CATHETERIZATION     COLONOSCOPY WITH PROPOFOL N/A 10/10/2020   Procedure: COLONOSCOPY WITH PROPOFOL;  Surgeon: [name],  MD;  Location: [facility] ENDOSCOPY;  Service: Gastroenterology;  Laterality: N/A;   CORONARY ANGIOPLASTY     CORONARY STENT PLACEMENT  May 2012   LAD    Family History  Problem Relation Age of Onset   Hypertension Mother    Diabetes Mother    Breast cancer Mother    Multiple myeloma Mother    Hypertension Father    Diabetes Father    Heart disease Father        cabg 3; 25; 30 stents   Hypertension Sister    Heart disease Paternal Uncle    Heart disease Paternal Grandmother     Social History Social History   Tobacco Use   Smoking status: Former    Current packs/day: 0.00    Average packs/day: 1 pack/day for 14.0 years (14.0 ttl pk-yrs)    Types: Cigarettes    Start date: 06/2003    Quit date: 06/2017    Years since quitting: 5.5    Passive exposure: Past   Smokeless tobacco: Never  Vaping Use   Vaping status: Never Used  Substance Use Topics   Alcohol use: Not Currently    Alcohol/week: 10.0 standard drinks of alcohol    Types: 2 Cans of beer, 8 Shots of liquor per week    Comment: quit approx 09/2022   Drug use: Yes    Frequency: 20.0 times per week  Types: Marijuana    Allergies  Allergen Reactions   Penicillins Hives   Trulicity [Dulaglutide] Diarrhea and Nausea And Vomiting    Current Outpatient Medications  Medication Sig Dispense Refill   Accu-Chek Softclix Lancets lancets Use to check blood sugar daily for type 2 diabetes. E11.9 100 each 4   amLODipine (NORVASC) 10 MG tablet TAKE 1 TABLET(10 MG) BY MOUTH DAILY 90 tablet 1   aspirin 81 MG tablet Take 81 mg by mouth daily.     blood glucose meter kit and supplies Dispense based on patient and insurance preference. Use daily. (FOR ICD-10 E10.9, E11.9). 1 each 0   Blood Glucose Monitoring Suppl DEVI 1 each by Does not apply route in the morning, at noon, and at bedtime. May substitute to any manufacturer covered by patient's insurance. 1 each 0   Continuous Glucose Sensor (FREESTYLE LIBRE 3 SENSOR) MISC  Place 1 sensor on the skin every 14 days. Use to check glucose continuously 2 each 12   ezetimibe (ZETIA) 10 MG tablet Take 1 tablet (10 mg total) by mouth daily. 90 tablet 1   gabapentin (NEURONTIN) 600 MG tablet Take 1 tablet (600 mg total) by mouth 3 (three) times daily. 90 tablet 0   glipiZIDE (GLIPIZIDE XL) 5 MG 24 hr tablet Take 1 tablet (5 mg total) by mouth 2 (two) times daily with a meal. 60 tablet 1   glucose blood (ONETOUCH VERIO) test strip Use as instructed 100 each 12   insulin glargine (LANTUS SOLOSTAR) 100 UNIT/ML Solostar Pen 16 units nightly; check fasting blood sugar daily. Titrate dose every 3 days by 2 units to meet fasting blood sugar of 110-120. 15 mL 11   Insulin Pen Needle (PEN NEEDLES 5/16") 30G X 8 MM MISC 100 each by Does not apply route at bedtime. 100 each 3   losartan (COZAAR) 25 MG tablet Take 1 tablet (25 mg total) by mouth daily. 90 tablet 1   methocarbamol (ROBAXIN) 750 MG tablet 1/2 TO 1 TABLET BY MOUTH TWICE DAILY AS NEEDED     metoprolol succinate (TOPROL-XL) 25 MG 24 hr tablet Take 1 tablet (25 mg total) by mouth daily. 90 tablet 1   nitroGLYCERIN (NITROSTAT) 0.4 MG SL tablet Place 1 tablet (0.4 mg total) under the tongue every 5 (five) minutes as needed for chest pain. Call 9-1-1 if you take 3 tablets. 25 tablet 4   oxyCODONE (OXY IR/ROXICODONE) 5 MG immediate release tablet Take 1-2 tablets (5-10 mg total) by mouth every 4 (four) hours as needed for severe pain. 60 tablet 0   pantoprazole (PROTONIX) 40 MG tablet TAKE 1 TABLET(40 MG) BY MOUTH DAILY 90 tablet 0   QUEtiapine (SEROQUEL) 50 MG tablet Take 50 mg by mouth at bedtime.     rosuvastatin (CRESTOR) 40 MG tablet TAKE 1 TABLET(40 MG) BY MOUTH DAILY 90 tablet 1   sitaGLIPtin (JANUVIA) 100 MG tablet Take 1 tablet (100 mg total) by mouth daily. 90 tablet 0   tamsulosin (FLOMAX) 0.4 MG CAPS capsule TAKE 2 CAPSULES(0.8 MG) BY MOUTH DAILY 180 capsule 3   No current facility-administered medications for this  visit.     Review of Systems Full ROS  was asked and was negative except for the information on the HPI  Physical Exam Blood pressure 116/78, pulse 66, temperature 98.2 F (36.8 C), height 6\' 3"  (1.905 m), weight 255 lb (115.7 kg), SpO2 99%. CONSTITUTIONAL: NAD. EYES: Pupils are equal, round, Sclera are non-icteric. EARS, NOSE, MOUTH AND THROAT: The  oropharynx is clear. The oral mucosa is pink and moist. Hearing is intact to voice. LYMPH NODES:  Lymph nodes in the neck are normal. RESPIRATORY:  Lungs are clear. There is normal respiratory effort, with equal breath sounds bilaterally, and without pathologic use of accessory muscles. CARDIOVASCULAR: Heart is regular without murmurs, gallops, or rubs. GI: The abdomen is  soft, mild tenderness suprapubic area and bilateral groins w/o peritonitis, masses or hernias. There are no palpable masses. There is no hepatosplenomegaly. There are normal bowel sounds GU: Rectal deferred.   MUSCULOSKELETAL: Normal muscle strength and tone. No cyanosis or edema.   SKIN: Turgor is good and there are no pathologic skin lesions or ulcers. NEUROLOGIC: Motor and sensation is grossly normal. Cranial nerves are grossly intact. PSYCH:  Oriented to person, place and time. Affect is normal.  Data Reviewed  I have personally reviewed the patient's imaging, laboratory findings and medical records.    Assessment/Plan 47 year old male with retroperitoneal mass in close proximity to the right ureter right iliac and distal descending abdominal aorta.  There is concern for potential malignancy versus lymphoma and neck step will be a biopsy.  Unable to do percutaneous biopsy due to vascular structure next to it.  I do think that from our surgical standpoint we will be able to do a robotic assisted excision also with guidance and cannulization of the right Ureter by GU using ICG. An extensive discussion with him and his wife regarding this procedure.  I was very honest with  him and told him that this is not likely causing some of his lower abdominal pain and this will be fired diagnostic purposes.  Procedure discussed with him in detail.  Risk, benefits and possible complications including but not limited to: Bleeding, infection injury to bowel or ureter or other vascular structures were discussed in detail.  He understands.  I think that he wishes to think about what we talked today and come back in a couple weeks.  Please note that I spent 55 minutes in this encounter including personally reviewing multiple medical records, images studies,, coordinating his care, performing appropriate documentation .  A copy of this report was sent to the referring provider     Sterling Big, MD FACS General Surgeon 01/05/2023, 2:46 PM

## 2023-01-07 DIAGNOSIS — R1900 Intra-abdominal and pelvic swelling, mass and lump, unspecified site: Secondary | ICD-10-CM

## 2023-01-07 HISTORY — DX: Intra-abdominal and pelvic swelling, mass and lump, unspecified site: R19.00

## 2023-01-08 ENCOUNTER — Telehealth: Payer: Self-pay | Admitting: Surgery

## 2023-01-08 NOTE — Telephone Encounter (Signed)
I have called patient at both numbers available in his chart and left detailed message for him to call.  Per Dr. Everlene Farrier ok to overbook follow up in office either Monday or Wednesday (8/5 or 01/13/23) for follow up and discussion of surgery as patient has decided this is what he wants to do. Will also need to coordinate surgery with urology for cystoscopy and ICG injection.

## 2023-01-12 ENCOUNTER — Other Ambulatory Visit: Payer: Self-pay | Admitting: Family Medicine

## 2023-01-12 ENCOUNTER — Ambulatory Visit: Admitting: Family Medicine

## 2023-01-12 DIAGNOSIS — E1140 Type 2 diabetes mellitus with diabetic neuropathy, unspecified: Secondary | ICD-10-CM

## 2023-01-12 DIAGNOSIS — M545 Low back pain, unspecified: Secondary | ICD-10-CM

## 2023-01-12 DIAGNOSIS — K2100 Gastro-esophageal reflux disease with esophagitis, without bleeding: Secondary | ICD-10-CM

## 2023-01-12 DIAGNOSIS — M5137 Other intervertebral disc degeneration, lumbosacral region: Secondary | ICD-10-CM

## 2023-01-13 ENCOUNTER — Inpatient Hospital Stay: Attending: Oncology | Admitting: Hospice and Palliative Medicine

## 2023-01-13 ENCOUNTER — Ambulatory Visit: Admitting: Family Medicine

## 2023-01-13 ENCOUNTER — Other Ambulatory Visit: Payer: Self-pay

## 2023-01-13 VITALS — BP 114/83 | HR 70 | Temp 97.5°F | Ht 75.0 in | Wt 250.0 lb

## 2023-01-13 DIAGNOSIS — E1140 Type 2 diabetes mellitus with diabetic neuropathy, unspecified: Secondary | ICD-10-CM

## 2023-01-13 DIAGNOSIS — M459 Ankylosing spondylitis of unspecified sites in spine: Secondary | ICD-10-CM | POA: Diagnosis not present

## 2023-01-13 DIAGNOSIS — Z515 Encounter for palliative care: Secondary | ICD-10-CM

## 2023-01-13 DIAGNOSIS — G893 Neoplasm related pain (acute) (chronic): Secondary | ICD-10-CM

## 2023-01-13 DIAGNOSIS — R1900 Intra-abdominal and pelvic swelling, mass and lump, unspecified site: Secondary | ICD-10-CM

## 2023-01-13 DIAGNOSIS — R1909 Other intra-abdominal and pelvic swelling, mass and lump: Secondary | ICD-10-CM | POA: Diagnosis not present

## 2023-01-13 LAB — POCT GLYCOSYLATED HEMOGLOBIN (HGB A1C)
Est. average glucose Bld gHb Est-mCnc: 189
Hemoglobin A1C: 8.2 % — AB (ref 4.0–5.6)

## 2023-01-13 MED ORDER — GABAPENTIN 600 MG PO TABS: 600.0000 mg | ORAL_TABLET | Freq: Three times a day (TID) | ORAL | 0 refills | Status: DC

## 2023-01-13 MED ORDER — QUETIAPINE FUMARATE 50 MG PO TABS
50.0000 mg | ORAL_TABLET | Freq: Every day | ORAL | 0 refills | Status: DC
Start: 2023-01-13 — End: 2023-02-10

## 2023-01-13 MED ORDER — FREESTYLE LIBRE 3 READER DEVI: 4 refills | Status: AC

## 2023-01-13 MED ORDER — METHOCARBAMOL 750 MG PO TABS
750.0000 mg | ORAL_TABLET | Freq: Three times a day (TID) | ORAL | 0 refills | Status: AC | PRN
Start: 2023-01-13 — End: ?

## 2023-01-13 MED ORDER — BLOOD GLUCOSE METER KIT
PACK | 0 refills | Status: AC
Start: 2023-01-13 — End: ?

## 2023-01-13 MED ORDER — BLOOD GLUCOSE METER KIT
PACK | 0 refills | Status: DC
Start: 2023-01-13 — End: 2023-01-13
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
## 2023-01-13 NOTE — Telephone Encounter (Signed)
Requested Prescriptions  Pending Prescriptions Disp Refills   glipiZIDE (GLUCOTROL XL) 5 MG 24 hr tablet [Pharmacy Med Name: GLIPIZIDE ER 5MG  TABLETS] 60 tablet 1    Sig: TAKE 1 TABLET(5 MG) BY MOUTH TWICE DAILY WITH A MEAL     Endocrinology:  Diabetes - Sulfonylureas Failed - 01/12/2023  8:46 AM      Failed - HBA1C is between 0 and 7.9 and within 180 days    Hemoglobin A1C  Date Value Ref Range Status  10/28/2022 12.3 (A) 4.0 - 5.6 % Final   Hgb A1c MFr Bld  Date Value Ref Range Status  09/24/2022 12.6 (H) 4.8 - 5.6 % Final    Comment:             Prediabetes: 5.7 - 6.4          Diabetes: >6.4          Glycemic control for adults with diabetes: <7.0          Passed - Cr in normal range and within 360 days    Creat  Date Value Ref Range Status  10/13/2010 0.78 0.40 - 1.50 mg/dL Final   Creatinine, Ser  Date Value Ref Range Status  11/25/2022 0.99 0.61 - 1.24 mg/dL Final         Passed - Valid encounter within last 6 months    Recent Outpatient Visits           2 months ago Epididymitis   [facility] Malva Limes, MD   2 months ago Type 2 diabetes mellitus with diabetic neuropathy, with long-term current use of insulin [facility])   [place] [facility] Merita Norton T, FNP   3 months ago Type 2 diabetes mellitus with diabetic neuropathy, without long-term current use of insulin (HCC)   [place] [facility] Malva Limes, MD   3 months ago Uncontrolled diabetes mellitus of other type with hypoglycemia, unspecified hypoglycemia coma status [facility])   [place] [facility] Merita Norton T, FNP   8 months ago Type 2 diabetes mellitus with diabetic neuropathy, without long-term current use of insulin [facility])   [place] [facility] Merita Norton T, [place]       Future Appointments             Today Fisher, Demetrios Isaacs, MD [facility]   Tomorrow [name],  MD [facility]

## 2023-01-13 NOTE — Progress Notes (Signed)
Established patient visit   Patient: [name] [name]   DOB: [birth year]   47 y.o. Male  MRN: [id number] Visit Date: 01/13/2023  Today's healthcare provider: [name], MD   Chief Complaint  Patient presents with   Diabetes   Subjective    HPI HPI   Patient states he has been changing his lifestyle habits and reports his glucose ranges 90-100.  His last A1C was 12.3 on May 22,2024.  He would like to have it checked today as he plans to have surgery and wants to know if this is better. Last edited by Adline Peals, CMA on 01/13/2023  8:13 AM.        Medications: Outpatient Medications Prior to Visit  Medication Sig   Accu-Chek Softclix Lancets lancets Use to check blood sugar daily for type 2 diabetes. E11.9   amLODipine (NORVASC) 10 MG tablet TAKE 1 TABLET(10 MG) BY MOUTH DAILY   aspirin 81 MG tablet Take 81 mg by mouth daily.   blood glucose meter kit and supplies Dispense based on patient and insurance preference. Use daily. (FOR ICD-10 E10.9, E11.9).   Blood Glucose Monitoring Suppl DEVI 1 each by Does not apply route in the morning, at noon, and at bedtime. May substitute to any manufacturer covered by patient's insurance.   Continuous Glucose Sensor (FREESTYLE LIBRE 3 SENSOR) MISC Place 1 sensor on the skin every 14 days. Use to check glucose continuously   ezetimibe (ZETIA) 10 MG tablet Take 1 tablet (10 mg total) by mouth daily.   gabapentin (NEURONTIN) 600 MG tablet Take 1 tablet (600 mg total) by mouth 3 (three) times daily.   glipiZIDE (GLUCOTROL XL) 5 MG 24 hr tablet TAKE 1 TABLET(5 MG) BY MOUTH TWICE DAILY WITH A MEAL   glucose blood (ONETOUCH VERIO) test strip Use as instructed   Insulin Pen Needle (PEN NEEDLES 5/16") 30G X 8 MM MISC 100 each by Does not apply route at bedtime.   losartan (COZAAR) 25 MG tablet Take 1 tablet (25 mg total) by mouth daily.   methocarbamol (ROBAXIN) 750 MG tablet 1/2 TO 1 TABLET BY MOUTH TWICE DAILY AS NEEDED   metoprolol  succinate (TOPROL-XL) 25 MG 24 hr tablet Take 1 tablet (25 mg total) by mouth daily.   nitroGLYCERIN (NITROSTAT) 0.4 MG SL tablet Place 1 tablet (0.4 mg total) under the tongue every 5 (five) minutes as needed for chest pain. Call 9-1-1 if you take 3 tablets.   oxyCODONE (OXY IR/ROXICODONE) 5 MG immediate release tablet Take 1-2 tablets (5-10 mg total) by mouth every 4 (four) hours as needed for severe pain.   pantoprazole (PROTONIX) 40 MG tablet TAKE 1 TABLET(40 MG) BY MOUTH DAILY   QUEtiapine (SEROQUEL) 50 MG tablet Take 50 mg by mouth at bedtime.   rosuvastatin (CRESTOR) 40 MG tablet TAKE 1 TABLET(40 MG) BY MOUTH DAILY   sitaGLIPtin (JANUVIA) 100 MG tablet Take 1 tablet (100 mg total) by mouth daily.   tamsulosin (FLOMAX) 0.4 MG CAPS capsule TAKE 2 CAPSULES(0.8 MG) BY MOUTH DAILY   insulin glargine (LANTUS SOLOSTAR) 100 UNIT/ML Solostar Pen 16 units nightly; check fasting blood sugar daily. Titrate dose every 3 days by 2 units to meet fasting blood sugar of 110-120. (Patient not taking: Reported on 01/13/2023)   No facility-administered medications prior to visit.      Objective    BP 114/83 (BP Location: Left Arm, Patient Position: Sitting, Cuff Size: Large)   Pulse 70   Temp [name])  97.5 F (36.4 C) (Oral)   Ht 6\' 3"  (1.905 m)   Wt 250 lb (113.4 kg)   SpO2 100%   BMI 31.25 kg/m    Physical Exam  General appearance: Mildly obese male, cooperative and in no acute distress Head: Normocephalic, without obvious abnormality, atraumatic Respiratory: Respirations even and unlabored, normal respiratory rate Extremities: All extremities are intact.  Skin: Skin color, texture, turgor normal. No rashes seen  Psych: Appropriate mood and affect. Neurologic: Mental status: Alert, oriented to person, place, and time, thought content appropriate.   Results for orders placed or performed in visit on 01/13/23  POCT glycosylated hemoglobin (Hb A1C)  Result Value Ref Range   Hemoglobin A1C 8.2 (A)  4.0 - 5.6 %   Est. average glucose Bld gHb Est-mCnc 189      Assessment & Plan     1. Type 2 diabetes mellitus with diabetic neuropathy, without long-term current use of insulin (HCC) Much better controlled. Continue Januvia and glipizide.   - Continuous Glucose Receiver (FREESTYLE LIBRE 3 READER) DEVI; Use to check blood sugar for type 2 diabetes on insulin  Dispense: 1 each; Refill: 4   Future Appointments  Date Time Provider Department Center  01/13/2023  2:20 PM Borders, Daryl Eastern, NP [facility] None  01/14/2023  3:30 PM [name], MD [facility] None  01/20/2023  9:00 AM Leafy Ro, MD [facility] None  04/15/2023  9:00 AM Jacky Kindle, FNP [facility]        The entirety of the information documented in the History of Present Illness, Review of Systems and Physical Exam were personally obtained by me. Portions of this information were initially documented by the CMA and reviewed by me for thoroughness and accuracy.     [name], MD  [facility] [phone] (phone) [phone] (fax)  [facility]

## 2023-01-13 NOTE — Patient Instructions (Signed)
.   Please review the attached list of medications and notify my office if there are any errors.   . Please bring all of your medications to every appointment so we can make sure that our medication list is the same as yours.   

## 2023-01-14 ENCOUNTER — Other Ambulatory Visit: Payer: Self-pay | Admitting: Family Medicine

## 2023-01-14 ENCOUNTER — Telehealth (INDEPENDENT_AMBULATORY_CARE_PROVIDER_SITE_OTHER): Admitting: Gastroenterology

## 2023-01-14 DIAGNOSIS — R109 Unspecified abdominal pain: Secondary | ICD-10-CM

## 2023-01-14 DIAGNOSIS — E1169 Type 2 diabetes mellitus with other specified complication: Secondary | ICD-10-CM

## 2023-01-14 NOTE — Progress Notes (Signed)
[name] [name] , MD [address]  [address]  [place], [place] [address]  Main: [phone]  Fax: [phone]   Primary Care Physician: Jacky Kindle, FNP  Virtual Visit via Video Note  I connected with patient on 01/14/23 at  3:30 PM EDT by video and verified that I am speaking with the correct person using two identifiers.   I discussed the limitations, risks, security and privacy concerns of performing an evaluation and management service by video  and the availability of in person appointments. I also discussed with the patient that there may be a patient responsible charge related to this service. The patient expressed understanding and agreed to proceed.  Location of Patient: Home Location of Provider: Home Persons involved: Patient and provider only   History of Present Illness: No chief complaint on file.   HPI: [name] [name] is a 47 y.o. male   Summary of history :   He was last seen back in 2022 for rectal bleeding. Colonoscopy showed internal hemorroids. 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    Interval history     She is here today to see me for abdominal pain in the lower abdomen going on for a few weeks if not longer.  Not related to meals.  Not related to bowel movements not better after the bowel movement points to the area above his pubis.  Denies any NSAID use.  No other aggravating or relieving factors.  Denies any issues with rectal bleeding when I inquired.  He was in his vehicle when we did this video visit there was some issue with the audio.  Current Outpatient Medications  Medication Sig Dispense Refill   Accu-Chek Softclix Lancets lancets Use to check blood sugar daily for type 2 diabetes. E11.9 100 each 4   amLODipine (NORVASC) 10 MG tablet TAKE 1 TABLET(10 MG) BY MOUTH DAILY 90 tablet 1   aspirin 81 MG tablet Take 81 mg by mouth daily.     blood  glucose meter kit and supplies Dispense based on patient and insurance preference. Use daily. (FOR ICD-10 E10.9, E11.9). 1 each 0   Blood Glucose Monitoring Suppl DEVI 1 each by Does not apply route in the morning, at noon, and at bedtime. May substitute to any manufacturer covered by patient's insurance. 1 each 0   Continuous Glucose Receiver (FREESTYLE LIBRE 3 READER) DEVI Use to check blood sugar for type 2 diabetes on insulin 1 each 4   Continuous Glucose Sensor (FREESTYLE LIBRE 3 SENSOR) MISC Place 1 sensor on the skin every 14 days. Use to check glucose continuously 2 each 12   ezetimibe (ZETIA) 10 MG tablet Take 1 tablet (10 mg total) by mouth daily. 90 tablet 1   gabapentin (NEURONTIN) 600 MG tablet Take 1 tablet (600 mg total) by mouth 3 (three) times daily. 90 tablet 0   glipiZIDE (GLUCOTROL XL) 5 MG 24 hr tablet TAKE 1 TABLET(5 MG) BY MOUTH TWICE DAILY WITH A MEAL 60 tablet 1   glucose blood (ONETOUCH VERIO) test strip Use as instructed 100 each 12   insulin glargine (LANTUS SOLOSTAR) 100 UNIT/ML Solostar Pen 16 units nightly; check fasting blood sugar daily. Titrate dose every 3 days by 2 units to meet fasting blood sugar of 110-120. (Patient not taking: Reported on 01/13/2023) 15 mL 11   Insulin Pen Needle (PEN NEEDLES 5/16") 30G X 8 MM MISC 100 each by Does  not apply route at bedtime. 100 each 3   losartan (COZAAR) 25 MG tablet Take 1 tablet (25 mg total) by mouth daily. 90 tablet 1   methocarbamol (ROBAXIN) 750 MG tablet Take 1 tablet (750 mg total) by mouth every 8 (eight) hours as needed for muscle spasms. 90 tablet 0   metoprolol succinate (TOPROL-XL) 25 MG 24 hr tablet Take 1 tablet (25 mg total) by mouth daily. 90 tablet 1   nitroGLYCERIN (NITROSTAT) 0.4 MG SL tablet Place 1 tablet (0.4 mg total) under the tongue every 5 (five) minutes as needed for chest pain. Call 9-1-1 if you take 3 tablets. 25 tablet 4   oxyCODONE (OXY IR/ROXICODONE) 5 MG immediate release tablet Take 1-2 tablets  (5-10 mg total) by mouth every 4 (four) hours as needed for severe pain. 60 tablet 0   pantoprazole (PROTONIX) 40 MG tablet TAKE 1 TABLET(40 MG) BY MOUTH DAILY 90 tablet 0   QUEtiapine (SEROQUEL) 50 MG tablet Take 1 tablet (50 mg total) by mouth at bedtime. 30 tablet 0   rosuvastatin (CRESTOR) 40 MG tablet TAKE 1 TABLET(40 MG) BY MOUTH DAILY 90 tablet 1   sitaGLIPtin (JANUVIA) 100 MG tablet Take 1 tablet (100 mg total) by mouth daily. 90 tablet 0   tamsulosin (FLOMAX) 0.4 MG CAPS capsule TAKE 2 CAPSULES(0.8 MG) BY MOUTH DAILY 180 capsule 3   No current facility-administered medications for this visit.    Allergies as of 01/14/2023 - Review Complete 01/13/2023  Allergen Reaction Noted   Penicillins Hives 10/01/2010   Trulicity [dulaglutide] Diarrhea and Nausea And Vomiting 10/14/2020    Review of Systems:    All systems reviewed and negative except where noted in HPI.  General Appearance:    Alert, cooperative, no distress, appears stated age  Head:    Normocephalic, without obvious abnormality, atraumatic  Eyes:    PERRL, conjunctiva/corneas clear,  Ears:    Grossly normal hearing    Neurologic:  Grossly normal    Observations/Objective:  Labs: CMP     Component Value Date/Time   NA 140 11/25/2022 1831   NA 138 09/24/2022 1349   K 3.4 (L) 11/25/2022 1831   CL 109 11/25/2022 1831   CO2 16 (L) 11/25/2022 1831   GLUCOSE 157 (H) 11/25/2022 1831   BUN 14 11/25/2022 1831   BUN 8 09/24/2022 1349   CREATININE 0.99 11/25/2022 1831   CREATININE 0.78 10/13/2010 1727   CALCIUM 9.5 11/25/2022 1831   PROT 8.7 (H) 11/25/2022 1831   PROT 6.9 09/24/2022 1349   ALBUMIN 4.9 11/25/2022 1831   ALBUMIN 4.5 09/24/2022 1349   AST 20 11/25/2022 1831   ALT 20 11/25/2022 1831   ALKPHOS 52 11/25/2022 1831   BILITOT 0.9 11/25/2022 1831   BILITOT 0.3 09/24/2022 1349   GFRNONAA >60 11/25/2022 1831   GFRAA 110 07/24/2020 1532   Lab Results  Component Value Date   WBC 5.4 12/01/2022   HGB  13.2 12/01/2022   HCT 39.8 12/01/2022   MCV 90.5 12/01/2022   PLT 253 12/01/2022    Imaging Studies: No results found.  Assessment and Plan:   [name] [name] is a 47 y.o. y/o male who with a history of a retroperitoneal mass which is being evaluated by Dr. Cathie Hoops in oncology and Dr. Gershon Mussel and surgery.  He is being planned for a biopsy.  I have been asked to see him for abdominal pain.  Based on his history the abdominal pain is nonspecific does not have features of  IBS or dyspepsia.  It could be related to the underlying process that he is being evaluated for which will be the retroperitoneal mass.  I explained to him that the best option at this point of time since it had been going on for a while would be to wait for evaluation of the retroperitoneal mass and based on its outcome discussed about the next steps which could involve endoscopy.  His recent PET scan has not shown anything else abnormal except what was mentioned in the report that will be related to a GI process.  There are no aspects of biliary pain based on his history.  We decided to have an office visit in 8 weeks to reassess      I discussed the assessment and treatment plan with the patient. The patient was provided an opportunity to ask questions and all were answered. The patient agreed with the plan and demonstrated an understanding of the instructions.   The patient was advised to call back or seek an in-person evaluation if the symptoms worsen or if the condition fails to improve as anticipated.  I provided 12 minutes of face-to-face time during this encounter.  Dr [name] Mood MD,MRCP [address] [facility]) Gastroenterology/Hepatology Pager: [phone]   Speech recognition software was used to dictate this note.

## 2023-01-15 NOTE — Telephone Encounter (Signed)
Requested Prescriptions  Pending Prescriptions Disp Refills   rosuvastatin (CRESTOR) 40 MG tablet [Pharmacy Med Name: ROSUVASTATIN 40MG  TABLETS] 90 tablet 1    Sig: TAKE 1 TABLET(40 MG) BY MOUTH DAILY     Cardiovascular:  Antilipid - Statins 2 Failed - 01/14/2023  3:49 PM      Failed - Lipid Panel in normal range within the last 12 months    Cholesterol, Total  Date Value Ref Range Status  09/24/2022 125 100 - 199 mg/dL Final   LDL Chol Calc (NIH)  Date Value Ref Range Status  09/24/2022 63 0 - 99 mg/dL Final   LDL Direct  Date Value Ref Range Status  06/24/2018 129 (H) 0 - 99 mg/dL Final   HDL  Date Value Ref Range Status  09/24/2022 36 (L) >39 mg/dL Final   Triglycerides  Date Value Ref Range Status  09/24/2022 149 0 - 149 mg/dL Final         Passed - Cr in normal range and within 360 days    Creat  Date Value Ref Range Status  10/13/2010 0.78 0.40 - 1.50 mg/dL Final   Creatinine, Ser  Date Value Ref Range Status  11/25/2022 0.99 0.61 - 1.24 mg/dL Final         Passed - Patient is not pregnant      Passed - Valid encounter within last 12 months    Recent Outpatient Visits           2 days ago Type 2 diabetes mellitus with diabetic neuropathy, without long-term current use of insulin (HCC)   [place] [facility] Malva Limes, MD   2 months ago Epididymitis   [facility] Malva Limes, MD   2 months ago Type 2 diabetes mellitus with diabetic neuropathy, with long-term current use of insulin [facility])   [place] [facility] Merita Norton T, FNP   3 months ago Type 2 diabetes mellitus with diabetic neuropathy, without long-term current use of insulin [facility])   [place] [facility] Malva Limes, MD   3 months ago Uncontrolled diabetes mellitus of other type with hypoglycemia, unspecified hypoglycemia coma status [facility])   [facility] Jacky Kindle, FNP        Future Appointments             In 2 months [name], MD [facility] [employer]   In 3 months Jacky Kindle, FNP [facility], [facility]

## 2023-01-20 ENCOUNTER — Ambulatory Visit (INDEPENDENT_AMBULATORY_CARE_PROVIDER_SITE_OTHER): Admitting: Surgery

## 2023-01-20 ENCOUNTER — Other Ambulatory Visit: Payer: Self-pay

## 2023-01-20 ENCOUNTER — Encounter: Payer: Self-pay | Admitting: Surgery

## 2023-01-20 VITALS — BP 130/86 | HR 57 | Temp 98.2°F | Ht 75.0 in | Wt 246.0 lb

## 2023-01-20 DIAGNOSIS — R1900 Intra-abdominal and pelvic swelling, mass and lump, unspecified site: Secondary | ICD-10-CM

## 2023-01-20 DIAGNOSIS — R1909 Other intra-abdominal and pelvic swelling, mass and lump: Secondary | ICD-10-CM

## 2023-01-20 NOTE — Patient Instructions (Addendum)
You have requested to have surgery. This will be done by Dr Everlene Farrier at [facility]. Please see your (BLUE) Pre-care sheet for more information. Our surgery scheduler will call you to look at surgery dates and to go over surgery information.   You will need to arrange to be out of work for approximately 1-2 weeks and then you may return with a lifting restriction for 4 more weeks. If you have FMLA or Disability paperwork that needs to be filled out, please have your company fax your paperwork to [phone] or you may drop this by either office. This paperwork will be filled out within 3 days after your surgery has been completed.  Diagnostic Laparoscopy with excision of retroperitonal mass.  Diagnostic laparoscopy is a procedure to diagnose problems in the abdomen. It might be done for a variety of reasons, such as to look for scar tissue, a reason for abdominal pain, an abdominal mass or tumor, or fluid in the abdomen (ascites). This procedure may also be done to remove a tissue sample from the liver to look at under a microscope (biopsy). During the procedure, a thin, flexible tube that has a light and a camera on the end (laparoscope) is inserted through a small incision in the abdomen. The image from the camera is shown on a monitor to help the surgeon see inside the body. Tell a health care provider about: Any allergies you have. All medicines you are taking, including vitamins, herbs, eye drops, creams, and over-the-counter medicines. Any problems you or family members have had with anesthetic medicines. Any blood disorders you have. Any surgeries you have had. Any medical conditions you have. Whether you are pregnant or may be pregnant. What are the risks? Generally, this is a safe procedure. However, problems may occur, including: Infection. Bleeding. Allergic reactions to medicines or dyes. Damage to abdominal structures or organs, such as the intestines, liver, stomach, or spleen. What happens  before the procedure? Staying hydrated Follow instructions from your health care provider about hydration. Medicines Ask your health care provider about: Changing or stopping your regular medicines. This is especially important if you are taking diabetes medicines or blood thinners. Taking medicines such as aspirin and ibuprofen. These medicines can thin your blood. Do not take these medicines unless your health care provider tells you to take them. Taking over-the-counter medicines, vitamins, herbs, and supplements. General instructions Ask your health care provider: How your surgery site will be marked. What steps will be taken to help prevent infection. These steps may include: Removing hair at the surgery site. Washing skin with a germ-killing soap. Taking antibiotic medicine. Plan to have a responsible adult take you home from the hospital or clinic. Plan to have a responsible adult care for you for the time you are told after you leave the hospital or clinic. This is important. What happens during the procedure?  An IV will be inserted into one of your veins. You will be given one or more of the following: A medicine to help you relax (sedative). A medicine to numb the area (local anesthetic). A medicine to make you fall asleep (general anesthetic). A breathing tube will be placed down your throat to help you breathe during the procedure. Your abdomen will be filled with an air-like gas so that your abdomen expands. This will give the surgeon more room to operate and will make your organs easier to see. Many small incisions will be made in your abdomen. A laparoscope and other surgical instruments will  be inserted into your abdomen through these incisions. A biopsy may be done. This will depend on the reason why you are having this procedure. The laparoscope and other instruments will be removed from your abdomen. The air-like gas will be released from your abdomen. Your  incisions will be closed with stitches (sutures), skin glue, or surgical tapes and covered with a bandage (dressing). Your breathing tube will be removed. The procedure may vary among health care providers and hospitals. What happens after the procedure? Your blood pressure, heart rate, breathing rate, and blood oxygen level will be monitored until you leave the hospital or clinic. If you were given a sedative during the procedure, it can affect you for several hours. Do not drive or operate machinery until your health care provider says that it is safe. It is up to you to get the results of your procedure. Ask your health care provider, or the department that is doing the procedure, when your results will be ready. Summary Diagnostic laparoscopy is a procedure to diagnose problems in the abdomen using a thin, flexible tube that has a light and a camera on the end (laparoscope). Follow instructions from your health care provider about how to prepare for the procedure. Plan to have a responsible adult care for you for the time you are told after you leave the hospital or clinic. This is important. This information is not intended to replace advice given to you by your health care provider. Make sure you discuss any questions you have with your health care provider. Document Revised: 01/19/2020 Document Reviewed: 01/19/2020 Elsevier Patient Education  2024 [employer].

## 2023-01-21 ENCOUNTER — Telehealth: Payer: Self-pay | Admitting: Surgery

## 2023-01-21 NOTE — Progress Notes (Signed)
Outpatient Surgical Follow Up  01/21/2023  [name] [name] is an 47 y.o. male.   Chief Complaint  Patient presents with   Follow-up    HPI:  [name] [name] is a 47 y.o. male seen in f/u. During abd pain and diarrhea  workup was  a CT scan showed a retroperitoneal mass.  This has been seen by oncology and further workup has been done Previous labs were reviewed.  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  There Is also evidence of right hydroureter and there might be compression of the retroperitoneal mass into the right iliac artery. He Continues to have intermittent lower back pain, radiated to his right groin and right thigh. He has seen neurosurgery. He has taken gabapentin which has not helped him. He has tried physical therapy which did not help him.  Did have a recent normal CBC.  He has uncontrolled diabetes. Is able to perform more than 4 METS of activity without any shortness of breath or chest pain.  Does have a history of coronary artery disease.  Past Medical History:  Diagnosis Date   ADHD (attention deficit hyperactivity disorder)    Allergy    Arthritis    CAD (coronary artery disease)    S/P cath May 2012 with BMS to the LAD following abnormal stress test   Diabetes mellitus    Drug abuse (HCC)    history of marijuana use and alcohol   GERD (gastroesophageal reflux disease)    Hypercholesterolemia    Hypertension    Myocardial infarction (HCC)    Obesity    Oxygen deficiency    Sleep apnea     Past Surgical History:  Procedure Laterality Date   ABSCESS DRAINAGE     right arm; approx 2003   CARDIAC CATHETERIZATION     COLONOSCOPY WITH PROPOFOL N/A 10/10/2020   Procedure: COLONOSCOPY WITH PROPOFOL;  Surgeon: [name], MD;  Location: [facility] ENDOSCOPY;  Service: Gastroenterology;  Laterality: N/A;   CORONARY ANGIOPLASTY     CORONARY STENT PLACEMENT  May 2012   LAD     Family History  Problem Relation Age of Onset   Hypertension Mother    Diabetes Mother    Breast cancer Mother    Multiple myeloma Mother    Hypertension Father    Diabetes Father    Heart disease Father        cabg 3; 19; 103 stents   Hypertension Sister    Heart disease Paternal Uncle    Heart disease Paternal Grandmother     Social History:  reports that he quit smoking about 5 years ago. His smoking use included cigarettes. He started smoking about 19 years ago. He has a 14 pack-year smoking history. He has been exposed to tobacco smoke. He has never used smokeless tobacco. He reports that he does not currently use alcohol after a past usage of about 10.0 standard drinks of alcohol per week. He reports current drug use. Frequency: 20.00 times per week. Drug: Marijuana.  Allergies:  Allergies  Allergen Reactions   Penicillins Hives   Trulicity [Dulaglutide] Diarrhea and Nausea And Vomiting    Medications reviewed.    ROS Full ROS performed and is otherwise negative other than what is stated in HPI   BP 130/86   Pulse (!) 57   Temp 98.2 F (36.8 C)   Ht 6\' 3"  (1.905 m)   Wt 246 lb (111.6 kg)  SpO2 98%   BMI 30.75 kg/m   Physical Exam  CONSTITUTIONAL: NAD. EYES: Pupils are equal, round, Sclera are non-icteric. EARS, NOSE, MOUTH AND THROAT: The oropharynx is clear. The oral mucosa is pink and moist. Hearing is intact to voice. LYMPH NODES:  Lymph nodes in the neck are normal. RESPIRATORY:  Lungs are clear. There is normal respiratory effort, with equal breath sounds bilaterally, and without pathologic use of accessory muscles. CARDIOVASCULAR: Heart is regular without murmurs, gallops, or rubs. GI: The abdomen is  soft, mild tenderness suprapubic area and bilateral groins w/o peritonitis, masses or hernias. There are no palpable masses. There is no hepatosplenomegaly. There are normal bowel sounds GU: Rectal deferred.   MUSCULOSKELETAL: Normal muscle strength  and tone. No cyanosis or edema.   SKIN: Turgor is good and there are no pathologic skin lesions or ulcers. NEUROLOGIC: Motor and sensation is grossly normal. Cranial nerves are grossly intact. PSYCH:  Oriented to person, place and time. Affect is normal.  Assessment/Plan:  47 year old male with retroperitoneal mass in close proximity to the right ureter right iliac and distal descending abdominal aorta.  There is concern for potential malignancy versus lymphoma and the next  step will be a biopsy.  Unable to do percutaneous biopsy due to vascular structure next to it.   I do think that from our surgical standpoint we will be able to do a robotic assisted excision also with guidance and cannulization of the right Ureter by GU using ICG. Another discussion with him and his wife regarding this procedure.  I was very honest with him and told him that this is not likely causing some of his lower abdominal pain and this will be fired diagnostic purposes.  Procedure discussed with him in detail.  Risk, benefits and possible complications including but not limited to: Bleeding, infection injury to bowel or ureter or other vascular structures were discussed in detail.  He understands.  I think that he wishes to think about what we talked today and come back in a couple weeks. I also encourage to do cessation of cannabinoids products as these have been associated w chronic GI issues. I have d/w Urology about ICG injection into the Right ureter as well  Please note that I spent 40 minutes in this encounter including personally reviewing multiple medical records, images studies,, coordinating his care, performing appropriate documentation .  A copy of this report was sent to the referring provider      Sterling Big, MD [facility] General Surgeon

## 2023-01-21 NOTE — H&P (View-Only) (Signed)
 Outpatient Surgical Follow Up  01/21/2023  [name] [name] is an 47 y.o. male.   Chief Complaint  Patient presents with   Follow-up    HPI:  [name] [name] is a 47 y.o. male seen in f/u. During abd pain and diarrhea  workup was  a CT scan showed a retroperitoneal mass.  This has been seen by oncology and further workup has been done Previous labs were reviewed.  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  There Is also evidence of right hydroureter and there might be compression of the retroperitoneal mass into the right iliac artery. He Continues to have intermittent lower back pain, radiated to his right groin and right thigh. He has seen neurosurgery. He has taken gabapentin which has not helped him. He has tried physical therapy which did not help him.  Did have a recent normal CBC.  He has uncontrolled diabetes. Is able to perform more than 4 METS of activity without any shortness of breath or chest pain.  Does have a history of coronary artery disease.  Past Medical History:  Diagnosis Date   ADHD (attention deficit hyperactivity disorder)    Allergy    Arthritis    CAD (coronary artery disease)    S/P cath May 2012 with BMS to the LAD following abnormal stress test   Diabetes mellitus    Drug abuse (HCC)    history of marijuana use and alcohol   GERD (gastroesophageal reflux disease)    Hypercholesterolemia    Hypertension    Myocardial infarction (HCC)    Obesity    Oxygen deficiency    Sleep apnea     Past Surgical History:  Procedure Laterality Date   ABSCESS DRAINAGE     right arm; approx 2003   CARDIAC CATHETERIZATION     COLONOSCOPY WITH PROPOFOL N/A 10/10/2020   Procedure: COLONOSCOPY WITH PROPOFOL;  Surgeon: [name], MD;  Location: [facility] ENDOSCOPY;  Service: Gastroenterology;  Laterality: N/A;   CORONARY ANGIOPLASTY     CORONARY STENT PLACEMENT  May 2012   LAD     Family History  Problem Relation Age of Onset   Hypertension Mother    Diabetes Mother    Breast cancer Mother    Multiple myeloma Mother    Hypertension Father    Diabetes Father    Heart disease Father        cabg 3; 19; 103 stents   Hypertension Sister    Heart disease Paternal Uncle    Heart disease Paternal Grandmother     Social History:  reports that he quit smoking about 5 years ago. His smoking use included cigarettes. He started smoking about 19 years ago. He has a 14 pack-year smoking history. He has been exposed to tobacco smoke. He has never used smokeless tobacco. He reports that he does not currently use alcohol after a past usage of about 10.0 standard drinks of alcohol per week. He reports current drug use. Frequency: 20.00 times per week. Drug: Marijuana.  Allergies:  Allergies  Allergen Reactions   Penicillins Hives   Trulicity [Dulaglutide] Diarrhea and Nausea And Vomiting    Medications reviewed.    ROS Full ROS performed and is otherwise negative other than what is stated in HPI   BP 130/86   Pulse (!) 57   Temp 98.2 F (36.8 C)   Ht 6\' 3"  (1.905 m)   Wt 246 lb (111.6 kg)  SpO2 98%   BMI 30.75 kg/m   Physical Exam  CONSTITUTIONAL: NAD. EYES: Pupils are equal, round, Sclera are non-icteric. EARS, NOSE, MOUTH AND THROAT: The oropharynx is clear. The oral mucosa is pink and moist. Hearing is intact to voice. LYMPH NODES:  Lymph nodes in the neck are normal. RESPIRATORY:  Lungs are clear. There is normal respiratory effort, with equal breath sounds bilaterally, and without pathologic use of accessory muscles. CARDIOVASCULAR: Heart is regular without murmurs, gallops, or rubs. GI: The abdomen is  soft, mild tenderness suprapubic area and bilateral groins w/o peritonitis, masses or hernias. There are no palpable masses. There is no hepatosplenomegaly. There are normal bowel sounds GU: Rectal deferred.   MUSCULOSKELETAL: Normal muscle strength  and tone. No cyanosis or edema.   SKIN: Turgor is good and there are no pathologic skin lesions or ulcers. NEUROLOGIC: Motor and sensation is grossly normal. Cranial nerves are grossly intact. PSYCH:  Oriented to person, place and time. Affect is normal.  Assessment/Plan:  47 year old male with retroperitoneal mass in close proximity to the right ureter right iliac and distal descending abdominal aorta.  There is concern for potential malignancy versus lymphoma and the next  step will be a biopsy.  Unable to do percutaneous biopsy due to vascular structure next to it.   I do think that from our surgical standpoint we will be able to do a robotic assisted excision also with guidance and cannulization of the right Ureter by GU using ICG. Another discussion with him and his wife regarding this procedure.  I was very honest with him and told him that this is not likely causing some of his lower abdominal pain and this will be fired diagnostic purposes.  Procedure discussed with him in detail.  Risk, benefits and possible complications including but not limited to: Bleeding, infection injury to bowel or ureter or other vascular structures were discussed in detail.  He understands.  I think that he wishes to think about what we talked today and come back in a couple weeks. I also encourage to do cessation of cannabinoids products as these have been associated w chronic GI issues. I have d/w Urology about ICG injection into the Right ureter as well  Please note that I spent 40 minutes in this encounter including personally reviewing multiple medical records, images studies,, coordinating his care, performing appropriate documentation .  A copy of this report was sent to the referring provider      Sterling Big, MD [facility] General Surgeon

## 2023-01-21 NOTE — Telephone Encounter (Signed)
Patient called back and was informed of surgery information.

## 2023-01-21 NOTE — Telephone Encounter (Signed)
Left message for patient to call, please inform him of the following regarding scheduled surgery with Dr. Everlene Farrier.   Pre-Admission date/time, and Surgery date at [facility].  Surgery Date: 01/28/23 Preadmission Testing Date: 01/25/23 (phone 1p-4p)  Also patient will need to call at [phone], between 1-3:00pm the day before surgery, to find out what time to arrive for surgery.

## 2023-01-22 ENCOUNTER — Other Ambulatory Visit: Payer: Self-pay | Admitting: Family Medicine

## 2023-01-22 ENCOUNTER — Other Ambulatory Visit: Payer: Self-pay | Admitting: Physician Assistant

## 2023-01-22 DIAGNOSIS — K2100 Gastro-esophageal reflux disease with esophagitis, without bleeding: Secondary | ICD-10-CM

## 2023-01-22 DIAGNOSIS — I10 Essential (primary) hypertension: Secondary | ICD-10-CM

## 2023-01-22 NOTE — Telephone Encounter (Signed)
Rx 12/08/22 #90- too soon Requested Prescriptions  Pending Prescriptions Disp Refills   pantoprazole (PROTONIX) 40 MG tablet [Pharmacy Med Name: PANTOPRAZOLE 40MG  TABLETS] 90 tablet 0    Sig: TAKE 1 TABLET(40 MG) BY MOUTH DAILY     Gastroenterology: Proton Pump Inhibitors Passed - 01/22/2023  7:05 AM      Passed - Valid encounter within last 12 months    Recent Outpatient Visits           1 week ago Type 2 diabetes mellitus with diabetic neuropathy, without long-term current use of insulin (HCC)   [place] [facility] Malva Limes, MD   2 months ago Epididymitis   [facility] Malva Limes, MD   2 months ago Type 2 diabetes mellitus with diabetic neuropathy, with long-term current use of insulin [facility])   [place] [facility] Merita Norton T, FNP   3 months ago Type 2 diabetes mellitus with diabetic neuropathy, without long-term current use of insulin [facility])   [place] [facility] Malva Limes, MD   4 months ago Uncontrolled diabetes mellitus of other type with hypoglycemia, unspecified hypoglycemia coma status [facility])   [place] [facility] Jacky Kindle, FNP       Future Appointments             In 1 month [name], MD [facility] at [place]   In 2 months Jacky Kindle, FNP [facility]

## 2023-01-25 ENCOUNTER — Encounter: Payer: Self-pay | Admitting: Oncology

## 2023-01-25 ENCOUNTER — Inpatient Hospital Stay
Admission: RE | Admit: 2023-01-25 | Discharge: 2023-01-25 | Disposition: A | Discharge status: Home / Self Care | Source: Ambulatory Visit

## 2023-01-25 NOTE — Telephone Encounter (Signed)
Requested Prescriptions  Pending Prescriptions Disp Refills   losartan (COZAAR) 25 MG tablet [Pharmacy Med Name: LOSARTAN 25MG  TABLETS] 90 tablet 1    Sig: TAKE 1 TABLET(25 MG) BY MOUTH DAILY     Cardiovascular:  Angiotensin Receptor Blockers Failed - 01/22/2023  5:34 PM      Failed - K in normal range and within 180 days    Potassium  Date Value Ref Range Status  11/25/2022 3.4 (L) 3.5 - 5.1 mmol/L Final         Passed - Cr in normal range and within 180 days    Creat  Date Value Ref Range Status  10/13/2010 0.78 0.40 - 1.50 mg/dL Final   Creatinine, Ser  Date Value Ref Range Status  11/25/2022 0.99 0.61 - 1.24 mg/dL Final         Passed - Patient is not pregnant      Passed - Last BP in normal range    BP Readings from Last 1 Encounters:  01/20/23 130/86         Passed - Valid encounter within last 6 months    Recent Outpatient Visits           1 week ago Type 2 diabetes mellitus with diabetic neuropathy, without long-term current use of insulin (HCC)   [place] [facility] Malva Limes, MD   2 months ago Epididymitis   [facility] Malva Limes, MD   2 months ago Type 2 diabetes mellitus with diabetic neuropathy, with long-term current use of insulin [facility])   [place] [facility] Merita Norton T, FNP   3 months ago Type 2 diabetes mellitus with diabetic neuropathy, without long-term current use of insulin [facility])   [place] [facility] Malva Limes, MD   4 months ago Uncontrolled diabetes mellitus of other type with hypoglycemia, unspecified hypoglycemia coma status [facility])    [facility] Jacky Kindle, FNP       Future Appointments             In 1 month [name], MD [facility] [place] [facility] at [place]   In 2 months Jacky Kindle, FNP [facility]

## 2023-01-25 NOTE — Patient Instructions (Addendum)
Your procedure is scheduled on: 01/28/23 - Thursday Report to the Registration Desk on the 1st floor of the Medical Mall. To find out your arrival time, please call [phone] between 1PM - 3PM on: 01/27/23 - Wednesday If your arrival time is 6:00 am, do not arrive before that time as the Medical Mall entrance doors do not open until 6:00 am.  REMEMBER: Instructions that are not followed completely may result in serious medical risk, up to and including death; or upon the discretion of your surgeon and anesthesiologist your surgery may need to be rescheduled.  Do not eat food after midnight the night before surgery.  No gum chewing or hard candies.  You may drink water up to 2 hours before you are scheduled to arrive for your surgery. Do not drink anything within 2 hours of your scheduled arrival time.  One week prior to surgery: Stop Anti-inflammatories (NSAIDS) such as Advil, Aleve, Ibuprofen, Motrin, Naproxen, Naprosyn and Aspirin based products such as Excedrin, Goody's Powder, BC Powder.  Stop ANY OVER THE COUNTER supplements until after surgery.  You may however, continue to take Tylenol if needed for pain up until the day of surgery.  Continue taking all prescribed medications with the exception of the following:  - Hold losartan (COZAAR) on the day of surgery - hold sitaGLIPtin (JANUVIA) on the morning of surgery - Hold glipiZIDE (GLUCOTROL XL) on the morning of surgery - Hold Aspirin on the day of surgery.  TAKE ONLY THESE MEDICATIONS THE MORNING OF SURGERY WITH A SIP OF WATER:  pantoprazole (PROTONIX)-(take one the night before and one on the morning of surgery - helps to prevent nausea after surgery.) amLODipine (NORVASC)  ezetimibe (ZETIA)  gabapentin (NEURONTIN)  metoprolol succinate (TOPROL-XL)  oxyCODONE (OXY IR/ROXICODONE) if needed rosuvastatin (CRESTOR)  tamsulosin (FLOMAX)    No Alcohol for 24 hours before or after surgery.  No Smoking including  e-cigarettes for 24 hours before surgery.  No chewable tobacco products for at least 6 hours before surgery.  No nicotine patches on the day of surgery.  Do not use any "recreational" drugs for at least a week (preferably 2 weeks) before your surgery.  Please be advised that the combination of cocaine and anesthesia may have negative outcomes, up to and including death. If you test positive for cocaine, your surgery will be cancelled.  On the morning of surgery brush your teeth with toothpaste and water, you may rinse your mouth with mouthwash if you wish. Do not swallow any toothpaste or mouthwash.  Use CHG Soap or wipes as directed on instruction sheet.  Do not wear jewelry, make-up, hairpins, clips or nail polish.  Do not wear lotions, powders, or perfumes.   Do not shave body hair from the neck down 48 hours before surgery.  Contact lenses, hearing aids and dentures may not be worn into surgery.  Do not bring valuables to the hospital. [facility] is not responsible for any missing/lost belongings or valuables.   Bring your C-PAP to the hospital in case you may have to spend the night.   Notify your doctor if there is any change in your medical condition (cold, fever, infection).  Wear comfortable clothing (specific to your surgery type) to the hospital.  After surgery, you can help prevent lung complications by doing breathing exercises.  Take deep breaths and cough every 1-2 hours. Your doctor may order a device called an Incentive Spirometer to help you take deep breaths. When coughing or sneezing, hold a  pillow firmly against your incision with both hands. This is called "splinting." Doing this helps protect your incision. It also decreases belly discomfort.  If you are being admitted to the hospital overnight, leave your suitcase in the car. After surgery it may be brought to your room.  In case of increased patient census, it may be necessary for you, the patient, to  continue your postoperative care in the Same Day Surgery department.  If you are being discharged the day of surgery, you will not be allowed to drive home. You will need a responsible individual to drive you home and stay with you for 24 hours after surgery.   If you are taking public transportation, you will need to have a responsible individual with you.  Please call the Pre-admissions Testing Dept. at [phone] if you have any questions about these instructions.  Surgery Visitation Policy:  Patients having surgery or a procedure may have two visitors.  Children under the age of 20 must have an adult with them who is not the patient.  Inpatient Visitation:    Visiting hours are 7 a.m. to 8 p.m. Up to four visitors are allowed at one time in a patient room. The visitors may rotate out with other people during the day.  One visitor age 72 or older may stay with the patient overnight and must be in the room by 8 p.m.   Preparing for Surgery with CHLORHEXIDINE GLUCONATE (CHG) Soap  Chlorhexidine Gluconate (CHG) Soap  o An antiseptic cleaner that kills germs and bonds with the skin to continue killing germs even after washing  o Used for showering the night before surgery and morning of surgery  Before surgery, you can play an important role by reducing the number of germs on your skin.  CHG (Chlorhexidine gluconate) soap is an antiseptic cleanser which kills germs and bonds with the skin to continue killing germs even after washing.  Please do not use if you have an allergy to CHG or antibacterial soaps. If your skin becomes reddened/irritated stop using the CHG.  1. Shower the NIGHT BEFORE SURGERY and the MORNING OF SURGERY with CHG soap.  2. If you choose to wash your hair, wash your hair first as usual with your normal shampoo.  3. After shampooing, rinse your hair and body thoroughly to remove the shampoo.  4. Use CHG as you would any other liquid soap. You can apply  CHG directly to the skin and wash gently with a scrungie or a clean washcloth.  5. Apply the CHG soap to your body only from the neck down. Do not use on open wounds or open sores. Avoid contact with your eyes, ears, mouth, and genitals (private parts). Wash face and genitals (private parts) with your normal soap.  6. Wash thoroughly, paying special attention to the area where your surgery will be performed.  7. Thoroughly rinse your body with warm water.  8. Do not shower/wash with your normal soap after using and rinsing off the CHG soap.  9. Pat yourself dry with a clean towel.  10. Wear clean pajamas to bed the night before surgery.  12. Place clean sheets on your bed the night of your first shower and do not sleep with pets.  13. Shower again with the CHG soap on the day of surgery prior to arriving at the hospital.  14. Do not apply any deodorants/lotions/powders.  15. Please wear clean clothes to the hospital.

## 2023-01-26 ENCOUNTER — Inpatient Hospital Stay
Admission: RE | Admit: 2023-01-26 | Discharge: 2023-01-26 | Disposition: A | Discharge status: Home / Self Care | Source: Ambulatory Visit

## 2023-01-26 HISTORY — DX: Other intervertebral disc displacement, lumbar region: M51.26

## 2023-01-26 HISTORY — DX: Personal history of other diseases of the digestive system: Z87.19

## 2023-01-26 HISTORY — DX: Other intervertebral disc degeneration, lumbar region: M51.36

## 2023-01-26 HISTORY — DX: Other intervertebral disc degeneration, lumbar region without mention of lumbar back pain or lower extremity pain: M51.369

## 2023-01-26 NOTE — Pre-Procedure Instructions (Signed)
Attempted PAT call , not able to reach patient at x 2 numbers, not able to leave VM, will continue to try, Jena Gauss, CMA made aware at Dr. Herma Mering office.

## 2023-01-27 ENCOUNTER — Encounter
Admission: RE | Admit: 2023-01-27 | Discharge: 2023-01-27 | Disposition: A | Discharge status: Home / Self Care | Source: Ambulatory Visit | Attending: Surgery | Admitting: Surgery

## 2023-01-27 ENCOUNTER — Other Ambulatory Visit: Payer: Self-pay

## 2023-01-27 VITALS — Ht 75.0 in | Wt 250.0 lb

## 2023-01-27 DIAGNOSIS — E1159 Type 2 diabetes mellitus with other circulatory complications: Secondary | ICD-10-CM | POA: Insufficient documentation

## 2023-01-27 DIAGNOSIS — E1140 Type 2 diabetes mellitus with diabetic neuropathy, unspecified: Secondary | ICD-10-CM

## 2023-01-27 DIAGNOSIS — I152 Hypertension secondary to endocrine disorders: Secondary | ICD-10-CM | POA: Insufficient documentation

## 2023-01-27 DIAGNOSIS — Z01810 Encounter for preprocedural cardiovascular examination: Secondary | ICD-10-CM | POA: Insufficient documentation

## 2023-01-27 DIAGNOSIS — Z955 Presence of coronary angioplasty implant and graft: Secondary | ICD-10-CM | POA: Insufficient documentation

## 2023-01-27 DIAGNOSIS — Z01812 Encounter for preprocedural laboratory examination: Secondary | ICD-10-CM

## 2023-01-27 NOTE — Patient Instructions (Addendum)
Your procedure is scheduled on: Thursday, August 22 Report to the Registration Desk on the 1st floor of the [facility] To find out your arrival time, please call [phone] between 1PM - 3PM on: Wednesday, August 21 If your arrival time is 6:00 am, do not arrive before that time as the [facility] entrance doors do not open until 6:00 am.  REMEMBER: Instructions that are not followed completely may result in serious medical risk, up to and including death; or upon the discretion of your surgeon and anesthesiologist your surgery may need to be rescheduled.  Do not eat food after midnight the night before surgery.  No gum chewing or hard candies.  You may however, drink water up to 2 hours before you are scheduled to arrive for your surgery. Do not drink anything within 2 hours of your scheduled arrival time.  One week prior to surgery: Stop aspirin and Anti-inflammatories (NSAIDS) such as Advil, Aleve, Ibuprofen, Motrin, Naproxen, Naprosyn and Aspirin based products such as Excedrin, Goody's Powder, BC Powder. Stop ANY OVER THE COUNTER supplements until after surgery. You may however, continue to take Tylenol if needed for pain up until the day of surgery.  Continue taking all prescribed medications   TAKE ONLY THESE MEDICATIONS THE MORNING OF SURGERY WITH A SIP OF WATER:  Amlodipine Ezetimibe (Zetia) Metoprolol Pantoprazole (Protonix) - (take one the night before and one on the morning of surgery - helps to prevent nausea after surgery.) Rosuvastatin (Crestor) Tamsulosin (Flomax)  Do not take any insulin on the day of surgery.  No Alcohol for 24 hours before or after surgery.  No Smoking including e-cigarettes for 24 hours before surgery.  No chewable tobacco products for at least 6 hours before surgery.  No nicotine patches on the day of surgery.  Do not use any "recreational" drugs for at least a week (preferably 2 weeks) before your surgery.  Please be advised that  the combination of cocaine and anesthesia may have negative outcomes, up to and including death. If you test positive for cocaine, your surgery will be cancelled.  On the morning of surgery brush your teeth with toothpaste and water, you may rinse your mouth with mouthwash if you wish. Do not swallow any toothpaste or mouthwash.  Use CHG Soap as directed on instruction sheet.  Do not wear jewelry, make-up, hairpins, clips or nail polish.  Do not wear lotions, powders, or perfumes.   Do not shave body hair from the neck down 48 hours before surgery.  Contact lenses, hearing aids and dentures may not be worn into surgery.  Do not bring valuables to the hospital. [facility] is not responsible for any missing/lost belongings or valuables.   Notify your doctor if there is any change in your medical condition (cold, fever, infection).  Wear comfortable clothing (specific to your surgery type) to the hospital.  After surgery, you can help prevent lung complications by doing breathing exercises.  Take deep breaths and cough every 1-2 hours. Your doctor may order a device called an Incentive Spirometer to help you take deep breaths. When coughing or sneezing, hold a pillow firmly against your incision with both hands. This is called "splinting." Doing this helps protect your incision. It also decreases belly discomfort.  If you are being admitted to the hospital overnight, leave your suitcase in the car. After surgery it may be brought to your room.  In case of increased patient census, it may be necessary for you, the patient, to  continue your postoperative care in the Same Day Surgery department.  If you are being discharged the day of surgery, you will not be allowed to drive home. You will need a responsible individual to drive you home and stay with you for 24 hours after surgery.   If you are taking public transportation, you will need to have a responsible individual with  you.  Please call the Pre-admissions Testing Dept. at [phone] if you have any questions about these instructions.  Surgery Visitation Policy:  Patients having surgery or a procedure may have two visitors.  Children under the age of 48 must have an adult with them who is not the patient.  Inpatient Visitation:    Visiting hours are 7 a.m. to 8 p.m. Up to four visitors are allowed at one time in a patient room. The visitors may rotate out with other people during the day.  One visitor age 101 or older may stay with the patient overnight and must be in the room by 8 p.m.     Preparing for Surgery with CHLORHEXIDINE GLUCONATE (CHG) Soap  Chlorhexidine Gluconate (CHG) Soap  o An antiseptic cleaner that kills germs and bonds with the skin to continue killing germs even after washing  o Used for showering the night before surgery and morning of surgery  Before surgery, you can play an important role by reducing the number of germs on your skin.  CHG (Chlorhexidine gluconate) soap is an antiseptic cleanser which kills germs and bonds with the skin to continue killing germs even after washing.  Please do not use if you have an allergy to CHG or antibacterial soaps. If your skin becomes reddened/irritated stop using the CHG.  1. Shower the NIGHT BEFORE SURGERY and the MORNING OF SURGERY with CHG soap.  2. If you choose to wash your hair, wash your hair first as usual with your normal shampoo.  3. After shampooing, rinse your hair and body thoroughly to remove the shampoo.  4. Use CHG as you would any other liquid soap. You can apply CHG directly to the skin and wash gently with a scrungie or a clean washcloth.  5. Apply the CHG soap to your body only from the neck down. Do not use on open wounds or open sores. Avoid contact with your eyes, ears, mouth, and genitals (private parts). Wash face and genitals (private parts) with your normal soap.  6. Wash thoroughly, paying special  attention to the area where your surgery will be performed.  7. Thoroughly rinse your body with warm water.  8. Do not shower/wash with your normal soap after using and rinsing off the CHG soap.  9. Pat yourself dry with a clean towel.  10. Wear clean pajamas to bed the night before surgery.  12. Place clean sheets on your bed the night of your first shower and do not sleep with pets.  13. Shower again with the CHG soap on the day of surgery prior to arriving at the hospital.  14. Do not apply any deodorants/lotions/powders.  15. Please wear clean clothes to the hospital.

## 2023-01-28 ENCOUNTER — Other Ambulatory Visit: Payer: Self-pay

## 2023-01-28 ENCOUNTER — Encounter: Payer: Self-pay | Admitting: Surgery

## 2023-01-28 ENCOUNTER — Emergency Department

## 2023-01-28 ENCOUNTER — Encounter
Admission: RE | Disposition: A | Discharge status: Home / Self Care | Payer: Self-pay | Source: Home / Self Care | Attending: Surgery

## 2023-01-28 ENCOUNTER — Inpatient Hospital Stay: Admitting: Certified Registered Nurse Anesthetist

## 2023-01-28 ENCOUNTER — Inpatient Hospital Stay
Admission: EM | Admit: 2023-01-28 | Discharge: 2023-02-01 | DRG: 389 | Disposition: A | Discharge status: Home / Self Care | Attending: Internal Medicine | Admitting: Internal Medicine

## 2023-01-28 ENCOUNTER — Ambulatory Visit (HOSPITAL_COMMUNITY)
Admission: RE | Admit: 2023-01-28 | Discharge: 2023-01-28 | Disposition: A | Discharge status: Home / Self Care | Source: Home / Self Care | Attending: Surgery | Admitting: Surgery

## 2023-01-28 DIAGNOSIS — E669 Obesity, unspecified: Secondary | ICD-10-CM | POA: Insufficient documentation

## 2023-01-28 DIAGNOSIS — E785 Hyperlipidemia, unspecified: Secondary | ICD-10-CM | POA: Insufficient documentation

## 2023-01-28 DIAGNOSIS — G4730 Sleep apnea, unspecified: Secondary | ICD-10-CM | POA: Insufficient documentation

## 2023-01-28 DIAGNOSIS — Z7982 Long term (current) use of aspirin: Secondary | ICD-10-CM

## 2023-01-28 DIAGNOSIS — E7800 Pure hypercholesterolemia, unspecified: Secondary | ICD-10-CM | POA: Insufficient documentation

## 2023-01-28 DIAGNOSIS — K219 Gastro-esophageal reflux disease without esophagitis: Secondary | ICD-10-CM | POA: Insufficient documentation

## 2023-01-28 DIAGNOSIS — M1990 Unspecified osteoarthritis, unspecified site: Secondary | ICD-10-CM | POA: Diagnosis present

## 2023-01-28 DIAGNOSIS — E1142 Type 2 diabetes mellitus with diabetic polyneuropathy: Secondary | ICD-10-CM | POA: Diagnosis not present

## 2023-01-28 DIAGNOSIS — Z6831 Body mass index (BMI) 31.0-31.9, adult: Secondary | ICD-10-CM

## 2023-01-28 DIAGNOSIS — R1900 Intra-abdominal and pelvic swelling, mass and lump, unspecified site: Secondary | ICD-10-CM | POA: Diagnosis not present

## 2023-01-28 DIAGNOSIS — R1030 Lower abdominal pain, unspecified: Secondary | ICD-10-CM | POA: Diagnosis not present

## 2023-01-28 DIAGNOSIS — F909 Attention-deficit hyperactivity disorder, unspecified type: Secondary | ICD-10-CM | POA: Diagnosis not present

## 2023-01-28 DIAGNOSIS — K56609 Unspecified intestinal obstruction, unspecified as to partial versus complete obstruction: Principal | ICD-10-CM | POA: Diagnosis present

## 2023-01-28 DIAGNOSIS — Z955 Presence of coronary angioplasty implant and graft: Secondary | ICD-10-CM

## 2023-01-28 DIAGNOSIS — K654 Sclerosing mesenteritis: Secondary | ICD-10-CM | POA: Insufficient documentation

## 2023-01-28 DIAGNOSIS — I10 Essential (primary) hypertension: Secondary | ICD-10-CM | POA: Insufficient documentation

## 2023-01-28 DIAGNOSIS — Z880 Allergy status to penicillin: Secondary | ICD-10-CM | POA: Diagnosis not present

## 2023-01-28 DIAGNOSIS — I2510 Atherosclerotic heart disease of native coronary artery without angina pectoris: Secondary | ICD-10-CM | POA: Diagnosis present

## 2023-01-28 DIAGNOSIS — Z7984 Long term (current) use of oral hypoglycemic drugs: Secondary | ICD-10-CM

## 2023-01-28 DIAGNOSIS — E1140 Type 2 diabetes mellitus with diabetic neuropathy, unspecified: Secondary | ICD-10-CM

## 2023-01-28 DIAGNOSIS — E1165 Type 2 diabetes mellitus with hyperglycemia: Secondary | ICD-10-CM | POA: Insufficient documentation

## 2023-01-28 DIAGNOSIS — K567 Ileus, unspecified: Secondary | ICD-10-CM | POA: Diagnosis not present

## 2023-01-28 DIAGNOSIS — F1290 Cannabis use, unspecified, uncomplicated: Secondary | ICD-10-CM | POA: Insufficient documentation

## 2023-01-28 DIAGNOSIS — Z87891 Personal history of nicotine dependence: Secondary | ICD-10-CM | POA: Insufficient documentation

## 2023-01-28 DIAGNOSIS — Z794 Long term (current) use of insulin: Secondary | ICD-10-CM

## 2023-01-28 DIAGNOSIS — I252 Old myocardial infarction: Secondary | ICD-10-CM

## 2023-01-28 DIAGNOSIS — Z8249 Family history of ischemic heart disease and other diseases of the circulatory system: Secondary | ICD-10-CM | POA: Diagnosis not present

## 2023-01-28 DIAGNOSIS — R599 Enlarged lymph nodes, unspecified: Secondary | ICD-10-CM | POA: Insufficient documentation

## 2023-01-28 DIAGNOSIS — Z888 Allergy status to other drugs, medicaments and biological substances status: Secondary | ICD-10-CM

## 2023-01-28 DIAGNOSIS — N2889 Other specified disorders of kidney and ureter: Secondary | ICD-10-CM | POA: Diagnosis not present

## 2023-01-28 DIAGNOSIS — G8918 Other acute postprocedural pain: Secondary | ICD-10-CM | POA: Diagnosis not present

## 2023-01-28 DIAGNOSIS — K689 Other disorders of retroperitoneum: Secondary | ICD-10-CM | POA: Diagnosis not present

## 2023-01-28 DIAGNOSIS — Z8719 Personal history of other diseases of the digestive system: Secondary | ICD-10-CM

## 2023-01-28 DIAGNOSIS — M545 Low back pain, unspecified: Secondary | ICD-10-CM | POA: Diagnosis present

## 2023-01-28 DIAGNOSIS — N179 Acute kidney failure, unspecified: Secondary | ICD-10-CM

## 2023-01-28 DIAGNOSIS — R1909 Other intra-abdominal and pelvic swelling, mass and lump: Secondary | ICD-10-CM | POA: Diagnosis present

## 2023-01-28 DIAGNOSIS — Z833 Family history of diabetes mellitus: Secondary | ICD-10-CM

## 2023-01-28 DIAGNOSIS — N400 Enlarged prostate without lower urinary tract symptoms: Secondary | ICD-10-CM | POA: Diagnosis not present

## 2023-01-28 DIAGNOSIS — N1330 Unspecified hydronephrosis: Secondary | ICD-10-CM | POA: Diagnosis not present

## 2023-01-28 DIAGNOSIS — Z79899 Other long term (current) drug therapy: Secondary | ICD-10-CM

## 2023-01-28 HISTORY — PX: CYSTOSCOPY: SHX5120

## 2023-01-28 HISTORY — PX: XI ROBOTIC ASSISTED LOWER ANTERIOR RESECTION: SHX6558

## 2023-01-28 LAB — CBC WITH DIFFERENTIAL/PLATELET
Abs Immature Granulocytes: 0.03 10*3/uL (ref 0.00–0.07)
Basophils Absolute: 0 10*3/uL (ref 0.0–0.1)
Basophils Relative: 0 %
Eosinophils Absolute: 0 10*3/uL (ref 0.0–0.5)
Eosinophils Relative: 0 %
HCT: 41.5 % (ref 39.0–52.0)
Hemoglobin: 13.8 g/dL (ref 13.0–17.0)
Immature Granulocytes: 0 %
Lymphocytes Relative: 7 %
Lymphs Abs: 0.6 10*3/uL — ABNORMAL LOW (ref 0.7–4.0)
MCH: 29.9 pg (ref 26.0–34.0)
MCHC: 33.3 g/dL (ref 30.0–36.0)
MCV: 90 fL (ref 80.0–100.0)
Monocytes Absolute: 0.1 10*3/uL (ref 0.1–1.0)
Monocytes Relative: 1 %
Neutro Abs: 7.4 10*3/uL (ref 1.7–7.7)
Neutrophils Relative %: 92 %
Platelets: 169 10*3/uL (ref 150–400)
RBC: 4.61 MIL/uL (ref 4.22–5.81)
RDW: 13.7 % (ref 11.5–15.5)
WBC: 8.1 10*3/uL (ref 4.0–10.5)
nRBC: 0 % (ref 0.0–0.2)

## 2023-01-28 LAB — GLUCOSE, CAPILLARY
Glucose-Capillary: 124 mg/dL — ABNORMAL HIGH (ref 70–99)
Glucose-Capillary: 260 mg/dL — ABNORMAL HIGH (ref 70–99)

## 2023-01-28 LAB — LACTIC ACID, PLASMA
Lactic Acid, Venous: 2 mmol/L (ref 0.5–1.9)
Lactic Acid, Venous: 2.7 mmol/L (ref 0.5–1.9)

## 2023-01-28 LAB — BASIC METABOLIC PANEL
Anion gap: 13 (ref 5–15)
BUN: 16 mg/dL (ref 6–20)
CO2: 23 mmol/L (ref 22–32)
Calcium: 9.9 mg/dL (ref 8.9–10.3)
Chloride: 101 mmol/L (ref 98–111)
Creatinine, Ser: 1.31 mg/dL — ABNORMAL HIGH (ref 0.61–1.24)
GFR, Estimated: >60 mL/min (ref >60)
Glucose, Bld: 183 mg/dL — ABNORMAL HIGH (ref 70–99)
Potassium: 4.8 mmol/L (ref 3.5–5.1)
Sodium: 137 mmol/L (ref 135–145)

## 2023-01-28 LAB — LIPASE, BLOOD: Lipase: 44 U/L (ref 11–51)

## 2023-01-28 SURGERY — RESECTION, RECTUM, LOW ANTERIOR, ROBOT-ASSISTED
Anesthesia: General | Site: Ureter

## 2023-01-28 MED ORDER — LACTATED RINGERS IV BOLUS
1000.0000 mL | Freq: Once | INTRAVENOUS | Status: AC
  Administered 2023-01-28: 1000 mL via INTRAVENOUS

## 2023-01-28 MED ORDER — EZETIMIBE 10 MG PO TABS
10.0000 mg | ORAL_TABLET | Freq: Every day | ORAL | Status: DC
  Administered 2023-01-29 – 2023-02-01 (×4): 10 mg via ORAL
  Filled 2023-01-28 (×4): qty 1

## 2023-01-28 MED ORDER — AMLODIPINE BESYLATE 10 MG PO TABS
10.0000 mg | ORAL_TABLET | Freq: Every day | ORAL | Status: DC
  Administered 2023-01-29 – 2023-02-01 (×4): 10 mg via ORAL
  Filled 2023-01-28 (×4): qty 1

## 2023-01-28 MED ORDER — GABAPENTIN 300 MG PO CAPS
ORAL_CAPSULE | ORAL | Status: AC
  Filled 2023-01-28: qty 1

## 2023-01-28 MED ORDER — HYDROMORPHONE HCL 1 MG/ML IJ SOLN
INTRAMUSCULAR | Status: AC
  Filled 2023-01-28: qty 1

## 2023-01-28 MED ORDER — ONDANSETRON HCL 4 MG/2ML IJ SOLN
INTRAMUSCULAR | Status: DC | PRN
  Administered 2023-01-28: 4 mg via INTRAVENOUS

## 2023-01-28 MED ORDER — OXYCODONE HCL 5 MG PO TABS
ORAL_TABLET | ORAL | Status: AC
  Filled 2023-01-28: qty 1

## 2023-01-28 MED ORDER — MIDAZOLAM HCL 2 MG/2ML IJ SOLN
INTRAMUSCULAR | Status: AC
  Filled 2023-01-28: qty 2

## 2023-01-28 MED ORDER — BUPIVACAINE LIPOSOME 1.3 % IJ SUSP
INTRAMUSCULAR | Status: AC
  Filled 2023-01-28: qty 20

## 2023-01-28 MED ORDER — ACETAMINOPHEN 500 MG PO TABS
1000.0000 mg | ORAL_TABLET | ORAL | Status: AC
  Administered 2023-01-28: 1000 mg via ORAL

## 2023-01-28 MED ORDER — DEXMEDETOMIDINE HCL IN NACL 80 MCG/20ML IV SOLN
INTRAVENOUS | Status: DC | PRN
Start: 2023-01-28 — End: 2023-01-28
  Administered 2023-01-28: 12 ug via INTRAVENOUS

## 2023-01-28 MED ORDER — ASPIRIN 81 MG PO TBEC
81.0000 mg | DELAYED_RELEASE_TABLET | Freq: Every day | ORAL | Status: DC
  Administered 2023-01-29 – 2023-02-01 (×4): 81 mg via ORAL
  Filled 2023-01-28 (×4): qty 1

## 2023-01-28 MED ORDER — NITROGLYCERIN 0.4 MG SL SUBL: 0.4000 mg | SUBLINGUAL_TABLET | SUBLINGUAL | Status: DC | PRN

## 2023-01-28 MED ORDER — ENOXAPARIN SODIUM 60 MG/0.6ML IJ SOSY
55.0000 mg | PREFILLED_SYRINGE | INTRAMUSCULAR | Status: DC
  Administered 2023-01-29 – 2023-02-01 (×4): 55 mg via SUBCUTANEOUS
  Filled 2023-01-28 (×4): qty 0.6

## 2023-01-28 MED ORDER — QUETIAPINE FUMARATE 25 MG PO TABS
50.0000 mg | ORAL_TABLET | Freq: Every day | ORAL | Status: DC
  Administered 2023-01-29 – 2023-01-30 (×2): 50 mg via ORAL
  Filled 2023-01-28 (×2): qty 2

## 2023-01-28 MED ORDER — LACTATED RINGERS IV SOLN: INTRAVENOUS | Status: DC | PRN

## 2023-01-28 MED ORDER — KETAMINE HCL 10 MG/ML IJ SOLN
INTRAMUSCULAR | Status: DC | PRN
  Administered 2023-01-28: 50 mg via INTRAVENOUS

## 2023-01-28 MED ORDER — BUPIVACAINE-EPINEPHRINE 0.25% -1:200000 IJ SOLN
INTRAMUSCULAR | Status: DC | PRN
  Administered 2023-01-28: 30 mL

## 2023-01-28 MED ORDER — PROPOFOL 10 MG/ML IV BOLUS
INTRAVENOUS | Status: DC | PRN
  Administered 2023-01-28: 200 mg via INTRAVENOUS

## 2023-01-28 MED ORDER — ACETAMINOPHEN 500 MG PO TABS
ORAL_TABLET | ORAL | Status: AC
  Filled 2023-01-28: qty 2

## 2023-01-28 MED ORDER — ROCURONIUM BROMIDE 100 MG/10ML IV SOLN
INTRAVENOUS | Status: DC | PRN
  Administered 2023-01-28: 30 mg via INTRAVENOUS
  Administered 2023-01-28: 20 mg via INTRAVENOUS
  Administered 2023-01-28: 70 mg via INTRAVENOUS
  Administered 2023-01-28: 20 mg via INTRAVENOUS

## 2023-01-28 MED ORDER — LOSARTAN POTASSIUM 50 MG PO TABS: 25.0000 mg | ORAL_TABLET | Freq: Every day | ORAL | Status: DC

## 2023-01-28 MED ORDER — TAMSULOSIN HCL 0.4 MG PO CAPS
0.4000 mg | ORAL_CAPSULE | Freq: Every day | ORAL | Status: DC
  Administered 2023-01-29 – 2023-02-01 (×4): 0.4 mg via ORAL
  Filled 2023-01-28 (×4): qty 1

## 2023-01-28 MED ORDER — PROPOFOL 10 MG/ML IV BOLUS
INTRAVENOUS | Status: AC
  Filled 2023-01-28: qty 20

## 2023-01-28 MED ORDER — HYDROMORPHONE HCL 1 MG/ML IJ SOLN
1.0000 mg | Freq: Once | INTRAMUSCULAR | Status: AC
  Administered 2023-01-28: 1 mg via INTRAVENOUS
  Filled 2023-01-28: qty 1

## 2023-01-28 MED ORDER — MIDAZOLAM HCL 2 MG/2ML IJ SOLN
INTRAMUSCULAR | Status: DC | PRN
  Administered 2023-01-28 (×2): 2 mg via INTRAVENOUS

## 2023-01-28 MED ORDER — INDOCYANINE GREEN 25 MG IV SOLR
INTRAVENOUS | Status: DC | PRN
  Administered 2023-01-28: 25 mg

## 2023-01-28 MED ORDER — GABAPENTIN 300 MG PO CAPS
600.0000 mg | ORAL_CAPSULE | Freq: Three times a day (TID) | ORAL | Status: DC
  Administered 2023-01-29 – 2023-01-31 (×5): 600 mg via ORAL
  Filled 2023-01-28 (×8): qty 2

## 2023-01-28 MED ORDER — BUPIVACAINE-EPINEPHRINE (PF) 0.25% -1:200000 IJ SOLN
INTRAMUSCULAR | Status: AC
  Filled 2023-01-28: qty 30

## 2023-01-28 MED ORDER — FENTANYL CITRATE (PF) 100 MCG/2ML IJ SOLN
INTRAMUSCULAR | Status: AC
  Filled 2023-01-28: qty 2

## 2023-01-28 MED ORDER — SODIUM CHLORIDE 0.9 % IV SOLN: INTRAVENOUS | Status: DC

## 2023-01-28 MED ORDER — LINAGLIPTIN 5 MG PO TABS
5.0000 mg | ORAL_TABLET | Freq: Every day | ORAL | Status: DC
  Administered 2023-01-29 – 2023-02-01 (×3): 5 mg via ORAL
  Filled 2023-01-28 (×4): qty 1

## 2023-01-28 MED ORDER — FENTANYL CITRATE (PF) 100 MCG/2ML IJ SOLN
INTRAMUSCULAR | Status: DC | PRN
  Administered 2023-01-28: 100 ug via INTRAVENOUS

## 2023-01-28 MED ORDER — BUPIVACAINE LIPOSOME 1.3 % IJ SUSP: 20.0000 mL | Freq: Once | INTRAMUSCULAR | Status: DC

## 2023-01-28 MED ORDER — PHENYLEPHRINE HCL-NACL 20-0.9 MG/250ML-% IV SOLN
INTRAVENOUS | Status: DC | PRN
  Administered 2023-01-28: 22 ug/min via INTRAVENOUS

## 2023-01-28 MED ORDER — PANTOPRAZOLE SODIUM 40 MG PO TBEC
40.0000 mg | DELAYED_RELEASE_TABLET | Freq: Every day | ORAL | Status: DC
  Administered 2023-01-29 – 2023-02-01 (×4): 40 mg via ORAL
  Filled 2023-01-28 (×4): qty 1

## 2023-01-28 MED ORDER — GLIPIZIDE ER 5 MG PO TB24
5.0000 mg | ORAL_TABLET | Freq: Every day | ORAL | Status: DC
  Filled 2023-01-28: qty 1

## 2023-01-28 MED ORDER — CHLORHEXIDINE GLUCONATE CLOTH 2 % EX PADS
6.0000 | MEDICATED_PAD | Freq: Once | CUTANEOUS | Status: AC
  Administered 2023-01-28: 6 via TOPICAL

## 2023-01-28 MED ORDER — PHENYLEPHRINE 80 MCG/ML (10ML) SYRINGE FOR IV PUSH (FOR BLOOD PRESSURE SUPPORT)
PREFILLED_SYRINGE | INTRAVENOUS | Status: AC
  Filled 2023-01-28: qty 10

## 2023-01-28 MED ORDER — ONDANSETRON HCL 4 MG/2ML IJ SOLN
4.0000 mg | Freq: Once | INTRAMUSCULAR | Status: AC
  Administered 2023-01-28: 4 mg via INTRAVENOUS
  Filled 2023-01-28: qty 2

## 2023-01-28 MED ORDER — METHOCARBAMOL 500 MG PO TABS
750.0000 mg | ORAL_TABLET | Freq: Three times a day (TID) | ORAL | Status: DC | PRN
  Administered 2023-01-31: 750 mg via ORAL
  Filled 2023-01-28: qty 2

## 2023-01-28 MED ORDER — HYDROCODONE-ACETAMINOPHEN 5-325 MG PO TABS
1.0000 | ORAL_TABLET | ORAL | Status: DC | PRN
  Administered 2023-01-29: 2 via ORAL
  Filled 2023-01-28: qty 2

## 2023-01-28 MED ORDER — EPHEDRINE 5 MG/ML INJ
INTRAVENOUS | Status: AC
  Filled 2023-01-28: qty 5

## 2023-01-28 MED ORDER — HYDROCODONE-ACETAMINOPHEN 5-325 MG PO TABS: 1.0000 | ORAL_TABLET | ORAL | 0 refills | Status: DC | PRN

## 2023-01-28 MED ORDER — SUGAMMADEX SODIUM 200 MG/2ML IV SOLN
INTRAVENOUS | Status: DC | PRN
  Administered 2023-01-28: 453.6 mg via INTRAVENOUS

## 2023-01-28 MED ORDER — HYDROMORPHONE HCL 1 MG/ML IJ SOLN
INTRAMUSCULAR | Status: DC | PRN
  Administered 2023-01-28 (×2): .2 mg via INTRAVENOUS
  Administered 2023-01-28: .4 mg via INTRAVENOUS
  Administered 2023-01-28: .2 mg via INTRAVENOUS

## 2023-01-28 MED ORDER — METOPROLOL SUCCINATE ER 25 MG PO TB24
25.0000 mg | ORAL_TABLET | Freq: Every day | ORAL | Status: DC
  Administered 2023-01-29 – 2023-02-01 (×3): 25 mg via ORAL
  Filled 2023-01-28 (×4): qty 1

## 2023-01-28 MED ORDER — ROSUVASTATIN CALCIUM 10 MG PO TABS
40.0000 mg | ORAL_TABLET | Freq: Every day | ORAL | Status: DC
  Administered 2023-01-29 – 2023-02-01 (×4): 40 mg via ORAL
  Filled 2023-01-28 (×4): qty 4

## 2023-01-28 MED ORDER — LIDOCAINE HCL (CARDIAC) PF 100 MG/5ML IV SOSY
PREFILLED_SYRINGE | INTRAVENOUS | Status: DC | PRN
  Administered 2023-01-28: 100 mg via INTRAVENOUS

## 2023-01-28 MED ORDER — FAMOTIDINE IN NACL 20-0.9 MG/50ML-% IV SOLN
20.0000 mg | Freq: Once | INTRAVENOUS | Status: AC
  Administered 2023-01-28: 20 mg via INTRAVENOUS
  Filled 2023-01-28: qty 50

## 2023-01-28 MED ORDER — ACETAMINOPHEN 325 MG PO TABS: 650.0000 mg | ORAL_TABLET | Freq: Four times a day (QID) | ORAL | Status: DC | PRN

## 2023-01-28 MED ORDER — GABAPENTIN 300 MG PO CAPS
300.0000 mg | ORAL_CAPSULE | ORAL | Status: AC
  Administered 2023-01-28: 300 mg via ORAL

## 2023-01-28 MED ORDER — CEFAZOLIN SODIUM-DEXTROSE 2-4 GM/100ML-% IV SOLN
INTRAVENOUS | Status: AC
  Filled 2023-01-28: qty 100

## 2023-01-28 MED ORDER — BUPIVACAINE HCL (PF) 0.25 % IJ SOLN
INTRAMUSCULAR | Status: AC
  Filled 2023-01-28: qty 30

## 2023-01-28 MED ORDER — ONDANSETRON HCL 4 MG PO TABS: 4.0000 mg | ORAL_TABLET | Freq: Four times a day (QID) | ORAL | Status: DC | PRN

## 2023-01-28 MED ORDER — CEFAZOLIN SODIUM-DEXTROSE 2-4 GM/100ML-% IV SOLN
2.0000 g | INTRAVENOUS | Status: AC
  Administered 2023-01-28 (×2): 2 g via INTRAVENOUS

## 2023-01-28 MED ORDER — ACETAMINOPHEN 650 MG RE SUPP: 650.0000 mg | Freq: Four times a day (QID) | RECTAL | Status: DC | PRN

## 2023-01-28 MED ORDER — KETAMINE HCL 50 MG/5ML IJ SOSY
PREFILLED_SYRINGE | INTRAMUSCULAR | Status: AC
  Filled 2023-01-28: qty 5

## 2023-01-28 MED ORDER — PHENYLEPHRINE 80 MCG/ML (10ML) SYRINGE FOR IV PUSH (FOR BLOOD PRESSURE SUPPORT)
PREFILLED_SYRINGE | INTRAVENOUS | Status: DC | PRN
  Administered 2023-01-28: 40 ug via INTRAVENOUS

## 2023-01-28 MED ORDER — CHLORHEXIDINE GLUCONATE 0.12 % MT SOLN
OROMUCOSAL | Status: AC
  Filled 2023-01-28: qty 15

## 2023-01-28 MED ORDER — 0.9 % SODIUM CHLORIDE (POUR BTL) OPTIME
TOPICAL | Status: DC | PRN
  Administered 2023-01-28: 500 mL

## 2023-01-28 MED ORDER — GLYCOPYRROLATE 0.2 MG/ML IJ SOLN
INTRAMUSCULAR | Status: DC | PRN
  Administered 2023-01-28: .1 mg via INTRAVENOUS

## 2023-01-28 MED ORDER — FENTANYL CITRATE (PF) 100 MCG/2ML IJ SOLN
25.0000 ug | INTRAMUSCULAR | Status: DC | PRN
  Administered 2023-01-28 (×3): 25 ug via INTRAVENOUS
  Administered 2023-01-28: 50 ug via INTRAVENOUS

## 2023-01-28 MED ORDER — ONDANSETRON HCL 4 MG/2ML IJ SOLN
4.0000 mg | Freq: Four times a day (QID) | INTRAMUSCULAR | Status: DC | PRN
  Administered 2023-01-29: 4 mg via INTRAVENOUS
  Filled 2023-01-28: qty 2

## 2023-01-28 MED ORDER — BUPIVACAINE LIPOSOME 1.3 % IJ SUSP
INTRAMUSCULAR | Status: DC | PRN
Start: 2023-01-28 — End: 2023-01-28
  Administered 2023-01-28: 20 mL

## 2023-01-28 MED ORDER — CHLORHEXIDINE GLUCONATE 0.12 % MT SOLN
15.0000 mL | Freq: Once | OROMUCOSAL | Status: AC
  Administered 2023-01-28: 15 mL via OROMUCOSAL

## 2023-01-28 MED ORDER — TRAZODONE HCL 50 MG PO TABS: 25.0000 mg | ORAL_TABLET | Freq: Every evening | ORAL | Status: DC | PRN

## 2023-01-28 MED ORDER — DEXAMETHASONE SODIUM PHOSPHATE 10 MG/ML IJ SOLN
INTRAMUSCULAR | Status: DC | PRN
  Administered 2023-01-28: 10 mg via INTRAVENOUS

## 2023-01-28 MED ORDER — ORAL CARE MOUTH RINSE: 15.0000 mL | Freq: Once | OROMUCOSAL | Status: AC

## 2023-01-28 MED ORDER — MAGNESIUM HYDROXIDE 400 MG/5ML PO SUSP: 30.0000 mL | Freq: Every day | ORAL | Status: DC | PRN

## 2023-01-28 MED ORDER — OXYCODONE HCL 5 MG PO TABS
5.0000 mg | ORAL_TABLET | Freq: Once | ORAL | Status: AC | PRN
  Administered 2023-01-28: 5 mg via ORAL

## 2023-01-28 MED ORDER — OXYCODONE HCL 5 MG/5ML PO SOLN: 5.0000 mg | Freq: Once | ORAL | Status: AC | PRN

## 2023-01-28 MED ORDER — SODIUM CHLORIDE (PF) 0.9 % IJ SOLN
INTRAMUSCULAR | Status: AC
  Filled 2023-01-28: qty 50

## 2023-01-28 MED ORDER — IOHEXOL 300 MG/ML  SOLN
100.0000 mL | Freq: Once | INTRAMUSCULAR | Status: AC | PRN
  Administered 2023-01-28: 100 mL via INTRAVENOUS

## 2023-01-28 MED ORDER — SODIUM CHLORIDE 0.9 % IV BOLUS
1000.0000 mL | Freq: Once | INTRAVENOUS | Status: AC
  Administered 2023-01-28: 1000 mL via INTRAVENOUS

## 2023-01-28 SURGICAL SUPPLY — 107 items
ADH SKN CLS APL DERMABOND .7 (GAUZE/BANDAGES/DRESSINGS) ×3
BAG DRAIN SIEMENS DORNER NS (MISCELLANEOUS) ×3 IMPLANT
BAG DRN NS LF (MISCELLANEOUS) ×3
BAG DRN RND TRDRP ANRFLXCHMBR (UROLOGICAL SUPPLIES) ×3
BAG LAPAROSCOPIC 12 15 PORT 16 (BASKET) IMPLANT
BAG RETRIEVAL 12/15 (BASKET)
BAG URINE DRAIN 2000ML AR STRL (UROLOGICAL SUPPLIES) ×3 IMPLANT
CANNULA REDUCER 12-8 DVNC XI (CANNULA) ×3 IMPLANT
CATH FOLEY 2WAY 5CC 16FR (CATHETERS) ×3
CATH ROBINSON RED A/P 16FR (CATHETERS) ×3 IMPLANT
CATH URETL OPEN 5X70 (CATHETERS) ×3 IMPLANT
CATH URTH 16FR FL 2W BLN LF (CATHETERS) ×3 IMPLANT
CLIP LIGATING HEMO O LOK GREEN (MISCELLANEOUS) IMPLANT
COVER MAYO STAND STRL (DRAPES) ×3 IMPLANT
COVER TIP SHEARS 8 DVNC (MISCELLANEOUS) ×3 IMPLANT
DERMABOND ADVANCED .7 DNX12 (GAUZE/BANDAGES/DRESSINGS) ×3 IMPLANT
DRAPE ARM DVNC X/XI (DISPOSABLE) ×12 IMPLANT
DRAPE COLUMN DVNC XI (DISPOSABLE) ×3 IMPLANT
DRAPE LEGGINS SURG 28X43 STRL (DRAPES) ×3 IMPLANT
DRAPE UNDER BUTTOCK W/FLU (DRAPES) ×3 IMPLANT
DRIVER NDL MEGA SUTCUT DVNCXI (INSTRUMENTS) IMPLANT
DRIVER NDLE MEGA SUTCUT DVNCXI (INSTRUMENTS) ×3
ELECT BLADE 6.5 EXT (BLADE) ×3 IMPLANT
ELECT CAUTERY BLADE 6.4 (BLADE) ×3 IMPLANT
ELECT REM PT RETURN 9FT ADLT (ELECTROSURGICAL) ×3
ELECTRODE REM PT RTRN 9FT ADLT (ELECTROSURGICAL) ×3 IMPLANT
FORCEPS BPLR R/ABLATION 8 DVNC (INSTRUMENTS) ×3 IMPLANT
GAUZE 4X4 16PLY ~~LOC~~+RFID DBL (SPONGE) ×6 IMPLANT
GLOVE BIO SURGEON STRL SZ7 (GLOVE) ×9 IMPLANT
GLOVE BIOGEL PI IND STRL 7.5 (GLOVE) ×3 IMPLANT
GOWN STRL REUS W/ TWL LRG LVL3 (GOWN DISPOSABLE) ×18 IMPLANT
GOWN STRL REUS W/TWL LRG LVL3 (GOWN DISPOSABLE) ×21
GRASPER LAPSCPC 5X45 DSP (INSTRUMENTS) ×3 IMPLANT
GRASPER TIP-UP FEN DVNC XI (INSTRUMENTS) ×3 IMPLANT
GUIDEWIRE GREEN .038 145CM (MISCELLANEOUS) IMPLANT
GUIDEWIRE STR DUAL SENSOR (WIRE) IMPLANT
HANDLE YANKAUER SUCT BULB TIP (MISCELLANEOUS) ×3 IMPLANT
IRRIGATION STRYKERFLOW (MISCELLANEOUS) ×3 IMPLANT
IRRIGATOR STRYKERFLOW (MISCELLANEOUS) ×3
IV NS 1000ML (IV SOLUTION) ×6
IV NS 1000ML BAXH (IV SOLUTION) ×3 IMPLANT
IV NS IRRIG 3000ML ARTHROMATIC (IV SOLUTION) ×3 IMPLANT
KIT IMAGING PINPOINTPAQ (MISCELLANEOUS) ×6 IMPLANT
KIT PINK PAD W/HEAD ARE REST (MISCELLANEOUS) ×3
KIT PINK PAD W/HEAD ARM REST (MISCELLANEOUS) ×3 IMPLANT
LABEL OR SOLS (LABEL) ×3 IMPLANT
MANIFOLD NEPTUNE II (INSTRUMENTS) ×6 IMPLANT
NDL DRIVE SUT CUT DVNC (INSTRUMENTS) ×3 IMPLANT
NDL HYPO 22X1.5 SAFETY MO (MISCELLANEOUS) ×3 IMPLANT
NEEDLE DRIVE SUT CUT DVNC (INSTRUMENTS) ×3
NEEDLE HYPO 22X1.5 SAFETY MO (MISCELLANEOUS) ×3
NS IRRIG 500ML POUR BTL (IV SOLUTION) ×3 IMPLANT
OBTURATOR OPTICAL STND 8 DVNC (TROCAR) ×3
OBTURATOR OPTICALSTD 8 DVNC (TROCAR) ×3 IMPLANT
PACK COLON CLEAN CLOSURE (MISCELLANEOUS) ×3 IMPLANT
PACK CYSTO AR (MISCELLANEOUS) ×3 IMPLANT
PACK LAP CHOLECYSTECTOMY (MISCELLANEOUS) ×3 IMPLANT
PAD PREP OB/GYN DISP 24X41 (PERSONAL CARE ITEMS) ×3 IMPLANT
PENCIL SMOKE EVACUATOR (MISCELLANEOUS) ×3 IMPLANT
PORT ACCESS TROCAR AIRSEAL 5 (TROCAR) ×3 IMPLANT
RELOAD STAPLE 45 3.5 BLU DVNC (STAPLE) IMPLANT
RELOAD STAPLE 60 2.5 WHT DVNC (STAPLE) IMPLANT
RELOAD STAPLE 60 3.5 BLU DVNC (STAPLE) IMPLANT
SCISSORS MNPLR CVD DVNC XI (INSTRUMENTS) ×3 IMPLANT
SEAL UNIV 5-12 XI (MISCELLANEOUS) ×9 IMPLANT
SEALER VESSEL EXT DVNC XI (MISCELLANEOUS) ×3 IMPLANT
SET CYSTO W/LG BORE CLAMP LF (SET/KITS/TRAYS/PACK) ×3 IMPLANT
SET TRI-LUMEN FLTR TB AIRSEAL (TUBING) ×3 IMPLANT
SOL ELECTROSURG ANTI STICK (MISCELLANEOUS) ×3
SOL PREP PVP 2OZ (MISCELLANEOUS)
SOLUTION ELECTROSURG ANTI STCK (MISCELLANEOUS) ×3 IMPLANT
SOLUTION PREP PVP 2OZ (MISCELLANEOUS) ×3 IMPLANT
SPIKE FLUID TRANSFER (MISCELLANEOUS) ×3 IMPLANT
SPONGE T-LAP 18X18 ~~LOC~~+RFID (SPONGE) ×9 IMPLANT
SPONGE T-LAP 4X18 ~~LOC~~+RFID (SPONGE) IMPLANT
STAPLER 45 SUREFORM DVNC (STAPLE) IMPLANT
STAPLER 60 SUREFORM DVNC (STAPLE) IMPLANT
STAPLER CIRCULAR MANUAL XL 25 (STAPLE) IMPLANT
STAPLER CIRCULAR MANUAL XL 29 (STAPLE) IMPLANT
STAPLER CIRCULAR MANUAL XL 33 (STAPLE) IMPLANT
STAPLER RELOAD 2.5X60 WHT DVNC (STAPLE)
STAPLER RELOAD 3.5X45 BLU DVNC (STAPLE)
STAPLER RELOAD 3.5X60 BLU DVNC (STAPLE)
SURGILUBE 2OZ TUBE FLIPTOP (MISCELLANEOUS) ×6 IMPLANT
SUT MNCRL AB 4-0 PS2 18 (SUTURE) ×3 IMPLANT
SUT PDS 2-0 27IN (SUTURE) ×3 IMPLANT
SUT PDS AB 0 CT1 27 (SUTURE) ×6 IMPLANT
SUT PROLENE 2 0 SH DA (SUTURE) ×3 IMPLANT
SUT SILK 2 0 (SUTURE)
SUT SILK 2 0 SH (SUTURE) ×3 IMPLANT
SUT SILK 2-0 30XBRD TIE 12 (SUTURE) ×3 IMPLANT
SUT VIC AB 2-0 SH 27 (SUTURE)
SUT VIC AB 2-0 SH 27XBRD (SUTURE) ×3 IMPLANT
SUT VICRYL 0 UR6 27IN ABS (SUTURE) ×3 IMPLANT
SUT VLOC 90 S/L VL9 GS22 (SUTURE) IMPLANT
SYR 10ML LL (SYRINGE) ×6 IMPLANT
SYR 20ML LL LF (SYRINGE) ×6 IMPLANT
SYR TOOMEY IRRIG 70ML (MISCELLANEOUS)
SYRINGE TOOMEY IRRIG 70ML (MISCELLANEOUS) ×3 IMPLANT
SYS BAG RETRIEVAL 10MM (BASKET) ×3
SYS TROCAR 1.5-3 SLV ABD GEL (ENDOMECHANICALS)
SYSTEM BAG RETRIEVAL 10MM (BASKET) IMPLANT
SYSTEM TROCR 1.5-3 SLV ABD GEL (ENDOMECHANICALS) ×3 IMPLANT
TRAP FLUID SMOKE EVACUATOR (MISCELLANEOUS) ×6 IMPLANT
TRAY FOLEY SLVR 16FR LF STAT (SET/KITS/TRAYS/PACK) ×3 IMPLANT
WATER STERILE IRR 1000ML POUR (IV SOLUTION) ×3 IMPLANT
WATER STERILE IRR 500ML POUR (IV SOLUTION) ×6 IMPLANT

## 2023-01-28 NOTE — Discharge Instructions (Addendum)
Robot-Assisted Laparoscopic Surgery Information Robot-assisted laparoscopic surgery is a type of procedure that allows a surgeon to use robotic arms to control tools and a camera. Unlike traditional (open) surgery in which a large incision is made, this type of surgery uses a few small incisions (minimally invasive surgery). Laparoscopic surgery means that the surgery is performed in the abdomen using a thin, flexible tube that has a light and a camera on the end (laparoscope). The image from the camera is shown on a monitor. This type of surgery is commonly used for: Prostate surgery. Surgery on the uterus or ovaries. Kidney surgery. Colon surgery. Hernia repair surgery. Bariatric surgery. Tell a health care provider about: Any allergies you have. All medicines you are taking, including vitamins, herbs, eye drops, creams, and over-the-counter medicines. Any problems you or family members have had with anesthetic medicines. Any blood disorders you have. Any surgeries you have had. Any medical conditions you have. Whether you are pregnant or may be pregnant. What are the risks and benefits? This type of surgery has many of the same risks as regular, open surgery. Generally, this is a safe procedure. However, problems may occur, including: Infection. Bleeding. Damage to other structures or organs. Allergic reactions to medicines, such as anesthetics. Pain, bruising, and swelling. Other side effects may depend on the type of surgery that is done. For example, prostate surgery may cause problems with urination. This type of surgery also has benefits, mostly because the incisions for this type of surgery are smaller. This may result in: A shorter hospital stay. Faster healing time. Less blood loss. Less scarring. Less pain. In some cases, this type of surgery may also take less time to do than regular, open surgery. What happens before the surgery? Before the surgery, go over the  procedures that will take place with your health care provider. Ask your health care provider: What is the goal of the surgery? Is this surgery the best treatment option? What other options do I have? Does the surgeon have experience with this type of surgery? How much experience does the surgeon have? How long will surgery take? How long will it take to recover from surgery? What are the costs, and how do they compare to other surgical choices? How should I prepare for surgery? What happens during the surgery?  There are differences between surgeries done with and surgeries done without robot assistance. These include: During open surgery, a large incision is made. This allows the surgeon to access the organ or body part that he or she is operating on. During these procedures, the surgeon holds the surgical tools in her or his hands. During a laparoscopic procedure, a few small incisions are made. A camera is inserted so the surgeon can see the body part. The surgeon holds the tools in her or his hands to reach the body part through the smaller incisions. During a robot-assisted laparoscopic procedure, a few small incisions are made. The surgeon sits at a console in the operating room and uses the controls at the console to move the robotic arms. The surgical tools and laparoscope are attached to the ends of the robotic arms. Ask your health care professional about any concerns you may have. What can I expect after the surgery? After your procedure, it is common to have: Mild discomfort in the abdomen. Soreness in the incision areas. Follow these instructions at home: Rest as told by your health care provider. Avoid sitting for a long time without moving. Get up  to take short walks every 1-2 hours. This is important to improve blood flow and breathing. Ask for help if you feel weak or unsteady. Drink enough fluid to keep your urine pale yellow. Do not use any products that contain nicotine or  tobacco. These products include cigarettes, chewing tobacco, and vaping devices, such as e-cigarettes. These can delay healing after surgery. If you need help quitting, ask your health care provider. Keep all follow-up visits. This is important. Summary Robot-assisted laparoscopic surgery is a procedure that allows the surgeon to use robotic arms to control surgical tools and a camera. Unlike traditional (open) surgery, which involves a large incision, this type of surgery uses a few small incisions. This type of surgery has many of the same risks as regular, open surgery. This surgery may result in a shorter hospital stay, a faster recovery time, and less bleeding, pain, and scarring compared to open surgery. This information is not intended to replace advice given to you by your health care provider. Make sure you discuss any questions you have with your health care provider. Document Revised: 01/19/2020 Document Reviewed: 01/19/2020 Elsevier Patient Education  2024 [employer]  Diagnostic Laparoscopy, Care After The following information offers guidance on how to care for yourself after your procedure. Your health care provider may also give you more specific instructions. If you have problems or questions, contact your health care provider. What can I expect after the procedure? After the procedure, it is common to have: Mild discomfort in the abdomen. Sore throat. Women who have laparoscopy with a pelvic examination may have mild cramping and fluid coming from the vagina for a few days after the procedure. Follow these instructions at home: Medicines Take over-the-counter and prescription medicines only as told by your health care provider. If you were prescribed an antibiotic medicine, take it as told by your health care provider. Do not stop taking the antibiotic even if you start to feel better. Ask your health care provider if the medicine prescribed to you: Requires you to avoid  driving or using machinery. Can cause constipation. You may need to take these actions to prevent or treat constipation: Drink enough fluid to keep your urine pale yellow. Take over-the-counter or prescription medicines. Eat foods that are high in fiber, such as beans, whole grains, and fresh fruits and vegetables. Limit foods that are high in fat and processed sugars, such as fried or sweet foods. Incision care  Follow instructions from your health care provider about how to take care of your incisions. Make sure you: Wash your hands with soap and water for at least 20 seconds before and after you change your bandage (dressing). If soap and water are not available, use hand sanitizer. Change your dressing as told by your health care provider. Leave stitches (sutures), skin glue, or surgical tape in place. These skin closures may need to stay in place for 2 weeks or longer. If surgical tape edges start to loosen and curl up, you may trim the loose edges. Do not remove the surgical tape completely unless your health care provider tells you to do that. Check your incision areas every day for signs of infection. Check for: Redness, swelling, or pain. Fluid or blood. Warmth. Pus or a bad smell. Activity Return to your normal activities as told by your health care provider. Ask your health care provider what activities are safe for you. Do not lift anything that is heavier than 10 lb (4.5 kg), or the limit that  you are told, until your health care provider says that it is safe. Avoid sitting for a long time without moving. Get up to take short walks every 1-2 hours. This is important to improve blood flow and breathing. Ask for help if you feel weak or unsteady. General instructions Do not use any products that contain nicotine or tobacco. These products include cigarettes, chewing tobacco, and vaping devices, such as e-cigarettes. If you need help quitting, ask your health care provider. If you  were given a sedative during the procedure, it can affect you for several hours. Do not drive or operate machinery until your health care provider says that it is safe. Do not take baths, swim, or use a hot tub until your health care provider approves. Ask your health care provider if you may take showers. You may only be allowed to take sponge baths. Keep all follow-up visits. This is important. Contact a health care provider if: You develop shoulder pain. You feel light-headed or faint. You are unable to pass gas or have a bowel movement. You feel nauseous or you vomit. You develop a rash. You have any of these signs of infection: Redness, swelling, or pain around an incision. Fluid or blood coming from an incision. Warmth coming from an incision. Pus or a bad smell coming from an incision. A fever or chills. Get help right away if: You have severe pain. You have vomiting that does not go away. You have heavy bleeding from the vagina. Any incision opens up. You have trouble breathing. You have chest pain. These symptoms may represent a serious problem that is an emergency. Do not wait to see if the symptoms will go away. Get medical help right away. Call your local emergency services (911 in the [place] Do not drive yourself to the hospital. Summary After the procedure, it is common to have mild discomfort in the abdomen and a sore throat. Check your incision areas every day for signs of infection. Return to your normal activities as told by your health care provider. Ask your health care provider what activities are safe for you. This information is not intended to replace advice given to you by your health care provider. Make sure you discuss any questions you have with your health care provider.   AMBULATORY SURGERY  DISCHARGE INSTRUCTIONS   The drugs that you were given will stay in your system until tomorrow so for the next 24 hours you should not:  Drive an automobile Make  any legal decisions Drink any alcoholic beverage   You may resume regular meals tomorrow.  Today it is better to start with liquids and gradually work up to solid foods.  You may eat anything you prefer, but it is better to start with liquids, then soup and crackers, and gradually work up to solid foods.   Please notify your doctor immediately if you have any unusual bleeding, trouble breathing, redness and pain at the surgery site, drainage, fever, or pain not relieved by medication.  Additional Instructions:  leave green armband on for 4 days

## 2023-01-28 NOTE — ED Notes (Addendum)
ED Provider at bedside. 

## 2023-01-28 NOTE — ED Notes (Signed)
Pt and family upset they are in the hall and would like to speak with charge RN. Consulting civil engineer notified. Pt also states his nausea is back and would like something else for nausea. MD notified.

## 2023-01-28 NOTE — Anesthesia Preprocedure Evaluation (Signed)
Anesthesia Evaluation  Patient identified by MRN, date of birth, ID band Patient awake    Reviewed: Allergy & Precautions, NPO status , Patient's Chart, lab work & pertinent test results  Airway Mallampati: II  TM Distance: >3 FB     Dental  (+) Dental Advidsory Given, Teeth Intact   Pulmonary sleep apnea , former smoker   Pulmonary exam normal        Cardiovascular hypertension, (-) angina + CAD, + Past MI and + Cardiac Stents  Normal cardiovascular exam     Neuro/Psych  PSYCHIATRIC DISORDERS       Neuromuscular disease    GI/Hepatic Neg liver ROS,GERD  Medicated and Controlled,,  Endo/Other  diabetes    Renal/GU      Musculoskeletal   Abdominal   Peds  Hematology negative hematology ROS (+)   Anesthesia Other Findings Past Medical History: No date: ADHD (attention deficit hyperactivity disorder) No date: Allergy No date: Arthritis No date: CAD (coronary artery disease)     Comment:  S/P cath May 2012 with BMS to the LAD following abnormal              stress test No date: Diabetes mellitus No date: Drug abuse (HCC)     Comment:  history of marijuana use and alcohol No date: GERD (gastroesophageal reflux disease) No date: Hypercholesterolemia No date: Hypertension No date: Myocardial infarction (HCC) No date: Obesity No date: Oxygen deficiency No date: Sleep apnea  Reproductive/Obstetrics                             Anesthesia Physical Anesthesia Plan  ASA: 3  Anesthesia Plan: General ETT and General   Post-op Pain Management: Dilaudid IV   Induction: Intravenous  PONV Risk Score and Plan: 3 and Ondansetron, Dexamethasone and Midazolam  Airway Management Planned: Oral ETT  Additional Equipment:   Intra-op Plan:   Post-operative Plan: Extubation in OR  Informed Consent: I have reviewed the patients History and Physical, chart, labs and discussed the procedure  including the risks, benefits and alternatives for the proposed anesthesia with the patient or authorized representative who has indicated his/her understanding and acceptance.     Dental Advisory Given  Plan Discussed with: Anesthesiologist, CRNA and Surgeon  Anesthesia Plan Comments: (Patient consented for risks of anesthesia including but not limited to:  - adverse reactions to medications - damage to eyes, teeth, lips or other oral mucosa - nerve damage due to positioning  - sore throat or hoarseness - Damage to heart, brain, nerves, lungs, other parts of body or loss of life  Patient voiced understanding.)       Anesthesia Quick Evaluation

## 2023-01-28 NOTE — ED Provider Notes (Signed)
[facility] Provider Note    Event Date/Time   First MD Initiated Contact with Patient 01/28/23 1920     (approximate)   History   Post-op Problem   HPI  [name] [name] is a 47 y.o. 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  The patient states that he has a metallic taste in his mouth similar to what he has had with certain medications.  He reports diffuse abdominal pain and tightness.  He states he has not had a bowel movement since the surgery.  He did urinate once.  I reviewed the past medical records including the op notes from Dr. Signa Kell from urology and Dr. Elenor Legato from general surgery from today.  The patient had a biopsy of the mass and cystoscopy and right ureteral catheterization with no complications.  The patient was most recently seen in the ED on 6/19 with vomiting, diarrhea, abdominal pain.  At that time the CT revealed the mass but otherwise showed no acute findings to explain his symptoms.   Physical Exam   Triage Vital Signs: ED Triage Vitals  Encounter Vitals Group     BP 01/28/23 1916 127/85     Systolic BP Percentile --      Diastolic BP Percentile --      Pulse Rate 01/28/23 1916 74     Resp 01/28/23 1916 13     Temp 01/28/23 1918 98.4 F (36.9 C)     Temp Source 01/28/23 1918 Oral     SpO2 01/28/23 1916 100 %     Weight --      Height --      Head Circumference --      Peak Flow --      Pain Score 01/28/23 1914 7     Pain Loc --      Pain Education --      Exclude from Growth Chart --     Most recent vital signs: Vitals:   01/28/23 2129 01/28/23 2310  BP: 133/75 116/73  Pulse: 79 84  Resp: 16 16  Temp: 99.1 F (37.3 C)   SpO2: 96% 96%     General: Alert, uncomfortable  appearing, no distress.  CV:  Good peripheral perfusion.  Resp:  Normal effort.  Abd:  Soft with mild diffuse tenderness.  No distention.  Other:  No jaundice or scleral icterus.   ED Results / Procedures / Treatments   Labs (all labs ordered are listed, but only abnormal results are displayed) Labs Reviewed  BASIC METABOLIC PANEL - Abnormal; Notable for the following components:      Result Value   Glucose, Bld 183 (*)    Creatinine, Ser 1.31 (*)    All other components within normal limits  CBC WITH DIFFERENTIAL/PLATELET - Abnormal; Notable for the following components:   Lymphs Abs 0.6 (*)    All other components within normal limits  LACTIC ACID, PLASMA - Abnormal; Notable for the following components:   Lactic Acid, Venous 2.0 (*)    All other components within normal limits  LACTIC ACID, PLASMA - Abnormal; Notable for the following components:   Lactic Acid, Venous 2.7 (*)    All other components within normal limits  LIPASE, BLOOD  URINALYSIS, ROUTINE W REFLEX MICROSCOPIC     EKG  RADIOLOGY  CT abdomen/pelvis: I independently viewed and interpreted the images; no free air or free fluid.  Radiology report indicates dilated bowel loops concerning for partial small bowel obstruction.  There is also air in the renal collecting system.   PROCEDURES:  Critical Care performed: No  Procedures   MEDICATIONS ORDERED IN ED: Medications  lactated ringers bolus 1,000 mL (0 mLs Intravenous Stopped 01/28/23 2205)  ondansetron (ZOFRAN) injection 4 mg (4 mg Intravenous Given 01/28/23 2006)  HYDROmorphone (DILAUDID) injection 1 mg (1 mg Intravenous Given 01/28/23 2006)  famotidine (PEPCID) IVPB 20 mg premix (0 mg Intravenous Stopped 01/28/23 2036)  iohexol (OMNIPAQUE) 300 MG/ML solution 100 mL (100 mLs Intravenous Contrast Given 01/28/23 2021)  HYDROmorphone (DILAUDID) injection 1 mg (1 mg Intravenous Given 01/28/23 2121)  ondansetron (ZOFRAN) injection 4 mg (4 mg  Intravenous Given 01/28/23 2232)  sodium chloride 0.9 % bolus 1,000 mL (1,000 mLs Intravenous New Bag/Given 01/28/23 2307)     IMPRESSION / MDM / ASSESSMENT AND PLAN / ED COURSE  I reviewed the triage vital signs and the nursing notes.  47 year old male with PMH as noted above presents with nausea, vomiting, and abdominal pain after a biopsy of a retroperitoneal mass earlier today.  He states that the symptoms are similar to what he was seen in the ED for previously.  Differential diagnosis includes, but is not limited to, complication related to the procedure such as a perforation or hematoma, versus exacerbation of chronic GI symptoms or a other acute GI etiology such as GERD, gastritis, PUD, gastroparesis related to his diabetes, pancreatitis, cyclical vomiting, IBS.  We will obtain CT to rule out postsurgical complication, lab workup, give fluids, analgesia, antiemetics, and reassess.  Patient's presentation is most consistent with acute presentation with potential threat to life or bodily function.  ----------------------------------------- 11:18 PM on 01/28/2023 -----------------------------------------  Lab workup reveals elevated lactate which is consistent with dehydration and vomiting.  There is no evidence of sepsis.  CBC shows no leukocytosis.  Electrolytes are unremarkable.  LFTs and lipase are normal.  CT shows findings concerning for small bowel obstruction.  I consulted Dr. Aleen Campi from general surgery who recommends placing an NG tube, continuing IV hydration, and admitting the patient to the hospitalist service.  I counseled the patient on the results of the workup and plan of care and he is in agreement.  I also consulted and discussed the case with Dr. Richardo Hanks from urology given the findings on the CT and the urinary system.  He advises that the air in the renal collecting system seen on CT is normal after the procedure patient had earlier and he does not need further  workup.  I then consulted Dr. Arville Care from the hospitalist service; based on our discussion he agrees to evaluate the patient for admission.   FINAL CLINICAL IMPRESSION(S) / ED DIAGNOSES   Final diagnoses:  Small bowel obstruction (HCC)  Post-operative pain     Rx / DC Orders   ED Discharge Orders     None        Note:  This document was prepared using Dragon voice recognition software and may include unintentional dictation errors.    Dionne Bucy, MD 01/28/23 [phone]

## 2023-01-28 NOTE — H&P (Signed)
   01/28/23 7:34 AM   [name] [name] [birth year] [id number]  CC: Right retroperitoneal mass  HPI: 47 year old male found to have a 3.5 cm hypermetabolic soft tissue lesion along the aortic bifurcation surrounding the right ureter causing mild to moderate right hydronephrosis.  Planning to undergo resection with general surgery today.  Urology was requested for right ureteral ICG.   PMH: Past Medical History:  Diagnosis Date   ADHD (attention deficit hyperactivity disorder)    Allergy    Arthritis    CAD (coronary artery disease)    S/P cath May 2012 with BMS to the LAD following abnormal stress test   DDD (degenerative disc disease), lumbar    Diabetes mellitus    Drug abuse (HCC)    history of marijuana use and alcohol   GERD (gastroesophageal reflux disease)    History of GI bleed    HNP (herniated nucleus pulposus), lumbar    Hypercholesterolemia    Hypertension    Myocardial infarction [facility])    Obesity    Oxygen deficiency    Retroperitoneal mass 01/2023   Sleep apnea     Surgical History: Past Surgical History:  Procedure Laterality Date   ABSCESS DRAINAGE Right 2003   right arm   COLONOSCOPY WITH PROPOFOL N/A 10/10/2020   Procedure: COLONOSCOPY WITH PROPOFOL;  Surgeon: [name], MD;  Location: [facility] ENDOSCOPY;  Service: Gastroenterology;  Laterality: N/A;   CORONARY STENT PLACEMENT  10/2010   LAD     Family History: Family History  Problem Relation Age of Onset   Hypertension Mother    Diabetes Mother    Breast cancer Mother    Multiple myeloma Mother    Hypertension Father    Diabetes Father    Heart disease Father        cabg 3; 55; 62 stents   Hypertension Sister    Heart disease Paternal Uncle    Heart disease Paternal Grandmother     Social History:  reports that he quit smoking about 5 years ago. His smoking use included cigarettes. He started smoking about 19 years ago. He has a 14 pack-year smoking history. He has been exposed to  tobacco smoke. He has never used smokeless tobacco. He reports that he does not currently use alcohol after a past usage of about 10.0 standard drinks of alcohol per week. He reports current drug use. Frequency: 20.00 times per week. Drug: Marijuana.  Physical Exam:  Constitutional:  Alert and oriented, No acute distress. Cardiovascular: Regular rate and rhythm Respiratory: Clear to auscultation bilaterally GI: Abdomen is soft, nontender, nondistended, no abdominal masses  Assessment & Plan:   47 year old male with 3.5 cm metabolically active right retroperitoneal mass, planning to undergo robotic resection with general surgery today.  Urology requested to place right ureteral ICG.  We discussed the risks of bleeding, infection, ureteral injury, potential need for ureteral repair, and ureteral stent.  We also discussed stent related symptoms including urgency, frequency, flank pain, dysuria, and gross hematuria.  Cystoscopy and right ureteral ICG instillation, robotic retroperitoneal mass excision by general surgery today  Legrand Rams, MD 01/28/2023  [facility] [address] [place], [place] [address] [phone]

## 2023-01-28 NOTE — ED Notes (Signed)
Agricultural consultant at bedside.

## 2023-01-28 NOTE — ED Triage Notes (Signed)
Pt to ED via [facility] c.o post op problem. Pt was discharged from same day surgery around 4:30pm, pt had laparoscopic biopsy done. Pt endorses abd pain, nausea, and weakness. Denies CP, SOB, fevers, dizziness    20 RAC 4mg  zofran by EMS

## 2023-01-28 NOTE — Transfer of Care (Signed)
Immediate Anesthesia Transfer of Care Note  Patient: [name] [name]  Procedure(s) Performed: XI ROBOTIC ASSISTED RETROPERITONEAL MASS EXCISION (Perineum) INDOCYANINE GREEN FLUORESCENCE IMAGING (ICG) (Bilateral) CYSTOSCOPY (Ureter)  Patient Location: PACU  Anesthesia Type:General  Level of Consciousness: alert  and oriented  Airway & Oxygen Therapy: Patient Spontanous Breathing and Patient connected to face mask oxygen  Post-op Assessment: Report given to RN and Post -op Vital signs reviewed and stable  Post vital signs: Reviewed and stable  Last Vitals:  Vitals Value Taken Time  BP 153/95 01/28/23 1245  Temp    Pulse 106 01/28/23 1249  Resp    SpO2 100 % 01/28/23 1249  Vitals shown include unfiled device data.  Last Pain:  Vitals:   01/28/23 0739  TempSrc: Temporal  PainSc: 6          Complications: No notable events documented.

## 2023-01-28 NOTE — Op Note (Addendum)
Robotic assisted laparoscopic excision retroperitoneal mass 4 cms  Pre-operative Diagnosis: retroperitoneal mass  Post-operative Diagnosis: same  Surgeon: Sterling Big, MD FACS  Anesthesia: Gen. with endotracheal tube  Findings: Retroperitoneal mass causing right hydroureter, encasing both left and right common iliac arteries.  Estimated Blood Loss: 10cc       Specimens: retroperitoneal mass sent for lymphoma w/u       Complications: none   Procedure Details  The patient was seen again in the Holding Room. The benefits, complications, treatment options, and expected outcomes were discussed with the patient. The risks of bleeding, infection, recurrence of symptoms, failure to resolve symptoms, bowel or vascular damage, ,bowel injury, any of which could require further surgery were reviewed with the patient. The likelihood of improving the patient's symptoms with return to their baseline status is good.  The patient and/or family concurred with the proposed plan, giving informed consent.  The patient was taken to Operating Room, identified  and the procedure verified . A Time Out was held and the above information confirmed.  Prior to the induction of general anesthesia, antibiotic prophylaxis was administered. VTE prophylaxis was in place. General endotracheal anesthesia was then administered and tolerated well. After the induction, the abdomen was prepped with Chloraprep and draped in the sterile fashion.  Dr. Richardo Hanks did a cystoscopy and placed ICG into the right ureter , please see his op note for further details.   The patient was positioned in the supine position. Veres needle inserted into palmer's point, saline drop test was appropriate. Pneumoperitoneum was then created with CO2 and tolerated well without any adverse changes in the patient's vital signs.  Four 8-mm ports were placed under direct vision.   The patient was positioned  in  Trendelenburg, robot was brought to the  surgical field and docked in the standard fashion.  We made sure all the instrumentation was kept indirect view at all times and that there were no collision between the arms. I scrubbed out and went to the console.  I was able to retract his small bowel and identify the bifurcation of the aorta.  I incised the peritoneum overlying the aorta.  I also identified the right future as well as the right and right external iliac arteries.  I was able to follow the right uterine ureter from the bladder all the way into the retroperitoneum.  By scoring the retroperitoneal I was able to safely identified purser the right iliac artery as well as the right external and common iliacs and veins. Particulars dissection was carried in order to avoid and prevent any injuries to the vascular structures.  There was obvious hydro ureter on the right side.  I was able to dissect all the way down to the distal end of the abdominal aorta.  He noticed that the tumor was encasing the bifurcation specifically at the right common iliac artery.  We were able to peel off the tumor from vascular structures and I was extremely careful with the surrounding anatomical vessels.  I do feel that there is a degree of displacement of the right common iliac artery due to the tumor.  I removed the tumor to the best of my abilities but also being cognizant to avoid any major injuries. We had to take multiple pieces of the specimen in order to achieve a safe resection. The specimen was placed into a bag.  Hemostasis was achieved with the electrocautery. Inspection of the lower quadrant  and pelvis was performed. No bleeding, bowel  or ureter leaks, or bowel injury was noted. Robotic instruments and robotic arms were undocked in the standard fashion.  I scrubbed back in.  The specimen was removed and placed in an Endocatch bag.   Pneumoperitoneum was released. Liposomal marcaine infiltrated in all incisions . 4-0 subcuticular Monocryl was used to  close the skin incision . Dermabond was  applied.  The patient was then extubated and brought to the recovery room in stable condition. Sponge, lap, and needle counts were correct at closure and at the conclusion of the case.               Sterling Big, MD, FACS

## 2023-01-28 NOTE — Anesthesia Procedure Notes (Signed)
Procedure Name: Intubation Date/Time: 01/28/2023 8:16 AM  Performed by: Hermenia Bers, CRNAPre-anesthesia Checklist: Patient identified, Patient being monitored, Timeout performed, Emergency Drugs available and Suction available Patient Re-evaluated:Patient Re-evaluated prior to induction Oxygen Delivery Method: Circle system utilized Preoxygenation: Pre-oxygenation with 100% oxygen Induction Type: IV induction Ventilation: Mask ventilation without difficulty Laryngoscope Size: McGraph and 4 Grade View: Grade I Tube type: Oral Tube size: 7.5 mm Number of attempts: 1 Airway Equipment and Method: Stylet Placement Confirmation: ETT inserted through vocal cords under direct vision, positive ETCO2 and breath sounds checked- equal and bilateral Secured at: 23 cm Tube secured with: Tape Dental Injury: Teeth and Oropharynx as per pre-operative assessment  Comments: Atraumatic DL and intubation x 1 attempt. Lips and teeth remain unchanged from preoperative assessment. Soft bite block placed along contralateral incisor line, tongue is free of pressure and midline.

## 2023-01-28 NOTE — Op Note (Signed)
Date of procedure: 01/28/23  Preoperative diagnosis:  Right retroperitoneal mass  Postoperative diagnosis:  Same   Procedure: Cystoscopy, right ureteral catheterization and instillation of ureteral ICG  Surgeon: Legrand Rams, MD  Anesthesia: General  Complications: None  Intraoperative findings:  Normal bladder, instillation of ICG into right ureter  EBL: None for urology portion  Specimens: None for urology portion  Drains: 77F foley   Indication: [name] [name] is a 47 y.o. patient with right retroperitoneal mass and right hydronephrosis, undergoing robotic resection with general surgery and right ureteral ICG was requested to aid in intraoperative identification.  After reviewing the management options for treatment, they elected to proceed with the above surgical procedure(s). We have discussed the potential benefits and risks of the procedure, side effects of the proposed treatment, the likelihood of the patient achieving the goals of the procedure, and any potential problems that might occur during the procedure or recuperation. Informed consent has been obtained.  Description of procedure:  The patient was taken to the operating room and general anesthesia was induced. SCDs were placed for DVT prophylaxis. The patient was placed in the dorsal lithotomy position, prepped and draped in the usual sterile fashion, and preoperative antibiotics were administered. A preoperative time-out was performed.   A 14F rigid cystoscope was used to intubate the urethra and normal appearing urethra followed proximally into the bladder. Small prostate. Bladder grossly normal throughout, no suspicious lesions, ureteral orifices orthotopic bilaterally.  A sensor wire was used to intubate the right ureteral orifice and advanced to the mid ureter. The 72f ureteral access catheter was advanced over the wire and wire removed. A total of 10mL of ICG was instilled into the right ureter and catheter  withdrawn.   A 77F foley passed easily into the bladder, 10mL placed in the balloon.  The case was turned over to the general surgery team.  Disposition: Continue with general surgery for robotic resection    Legrand Rams, MD

## 2023-01-28 NOTE — H&P (Addendum)
[place]   PATIENT NAME: [name] [name]    MR#:  [id number]  DATE OF BIRTH:  Dec 22, 1975  DATE OF ADMISSION:  01/28/2023  PRIMARY CARE PHYSICIAN: Malva Limes, MD   Patient is coming from: Home  REQUESTING/REFERRING PHYSICIAN: Miki Kins, MD  CHIEF COMPLAINT:   Chief Complaint  Patient presents with   Post-op Problem    HISTORY OF PRESENT ILLNESS:  [name] [name] is a 47 y.o. African American male with medical history significant for coronary artery disease, GERD, type 2 diabetes mellitus, hypertension, dyslipidemia and retroperitoneal mass for which she had a laparoscopic biopsy earlier during the day by Dr. Everlene Farrier on with cystoscopy by Dr. Richardo Hanks, who presented to the emergency room with acute onset of recurrent nausea and vomiting after going home with diffuse abdominal pain.  He denies any diarrhea.  No bilious vomitus or hematemesis.  He admits to chills but did not have any measured fever.  No chest pain or palpitations.  No cough or wheezing or dyspnea.    ED Course: When the patient came to the ER, vital signs were within normal.  Later on temperature was 99.1 and later 98.7.  Labs revealed a creatinine of 1.31 with GFR more than 60 and a blood glucose 183.  Lactic acid was 2 and later 2.7 and CBC was within normal. EKG as reviewed by me : None Imaging: Abdominal and pelvic CT scan revealed the following: 1. Retroperitoneal mass centered about the aortic bifurcation is slightly increased in size measuring 4.2 x 2.6 cm today, previously 3.4 x 2.3 cm using similar measuring technique. 2. Distended small bowel with tapering of the small bowel in the right lower quadrant and associated fecalization of small bowel contents anterior to the retroperitoneal mass. Findings suggest early or partial obstruction. 3. Unchanged right hydroureteronephrosis upstream from the area mid ureteral obstruction secondary to the retroperitoneal mass. 4. Gas is present  within the right renal collecting systems, ureter, and bladder. There is no free fluid in the abdomen or pelvis to suggest urine leak however ureteral injury is difficult to exclude. 5. Small locules of free intraperitoneal air in the upper abdomen due to laparoscopic biopsy earlier today.  The patient was given 1 mg of IV Dilaudid twice, 4 mg of IV Zofran twice, 1 L bolus of IV lactated Ringer and 1 L normal saline as well as 20 mg of IV Pepcid.  Contact was made with Dr. Aleen Campi who recommended NG tube placement and initiate conservative management.  Contact was made with Dr. Richardo Hanks who stated that the gas in the right renal collecting system, ureter and bladder as expected after cystoscopy procedure performed today.  He will be admitted to a medical bed for further evaluation and management.  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   Sleep apnea     PAST SURGICAL HISTORY:   Past Surgical History:  Procedure Laterality Date   ABSCESS DRAINAGE Right 2003   right arm   COLONOSCOPY WITH PROPOFOL N/A 10/10/2020   Procedure: COLONOSCOPY WITH PROPOFOL;  Surgeon: [name], MD;  Location: [facility] ENDOSCOPY;  Service: Gastroenterology;  Laterality: N/A;   CORONARY STENT PLACEMENT  10/2010   LAD   CYSTOSCOPY  01/28/2023   Procedure: CYSTOSCOPY;  Surgeon: Sondra Come, MD;  Location: [facility] ORS;  Service: Urology;;   XI  ROBOTIC ASSISTED LOWER ANTERIOR RESECTION N/A 01/28/2023   Procedure: XI ROBOTIC ASSISTED RETROPERITONEAL MASS EXCISION;  Surgeon: Leafy Ro, MD;  Location: [facility] ORS;  Service: General;  Laterality: N/A;    SOCIAL HISTORY:   Social History   Tobacco Use   Smoking status: Former    Current packs/day: 0.00    Average packs/day: 1 pack/day for 14.0 years (14.0 ttl pk-yrs)    Types: Cigarettes    Start date: 06/2003    Quit date: 06/2017    Years since quitting: 5.6    Passive exposure: Past   Smokeless tobacco: Never  Substance Use Topics   Alcohol use: Not Currently    Alcohol/week: 10.0 standard drinks of alcohol    Types: 2 Cans of beer, 8 Shots of liquor per week    Comment: quit approx 09/2022    FAMILY HISTORY:   Family History  Problem Relation Age of Onset   Hypertension Mother    Diabetes Mother    Breast cancer Mother    Multiple myeloma Mother    Hypertension Father    Diabetes Father    Heart disease Father        cabg 3; 1995; 18 stents   Hypertension Sister    Heart disease Paternal Uncle    Heart disease Paternal Grandmother     DRUG ALLERGIES:   Allergies  Allergen Reactions   Penicillins Hives   Trulicity [Dulaglutide] Diarrhea and Nausea And Vomiting    REVIEW OF SYSTEMS:   ROS As per history of present illness. All pertinent systems were reviewed above. Constitutional, HEENT, cardiovascular, respiratory, GI, GU, musculoskeletal, neuro, psychiatric, endocrine, integumentary and hematologic systems were reviewed and are otherwise negative/unremarkable except for positive findings mentioned above in the HPI.   MEDICATIONS AT HOME:   Prior to Admission medications   Medication Sig Start Date End Date Taking? Authorizing Provider  Accu-Chek Softclix Lancets lancets Use to check blood sugar daily for type 2 diabetes. E11.9 06/23/22   Merita Norton T, FNP  amLODipine (NORVASC) 10 MG tablet TAKE 1 TABLET(10 MG) BY MOUTH DAILY 06/26/22   Jacky Kindle, FNP  aspirin 81 MG tablet Take 81 mg by mouth daily.    [provider]  blood glucose meter kit and supplies Dispense based on patient and insurance preference. Use daily. (FOR ICD-10 E10.9, E11.9). 01/13/23   Jacky Kindle, FNP  Blood Glucose Monitoring Suppl DEVI 1 each by Does not apply route in the morning, at noon, and at bedtime. May substitute to any manufacturer covered by patient's insurance. 07/06/22   Jacky Kindle, FNP  Continuous Glucose Receiver (FREESTYLE LIBRE 3 READER) DEVI Use to check blood sugar for type 2 diabetes on insulin 01/13/23   Malva Limes, MD  Continuous Glucose Sensor (FREESTYLE LIBRE 3 SENSOR) MISC Place 1 sensor on the skin every 14 days. Use to check glucose continuously 11/04/22   Jacky Kindle, FNP  ezetimibe (ZETIA) 10 MG tablet Take 1 tablet (10 mg total) by mouth daily. 06/26/22   Jacky Kindle, FNP  gabapentin (NEURONTIN) 600 MG tablet Take 1 tablet (600 mg total) by mouth 3 (three) times daily. 01/13/23   Jacky Kindle, FNP  glipiZIDE (GLUCOTROL XL) 5 MG 24 hr tablet TAKE 1 TABLET(5 MG) BY MOUTH TWICE DAILY WITH A MEAL 01/13/23   Malva Limes, MD  glucose blood [facility] VERIO) test strip Use as instructed 10/07/22   Malva Limes, MD  HYDROcodone-acetaminophen (NORCO/VICODIN) 5-325 MG tablet Take 1-2 tablets by mouth every 4 (four) hours as needed for moderate pain. 01/28/23   Pabon, Diego F, MD  insulin glargine (LANTUS SOLOSTAR) 100 UNIT/ML Solostar Pen 16 units nightly; check fasting blood sugar daily. Titrate dose every 3 days by 2 units to meet fasting blood sugar of 110-120. Patient not taking: Reported on 01/27/2023 10/29/22   Merita Norton T, FNP  Insulin Pen Needle (PEN NEEDLES 5/16") 30G X 8 MM MISC 100 each by Does not apply route at bedtime. 10/28/22   Jacky Kindle, FNP  losartan (COZAAR) 25 MG tablet TAKE 1 TABLET(25 MG) BY MOUTH DAILY 01/25/23   Jacky Kindle, FNP  methocarbamol (ROBAXIN) 750 MG tablet Take 1 tablet (750 mg  total) by mouth every 8 (eight) hours as needed for muscle spasms. 01/13/23   Jacky Kindle, FNP  metoprolol succinate (TOPROL-XL) 25 MG 24 hr tablet Take 1 tablet (25 mg total) by mouth daily. 06/26/22   Jacky Kindle, FNP  nitroGLYCERIN (NITROSTAT) 0.4 MG SL tablet Place 1 tablet (0.4 mg total) under the tongue every 5 (five) minutes as needed for chest pain. Call 9-1-1 if you take 3 tablets. 06/26/22   Jacky Kindle, FNP  oxyCODONE (OXY IR/ROXICODONE) 5 MG immediate release tablet Take 1-2 tablets (5-10 mg total) by mouth every 4 (four) hours as needed for severe pain. 12/21/22   Rickard Patience, MD  pantoprazole (PROTONIX) 40 MG tablet TAKE 1 TABLET(40 MG) BY MOUTH DAILY 12/08/22   Jacky Kindle, FNP  QUEtiapine (SEROQUEL) 50 MG tablet Take 1 tablet (50 mg total) by mouth at bedtime. 01/13/23   Jacky Kindle, FNP  rosuvastatin (CRESTOR) 40 MG tablet TAKE 1 TABLET(40 MG) BY MOUTH DAILY 01/15/23   Jacky Kindle, FNP  sitaGLIPtin (JANUVIA) 100 MG tablet Take 1 tablet (100 mg total) by mouth daily. 12/08/22   Jacky Kindle, FNP  tamsulosin (FLOMAX) 0.4 MG CAPS capsule TAKE 2 CAPSULES(0.8 MG) BY MOUTH DAILY 10/28/22   Merita Norton T, FNP      VITAL SIGNS:  Blood pressure (!) 154/96, pulse 91, temperature 97.9 F (36.6 C), temperature source Oral, resp. rate 18, height 6\' 3"  (1.905 m), weight 113.9 kg, SpO2 99%.  PHYSICAL EXAMINATION:  Physical Exam  GENERAL:  47 y.o.-year-old African-American male patient lying in the bed with no acute distress.  EYES: Pupils equal, round, reactive to light and accommodation. No scleral icterus. Extraocular muscles intact.  HEENT: Head atraumatic, normocephalic. Oropharynx and nasopharynx clear.  NECK:  Supple, no jugular venous distention. No thyroid enlargement, no tenderness.  LUNGS: Normal breath sounds bilaterally, no wheezing, rales,rhonchi or crepitation. No use of accessory muscles of respiration.  CARDIOVASCULAR: Regular rate and rhythm, S1, S2 normal. No murmurs,  rubs, or gallops.  ABDOMEN: Soft, nondistended, with generalized tenderness without rebound tenderness guarding or rigidity and clean wounds of laparoscopy.  He had significantly diminished bowel sounds with no organomegaly or  mass.  EXTREMITIES: No pedal edema, cyanosis, or clubbing.  NEUROLOGIC: Cranial nerves II through XII are intact. Muscle strength 5/5 in all extremities. Sensation intact. Gait not checked.  PSYCHIATRIC: The patient is alert and oriented x 3.  Normal affect and good eye contact. SKIN: No obvious rash, lesion, or ulcer.   LABORATORY PANEL:   CBC Recent Labs  Lab 01/28/23 1922  WBC 8.1  HGB 13.8  HCT 41.5  PLT 169   ------------------------------------------------------------------------------------------------------------------  Chemistries  Recent Labs  Lab 01/28/23 1922  NA 137  K 4.8  CL 101  CO2 23  GLUCOSE 183*  BUN 16  CREATININE 1.31*  CALCIUM 9.9   ------------------------------------------------------------------------------------------------------------------  Cardiac Enzymes No results for input(s): "TROPONINI" in the last 168 hours. ------------------------------------------------------------------------------------------------------------------  RADIOLOGY:  CT ABDOMEN PELVIS W CONTRAST  Result Date: 01/28/2023 CLINICAL DATA:  Pt to ED via [facility] c.o post op problem. Pt was discharged from same day surgery around 4:30pm, pt had laparoscopic biopsy done. Pt endorses abd pain, nausea, and weakness. Denies CP, SOB, fevers, dizziness EXAM: CT ABDOMEN AND PELVIS WITH CONTRAST TECHNIQUE: Multidetector CT imaging of the abdomen and pelvis was performed using the standard protocol following bolus administration of intravenous contrast. RADIATION DOSE REDUCTION: This exam was performed according to the departmental dose-optimization program which includes automated exposure control, adjustment of the mA and/or kV according to patient size and/or use  of iterative reconstruction technique. CONTRAST:  OMNIPAQUE IOHEXOL 300 MG/ML  SOLN COMPARISON:  PET/CT 12/15/2022 and CT abdomen and pelvis 11/25/2022 FINDINGS: Lower chest: No acute abnormality. Hepatobiliary: No focal liver abnormality is seen. No gallstones, gallbladder wall thickening, or biliary dilatation. Pancreas: Unremarkable. Spleen: Unremarkable. Adrenals/Urinary Tract: Normal adrenal glands. Delayed right nephrogram. Gas is present within the right renal collecting systems, ureter, and bladder. Unchanged mild right hydroureteronephrosis upstream from the area of occlusion in the midportion of the right ureter secondary to the retroperitoneal mass. Unremarkable left kidney and ureter. Stomach/Bowel: Normal caliber colon. There is mild fluid-filled distention of the small bowel with relatively tapering of the small bowel in the right lower quadrant with associated fecalization of small bowel contents anterior to the retroperitoneum mass (circa series 2/image 62-73). Findings suggest early or partial obstruction. Stomach and appendix are within normal limits. Vascular/Lymphatic: Retroperitoneal mass about the aortic bifurcation extending along the right common iliac vessels. The right common and internal iliac arteries are narrowed by the mass. The veins are not well evaluated given contrast timing. No lymphadenopathy. Reproductive: Unremarkable. Other: Retroperitoneal mass centered about the aortic bifurcation is slightly increased in size measuring 4.2 x 2.6 cm today, previously 3.4 x 2.3 cm using similar measuring technique. Small locules of free intraperitoneal air in the upper abdomen likely due to laparoscopic biopsy earlier today. No free fluid in the abdomen or pelvis. No hematoma. Musculoskeletal: No acute fracture.  No destructive osseous lesion. IMPRESSION: 1. Retroperitoneal mass centered about the aortic bifurcation is slightly increased in size measuring 4.2 x 2.6 cm today, previously  3.4 x 2.3 cm using similar measuring technique. 2. Distended small bowel with tapering of the small bowel in the right lower quadrant and associated fecalization of small bowel contents anterior to the retroperitoneal mass. Findings suggest early or partial obstruction. 3. Unchanged right hydroureteronephrosis upstream from the area mid ureteral obstruction secondary to the retroperitoneal mass. 4. Gas is present within the right renal collecting systems, ureter, and bladder. There is no free fluid in the abdomen or pelvis to suggest urine leak however ureteral  injury is difficult to exclude. 5. Small locules of free intraperitoneal air in the upper abdomen due to laparoscopic biopsy earlier today. Electronically Signed   By: Minerva Fester M.D.   On: 01/28/2023 21:25      IMPRESSION AND PLAN:  Assessment and Plan: * Small bowel obstruction (HCC) - The patient will be admitted to a medical bed. - He will be kept NPO except for medications. - NG tube will be inserted. - General Surgery consult will be obtained. - Dr. Aleen Campi was notified about the patient. - We will repeat two-view abdomen x-ray in a.m.  Type 2 diabetes mellitus with peripheral neuropathy (HCC) - The patient will be placed on supplemental coverage with NovoLog. - We will continue Januvia and hold off glipizide ER. - We will continue Neurontin.  AKI (acute kidney injury) (HCC) - This is likely prerenal due to recurrent nausea and vomiting. - Will hydrate with IV normal saline and follow BMP. - We will avoid nephrotoxins.  Retroperitoneal mass - She had a laparoscopic biopsy of the result of which is currently pending.  Essential hypertension - We will continue his antihypertensives.  BPH (benign prostatic hyperplasia) - We will continue Flomax.  GERD without esophagitis - We will continue PPI therapy  Dyslipidemia - We will continue statin therapy and Zetia.   DVT prophylaxis: Lovenox.  Advanced Care Planning:   Code Status: full code.  Family Communication:  The plan of care was discussed in details with the patient (and family). I answered all questions. The patient agreed to proceed with the above mentioned plan. Further management will depend upon hospital course. Disposition Plan: Back to previous home environment Consults called: none.  All the records are reviewed and case discussed with ED provider.  Status is: Inpatient  At the time of the admission, it appears that the appropriate admission status for this patient is inpatient.  This is judged to be reasonable and necessary in order to provide the required intensity of service to ensure the patient's safety given the presenting symptoms, physical exam findings and initial radiographic and laboratory data in the context of comorbid conditions.  The patient requires inpatient status due to high intensity of service, high risk of further deterioration and high frequency of surveillance required.  I certify that at the time of admission, it is my clinical judgment that the patient will require inpatient hospital care extending more than 2 midnights.                            Dispo: The patient is from: Home              Anticipated d/c is to: Home              Patient currently is not medically stable to d/c.              Difficult to place patient: No  Hannah Beat M.D on 01/29/2023 at 5:15 AM  [facility]   From 7 PM-7 AM, contact night-coverage [email or web address]  CC: Primary care physician; Malva Limes, MD

## 2023-01-28 NOTE — Interval H&P Note (Signed)
History and Physical Interval Note:  01/28/2023 7:25 AM  [name] [name]  has presented today for surgery, with the diagnosis of retroperitoneal mass.  The various methods of treatment have been discussed with the patient and family. After consideration of risks, benefits and other options for treatment, the patient has consented to  Procedure(s): XI ROBOTIC ASSISTED RETROPERITONEAL MASS EXCISION (N/A) INDOCYANINE GREEN FLUORESCENCE IMAGING (ICG) (Bilateral) as a surgical intervention.  The patient's history has been reviewed, patient examined, no change in status, stable for surgery.  I have reviewed the patient's chart and labs.  Questions were answered to the patient's satisfaction.     Dilia Alemany F Grayce Budden

## 2023-01-29 ENCOUNTER — Inpatient Hospital Stay

## 2023-01-29 DIAGNOSIS — N400 Enlarged prostate without lower urinary tract symptoms: Secondary | ICD-10-CM | POA: Insufficient documentation

## 2023-01-29 DIAGNOSIS — E785 Hyperlipidemia, unspecified: Secondary | ICD-10-CM | POA: Insufficient documentation

## 2023-01-29 DIAGNOSIS — E1142 Type 2 diabetes mellitus with diabetic polyneuropathy: Secondary | ICD-10-CM

## 2023-01-29 DIAGNOSIS — N2889 Other specified disorders of kidney and ureter: Secondary | ICD-10-CM | POA: Diagnosis not present

## 2023-01-29 DIAGNOSIS — N179 Acute kidney failure, unspecified: Secondary | ICD-10-CM

## 2023-01-29 DIAGNOSIS — K219 Gastro-esophageal reflux disease without esophagitis: Secondary | ICD-10-CM | POA: Insufficient documentation

## 2023-01-29 DIAGNOSIS — K56609 Unspecified intestinal obstruction, unspecified as to partial versus complete obstruction: Secondary | ICD-10-CM | POA: Diagnosis not present

## 2023-01-29 LAB — LACTIC ACID, PLASMA
Lactic Acid, Venous: 1.2 mmol/L (ref 0.5–1.9)
Lactic Acid, Venous: 1.9 mmol/L (ref 0.5–1.9)

## 2023-01-29 LAB — BASIC METABOLIC PANEL
Anion gap: 13 (ref 5–15)
BUN: 15 mg/dL (ref 6–20)
CO2: 23 mmol/L (ref 22–32)
Calcium: 9.8 mg/dL (ref 8.9–10.3)
Chloride: 107 mmol/L (ref 98–111)
Creatinine, Ser: 1.13 mg/dL (ref 0.61–1.24)
GFR, Estimated: >60 mL/min (ref >60)
Glucose, Bld: 121 mg/dL — ABNORMAL HIGH (ref 70–99)
Potassium: 4.2 mmol/L (ref 3.5–5.1)
Sodium: 143 mmol/L (ref 135–145)

## 2023-01-29 LAB — GLUCOSE, CAPILLARY
Glucose-Capillary: 179 mg/dL — ABNORMAL HIGH (ref 70–99)
Glucose-Capillary: 158 mg/dL — ABNORMAL HIGH (ref 70–99)
Glucose-Capillary: 148 mg/dL — ABNORMAL HIGH (ref 70–99)
Glucose-Capillary: 129 mg/dL — ABNORMAL HIGH (ref 70–99)

## 2023-01-29 LAB — CBC
HCT: 35.5 % — ABNORMAL LOW (ref 39.0–52.0)
Hemoglobin: 12.3 g/dL — ABNORMAL LOW (ref 13.0–17.0)
MCH: 30.1 pg (ref 26.0–34.0)
MCHC: 34.6 g/dL (ref 30.0–36.0)
MCV: 87 fL (ref 80.0–100.0)
Platelets: 213 10*3/uL (ref 150–400)
RBC: 4.08 MIL/uL — ABNORMAL LOW (ref 4.22–5.81)
RDW: 13.6 % (ref 11.5–15.5)
WBC: 10.1 10*3/uL (ref 4.0–10.5)
nRBC: 0 % (ref 0.0–0.2)

## 2023-01-29 LAB — URINALYSIS, ROUTINE W REFLEX MICROSCOPIC
Bilirubin Urine: NEGATIVE
Glucose, UA: NEGATIVE mg/dL
Hgb urine dipstick: NEGATIVE
Ketones, ur: 5 mg/dL — AB
Leukocytes,Ua: NEGATIVE
Nitrite: NEGATIVE
Protein, ur: NEGATIVE mg/dL
Specific Gravity, Urine: 1.017 (ref 1.005–1.030)
pH: 5 (ref 5.0–8.0)

## 2023-01-29 LAB — MAGNESIUM: Magnesium: 1.8 mg/dL (ref 1.7–2.4)

## 2023-01-29 MED ORDER — INSULIN ASPART 100 UNIT/ML IJ SOLN: 0.0000 [IU] | INTRAMUSCULAR | Status: DC

## 2023-01-29 MED ORDER — HYDROMORPHONE HCL 1 MG/ML IJ SOLN
0.5000 mg | INTRAMUSCULAR | Status: DC | PRN
  Administered 2023-01-30 – 2023-01-31 (×4): 0.5 mg via INTRAVENOUS
  Filled 2023-01-29 (×4): qty 0.5

## 2023-01-29 MED ORDER — ACETAMINOPHEN 10 MG/ML IV SOLN
1000.0000 mg | Freq: Four times a day (QID) | INTRAVENOUS | Status: AC
  Administered 2023-01-29 – 2023-01-30 (×4): 1000 mg via INTRAVENOUS
  Filled 2023-01-29 (×4): qty 100

## 2023-01-29 MED ORDER — PROCHLORPERAZINE EDISYLATE 10 MG/2ML IJ SOLN
10.0000 mg | Freq: Once | INTRAMUSCULAR | Status: AC
  Administered 2023-01-29: 10 mg via INTRAVENOUS
  Filled 2023-01-29 (×2): qty 2

## 2023-01-29 MED ORDER — ALBUMIN HUMAN 25 % IV SOLN
25.0000 g | Freq: Once | INTRAVENOUS | Status: AC
  Administered 2023-01-29: 25 g via INTRAVENOUS
  Filled 2023-01-29: qty 100

## 2023-01-29 MED ORDER — INSULIN ASPART 100 UNIT/ML IJ SOLN
0.0000 [IU] | Freq: Four times a day (QID) | INTRAMUSCULAR | Status: DC
  Administered 2023-01-29 (×2): 1 [IU] via SUBCUTANEOUS
  Administered 2023-01-29: 2 [IU] via SUBCUTANEOUS
  Administered 2023-01-30 – 2023-01-31 (×2): 1 [IU] via SUBCUTANEOUS
  Filled 2023-01-29 (×5): qty 1

## 2023-01-29 MED ORDER — OXYCODONE HCL 5 MG PO TABS
5.0000 mg | ORAL_TABLET | ORAL | Status: DC | PRN
  Administered 2023-01-30 – 2023-02-01 (×5): 5 mg via ORAL
  Filled 2023-01-29 (×5): qty 1

## 2023-01-29 MED ORDER — LORAZEPAM 2 MG/ML IJ SOLN: 0.5000 mg | Freq: Four times a day (QID) | INTRAMUSCULAR | Status: DC | PRN

## 2023-01-29 MED ORDER — HYDROMORPHONE HCL 1 MG/ML IJ SOLN
0.5000 mg | INTRAMUSCULAR | Status: DC | PRN
  Administered 2023-01-29: 0.5 mg via INTRAVENOUS
  Filled 2023-01-29: qty 0.5

## 2023-01-29 MED ORDER — KETOROLAC TROMETHAMINE 30 MG/ML IJ SOLN
30.0000 mg | Freq: Four times a day (QID) | INTRAMUSCULAR | Status: DC
  Administered 2023-01-29 – 2023-02-01 (×11): 30 mg via INTRAVENOUS
  Filled 2023-01-29 (×12): qty 1

## 2023-01-29 NOTE — Plan of Care (Signed)

## 2023-01-29 NOTE — Assessment & Plan Note (Signed)
-   We will continue Flomax 

## 2023-01-29 NOTE — Anesthesia Postprocedure Evaluation (Signed)
Anesthesia Post Note  Patient: [name] [name]  Procedure(s) Performed: XI ROBOTIC ASSISTED RETROPERITONEAL MASS EXCISION (Perineum) INDOCYANINE GREEN FLUORESCENCE IMAGING (ICG) (Bilateral) CYSTOSCOPY (Ureter)  Patient location during evaluation: PACU Anesthesia Type: General Level of consciousness: awake and alert Pain management: pain level controlled Vital Signs Assessment: post-procedure vital signs reviewed and stable Respiratory status: spontaneous breathing, nonlabored ventilation, respiratory function stable and patient connected to nasal cannula oxygen Cardiovascular status: blood pressure returned to baseline and stable Postop Assessment: no apparent nausea or vomiting Anesthetic complications: no   No notable events documented.   Last Vitals:  Vitals:   01/28/23 1345 01/28/23 1402  BP: 112/77 120/84  Pulse: 98 94  Resp: 16 16  Temp: 36.8 C   SpO2: 100% 97%    Last Pain:  Vitals:   01/28/23 1402  TempSrc: Temporal  PainSc: 2                  Stephanie Coup

## 2023-01-29 NOTE — Progress Notes (Signed)
Initial Nutrition Assessment  DOCUMENTATION CODES:   Obesity unspecified  INTERVENTION:   -RD will follow for diet advancement and add supplements as appropriate  NUTRITION DIAGNOSIS:   Increased nutrient needs related to post-op healing as evidenced by estimated needs.  GOAL:   Patient will meet greater than or equal to 90% of their needs  MONITOR:   Diet advancement  REASON FOR ASSESSMENT:   Malnutrition Screening Tool    ASSESSMENT:   Pt with medical history significant for coronary artery disease, GERD, type 2 diabetes mellitus, hypertension, dyslipidemia and retroperitoneal mass for which he had a laparoscopic biopsy and cystoscopy on 01/28/23, who with acute onset of recurrent nausea and vomiting after going home with diffuse abdominal pain.  Pt admitted with SBO.   8/22- s/p laparoscopic bx of retroperitoneal mass and cystoscopy 8/23- NGT placed for decompression; KUB reveals side port in GE junction and tip in gastric antrum  Reviewed I/O's: +189 ml x 24 hours  UOP: 1.1 L x 24 hours   Pt with retroperitoneal mass; biopsy results pending.   Spoke with pt and mom at bedside. Pt drowsy, but awoke to answer questions and was engaged during interview. He complains of tenderness in abdomen. PTA he reports good oral intake, consuming 3 meals per day.   Pt shares that he has progressively lost wt over the past year. His UBW is around 270# and lost about 20#. Reviewed wt hx; wt has been stable over the past 3 months. Pt offers that weight loss has mainly been intentional as he has diabetes and has made significant lifestyle changes (healthier foods choices and working out at the gym) to optimize his glycemic control.   Pt understanding of rationale for NGT and NPO status. He is eager to eat when able. Per general surgery notes, plan repeat x-ray and potential removal of NGT today. Discussed importance of good meal and supplement intake to promote healing. Pt amenable to  supplements once diet is advanced.   Medications reviewed and include 0.9% sodium chloride infusion @ 100 ml/hr.  Lab Results  Component Value Date   HGBA1C 8.2 (A) 01/13/2023   PTA DM medications are 5 mg glipizide BID, 100 mg sitagliptin daily, and 16 units insulin glargine daily.   Labs reviewed: CBGS: 148-179 (inpatient orders for glycemic control are 5 mg linagliptin daily and 0-9 units insulin aspart every 6 hours).    NUTRITION - FOCUSED PHYSICAL EXAM:  Flowsheet Row Most Recent Value  Orbital Region No depletion  Upper Arm Region No depletion  Thoracic and Lumbar Region No depletion  Buccal Region No depletion  Temple Region No depletion  Clavicle Bone Region No depletion  Clavicle and Acromion Bone Region No depletion  Scapular Bone Region No depletion  Dorsal Hand No depletion  Patellar Region No depletion  Anterior Thigh Region No depletion  Posterior Calf Region No depletion  Edema (RD Assessment) None  Hair Reviewed  Eyes Reviewed  Mouth Reviewed  Skin Reviewed  Nails Reviewed       Diet Order:   Diet Order             Diet NPO time specified Except for: Sips with Meds  Diet effective now                   EDUCATION NEEDS:   Education needs have been addressed  Skin:  Skin Assessment: Reviewed RN Assessment  Last BM:  01/28/23  Height:   Ht Readings from Last 1 Encounters:  01/29/23 6\' 3"  (1.905 m)    Weight:   Wt Readings from Last 1 Encounters:  01/29/23 113.9 kg    Ideal Body Weight:  89.1 kg  BMI:  Body mass index is 31.4 kg/m.  Estimated Nutritional Needs:   Kcal:  1610-9604  Protein:  115-130 grams  Fluid:  > 2 L    Levada Schilling, RD, LDN, CDCES Registered Dietitian II Certified Diabetes Care and Education Specialist Please refer to [facility] for RD and/or RD on-call/weekend/after hours pager

## 2023-01-29 NOTE — Progress Notes (Signed)
CC: Ileus Subjective: Readmitted after robotic retroperitoneal excisional bx Feeling better, still some pain + flatus KUB pers reviewed  Appreciate GU  Objective: Vital signs in last 24 hours: Temp:  [97.9 F (36.6 C)-99.1 F (37.3 C)] 99 F (37.2 C) (08/23 0813) Pulse Rate:  [68-105] 94 (08/23 0813) Resp:  [13-22] 16 (08/23 0813) BP: (106-158)/(59-96) 129/78 (08/23 0813) SpO2:  [96 %-100 %] 100 % (08/23 0813) Weight:  [113.9 kg] 113.9 kg (08/23 0257) Last BM Date : 01/28/23  Intake/Output from previous day: 08/22 0701 - 08/23 0700 In: 1288.8 [I.V.:239.8; IV Piggyback:1049] Out: 1100 [Urine:1100] Intake/Output this shift: No intake/output data recorded.  Physical exam:  NAd alert Abd: soft, appropriate incisional tenderness, no peritonitis or rebound. Wounds healing well. Mildly distended  Lab Results: CBC  Recent Labs    01/28/23 1922 01/29/23 0423  WBC 8.1 10.1  HGB 13.8 12.3*  HCT 41.5 35.5*  PLT 169 213   BMET Recent Labs    01/28/23 1922 01/29/23 0423  NA 137 143  K 4.8 4.2  CL 101 107  CO2 23 23  GLUCOSE 183* 121*  BUN 16 15  CREATININE 1.31* 1.13  CALCIUM 9.9 9.8   PT/INR No results for input(s): "LABPROT", "INR" in the last 72 hours. ABG No results for input(s): "PHART", "HCO3" in the last 72 hours.  Invalid input(s): "PCO2", "PO2"  Studies/Results: DG Abd 1 View  Result Date: 01/29/2023 CLINICAL DATA:  Small-bowel obstruction, enteric catheter EXAM: ABDOMEN - 1 VIEW COMPARISON:  01/29/2023 FINDINGS: Frontal view of the lower chest and upper abdomen demonstrates repositioning of the enteric catheter, tip projecting over the gastric antrum. Side port projects approximately 2 cm below the gastroesophageal junction. Bowel gas pattern is stable. Lung bases are clear. IMPRESSION: 1. Slight repositioning of the enteric catheter, side port projecting approximately 2 cm below the gastroesophageal junction and tip projecting over the gastric antrum.  Electronically Signed   By: Sharlet Salina M.D.   On: 01/29/2023 08:52   DG Abd 1 View  Result Date: 01/29/2023 CLINICAL DATA:  [id number], feeding tube placement. EXAM: ABDOMEN - 1 VIEW COMPARISON:  Earlier study today at 5:05 a.m. FINDINGS: 5:49 a.m. NGT has been only slightly advanced. The tip is within the stomach with side hole is just below the hiatus and could be advanced further in for optimal placement. Mild small bowel dilatation in the upper abdomen continues to be seen. The bowel pattern is unchanged. There is no semi-erect evidence of free air.  Lung bases are clear. No pathologic calcification is evident with only the upper to mid abdomen included in the exam. IMPRESSION: NGT has been only slightly advanced. The tip is within the stomach with the side hole is just below the hiatus and could be advanced further in for optimal placement. Electronically Signed   By: Almira Bar M.D.   On: 01/29/2023 06:29   DG Abd Portable 1V  Result Date: 01/29/2023 CLINICAL DATA:  47 year old male feeding tube placement. EXAM: PORTABLE ABDOMEN - 1 VIEW COMPARISON:  CT Abdomen and Pelvis 01/28/2023. FINDINGS: AP view at 0505 hours. Enteric tube tip is at the level of the distal gastric fundus. But the side hole projects in the midline at the level of the GEJ. Lung base appears stable. Bowel-gas pattern is nonobstructed. Stable visualized osseous structures. IMPRESSION: Enteric tube placed just inside the stomach, side hole still at the level of the GEJ. Advance 5 cm to ensure side hole placement within the stomach. Electronically Signed  By: Odessa Fleming M.D.   On: 01/29/2023 05:20   CT ABDOMEN PELVIS W CONTRAST  Result Date: 01/28/2023 CLINICAL DATA:  Pt to ED via [facility] c.o post op problem. Pt was discharged from same day surgery around 4:30pm, pt had laparoscopic biopsy done. Pt endorses abd pain, nausea, and weakness. Denies CP, SOB, fevers, dizziness EXAM: CT ABDOMEN AND PELVIS WITH CONTRAST TECHNIQUE:  Multidetector CT imaging of the abdomen and pelvis was performed using the standard protocol following bolus administration of intravenous contrast. RADIATION DOSE REDUCTION: This exam was performed according to the departmental dose-optimization program which includes automated exposure control, adjustment of the mA and/or kV according to patient size and/or use of iterative reconstruction technique. CONTRAST:  OMNIPAQUE IOHEXOL 300 MG/ML  SOLN COMPARISON:  PET/CT 12/15/2022 and CT abdomen and pelvis 11/25/2022 FINDINGS: Lower chest: No acute abnormality. Hepatobiliary: No focal liver abnormality is seen. No gallstones, gallbladder wall thickening, or biliary dilatation. Pancreas: Unremarkable. Spleen: Unremarkable. Adrenals/Urinary Tract: Normal adrenal glands. Delayed right nephrogram. Gas is present within the right renal collecting systems, ureter, and bladder. Unchanged mild right hydroureteronephrosis upstream from the area of occlusion in the midportion of the right ureter secondary to the retroperitoneal mass. Unremarkable left kidney and ureter. Stomach/Bowel: Normal caliber colon. There is mild fluid-filled distention of the small bowel with relatively tapering of the small bowel in the right lower quadrant with associated fecalization of small bowel contents anterior to the retroperitoneum mass (circa series 2/image 62-73). Findings suggest early or partial obstruction. Stomach and appendix are within normal limits. Vascular/Lymphatic: Retroperitoneal mass about the aortic bifurcation extending along the right common iliac vessels. The right common and internal iliac arteries are narrowed by the mass. The veins are not well evaluated given contrast timing. No lymphadenopathy. Reproductive: Unremarkable. Other: Retroperitoneal mass centered about the aortic bifurcation is slightly increased in size measuring 4.2 x 2.6 cm today, previously 3.4 x 2.3 cm using similar measuring technique. Small locules  of free intraperitoneal air in the upper abdomen likely due to laparoscopic biopsy earlier today. No free fluid in the abdomen or pelvis. No hematoma. Musculoskeletal: No acute fracture.  No destructive osseous lesion. IMPRESSION: 1. Retroperitoneal mass centered about the aortic bifurcation is slightly increased in size measuring 4.2 x 2.6 cm today, previously 3.4 x 2.3 cm using similar measuring technique. 2. Distended small bowel with tapering of the small bowel in the right lower quadrant and associated fecalization of small bowel contents anterior to the retroperitoneal mass. Findings suggest early or partial obstruction. 3. Unchanged right hydroureteronephrosis upstream from the area mid ureteral obstruction secondary to the retroperitoneal mass. 4. Gas is present within the right renal collecting systems, ureter, and bladder. There is no free fluid in the abdomen or pelvis to suggest urine leak however ureteral injury is difficult to exclude. 5. Small locules of free intraperitoneal air in the upper abdomen due to laparoscopic biopsy earlier today. Electronically Signed   By: Minerva Fester M.D.   On: 01/28/2023 21:25    Anti-infectives: Anti-infectives (From admission, onward)    None       Assessment/Plan:  Ileus, continue ng xray in am Likely able to dc ng in am No surgical intervention Appreciate hospitalist and GU assistance   Sterling Big, MD, [facility]  01/29/2023

## 2023-01-29 NOTE — Assessment & Plan Note (Addendum)
-  Continue Zetia and Lipitor 

## 2023-01-29 NOTE — Progress Notes (Signed)
PROGRESS NOTE    [name] [name]  ZOX:[id number] DOB: 12-Jun-1975 DOA: 01/28/2023 PCP: Malva Limes, MD    Brief Narrative:  47 y.o. African American male with medical history significant for coronary artery disease, GERD, type 2 diabetes mellitus, hypertension, dyslipidemia and retroperitoneal mass for which she had a laparoscopic biopsy earlier during the day by Dr. Everlene Farrier on with cystoscopy by Dr. Richardo Hanks, who presented to the emergency room with acute onset of recurrent nausea and vomiting after going home with diffuse abdominal pain.  He denies any diarrhea.  No bilious vomitus or hematemesis.  He admits to chills but did not have any measured fever.  No chest pain or palpitations.  No cough or wheezing or dyspnea.     Assessment & Plan:   Principal Problem:   Small bowel obstruction (HCC) Active Problems:   Type 2 diabetes mellitus with peripheral neuropathy (HCC)   Retroperitoneal mass   AKI (acute kidney injury) (HCC)   Essential hypertension   Dyslipidemia   GERD without esophagitis   BPH (benign prostatic hyperplasia) Partial SBO versus ileus Seen in consultation by general surgery.  No surgical intervention warranted at this time Plan: N.p.o. except for meds Continue NGT set to LIS Repeat KUB in a.m. Anticipate discontinuation of tube in a.m. Minimize use of narcotics   Type 2 diabetes mellitus with peripheral neuropathy (HCC) SSI, Januvia, carb modified diet when advanced Okay for Neurontin   AKI (acute kidney injury) (HCC) Suspect prerenal due to recurrent nausea and vomiting.  Hydrate with normal saline.  Reassess   Retroperitoneal mass Status post laparoscopic biopsy.  Results pending.  Outpatient follow-up   Essential hypertension BP controlled   BPH (benign prostatic hyperplasia) PTA Flomax   GERD without esophagitis PTA PPI   Dyslipidemia Statin and Zetia   DVT prophylaxis: SQ Lovenox Code Status: Full Family Communication: Family member at  bedside 8/23 Disposition Plan: Status is: Inpatient Remains inpatient appropriate because: Partial SBO versus ileus.  NG tube in place.  Possible discharge in 24 hours.   Level of care: Med-Surg  Consultants:  General surgery  Procedures:  NGT placement  Antimicrobials: None   Subjective: Seen and examined.  Resting in bed.  Anxious appearing.  Reports abdominal discomfort.  Objective: Vitals:   01/29/23 0232 01/29/23 0257 01/29/23 0448 01/29/23 0813  BP: (!) 158/88  (!) 154/96 129/78  Pulse: 75  91 94  Resp: 18  18 16   Temp: 98.7 F (37.1 C)  97.9 F (36.6 C) 99 F (37.2 C)  TempSrc: Oral  Oral Oral  SpO2: 100%  99% 100%  Weight:  113.9 kg    Height:  6\' 3"  (1.905 m)      Intake/Output Summary (Last 24 hours) at 01/29/2023 1343 Last data filed at 01/29/2023 0500 Gross per 24 hour  Intake 1288.83 ml  Output 1100 ml  Net 188.83 ml   Filed Weights   01/29/23 0257  Weight: 113.9 kg    Examination:  General exam: Appears anxious Respiratory system: Clear to auscultation. Respiratory effort normal. Cardiovascular system: S1-2, RRR, no murmurs, no pedal edema Gastrointestinal system: Soft, nondistended, diffuse tender to palpation, hyperactive bowel sounds Central nervous system: Alert and oriented. No focal neurological deficits. Extremities: Symmetric 5 x 5 power. Skin: No rashes, lesions or ulcers Psychiatry: Judgement and insight appear normal. Mood & affect appropriate.     Data Reviewed: I have personally reviewed following labs and imaging studies  CBC: Recent Labs  Lab 01/28/23 1922 01/29/23 0423  WBC 8.1 10.1  NEUTROABS 7.4  --   HGB 13.8 12.3*  HCT 41.5 35.5*  MCV 90.0 87.0  PLT 169 213   Basic Metabolic Panel: Recent Labs  Lab 01/28/23 1922 01/29/23 0423 01/29/23 0557  NA 137 143  --   K 4.8 4.2  --   CL 101 107  --   CO2 23 23  --   GLUCOSE 183* 121*  --   BUN 16 15  --   CREATININE 1.31* 1.13  --   CALCIUM 9.9 9.8  --   MG   --   --  1.8   GFR: Estimated Creatinine Clearance: 112.4 mL/min (by C-G formula based on SCr of 1.13 mg/dL). Liver Function Tests: No results for input(s): "AST", "ALT", "ALKPHOS", "BILITOT", "PROT", "ALBUMIN" in the last 168 hours. Recent Labs  Lab 01/28/23 1922  LIPASE 44   No results for input(s): "AMMONIA" in the last 168 hours. Coagulation Profile: No results for input(s): "INR", "PROTIME" in the last 168 hours. Cardiac Enzymes: No results for input(s): "CKTOTAL", "CKMB", "CKMBINDEX", "TROPONINI" in the last 168 hours. BNP (last 3 results) No results for input(s): "PROBNP" in the last 8760 hours. HbA1C: No results for input(s): "HGBA1C" in the last 72 hours. CBG: Recent Labs  Lab 01/28/23 0721 01/28/23 1245 01/29/23 0619 01/29/23 0915 01/29/23 1243  GLUCAP 124* 260* 179* 158* 148*   Lipid Profile: No results for input(s): "CHOL", "HDL", "LDLCALC", "TRIG", "CHOLHDL", "LDLDIRECT" in the last 72 hours. Thyroid Function Tests: No results for input(s): "TSH", "T4TOTAL", "FREET4", "T3FREE", "THYROIDAB" in the last 72 hours. Anemia Panel: No results for input(s): "VITAMINB12", "FOLATE", "FERRITIN", "TIBC", "IRON", "RETICCTPCT" in the last 72 hours. Sepsis Labs: Recent Labs  Lab 01/28/23 1945 01/28/23 2127 01/29/23 0557 01/29/23 0847  LATICACIDVEN 2.0* 2.7* 1.2 1.9    No results found for this or any previous visit (from the past 240 hour(s)).       Radiology Studies: DG Abd 1 View  Result Date: 01/29/2023 CLINICAL DATA:  Small-bowel obstruction, enteric catheter EXAM: ABDOMEN - 1 VIEW COMPARISON:  01/29/2023 FINDINGS: Frontal view of the lower chest and upper abdomen demonstrates repositioning of the enteric catheter, tip projecting over the gastric antrum. Side port projects approximately 2 cm below the gastroesophageal junction. Bowel gas pattern is stable. Lung bases are clear. IMPRESSION: 1. Slight repositioning of the enteric catheter, side port projecting  approximately 2 cm below the gastroesophageal junction and tip projecting over the gastric antrum. Electronically Signed   By: Sharlet Salina M.D.   On: 01/29/2023 08:52   DG Abd 1 View  Result Date: 01/29/2023 CLINICAL DATA:  [id number], feeding tube placement. EXAM: ABDOMEN - 1 VIEW COMPARISON:  Earlier study today at 5:05 a.m. FINDINGS: 5:49 a.m. NGT has been only slightly advanced. The tip is within the stomach with side hole is just below the hiatus and could be advanced further in for optimal placement. Mild small bowel dilatation in the upper abdomen continues to be seen. The bowel pattern is unchanged. There is no semi-erect evidence of free air.  Lung bases are clear. No pathologic calcification is evident with only the upper to mid abdomen included in the exam. IMPRESSION: NGT has been only slightly advanced. The tip is within the stomach with the side hole is just below the hiatus and could be advanced further in for optimal placement. Electronically Signed   By: Almira Bar M.D.   On: 01/29/2023 06:29   DG Abd Portable 1V  Result  Date: 01/29/2023 CLINICAL DATA:  47 year old male feeding tube placement. EXAM: PORTABLE ABDOMEN - 1 VIEW COMPARISON:  CT Abdomen and Pelvis 01/28/2023. FINDINGS: AP view at 0505 hours. Enteric tube tip is at the level of the distal gastric fundus. But the side hole projects in the midline at the level of the GEJ. Lung base appears stable. Bowel-gas pattern is nonobstructed. Stable visualized osseous structures. IMPRESSION: Enteric tube placed just inside the stomach, side hole still at the level of the GEJ. Advance 5 cm to ensure side hole placement within the stomach. Electronically Signed   By: Odessa Fleming M.D.   On: 01/29/2023 05:20   CT ABDOMEN PELVIS W CONTRAST  Result Date: 01/28/2023 CLINICAL DATA:  Pt to ED via [facility] c.o post op problem. Pt was discharged from same day surgery around 4:30pm, pt had laparoscopic biopsy done. Pt endorses abd pain, nausea, and  weakness. Denies CP, SOB, fevers, dizziness EXAM: CT ABDOMEN AND PELVIS WITH CONTRAST TECHNIQUE: Multidetector CT imaging of the abdomen and pelvis was performed using the standard protocol following bolus administration of intravenous contrast. RADIATION DOSE REDUCTION: This exam was performed according to the departmental dose-optimization program which includes automated exposure control, adjustment of the mA and/or kV according to patient size and/or use of iterative reconstruction technique. CONTRAST:  OMNIPAQUE IOHEXOL 300 MG/ML  SOLN COMPARISON:  PET/CT 12/15/2022 and CT abdomen and pelvis 11/25/2022 FINDINGS: Lower chest: No acute abnormality. Hepatobiliary: No focal liver abnormality is seen. No gallstones, gallbladder wall thickening, or biliary dilatation. Pancreas: Unremarkable. Spleen: Unremarkable. Adrenals/Urinary Tract: Normal adrenal glands. Delayed right nephrogram. Gas is present within the right renal collecting systems, ureter, and bladder. Unchanged mild right hydroureteronephrosis upstream from the area of occlusion in the midportion of the right ureter secondary to the retroperitoneal mass. Unremarkable left kidney and ureter. Stomach/Bowel: Normal caliber colon. There is mild fluid-filled distention of the small bowel with relatively tapering of the small bowel in the right lower quadrant with associated fecalization of small bowel contents anterior to the retroperitoneum mass (circa series 2/image 62-73). Findings suggest early or partial obstruction. Stomach and appendix are within normal limits. Vascular/Lymphatic: Retroperitoneal mass about the aortic bifurcation extending along the right common iliac vessels. The right common and internal iliac arteries are narrowed by the mass. The veins are not well evaluated given contrast timing. No lymphadenopathy. Reproductive: Unremarkable. Other: Retroperitoneal mass centered about the aortic bifurcation is slightly increased in size  measuring 4.2 x 2.6 cm today, previously 3.4 x 2.3 cm using similar measuring technique. Small locules of free intraperitoneal air in the upper abdomen likely due to laparoscopic biopsy earlier today. No free fluid in the abdomen or pelvis. No hematoma. Musculoskeletal: No acute fracture.  No destructive osseous lesion. IMPRESSION: 1. Retroperitoneal mass centered about the aortic bifurcation is slightly increased in size measuring 4.2 x 2.6 cm today, previously 3.4 x 2.3 cm using similar measuring technique. 2. Distended small bowel with tapering of the small bowel in the right lower quadrant and associated fecalization of small bowel contents anterior to the retroperitoneal mass. Findings suggest early or partial obstruction. 3. Unchanged right hydroureteronephrosis upstream from the area mid ureteral obstruction secondary to the retroperitoneal mass. 4. Gas is present within the right renal collecting systems, ureter, and bladder. There is no free fluid in the abdomen or pelvis to suggest urine leak however ureteral injury is difficult to exclude. 5. Small locules of free intraperitoneal air in the upper abdomen due to laparoscopic biopsy earlier today. Electronically  Signed   By: Minerva Fester M.D.   On: 01/28/2023 21:25        Scheduled Meds:  amLODipine  10 mg Oral Daily   aspirin EC  81 mg Oral Daily   enoxaparin (LOVENOX) injection  55 mg Subcutaneous Q24H   ezetimibe  10 mg Oral Daily   gabapentin  600 mg Oral TID   insulin aspart  0-9 Units Subcutaneous Q6H   ketorolac  30 mg Intravenous Q6H   linagliptin  5 mg Oral Daily   metoprolol succinate  25 mg Oral Daily   pantoprazole  40 mg Oral Daily   QUEtiapine  50 mg Oral QHS   rosuvastatin  40 mg Oral Daily   tamsulosin  0.4 mg Oral Daily   Continuous Infusions:  sodium chloride 100 mL/hr at 01/29/23 0500   acetaminophen 1,000 mg (01/29/23 1319)     LOS: 1 day     Tresa Moore, MD [facility]   If 7PM-7AM,  please contact night-coverage  01/29/2023, 1:43 PM

## 2023-01-29 NOTE — Progress Notes (Signed)
PHARMACIST - PHYSICIAN COMMUNICATION  CONCERNING:  Enoxaparin (Lovenox) for DVT Prophylaxis    RECOMMENDATION: Patient was prescribed enoxaprin 40mg  q24 hours for VTE prophylaxis.   There were no vitals filed for this visit.  There is no height or weight on file to calculate BMI.  Estimated Creatinine Clearance: 96.8 mL/min (A) (by C-G formula based on SCr of 1.31 mg/dL (H)).   Based on [facility] policy patient is candidate for enoxaparin 0.5mg /kg TBW SQ every 24 hours based on BMI being >30.  DESCRIPTION: Pharmacy has adjusted enoxaparin dose per [facility] policy.  Patient is now receiving enoxaparin 0.5 mg/kg every 24 hours   Otelia Sergeant, PharmD, [facility] 01/29/2023 12:44 AM

## 2023-01-29 NOTE — Progress Notes (Signed)
Patient arrived to unit around 0230, accompanied by nurse and significant other. Attached to patient was NS at gravity. Skin assessment done, orders reviewed. Patient oriented to room and admission navigators started. NGT placed for SBO. Advanced to 64cm via R Nare at 0700. Secure chat MD for portable xray to confirm placement. Patient anxious and c/o pain in abdomen. Hydromorphone given. Pt emesis x1. Compazine given x1, Zofran given x1 with relief of symptoms. VSS. Care ongoing.

## 2023-01-29 NOTE — Progress Notes (Signed)
Transition of Care [facility]) - Inpatient Brief Assessment   Patient Details  Name: Elizeo Kahler MRN: [id number] Date of Birth: 05/12/1976  Transition of Care [facility]) CM/SW Contact:    Darolyn Rua, LCSW Phone Number: 01/29/2023, 9:39 AM   Clinical Narrative:  Patient presents from home to ED with nausea, vomiting and abdominal pain after surgical procedure to biopsy retroperitoneal mass.   PCP Dr. [name] Insurance: [employer]  Please consult TOC should discharge planning needs arise.   Transition of Care Asessment:   Patient has primary care physician: Yes Home environment has been reviewed: from home Prior level of function:: independent Prior/Current Home Services: No current home services Social Determinants of Health Reivew: SDOH reviewed no interventions necessary Readmission risk has been reviewed: Yes Transition of care needs: no transition of care needs at this time

## 2023-01-29 NOTE — Assessment & Plan Note (Signed)
-   The patient will be admitted to a medical bed. - He will be kept NPO except for medications. - NG tube will be inserted. - General Surgery consult will be obtained. - Dr. Aleen Campi was notified about the patient. - We will repeat two-view abdomen x-ray in a.m.

## 2023-01-29 NOTE — Assessment & Plan Note (Addendum)
-   The patient will be placed on supplemental coverage with NovoLog. - We will continue Januvia and hold off glipizide ER. - We will continue Neurontin.

## 2023-01-29 NOTE — Assessment & Plan Note (Signed)
-   She had a laparoscopic biopsy of the result of which is currently pending.

## 2023-01-29 NOTE — Assessment & Plan Note (Signed)
-   We will continue his antihypertensives. 

## 2023-01-29 NOTE — Assessment & Plan Note (Signed)
-   We will continue PPI therapy 

## 2023-01-29 NOTE — Assessment & Plan Note (Addendum)
-   This is likely prerenal due to recurrent nausea and vomiting. - Will hydrate with IV normal saline and follow BMP. - We will avoid nephrotoxins.

## 2023-01-29 NOTE — Consult Note (Signed)
Urology Consult  I have been asked to see the patient by Dr. Marisa Severin, for evaluation and management of gas within right renal collecting system, ureter, bladder.  Chief Complaint: Abdominal pain, nausea, weakness  History of Present Illness: [name] [name] is a 47 y.o. year old male POD 1 from robotic assisted laparoscopic retroperitoneal mass excision with Dr. Everlene Farrier who was readmitted overnight with SBO.  Intraoperatively, Dr. Richardo Hanks performs a cystoscopy with right ureteral catheterization and instillation of ureteral ICG.  CTAP with contrast on admission showed gas within the right renal collecting system, ureter, and bladder.  Urology was consulted to determine if this was an anticipated postoperative finding.  UA has been ordered but not yet collected.  He is lying in bed today with NG tube in place.  He feels some mild urge to void.  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   Sleep apnea     Past Surgical History:  Procedure Laterality Date   ABSCESS DRAINAGE Right 2003   right arm   COLONOSCOPY WITH PROPOFOL N/A 10/10/2020   Procedure: COLONOSCOPY WITH PROPOFOL;  Surgeon: [name], MD;  Location: [facility] ENDOSCOPY;  Service: Gastroenterology;  Laterality: N/A;   CORONARY STENT PLACEMENT  10/2010   LAD   CYSTOSCOPY  01/28/2023   Procedure: CYSTOSCOPY;  Surgeon: Sondra Come, MD;  Location: [facility] ORS;  Service: Urology;;    XI ROBOTIC ASSISTED LOWER ANTERIOR RESECTION N/A 01/28/2023   Procedure: XI ROBOTIC ASSISTED RETROPERITONEAL MASS EXCISION;  Surgeon: Leafy Ro, MD;  Location: [facility] ORS;  Service: General;  Laterality: N/A;    Home Medications:  Current Meds  Medication Sig   amLODipine (NORVASC) 10 MG tablet TAKE 1 TABLET(10 MG) BY MOUTH DAILY   cyclobenzaprine (FLEXERIL) 10 MG tablet Take 10 mg by mouth 3 (three) times daily as needed.   ezetimibe (ZETIA) 10 MG tablet Take 1 tablet (10 mg total) by mouth daily.   methocarbamol (ROBAXIN) 750 MG tablet Take 1 tablet (750 mg total) by mouth every 8 (eight) hours as needed for muscle spasms.   metoprolol succinate (TOPROL-XL) 25 MG 24 hr tablet Take 1 tablet (25 mg total) by mouth daily.   oxyCODONE (OXY IR/ROXICODONE) 5 MG immediate release tablet Take 1-2 tablets (5-10 mg total) by mouth every 4 (four) hours as needed for severe pain.   pantoprazole (PROTONIX) 40 MG tablet TAKE 1 TABLET(40 MG) BY MOUTH DAILY   rosuvastatin (CRESTOR) 40 MG tablet TAKE 1 TABLET(40 MG) BY MOUTH DAILY   tamsulosin (FLOMAX) 0.4 MG CAPS capsule TAKE 2 CAPSULES(0.8 MG) BY MOUTH DAILY   traMADol (ULTRAM) 50 MG tablet Take 50 mg by mouth 2 (two) times daily as needed.    Allergies:  Allergies  Allergen Reactions   Penicillins Hives   Trulicity [Dulaglutide] Diarrhea and Nausea And Vomiting  Family History  Problem Relation Age of Onset   Hypertension Mother    Diabetes Mother    Breast cancer Mother    Multiple myeloma Mother    Hypertension Father    Diabetes Father    Heart disease Father        cabg 3; 58; 35 stents   Hypertension Sister    Heart disease Paternal Uncle    Heart disease Paternal Grandmother     Social History:  reports that he quit smoking about 5 years ago. His smoking use included cigarettes. He started smoking about 19 years ago. He has a 14 pack-year smoking history. He has been exposed to tobacco smoke. He has never used smokeless  tobacco. He reports that he does not currently use alcohol after a past usage of about 10.0 standard drinks of alcohol per week. He reports current drug use. Frequency: 20.00 times per week. Drug: Marijuana.  ROS: A complete review of systems was performed.  All systems are negative except for pertinent findings as noted.  Physical Exam:  Vital signs in last 24 hours: Temp:  [97.9 F (36.6 C)-99.1 F (37.3 C)] 99 F (37.2 C) (08/23 0813) Pulse Rate:  [68-105] 94 (08/23 0813) Resp:  [13-22] 16 (08/23 0813) BP: (106-158)/(59-96) 129/78 (08/23 0813) SpO2:  [96 %-100 %] 100 % (08/23 0813) Weight:  [113.9 kg] 113.9 kg (08/23 0257) Constitutional:  Alert and oriented, uncomfortable appearing HEENT: [place] AT, moist mucus membranes Cardiovascular: No clubbing, cyanosis, or edema Respiratory: Normal respiratory effort Skin: No rashes, bruises or suspicious lesions Neurologic: Grossly intact, no focal deficits, moving all 4 extremities Psychiatric: Normal mood and affect  Laboratory Data:  Recent Labs    01/28/23 1922 01/29/23 0423  WBC 8.1 10.1  HGB 13.8 12.3*  HCT 41.5 35.5*   Recent Labs    01/28/23 1922 01/29/23 0423  NA 137 143  K 4.8 4.2  CL 101 107  CO2 23 23  GLUCOSE 183* 121*  BUN 16 15  CREATININE 1.31* 1.13  CALCIUM 9.9 9.8   No results for input(s): "LABPT", "INR" in the last 72 hours. No results for input(s): "LABURIN" in the last 72 hours. Urinalysis    Component Value Date/Time   COLORURINE YELLOW (A) 12/20/2019 2103   APPEARANCEUR Clear 09/24/2022 1349   LABSPEC 1.024 12/20/2019 2103   PHURINE 5.0 12/20/2019 2103   GLUCOSEU 1+ (A) 09/24/2022 1349   HGBUR NEGATIVE 12/20/2019 2103   BILIRUBINUR Negative 09/24/2022 1349   KETONESUR NEGATIVE 12/20/2019 2103   PROTEINUR Negative 09/24/2022 1349   PROTEINUR NEGATIVE 12/20/2019 2103   UROBILINOGEN 1.0 10/18/2010 0150   NITRITE Negative 09/24/2022 1349   NITRITE NEGATIVE 12/20/2019 2103   LEUKOCYTESUR  Negative 09/24/2022 1349   LEUKOCYTESUR NEGATIVE 12/20/2019 2103   Results for orders placed or performed during the hospital encounter of 03/23/18  Group A Strep by PCR     Status: None   Collection Time: 03/23/18  8:04 AM   Specimen: Throat; Sterile Swab  Result Value Ref Range Status   Group A Strep by PCR NOT DETECTED NOT DETECTED Final    Comment: Performed at [facility], [address] [place], [place] [address]    Radiologic Imaging: DG Abd 1 View  Result Date: 01/29/2023 CLINICAL DATA:  Small-bowel obstruction, enteric catheter EXAM: ABDOMEN - 1 VIEW COMPARISON:  01/29/2023 FINDINGS: Frontal view of the lower chest and upper abdomen demonstrates repositioning of the enteric catheter, tip projecting over the gastric antrum. Side port  projects approximately 2 cm below the gastroesophageal junction. Bowel gas pattern is stable. Lung bases are clear. IMPRESSION: 1. Slight repositioning of the enteric catheter, side port projecting approximately 2 cm below the gastroesophageal junction and tip projecting over the gastric antrum. Electronically Signed   By: Sharlet Salina M.D.   On: 01/29/2023 08:52   DG Abd 1 View  Result Date: 01/29/2023 CLINICAL DATA:  [id number], feeding tube placement. EXAM: ABDOMEN - 1 VIEW COMPARISON:  Earlier study today at 5:05 a.m. FINDINGS: 5:49 a.m. NGT has been only slightly advanced. The tip is within the stomach with side hole is just below the hiatus and could be advanced further in for optimal placement. Mild small bowel dilatation in the upper abdomen continues to be seen. The bowel pattern is unchanged. There is no semi-erect evidence of free air.  Lung bases are clear. No pathologic calcification is evident with only the upper to mid abdomen included in the exam. IMPRESSION: NGT has been only slightly advanced. The tip is within the stomach with the side hole is just below the hiatus and could be advanced further in for optimal placement.  Electronically Signed   By: Almira Bar M.D.   On: 01/29/2023 06:29   DG Abd Portable 1V  Result Date: 01/29/2023 CLINICAL DATA:  47 year old male feeding tube placement. EXAM: PORTABLE ABDOMEN - 1 VIEW COMPARISON:  CT Abdomen and Pelvis 01/28/2023. FINDINGS: AP view at 0505 hours. Enteric tube tip is at the level of the distal gastric fundus. But the side hole projects in the midline at the level of the GEJ. Lung base appears stable. Bowel-gas pattern is nonobstructed. Stable visualized osseous structures. IMPRESSION: Enteric tube placed just inside the stomach, side hole still at the level of the GEJ. Advance 5 cm to ensure side hole placement within the stomach. Electronically Signed   By: Odessa Fleming M.D.   On: 01/29/2023 05:20   CT ABDOMEN PELVIS W CONTRAST  Result Date: 01/28/2023 CLINICAL DATA:  Pt to ED via [facility] c.o post op problem. Pt was discharged from same day surgery around 4:30pm, pt had laparoscopic biopsy done. Pt endorses abd pain, nausea, and weakness. Denies CP, SOB, fevers, dizziness EXAM: CT ABDOMEN AND PELVIS WITH CONTRAST TECHNIQUE: Multidetector CT imaging of the abdomen and pelvis was performed using the standard protocol following bolus administration of intravenous contrast. RADIATION DOSE REDUCTION: This exam was performed according to the departmental dose-optimization program which includes automated exposure control, adjustment of the mA and/or kV according to patient size and/or use of iterative reconstruction technique. CONTRAST:  OMNIPAQUE IOHEXOL 300 MG/ML  SOLN COMPARISON:  PET/CT 12/15/2022 and CT abdomen and pelvis 11/25/2022 FINDINGS: Lower chest: No acute abnormality. Hepatobiliary: No focal liver abnormality is seen. No gallstones, gallbladder wall thickening, or biliary dilatation. Pancreas: Unremarkable. Spleen: Unremarkable. Adrenals/Urinary Tract: Normal adrenal glands. Delayed right nephrogram. Gas is present within the right renal collecting systems,  ureter, and bladder. Unchanged mild right hydroureteronephrosis upstream from the area of occlusion in the midportion of the right ureter secondary to the retroperitoneal mass. Unremarkable left kidney and ureter. Stomach/Bowel: Normal caliber colon. There is mild fluid-filled distention of the small bowel with relatively tapering of the small bowel in the right lower quadrant with associated fecalization of small bowel contents anterior to the retroperitoneum mass (circa series 2/image [id number]). Findings suggest early or partial obstruction. Stomach and appendix are within normal limits. Vascular/Lymphatic: Retroperitoneal mass about the aortic bifurcation extending along the right common iliac vessels. The right  common and internal iliac arteries are narrowed by the mass. The veins are not well evaluated given contrast timing. No lymphadenopathy. Reproductive: Unremarkable. Other: Retroperitoneal mass centered about the aortic bifurcation is slightly increased in size measuring 4.2 x 2.6 cm today, previously 3.4 x 2.3 cm using similar measuring technique. Small locules of free intraperitoneal air in the upper abdomen likely due to laparoscopic biopsy earlier today. No free fluid in the abdomen or pelvis. No hematoma. Musculoskeletal: No acute fracture.  No destructive osseous lesion. IMPRESSION: 1. Retroperitoneal mass centered about the aortic bifurcation is slightly increased in size measuring 4.2 x 2.6 cm today, previously 3.4 x 2.3 cm using similar measuring technique. 2. Distended small bowel with tapering of the small bowel in the right lower quadrant and associated fecalization of small bowel contents anterior to the retroperitoneal mass. Findings suggest early or partial obstruction. 3. Unchanged right hydroureteronephrosis upstream from the area mid ureteral obstruction secondary to the retroperitoneal mass. 4. Gas is present within the right renal collecting systems, ureter, and bladder. There is no free  fluid in the abdomen or pelvis to suggest urine leak however ureteral injury is difficult to exclude. 5. Small locules of free intraperitoneal air in the upper abdomen due to laparoscopic biopsy earlier today. Electronically Signed   By: Minerva Fester M.D.   On: 01/28/2023 21:25    Assessment & Plan:  47 year old male readmitted with SBO after undergoing robotic assisted laparoscopic retroperitoneal mass excision with IntraOp cystoscopy with right ureteral catheterization and instillation of ureteral ICG.  Admission imaging is notable for gas within the right renal collecting system, ureter, and bladder.  Gas within the right renal collecting system, ureter, and bladder are anticipated given urologic instrumentation intraoperatively yesterday.  Would recommend obtaining ordered UA today to rule out infection, though overall low suspicion for this.  Otherwise, will defer management to general surgery and primary teams.  Thank you for involving me in this patient's care, please page with any further questions or concerns.  Carman Ching, PA-C 01/29/2023 9:07 AM

## 2023-01-30 ENCOUNTER — Inpatient Hospital Stay

## 2023-01-30 DIAGNOSIS — K56609 Unspecified intestinal obstruction, unspecified as to partial versus complete obstruction: Secondary | ICD-10-CM | POA: Diagnosis not present

## 2023-01-30 LAB — GLUCOSE, CAPILLARY
Glucose-Capillary: 116 mg/dL — ABNORMAL HIGH (ref 70–99)
Glucose-Capillary: 121 mg/dL — ABNORMAL HIGH (ref 70–99)
Glucose-Capillary: 126 mg/dL — ABNORMAL HIGH (ref 70–99)
Glucose-Capillary: 132 mg/dL — ABNORMAL HIGH (ref 70–99)
Glucose-Capillary: 87 mg/dL (ref 70–99)
Glucose-Capillary: 97 mg/dL (ref 70–99)

## 2023-01-30 NOTE — Progress Notes (Signed)
PROGRESS NOTE    [name] [name]  OZH:[id number] DOB: January 03, 1976 DOA: 01/28/2023 PCP: Malva Limes, MD    Brief Narrative:  47 y.o. African American male with medical history significant for coronary artery disease, GERD, type 2 diabetes mellitus, hypertension, dyslipidemia and retroperitoneal mass for which she had a laparoscopic biopsy earlier during the day by Dr. Everlene Farrier on with cystoscopy by Dr. Richardo Hanks, who presented to the emergency room with acute onset of recurrent nausea and vomiting after going home with diffuse abdominal pain.  He denies any diarrhea.  No bilious vomitus or hematemesis.  He admits to chills but did not have any measured fever.  No chest pain or palpitations.  No cough or wheezing or dyspnea.     Assessment & Plan:   Principal Problem:   Small bowel obstruction (HCC) Active Problems:   Type 2 diabetes mellitus with peripheral neuropathy (HCC)   Retroperitoneal mass   AKI (acute kidney injury) (HCC)   Essential hypertension   Dyslipidemia   GERD without esophagitis   BPH (benign prostatic hyperplasia) Ileus Seen in consultation by general surgery.  No surgical intervention warranted at this time.  NGT placed on admission Plan: DC NGT Clear liquids Monitor Minimize use of narcotics Possible advancement of diet to soft, pending surgery recs   Type 2 diabetes mellitus with peripheral neuropathy (HCC) SSI, Januvia, carb modified diet when advanced Okay for Neurontin   AKI (acute kidney injury) (HCC) Suspect prerenal due to recurrent nausea and vomiting.   Improving Continue IVF for now   Retroperitoneal mass Status post laparoscopic biopsy.  Results pending.  Outpatient follow-up   Essential hypertension BP controlled   BPH (benign prostatic hyperplasia) PTA Flomax   GERD without esophagitis PTA PPI   Dyslipidemia Statin and Zetia   DVT prophylaxis: SQ Lovenox Code Status: Full Family Communication: Family member at bedside 8/23,  8/24 Disposition Plan: Status is: Inpatient Remains inpatient appropriate because: Ileus.  Anticipate dc tomorrow   Level of care: Med-Surg  Consultants:  General surgery  Procedures:  NGT placement  Antimicrobials: None   Subjective: Seen and examined.  More comfortable appearing today  Objective: Vitals:   01/30/23 0018 01/30/23 0325 01/30/23 0804 01/30/23 1219  BP: 123/80 121/86 (!) 132/95 (!) 136/93  Pulse: 89 94 97 98  Resp: 16 20 20 20   Temp: 97.9 F (36.6 C) 98 F (36.7 C) 98 F (36.7 C) 98.1 F (36.7 C)  TempSrc: Oral  Oral Oral  SpO2: 100% 99% 100% 100%  Weight:      Height:        Intake/Output Summary (Last 24 hours) at 01/30/2023 1453 Last data filed at 01/30/2023 0600 Gross per 24 hour  Intake 2553.11 ml  Output 600 ml  Net 1953.11 ml   Filed Weights   01/29/23 0257  Weight: 113.9 kg    Examination:  General exam: NAD Respiratory system: Clear to auscultation. Respiratory effort normal. Cardiovascular system: S1-2, RRR, no murmurs, no pedal edema Gastrointestinal system: Soft NT/ND, hyperactive BS Central nervous system: Alert and oriented. No focal neurological deficits. Extremities: Symmetric 5 x 5 power. Skin: No rashes, lesions or ulcers Psychiatry: Judgement and insight appear normal. Mood & affect appropriate.     Data Reviewed: I have personally reviewed following labs and imaging studies  CBC: Recent Labs  Lab 01/28/23 1922 01/29/23 0423  WBC 8.1 10.1  NEUTROABS 7.4  --   HGB 13.8 12.3*  HCT 41.5 35.5*  MCV 90.0 87.0  PLT 169 213  Basic Metabolic Panel: Recent Labs  Lab 01/28/23 1922 01/29/23 0423 01/29/23 0557  NA 137 143  --   K 4.8 4.2  --   CL 101 107  --   CO2 23 23  --   GLUCOSE 183* 121*  --   BUN 16 15  --   CREATININE 1.31* 1.13  --   CALCIUM 9.9 9.8  --   MG  --   --  1.8   GFR: Estimated Creatinine Clearance: 112.4 mL/min (by C-G formula based on SCr of 1.13 mg/dL). Liver Function Tests: No  results for input(s): "AST", "ALT", "ALKPHOS", "BILITOT", "PROT", "ALBUMIN" in the last 168 hours. Recent Labs  Lab 01/28/23 1922  LIPASE 44   No results for input(s): "AMMONIA" in the last 168 hours. Coagulation Profile: No results for input(s): "INR", "PROTIME" in the last 168 hours. Cardiac Enzymes: No results for input(s): "CKTOTAL", "CKMB", "CKMBINDEX", "TROPONINI" in the last 168 hours. BNP (last 3 results) No results for input(s): "PROBNP" in the last 8760 hours. HbA1C: No results for input(s): "HGBA1C" in the last 72 hours. CBG: Recent Labs  Lab 01/29/23 1243 01/29/23 1639 01/30/23 0025 01/30/23 0632 01/30/23 1215  GLUCAP 148* 129* 116* 97 121*   Lipid Profile: No results for input(s): "CHOL", "HDL", "LDLCALC", "TRIG", "CHOLHDL", "LDLDIRECT" in the last 72 hours. Thyroid Function Tests: No results for input(s): "TSH", "T4TOTAL", "FREET4", "T3FREE", "THYROIDAB" in the last 72 hours. Anemia Panel: No results for input(s): "VITAMINB12", "FOLATE", "FERRITIN", "TIBC", "IRON", "RETICCTPCT" in the last 72 hours. Sepsis Labs: Recent Labs  Lab 01/28/23 1945 01/28/23 2127 01/29/23 0557 01/29/23 0847  LATICACIDVEN 2.0* 2.7* 1.2 1.9    No results found for this or any previous visit (from the past 240 hour(s)).       Radiology Studies: DG ABD ACUTE 2+V W 1V CHEST  Result Date: 01/30/2023 CLINICAL DATA:  Provided history: Ileus following gastrointestinal surgery. EXAM: DG ABDOMEN ACUTE WITH 1 VIEW CHEST COMPARISON:  Abdominal radiograph 01/29/2023. FINDINGS: The enteric tube terminates in the expected location of the gastric body. Heart size within normal limits. Bandlike opacity within the left lung base with an appearance most consistent with atelectasis or scarring. No appreciable airspace consolidation. No evidence of pleural effusion or pneumothorax. Nonspecific gaseous distention of small bowel without frank small bowel dilation. Gas is present within the rectum. No  acute osseous abnormality identified. IMPRESSION: 1. The enteric tube terminates in the expected location of the gastric body. 2. Nonspecific gaseous distension of small bowel without frank small bowel dilation. Gas is present within the rectum. 3. Bandlike opacity within the left lung base with an appearance most consistent with atelectasis or scarring. Electronically Signed   By: Jackey Loge D.O.   On: 01/30/2023 11:40   DG Abd 1 View  Result Date: 01/29/2023 CLINICAL DATA:  Small-bowel obstruction, enteric catheter EXAM: ABDOMEN - 1 VIEW COMPARISON:  01/29/2023 FINDINGS: Frontal view of the lower chest and upper abdomen demonstrates repositioning of the enteric catheter, tip projecting over the gastric antrum. Side port projects approximately 2 cm below the gastroesophageal junction. Bowel gas pattern is stable. Lung bases are clear. IMPRESSION: 1. Slight repositioning of the enteric catheter, side port projecting approximately 2 cm below the gastroesophageal junction and tip projecting over the gastric antrum. Electronically Signed   By: Sharlet Salina M.D.   On: 01/29/2023 08:52   DG Abd 1 View  Result Date: 01/29/2023 CLINICAL DATA:  [id number], feeding tube placement. EXAM: ABDOMEN - 1 VIEW COMPARISON:  Earlier study today at 5:05 a.m. FINDINGS: 5:49 a.m. NGT has been only slightly advanced. The tip is within the stomach with side hole is just below the hiatus and could be advanced further in for optimal placement. Mild small bowel dilatation in the upper abdomen continues to be seen. The bowel pattern is unchanged. There is no semi-erect evidence of free air.  Lung bases are clear. No pathologic calcification is evident with only the upper to mid abdomen included in the exam. IMPRESSION: NGT has been only slightly advanced. The tip is within the stomach with the side hole is just below the hiatus and could be advanced further in for optimal placement. Electronically Signed   By: Almira Bar M.D.    On: 01/29/2023 06:29   DG Abd Portable 1V  Result Date: 01/29/2023 CLINICAL DATA:  47 year old male feeding tube placement. EXAM: PORTABLE ABDOMEN - 1 VIEW COMPARISON:  CT Abdomen and Pelvis 01/28/2023. FINDINGS: AP view at 0505 hours. Enteric tube tip is at the level of the distal gastric fundus. But the side hole projects in the midline at the level of the GEJ. Lung base appears stable. Bowel-gas pattern is nonobstructed. Stable visualized osseous structures. IMPRESSION: Enteric tube placed just inside the stomach, side hole still at the level of the GEJ. Advance 5 cm to ensure side hole placement within the stomach. Electronically Signed   By: Odessa Fleming M.D.   On: 01/29/2023 05:20   CT ABDOMEN PELVIS W CONTRAST  Result Date: 01/28/2023 CLINICAL DATA:  Pt to ED via [facility] c.o post op problem. Pt was discharged from same day surgery around 4:30pm, pt had laparoscopic biopsy done. Pt endorses abd pain, nausea, and weakness. Denies CP, SOB, fevers, dizziness EXAM: CT ABDOMEN AND PELVIS WITH CONTRAST TECHNIQUE: Multidetector CT imaging of the abdomen and pelvis was performed using the standard protocol following bolus administration of intravenous contrast. RADIATION DOSE REDUCTION: This exam was performed according to the departmental dose-optimization program which includes automated exposure control, adjustment of the mA and/or kV according to patient size and/or use of iterative reconstruction technique. CONTRAST:  OMNIPAQUE IOHEXOL 300 MG/ML  SOLN COMPARISON:  PET/CT 12/15/2022 and CT abdomen and pelvis 11/25/2022 FINDINGS: Lower chest: No acute abnormality. Hepatobiliary: No focal liver abnormality is seen. No gallstones, gallbladder wall thickening, or biliary dilatation. Pancreas: Unremarkable. Spleen: Unremarkable. Adrenals/Urinary Tract: Normal adrenal glands. Delayed right nephrogram. Gas is present within the right renal collecting systems, ureter, and bladder. Unchanged mild right  hydroureteronephrosis upstream from the area of occlusion in the midportion of the right ureter secondary to the retroperitoneal mass. Unremarkable left kidney and ureter. Stomach/Bowel: Normal caliber colon. There is mild fluid-filled distention of the small bowel with relatively tapering of the small bowel in the right lower quadrant with associated fecalization of small bowel contents anterior to the retroperitoneum mass (circa series 2/image 62-73). Findings suggest early or partial obstruction. Stomach and appendix are within normal limits. Vascular/Lymphatic: Retroperitoneal mass about the aortic bifurcation extending along the right common iliac vessels. The right common and internal iliac arteries are narrowed by the mass. The veins are not well evaluated given contrast timing. No lymphadenopathy. Reproductive: Unremarkable. Other: Retroperitoneal mass centered about the aortic bifurcation is slightly increased in size measuring 4.2 x 2.6 cm today, previously 3.4 x 2.3 cm using similar measuring technique. Small locules of free intraperitoneal air in the upper abdomen likely due to laparoscopic biopsy earlier today. No free fluid in the abdomen or pelvis. No hematoma. Musculoskeletal: No  acute fracture.  No destructive osseous lesion. IMPRESSION: 1. Retroperitoneal mass centered about the aortic bifurcation is slightly increased in size measuring 4.2 x 2.6 cm today, previously 3.4 x 2.3 cm using similar measuring technique. 2. Distended small bowel with tapering of the small bowel in the right lower quadrant and associated fecalization of small bowel contents anterior to the retroperitoneal mass. Findings suggest early or partial obstruction. 3. Unchanged right hydroureteronephrosis upstream from the area mid ureteral obstruction secondary to the retroperitoneal mass. 4. Gas is present within the right renal collecting systems, ureter, and bladder. There is no free fluid in the abdomen or pelvis to suggest  urine leak however ureteral injury is difficult to exclude. 5. Small locules of free intraperitoneal air in the upper abdomen due to laparoscopic biopsy earlier today. Electronically Signed   By: Minerva Fester M.D.   On: 01/28/2023 21:25        Scheduled Meds:  amLODipine  10 mg Oral Daily   aspirin EC  81 mg Oral Daily   enoxaparin (LOVENOX) injection  55 mg Subcutaneous Q24H   ezetimibe  10 mg Oral Daily   gabapentin  600 mg Oral TID   insulin aspart  0-9 Units Subcutaneous Q6H   ketorolac  30 mg Intravenous Q6H   linagliptin  5 mg Oral Daily   metoprolol succinate  25 mg Oral Daily   pantoprazole  40 mg Oral Daily   QUEtiapine  50 mg Oral QHS   rosuvastatin  40 mg Oral Daily   tamsulosin  0.4 mg Oral Daily   Continuous Infusions:  sodium chloride 100 mL/hr at 01/30/23 0600     LOS: 2 days     Tresa Moore, MD [facility]   If 7PM-7AM, please contact night-coverage  01/30/2023, 2:53 PM

## 2023-01-30 NOTE — Progress Notes (Signed)
Subjective:  CC: [name] [name] is a 47 y.o. male  Hospital stay day 2,   ileus  HPI: No acute issues overnight.  Continues to pass flatus but no BM.  Pain has decreased as well  ROS:  General: Denies weight loss, weight gain, fatigue, fevers, chills, and night sweats. Heart: Denies chest pain, palpitations, racing heart, irregular heartbeat, leg pain or swelling, and decreased activity tolerance. Respiratory: Denies breathing difficulty, shortness of breath, wheezing, cough, and sputum. GI: Denies change in appetite, heartburn, nausea, vomiting, constipation, diarrhea, and blood in stool. GU: Denies difficulty urinating, pain with urinating, urgency, frequency, blood in urine.   Objective:   Temp:  [97.6 F (36.4 C)-98.5 F (36.9 C)] 98.1 F (36.7 C) (08/24 1219) Pulse Rate:  [78-98] 98 (08/24 1219) Resp:  [16-20] 20 (08/24 1219) BP: (121-143)/(80-95) 136/93 (08/24 1219) SpO2:  [98 %-100 %] 100 % (08/24 1219)     Height: 6\' 3"  (190.5 cm) Weight: 113.9 kg BMI (Calculated): 31.4   Intake/Output this shift:   Intake/Output Summary (Last 24 hours) at 01/30/2023 1306 Last data filed at 01/30/2023 0600 Gross per 24 hour  Intake 2553.11 ml  Output 600 ml  Net 1953.11 ml    Constitutional :  alert, cooperative, appears stated age, and no distress  Respiratory:  clear to auscultation bilaterally  Cardiovascular:  regular rate and rhythm  Gastrointestinal: Soft, minimal distention, no tenderness to palpation .   Skin: Cool and moist.  Psychiatric: Normal affect, non-agitated, not confused       LABS:     Latest Ref Rng & Units 01/29/2023    4:23 AM 01/28/2023    7:22 PM 11/25/2022    6:31 PM  CMP  Glucose 70 - 99 mg/dL 696  295  284   BUN 6 - 20 mg/dL 15  16  14    Creatinine 0.61 - 1.24 mg/dL 1.32  4.40  1.02   Sodium 135 - 145 mmol/L 143  137  140   Potassium 3.5 - 5.1 mmol/L 4.2  4.8  3.4   Chloride 98 - 111 mmol/L 107  101  109   CO2 22 - 32 mmol/L 23  23  16    Calcium  8.9 - 10.3 mg/dL 9.8  9.9  9.5   Total Protein 6.5 - 8.1 g/dL   8.7   Total Bilirubin 0.3 - 1.2 mg/dL   0.9   Alkaline Phos 38 - 126 U/L   52   AST 15 - 41 U/L   20   ALT 0 - 44 U/L   20       Latest Ref Rng & Units 01/29/2023    4:23 AM 01/28/2023    7:22 PM 12/01/2022   10:03 AM  CBC  WBC 4.0 - 10.5 K/uL 10.1  8.1  5.4   Hemoglobin 13.0 - 17.0 g/dL 72.5  36.6  44.0   Hematocrit 39.0 - 52.0 % 35.5  41.5  39.8   Platelets 150 - 400 K/uL 213  169  253     RADS: CLINICAL DATA:  Provided history: Ileus following gastrointestinal surgery.   EXAM: DG ABDOMEN ACUTE WITH 1 VIEW CHEST   COMPARISON:  Abdominal radiograph 01/29/2023.   FINDINGS: The enteric tube terminates in the expected location of the gastric body. Heart size within normal limits. Bandlike opacity within the left lung base with an appearance most consistent with atelectasis or scarring. No appreciable airspace consolidation. No evidence of pleural effusion or pneumothorax. Nonspecific gaseous distention  of small bowel without frank small bowel dilation. Gas is present within the rectum. No acute osseous abnormality identified.   IMPRESSION: 1. The enteric tube terminates in the expected location of the gastric body. 2. Nonspecific gaseous distension of small bowel without frank small bowel dilation. Gas is present within the rectum. 3. Bandlike opacity within the left lung base with an appearance most consistent with atelectasis or scarring.     Electronically Signed   By: Jackey Loge D.O.   On: 01/30/2023 11:40 Assessment:   Ileus.  Passed clamp trial this a.m. and imaging reassuring.  Will remove NG tube and start clear liquid diet and continue to monitor.  labs/images/medications/previous chart entries reviewed personally and relevant changes/updates noted above.

## 2023-01-31 DIAGNOSIS — K56609 Unspecified intestinal obstruction, unspecified as to partial versus complete obstruction: Secondary | ICD-10-CM | POA: Diagnosis not present

## 2023-01-31 LAB — GLUCOSE, CAPILLARY
Glucose-Capillary: 82 mg/dL (ref 70–99)
Glucose-Capillary: 129 mg/dL — ABNORMAL HIGH (ref 70–99)
Glucose-Capillary: 119 mg/dL — ABNORMAL HIGH (ref 70–99)
Glucose-Capillary: 119 mg/dL — ABNORMAL HIGH (ref 70–99)

## 2023-01-31 NOTE — Progress Notes (Signed)
PROGRESS NOTE    [name] [name]  ZOX:[id number] DOB: [birth year] DOA: 01/28/2023 PCP: Malva Limes, MD    Brief Narrative:  47 y.o. African American male with medical history significant for coronary artery disease, GERD, type 2 diabetes mellitus, hypertension, dyslipidemia and retroperitoneal mass for which she had a laparoscopic biopsy earlier during the day by Dr. Everlene Farrier on with cystoscopy by Dr. Richardo Hanks, who presented to the emergency room with acute onset of recurrent nausea and vomiting after going home with diffuse abdominal pain.  He denies any diarrhea.  No bilious vomitus or hematemesis.  He admits to chills but did not have any measured fever.  No chest pain or palpitations.  No cough or wheezing or dyspnea.     Assessment & Plan:   Principal Problem:   Small bowel obstruction (HCC) Active Problems:   Type 2 diabetes mellitus with peripheral neuropathy (HCC)   Retroperitoneal mass   AKI (acute kidney injury) (HCC)   Essential hypertension   Dyslipidemia   GERD without esophagitis   BPH (benign prostatic hyperplasia)  Ileus Seen in consultation by general surgery.  No surgical intervention warranted at this time.  NGT placed on admission NGT removed Passing flatus, no BM Plan: Full liquids Monitor Minimize use of narcotics DC once patient has BM   Type 2 diabetes mellitus with peripheral neuropathy (HCC) SSI, Januvia, carb modified diet when advanced Okay for Neurontin   AKI (acute kidney injury) (HCC) Suspect prerenal due to recurrent nausea and vomiting.   Improving Continue IVF for now   Retroperitoneal mass Status post laparoscopic biopsy.  Results pending.  Outpatient follow-up   Essential hypertension BP controlled   BPH (benign prostatic hyperplasia) PTA Flomax   GERD without esophagitis PTA PPI   Dyslipidemia Statin and Zetia   DVT prophylaxis: SQ Lovenox Code Status: Full Family Communication: Family member at bedside 8/23, 8/24,  8/25 Disposition Plan: Status is: Inpatient Remains inpatient appropriate because: Ileus.  Anticipate dc tomorrow   Level of care: Med-Surg  Consultants:  General surgery  Procedures:  NGT placement  Antimicrobials: None   Subjective: Seen and examined.  No acute events.  No BM yet  Objective: Vitals:   01/30/23 1942 01/30/23 2332 01/31/23 0403 01/31/23 0737  BP: 112/71 120/79 (!) 131/92 126/83  Pulse: 87 82 82 81  Resp: 16 18 18    Temp: 98.4 F (36.9 C) 98.4 F (36.9 C) 98.6 F (37 C) 100.2 F (37.9 C)  TempSrc:  Oral Oral Oral  SpO2: 96% 99% 97% 98%  Weight:      Height:        Intake/Output Summary (Last 24 hours) at 01/31/2023 1715 Last data filed at 01/31/2023 1137 Gross per 24 hour  Intake 1347.06 ml  Output 775 ml  Net 572.06 ml   Filed Weights   01/29/23 0257  Weight: 113.9 kg    Examination:  General exam: No acute distress Respiratory system: Clear to auscultation. Respiratory effort normal. Cardiovascular system: S1-2, RRR, no murmurs, no pedal edema Gastrointestinal system: Soft NT/ND, hyperactive BS Central nervous system: Alert and oriented. No focal neurological deficits. Extremities: Symmetric 5 x 5 power. Skin: No rashes, lesions or ulcers Psychiatry: Judgement and insight appear normal. Mood & affect appropriate.     Data Reviewed: I have personally reviewed following labs and imaging studies  CBC: Recent Labs  Lab 01/28/23 1922 01/29/23 0423  WBC 8.1 10.1  NEUTROABS 7.4  --   HGB 13.8 12.3*  HCT 41.5 35.5*  MCV  90.0 87.0  PLT 169 213   Basic Metabolic Panel: Recent Labs  Lab 01/28/23 1922 01/29/23 0423 01/29/23 0557  NA 137 143  --   K 4.8 4.2  --   CL 101 107  --   CO2 23 23  --   GLUCOSE 183* 121*  --   BUN 16 15  --   CREATININE 1.31* 1.13  --   CALCIUM 9.9 9.8  --   MG  --   --  1.8   GFR: Estimated Creatinine Clearance: 112.4 mL/min (by C-G formula based on SCr of 1.13 mg/dL). Liver Function Tests: No  results for input(s): "AST", "ALT", "ALKPHOS", "BILITOT", "PROT", "ALBUMIN" in the last 168 hours. Recent Labs  Lab 01/28/23 1922  LIPASE 44   No results for input(s): "AMMONIA" in the last 168 hours. Coagulation Profile: No results for input(s): "INR", "PROTIME" in the last 168 hours. Cardiac Enzymes: No results for input(s): "CKTOTAL", "CKMB", "CKMBINDEX", "TROPONINI" in the last 168 hours. BNP (last 3 results) No results for input(s): "PROBNP" in the last 8760 hours. HbA1C: No results for input(s): "HGBA1C" in the last 72 hours. CBG: Recent Labs  Lab 01/30/23 1646 01/30/23 2025 01/30/23 2307 01/31/23 0416 01/31/23 1228  GLUCAP 126* 132* 87 82 129*   Lipid Profile: No results for input(s): "CHOL", "HDL", "LDLCALC", "TRIG", "CHOLHDL", "LDLDIRECT" in the last 72 hours. Thyroid Function Tests: No results for input(s): "TSH", "T4TOTAL", "FREET4", "T3FREE", "THYROIDAB" in the last 72 hours. Anemia Panel: No results for input(s): "VITAMINB12", "FOLATE", "FERRITIN", "TIBC", "IRON", "RETICCTPCT" in the last 72 hours. Sepsis Labs: Recent Labs  Lab 01/28/23 1945 01/28/23 2127 01/29/23 0557 01/29/23 0847  LATICACIDVEN 2.0* 2.7* 1.2 1.9    No results found for this or any previous visit (from the past 240 hour(s)).       Radiology Studies: DG ABD ACUTE 2+V W 1V CHEST  Result Date: 01/30/2023 CLINICAL DATA:  Provided history: Ileus following gastrointestinal surgery. EXAM: DG ABDOMEN ACUTE WITH 1 VIEW CHEST COMPARISON:  Abdominal radiograph 01/29/2023. FINDINGS: The enteric tube terminates in the expected location of the gastric body. Heart size within normal limits. Bandlike opacity within the left lung base with an appearance most consistent with atelectasis or scarring. No appreciable airspace consolidation. No evidence of pleural effusion or pneumothorax. Nonspecific gaseous distention of small bowel without frank small bowel dilation. Gas is present within the rectum. No  acute osseous abnormality identified. IMPRESSION: 1. The enteric tube terminates in the expected location of the gastric body. 2. Nonspecific gaseous distension of small bowel without frank small bowel dilation. Gas is present within the rectum. 3. Bandlike opacity within the left lung base with an appearance most consistent with atelectasis or scarring. Electronically Signed   By: Jackey Loge D.O.   On: 01/30/2023 11:40        Scheduled Meds:  amLODipine  10 mg Oral Daily   aspirin EC  81 mg Oral Daily   enoxaparin (LOVENOX) injection  55 mg Subcutaneous Q24H   ezetimibe  10 mg Oral Daily   gabapentin  600 mg Oral TID   insulin aspart  0-9 Units Subcutaneous Q6H   ketorolac  30 mg Intravenous Q6H   linagliptin  5 mg Oral Daily   metoprolol succinate  25 mg Oral Daily   pantoprazole  40 mg Oral Daily   QUEtiapine  50 mg Oral QHS   rosuvastatin  40 mg Oral Daily   tamsulosin  0.4 mg Oral Daily   Continuous  Infusions:     LOS: 3 days     Tresa Moore, MD [facility]   If 7PM-7AM, please contact night-coverage  01/31/2023, 5:15 PM

## 2023-01-31 NOTE — Plan of Care (Signed)

## 2023-01-31 NOTE — Progress Notes (Signed)
Subjective:  CC: [name] [name] is a 47 y.o. male  Hospital stay day 3,   ileus  HPI: No acute issues overnight.  Continues to pass flatus but no BM.  Tolerating clears ROS:  General: Denies weight loss, weight gain, fatigue, fevers, chills, and night sweats. Heart: Denies chest pain, palpitations, racing heart, irregular heartbeat, leg pain or swelling, and decreased activity tolerance. Respiratory: Denies breathing difficulty, shortness of breath, wheezing, cough, and sputum. GI: Denies change in appetite, heartburn, nausea, vomiting, constipation, diarrhea, and blood in stool. GU: Denies difficulty urinating, pain with urinating, urgency, frequency, blood in urine.   Objective:   Temp:  [98 F (36.7 C)-100.2 F (37.9 C)] 100.2 F (37.9 C) (08/25 0737) Pulse Rate:  [75-98] 81 (08/25 0737) Resp:  [16-20] 18 (08/25 0403) BP: (112-147)/(71-93) 126/83 (08/25 0737) SpO2:  [96 %-100 %] 98 % (08/25 0737)     Height: 6\' 3"  (190.5 cm) Weight: 113.9 kg BMI (Calculated): 31.4   Intake/Output this shift:   Intake/Output Summary (Last 24 hours) at 01/31/2023 1133 Last data filed at 01/31/2023 0800 Gross per 24 hour  Intake 780 ml  Output 775 ml  Net 5 ml    Constitutional :  alert, cooperative, appears stated age, and no distress  Respiratory:  clear to auscultation bilaterally  Cardiovascular:  regular rate and rhythm  Gastrointestinal: Soft, no distention, no tenderness to palpation .   Skin: Cool and moist.  Psychiatric: Normal affect, non-agitated, not confused       LABS:     Latest Ref Rng & Units 01/29/2023    4:23 AM 01/28/2023    7:22 PM 11/25/2022    6:31 PM  CMP  Glucose 70 - 99 mg/dL 161  096  045   BUN 6 - 20 mg/dL 15  16  14    Creatinine 0.61 - 1.24 mg/dL 4.09  8.11  9.14   Sodium 135 - 145 mmol/L 143  137  140   Potassium 3.5 - 5.1 mmol/L 4.2  4.8  3.4   Chloride 98 - 111 mmol/L 107  101  109   CO2 22 - 32 mmol/L 23  23  16    Calcium 8.9 - 10.3 mg/dL 9.8  9.9   9.5   Total Protein 6.5 - 8.1 g/dL   8.7   Total Bilirubin 0.3 - 1.2 mg/dL   0.9   Alkaline Phos 38 - 126 U/L   52   AST 15 - 41 U/L   20   ALT 0 - 44 U/L   20       Latest Ref Rng & Units 01/29/2023    4:23 AM 01/28/2023    7:22 PM 12/01/2022   10:03 AM  CBC  WBC 4.0 - 10.5 K/uL 10.1  8.1  5.4   Hemoglobin 13.0 - 17.0 g/dL 78.2  95.6  21.3   Hematocrit 39.0 - 52.0 % 35.5  41.5  39.8   Platelets 150 - 400 K/uL 213  169  253     RADS: CLINICAL DATA:  Provided history: Ileus following gastrointestinal surgery.   EXAM: DG ABDOMEN ACUTE WITH 1 VIEW CHEST   COMPARISON:  Abdominal radiograph 01/29/2023.   FINDINGS: The enteric tube terminates in the expected location of the gastric body. Heart size within normal limits. Bandlike opacity within the left lung base with an appearance most consistent with atelectasis or scarring. No appreciable airspace consolidation. No evidence of pleural effusion or pneumothorax. Nonspecific gaseous distention of small bowel without  frank small bowel dilation. Gas is present within the rectum. No acute osseous abnormality identified.   IMPRESSION: 1. The enteric tube terminates in the expected location of the gastric body. 2. Nonspecific gaseous distension of small bowel without frank small bowel dilation. Gas is present within the rectum. 3. Bandlike opacity within the left lung base with an appearance most consistent with atelectasis or scarring.     Electronically Signed   By: Jackey Loge D.O.   On: 01/30/2023 11:40 Assessment:   Ileus.  Resolving.  Advance to full's and continue to monitor for BM.  labs/images/medications/previous chart entries reviewed personally and relevant changes/updates noted above.

## 2023-02-01 ENCOUNTER — Inpatient Hospital Stay

## 2023-02-01 DIAGNOSIS — K56609 Unspecified intestinal obstruction, unspecified as to partial versus complete obstruction: Secondary | ICD-10-CM | POA: Diagnosis not present

## 2023-02-01 LAB — GLUCOSE, CAPILLARY: Glucose-Capillary: 93 mg/dL (ref 70–99)

## 2023-02-01 MED ORDER — ORAL CARE MOUTH RINSE: 15.0000 mL | OROMUCOSAL | Status: DC | PRN

## 2023-02-01 MED ORDER — POLYETHYLENE GLYCOL 3350 17 G PO PACK: 17.0000 g | PACK | Freq: Two times a day (BID) | ORAL | Status: DC

## 2023-02-01 NOTE — Progress Notes (Signed)
CC: ileus Subjective: Doing much better, + flatus, taking po Kub nml  Objective: Vital signs in last 24 hours: Temp:  [97.9 F (36.6 C)-98.8 F (37.1 C)] 98.8 F (37.1 C) (08/26 0731) Pulse Rate:  [71-79] 79 (08/26 0731) Resp:  [18] 18 (08/26 0731) BP: (120-133)/(79-94) 133/94 (08/26 0731) SpO2:  [99 %] 99 % (08/26 0731) Last BM Date : 01/27/23  Intake/Output from previous day: 08/25 0701 - 08/26 0700 In: 1107.1 [P.O.:540; I.V.:567.1] Out: -  Intake/Output this shift: Total I/O In: 480 [P.O.:480] Out: -   Physical exam:  NAd  Abd: soft, incision healing well, no infection  Lab Results: CBC  No results for input(s): "WBC", "HGB", "HCT", "PLT" in the last 72 hours. BMET No results for input(s): "NA", "K", "CL", "CO2", "GLUCOSE", "BUN", "CREATININE", "CALCIUM" in the last 72 hours. PT/INR No results for input(s): "LABPROT", "INR" in the last 72 hours. ABG No results for input(s): "PHART", "HCO3" in the last 72 hours.  Invalid input(s): "PCO2", "PO2"  Studies/Results: DG Abd 1 View  Result Date: 02/01/2023 CLINICAL DATA:  [id number] Ileus [facility]) [id number] EXAM: ABDOMEN - 1 VIEW COMPARISON:  01/30/2023 FINDINGS: The bowel gas pattern is normal. No radio-opaque calculi or other significant radiographic abnormality are seen. IMPRESSION: Negative. Electronically Signed   By: Corlis Leak M.D.   On: 02/01/2023 10:12    Anti-infectives: Anti-infectives (From admission, onward)    None       Assessment/Plan: Doing well Ok to [employer] home  [place], MD, [facility]  02/01/2023

## 2023-02-01 NOTE — Plan of Care (Signed)

## 2023-02-01 NOTE — Discharge Summary (Signed)
Physician Discharge Summary  [name] [name] UJW:[id number] DOB: 05/21/1976 DOA: 01/28/2023  PCP: Malva Limes, MD  Admit date: 01/28/2023 Discharge date: 02/01/2023  Admitted From: Home Disposition:  Home  Recommendations for Outpatient Follow-up:  Follow up with PCP in 1-2 weeks   Home Health:No Equipment/Devices:None   Discharge Condition:Stable  CODE STATUS:FULL  Diet recommendation: Soft  Brief/Interim Summary:   47 y.o. African American male with medical history significant for coronary artery disease, GERD, type 2 diabetes mellitus, hypertension, dyslipidemia and retroperitoneal mass for which she had a laparoscopic biopsy earlier during the day by Dr. Everlene Farrier on with cystoscopy by Dr. Richardo Hanks, who presented to the emergency room with acute onset of recurrent nausea and vomiting after going home with diffuse abdominal pain.  He denies any diarrhea.  No bilious vomitus or hematemesis.  He admits to chills but did not have any measured fever.  No chest pain or palpitations.  No cough or wheezing or dyspnea.     8/26: Monitor in house over the weekend.  Did well.  NG tube removed.  Patient passing flatus but no BM at time of discharge.  Seen in consultation by general surgery.  Cleared for discharge home at this time.  MiraLAX regimen on DC.   Discharge Diagnoses:  Principal Problem:   Small bowel obstruction (HCC) Active Problems:   Type 2 diabetes mellitus with peripheral neuropathy (HCC)   Retroperitoneal mass   AKI (acute kidney injury) (HCC)   Essential hypertension   Dyslipidemia   GERD without esophagitis   BPH (benign prostatic hyperplasia)  Postoperative ileus Seen in consultation by general surgery.  Conservative management.  NG tube placed on admission.  Subsequently removed.  Patient passing flatus and tolerating soft diet at time of discharge.  No BM yet.  Seen by general surgery on day of discharge and cleared for DC home.  Discharge  Instructions  Discharge Instructions     Diet - low sodium heart healthy   Complete by: As directed    Increase activity slowly   Complete by: As directed       Allergies as of 02/01/2023       Reactions   Penicillins Hives   Trulicity [dulaglutide] Diarrhea, Nausea And Vomiting        Medication List     STOP taking these medications    Lantus SoloStar 100 UNIT/ML Solostar Pen Generic drug: insulin glargine   losartan 25 MG tablet Commonly known as: COZAAR       TAKE these medications    Accu-Chek Softclix Lancets lancets Use to check blood sugar daily for type 2 diabetes. E11.9   amLODipine 10 MG tablet Commonly known as: NORVASC TAKE 1 TABLET(10 MG) BY MOUTH DAILY   aspirin 81 MG tablet Take 81 mg by mouth daily.   blood glucose meter kit and supplies Dispense based on patient and insurance preference. Use daily. (FOR ICD-10 E10.9, E11.9).   Blood Glucose Monitoring Suppl Devi 1 each by Does not apply route in the morning, at noon, and at bedtime. May substitute to any manufacturer covered by patient's insurance.   cyclobenzaprine 10 MG tablet Commonly known as: FLEXERIL Take 10 mg by mouth 3 (three) times daily as needed.   ezetimibe 10 MG tablet Commonly known as: ZETIA Take 1 tablet (10 mg total) by mouth daily.   FreeStyle Libre 3 Reader [place] Use to check blood sugar for type 2 diabetes on insulin   FreeStyle Libre 3 Sensor Misc Place 1 sensor on  the skin every 14 days. Use to check glucose continuously   gabapentin 600 MG tablet Commonly known as: Neurontin Take 1 tablet (600 mg total) by mouth 3 (three) times daily.   glipiZIDE 5 MG 24 hr tablet Commonly known as: GLUCOTROL XL TAKE 1 TABLET(5 MG) BY MOUTH TWICE DAILY WITH A MEAL   HYDROcodone-acetaminophen 5-325 MG tablet Commonly known as: NORCO/VICODIN Take 1-2 tablets by mouth every 4 (four) hours as needed for moderate pain.   methocarbamol 750 MG tablet Commonly known as:  ROBAXIN Take 1 tablet (750 mg total) by mouth every 8 (eight) hours as needed for muscle spasms.   metoprolol succinate 25 MG 24 hr tablet Commonly known as: TOPROL-XL Take 1 tablet (25 mg total) by mouth daily.   nitroGLYCERIN 0.4 MG SL tablet Commonly known as: NITROSTAT Place 1 tablet (0.4 mg total) under the tongue every 5 (five) minutes as needed for chest pain. Call 9-1-1 if you take 3 tablets.   OneTouch Verio test strip Generic drug: glucose blood Use as instructed   oxyCODONE 5 MG immediate release tablet Commonly known as: Oxy IR/ROXICODONE Take 1-2 tablets (5-10 mg total) by mouth every 4 (four) hours as needed for severe pain.   pantoprazole 40 MG tablet Commonly known as: PROTONIX TAKE 1 TABLET(40 MG) BY MOUTH DAILY   Pen Needles 5/16" 30G X 8 MM Misc 100 each by Does not apply route at bedtime.   QUEtiapine 50 MG tablet Commonly known as: SEROQUEL Take 1 tablet (50 mg total) by mouth at bedtime.   rosuvastatin 40 MG tablet Commonly known as: CRESTOR TAKE 1 TABLET(40 MG) BY MOUTH DAILY   sitaGLIPtin 100 MG tablet Commonly known as: Januvia Take 1 tablet (100 mg total) by mouth daily.   tamsulosin 0.4 MG Caps capsule Commonly known as: FLOMAX TAKE 2 CAPSULES(0.8 MG) BY MOUTH DAILY   traMADol 50 MG tablet Commonly known as: ULTRAM Take 50 mg by mouth 2 (two) times daily as needed.        Follow-up Information     Malva Limes, MD. Schedule an appointment as soon as possible for a visit in 1 week(s).   Specialty: Family Medicine Contact information: [address] [place] [address] [place] [address] [phone]         Sondra Come, MD Follow up in 2 week(s).   Specialty: Urology Contact information: [address] [place] [address] [phone]                Allergies  Allergen Reactions   Penicillins Hives   Trulicity [Dulaglutide] Diarrhea and Nausea And Vomiting    Consultations: General  Surgery   Procedures/Studies: NG placement    Subjective: Seen and examined.  No distress.  Wishes to go home.  Feeling well.  Discharge Exam: Vitals:   02/01/23 0539 02/01/23 0731  BP: 120/79 (!) 133/94  Pulse: 71 79  Resp: 18 18  Temp: 98.4 F (36.9 C) 98.8 F (37.1 C)  SpO2: 99% 99%   Vitals:   01/31/23 0737 01/31/23 1949 02/01/23 0539 02/01/23 0731  BP: 126/83 123/81 120/79 (!) 133/94  Pulse: 81 75 71 79  Resp:  18 18 18   Temp: 100.2 F (37.9 C) 97.9 F (36.6 C) 98.4 F (36.9 C) 98.8 F (37.1 C)  TempSrc: Oral Oral Oral Oral  SpO2: 98% 99% 99% 99%  Weight:      Height:        General: Pt is alert, awake, not in  acute distress Cardiovascular: RRR, S1/S2 +, no rubs, no gallops Respiratory: CTA bilaterally, no wheezing, no rhonchi Abdominal: Soft, NT, ND, bowel sounds + Extremities: no edema, no cyanosis    The results of significant diagnostics from this hospitalization (including imaging, microbiology, ancillary and laboratory) are listed below for reference.     Microbiology: No results found for this or any previous visit (from the past 240 hour(s)).   Labs: BNP (last 3 results) No results for input(s): "BNP" in the last 8760 hours. Basic Metabolic Panel: Recent Labs  Lab 01/28/23 1922 01/29/23 0423 01/29/23 0557  NA 137 143  --   K 4.8 4.2  --   CL 101 107  --   CO2 23 23  --   GLUCOSE 183* 121*  --   BUN 16 15  --   CREATININE 1.31* 1.13  --   CALCIUM 9.9 9.8  --   MG  --   --  1.8   Liver Function Tests: No results for input(s): "AST", "ALT", "ALKPHOS", "BILITOT", "PROT", "ALBUMIN" in the last 168 hours. Recent Labs  Lab 01/28/23 1922  LIPASE 44   No results for input(s): "AMMONIA" in the last 168 hours. CBC: Recent Labs  Lab 01/28/23 1922 01/29/23 0423  WBC 8.1 10.1  NEUTROABS 7.4  --   HGB 13.8 12.3*  HCT 41.5 35.5*  MCV 90.0 87.0  PLT 169 213   Cardiac Enzymes: No results for input(s): "CKTOTAL", "CKMB",  "CKMBINDEX", "TROPONINI" in the last 168 hours. BNP: Invalid input(s): "POCBNP" CBG: Recent Labs  Lab 01/31/23 0416 01/31/23 1228 01/31/23 1726 01/31/23 2308 02/01/23 0538  GLUCAP 82 129* 119* 119* 93   D-Dimer No results for input(s): "DDIMER" in the last 72 hours. Hgb A1c No results for input(s): "HGBA1C" in the last 72 hours. Lipid Profile No results for input(s): "CHOL", "HDL", "LDLCALC", "TRIG", "CHOLHDL", "LDLDIRECT" in the last 72 hours. Thyroid function studies No results for input(s): "TSH", "T4TOTAL", "T3FREE", "THYROIDAB" in the last 72 hours.  Invalid input(s): "FREET3" Anemia work up No results for input(s): "VITAMINB12", "FOLATE", "FERRITIN", "TIBC", "IRON", "RETICCTPCT" in the last 72 hours. Urinalysis    Component Value Date/Time   COLORURINE STRAW (A) 01/28/2023 1927   APPEARANCEUR CLEAR (A) 01/28/2023 1927   APPEARANCEUR Clear 09/24/2022 1349   LABSPEC 1.017 01/28/2023 1927   PHURINE 5.0 01/28/2023 1927   GLUCOSEU NEGATIVE 01/28/2023 1927   HGBUR NEGATIVE 01/28/2023 1927   BILIRUBINUR NEGATIVE 01/28/2023 1927   BILIRUBINUR Negative 09/24/2022 1349   KETONESUR 5 (A) 01/28/2023 1927   PROTEINUR NEGATIVE 01/28/2023 1927   UROBILINOGEN 1.0 10/18/2010 0150   NITRITE NEGATIVE 01/28/2023 1927   LEUKOCYTESUR NEGATIVE 01/28/2023 1927   Sepsis Labs Recent Labs  Lab 01/28/23 1922 01/29/23 0423  WBC 8.1 10.1   Microbiology No results found for this or any previous visit (from the past 240 hour(s)).   Time coordinating discharge: Over 30 minutes  SIGNED:   Tresa Moore, MD  [facility] 02/01/2023, 1:59 PM Pager   If 7PM-7AM, please contact night-coverage

## 2023-02-01 NOTE — Progress Notes (Signed)
Srikrishna Kuperman to be D/C'd Home per MD order.  Discussed prescriptions and follow up appointments with the patient. Prescriptions given to patient, medication list explained in detail. Pt verbalized understanding.  Allergies as of 02/01/2023       Reactions   Penicillins Hives   Trulicity [dulaglutide] Diarrhea, Nausea And Vomiting        Medication List     STOP taking these medications    Lantus SoloStar 100 UNIT/ML Solostar Pen Generic drug: insulin glargine   losartan 25 MG tablet Commonly known as: COZAAR       TAKE these medications    Accu-Chek Softclix Lancets lancets Use to check blood sugar daily for type 2 diabetes. E11.9   amLODipine 10 MG tablet Commonly known as: NORVASC TAKE 1 TABLET(10 MG) BY MOUTH DAILY   aspirin 81 MG tablet Take 81 mg by mouth daily.   blood glucose meter kit and supplies Dispense based on patient and insurance preference. Use daily. (FOR ICD-10 E10.9, E11.9).   Blood Glucose Monitoring Suppl Devi 1 each by Does not apply route in the morning, at noon, and at bedtime. May substitute to any manufacturer covered by patient's insurance.   cyclobenzaprine 10 MG tablet Commonly known as: FLEXERIL Take 10 mg by mouth 3 (three) times daily as needed.   ezetimibe 10 MG tablet Commonly known as: ZETIA Take 1 tablet (10 mg total) by mouth daily.   FreeStyle Libre 3 Reader [place] Use to check blood sugar for type 2 diabetes on insulin   FreeStyle Libre 3 Sensor Misc Place 1 sensor on the skin every 14 days. Use to check glucose continuously   gabapentin 600 MG tablet Commonly known as: Neurontin Take 1 tablet (600 mg total) by mouth 3 (three) times daily.   glipiZIDE 5 MG 24 hr tablet Commonly known as: GLUCOTROL XL TAKE 1 TABLET(5 MG) BY MOUTH TWICE DAILY WITH A MEAL   HYDROcodone-acetaminophen 5-325 MG tablet Commonly known as: NORCO/VICODIN Take 1-2 tablets by mouth every 4 (four) hours as needed for moderate pain.    methocarbamol 750 MG tablet Commonly known as: ROBAXIN Take 1 tablet (750 mg total) by mouth every 8 (eight) hours as needed for muscle spasms.   metoprolol succinate 25 MG 24 hr tablet Commonly known as: TOPROL-XL Take 1 tablet (25 mg total) by mouth daily.   nitroGLYCERIN 0.4 MG SL tablet Commonly known as: NITROSTAT Place 1 tablet (0.4 mg total) under the tongue every 5 (five) minutes as needed for chest pain. Call 9-1-1 if you take 3 tablets.   OneTouch Verio test strip Generic drug: glucose blood Use as instructed   oxyCODONE 5 MG immediate release tablet Commonly known as: Oxy IR/ROXICODONE Take 1-2 tablets (5-10 mg total) by mouth every 4 (four) hours as needed for severe pain.   pantoprazole 40 MG tablet Commonly known as: PROTONIX TAKE 1 TABLET(40 MG) BY MOUTH DAILY   Pen Needles 5/16" 30G X 8 MM Misc 100 each by Does not apply route at bedtime.   QUEtiapine 50 MG tablet Commonly known as: SEROQUEL Take 1 tablet (50 mg total) by mouth at bedtime.   rosuvastatin 40 MG tablet Commonly known as: CRESTOR TAKE 1 TABLET(40 MG) BY MOUTH DAILY   sitaGLIPtin 100 MG tablet Commonly known as: Januvia Take 1 tablet (100 mg total) by mouth daily.   tamsulosin 0.4 MG Caps capsule Commonly known as: FLOMAX TAKE 2 CAPSULES(0.8 MG) BY MOUTH DAILY   traMADol 50 MG tablet Commonly known as:  ULTRAM Take 50 mg by mouth 2 (two) times daily as needed.        Vitals:   02/01/23 0539 02/01/23 0731  BP: 120/79 (!) 133/94  Pulse: 71 79  Resp: 18 18  Temp: 98.4 F (36.9 C) 98.8 F (37.1 C)  SpO2: 99% 99%    Skin clean, dry and intact without evidence of skin break down, no evidence of skin tears noted. IV catheter discontinued intact. Site without signs and symptoms of complications. Dressing and pressure applied. Pt denies pain at this time. No complaints noted.  An After Visit Summary was printed and given to the patient. Patient escorted via WC, and D/C home via  private auto.  Willadean Guyton C. Jilda Roche

## 2023-02-02 ENCOUNTER — Telehealth: Payer: Self-pay

## 2023-02-02 NOTE — Transitions of Care (Post Inpatient/ED Visit) (Signed)
02/02/2023  Name: [name] [name] MRN: [id number] DOB: [birth year]  Today's TOC FU Call Status: Today's TOC FU Call Status:: Successful TOC FU Call Completed TOC FU Call Complete Date: 02/02/23 Patient's Name and Date of Birth confirmed.  Transition Care Management Follow-up Telephone Call Date of Discharge: 02/03/23 Discharge Facility: [facility]) Type of Discharge: Inpatient Admission Primary Inpatient Discharge Diagnosis:: "Small Bowel Obstruction" How have you been since you were released from the hospital?: Better Any questions or concerns?: No  Items Reviewed: Did you receive and understand the discharge instructions provided?: Yes (Discharge instructions reviewed during TOC visit) Any new allergies since your discharge?: No Dietary orders reviewed?: Yes Type of Diet Ordered:: Low NA, Heart Healthy Diet.  Reports his appetite is good, eats 3 meals daily, denies N/V, passing gas, +BM Do you have support at home?: Yes People in Home: spouse Name of Support/Comfort Primary Source: Wife is available to assist if needed  Medications Reviewed Today: Medications Reviewed Today     Reviewed by Amada Kingfisher, RN (Registered Nurse) on 02/02/23 at 1348  Med List Status: <None>   Medication Order Taking? Sig Documenting Provider Last Dose Status Informant  Accu-Chek Softclix Lancets lancets [id number] Yes Use to check blood sugar daily for type 2 diabetes. E11.9 Jacky Kindle, FNP Taking Active Multiple Informants  amLODipine (NORVASC) 10 MG tablet [id number] Yes TAKE 1 TABLET(10 MG) BY MOUTH DAILY Jacky Kindle, FNP Taking Active Multiple Informants  aspirin 81 MG tablet [id number] Yes Take 81 mg by mouth daily. [provider] Taking Active Multiple Informants  blood glucose meter kit and supplies [id number] Yes Dispense based on patient and insurance preference. Use daily. (FOR ICD-10 E10.9, E11.9). Jacky Kindle, FNP Taking Active Multiple Informants   Blood Glucose Monitoring Suppl DEVI [id number] Yes 1 each by Does not apply route in the morning, at noon, and at bedtime. May substitute to any manufacturer covered by patient's insurance. Jacky Kindle, FNP Taking Active Multiple Informants  Continuous Glucose Receiver (FREESTYLE LIBRE 3 READER) DEVI [id number] Yes Use to check blood sugar for type 2 diabetes on insulin Fisher, Demetrios Isaacs, MD Taking Active Multiple Informants  Continuous Glucose Sensor (FREESTYLE LIBRE 3 SENSOR) [place] [id number] Yes Place 1 sensor on the skin every 14 days. Use to check glucose continuously Jacky Kindle, FNP Taking Active Multiple Informants  cyclobenzaprine (FLEXERIL) 10 MG tablet [id number] Yes Take 10 mg by mouth 3 (three) times daily as needed. [provider] Taking Active Multiple Informants  ezetimibe (ZETIA) 10 MG tablet [id number] Yes Take 1 tablet (10 mg total) by mouth daily. Jacky Kindle, FNP Taking Active Multiple Informants  gabapentin (NEURONTIN) 600 MG tablet [id number] Yes Take 1 tablet (600 mg total) by mouth 3 (three) times daily. Jacky Kindle, FNP Taking Active Multiple Informants  glipiZIDE (GLUCOTROL XL) 5 MG 24 hr tablet [id number] Yes TAKE 1 TABLET(5 MG) BY MOUTH TWICE DAILY WITH A MEAL Malva Limes, MD Taking Active Multiple Informants  glucose blood [facility] VERIO) test strip [id number] Yes Use as instructed Malva Limes, MD Taking Active Multiple Informants  HYDROcodone-acetaminophen (NORCO/VICODIN) 5-325 MG tablet [id number] Yes Take 1-2 tablets by mouth every 4 (four) hours as needed for moderate pain. Pabon, [place] F, MD Taking Active Multiple Informants  Insulin Pen Needle (PEN NEEDLES 5/16") 30G X 8 MM MISC [id number] Yes 100 each by Does not apply route at bedtime. Jacky Kindle, FNP Taking Active Multiple Informants  methocarbamol (ROBAXIN) 750 MG  tablet [id number] Yes Take 1 tablet (750 mg total) by mouth every 8 (eight) hours as needed for muscle spasms. Jacky Kindle,  FNP Taking Active Multiple Informants  metoprolol succinate (TOPROL-XL) 25 MG 24 hr tablet [id number] Yes Take 1 tablet (25 mg total) by mouth daily. Jacky Kindle, FNP Taking Active Multiple Informants  nitroGLYCERIN (NITROSTAT) 0.4 MG SL tablet [id number] Yes Place 1 tablet (0.4 mg total) under the tongue every 5 (five) minutes as needed for chest pain. Call 9-1-1 if you take 3 tablets. Jacky Kindle, FNP Taking Active Multiple Informants  oxyCODONE (OXY IR/ROXICODONE) 5 MG immediate release tablet [id number] Yes Take 1-2 tablets (5-10 mg total) by mouth every 4 (four) hours as needed for severe pain. Rickard Patience, MD Taking Active Multiple Informants  pantoprazole (PROTONIX) 40 MG tablet [id number] Yes TAKE 1 TABLET(40 MG) BY MOUTH DAILY Jacky Kindle, FNP Taking Active Multiple Informants  QUEtiapine (SEROQUEL) 50 MG tablet [id number] Yes Take 1 tablet (50 mg total) by mouth at bedtime. Jacky Kindle, FNP Taking Active Multiple Informants  rosuvastatin (CRESTOR) 40 MG tablet [id number] Yes TAKE 1 TABLET(40 MG) BY MOUTH DAILY Jacky Kindle, FNP Taking Active Multiple Informants  sitaGLIPtin (JANUVIA) 100 MG tablet [id number] Yes Take 1 tablet (100 mg total) by mouth daily. Jacky Kindle, FNP Taking Active Multiple Informants  tamsulosin (FLOMAX) 0.4 MG CAPS capsule [id number] Yes TAKE 2 CAPSULES(0.8 MG) BY MOUTH DAILY Jacky Kindle, FNP Taking Active Multiple Informants  traMADol (ULTRAM) 50 MG tablet [id number] Yes Take 50 mg by mouth 2 (two) times daily as needed. [provider] Taking Active Multiple Informants            Home Care and Equipment/Supplies: Were Home Health Services Ordered?: No Any new equipment or medical supplies ordered?: No  Functional Questionnaire: Do you need assistance with bathing/showering or dressing?: No Do you need assistance with meal preparation?: No Do you need assistance with eating?: No Do you have difficulty maintaining continence: No Do you need  assistance with getting out of bed/getting out of a chair/moving?: No Do you have difficulty managing or taking your medications?: No  Follow up appointments reviewed: PCP Follow-up appointment confirmed?: No (Wife calling to schedule post hospital follow-up appointment) Follow-up Provider: Dr. [name] Specialist [facility] Follow-up appointment confirmed?: Yes Date of Specialist follow-up appointment?: 02/10/23 (Surgeon - Post op appt Dr. Everlene Farrier on 9/4 weds at 4:15) Follow-Up Specialty Provider:: Dr. Everlene Farrier, surgeon 02/10/2023,  Patient to contact Urologist for 2 week follow-up appointment with Dr. Richardo Hanks Do you need transportation to your follow-up appointment?: No (Spouse can assist with transportation) Do you understand care options if your condition(s) worsen?: Yes-patient verbalized understanding  SDOH Interventions Today    Flowsheet Row Most Recent Value  SDOH Interventions   Food Insecurity Interventions Intervention Not Indicated  Housing Interventions Intervention Not Indicated  Transportation Interventions Intervention Not Indicated  Utilities Interventions Intervention Not Indicated       Goals Addressed             This Visit's Progress    Transition of Care       Current Barriers:  Knowledge deficit related to small bowel obstruction, pain management and goals of care   RNCM Clinical Goal(s):  Patient will take all medications exactly as prescribed and will call provider for medication related questions as evidenced by patient teach back of taking medications as prescribed.  attend all scheduled follow-up appointments and establish self management goals  through  collaboration with Medical illustrator, provider, and care team.   Interventions: Evaluation of current treatment plan related to  self management and patient's adherence to plan as established by provider.  Pain:  (Status: New goal.) Short Term Goal  Pain assessment performed - patient report of  acceptable pain level 3/10 Medications reviewed Take medications as prescribed  - Patient to call provider and discuss current pain management regimen.   Patient Goals/Self-Care Activities: Take all medications as prescribed Attend all scheduled provider appointments Call provider office for new concerns or questions   Follow Up Plan:  Telephone follow up appointment with care management team member scheduled for:  Tuesday, 02/09/2023 1:30 pm                Floreen Teegarden J. Cristela Felt, RN, BSN, MSN Care Management Coordinator/[place] Phone Number:  [phone]

## 2023-02-02 NOTE — Patient Instructions (Addendum)
Visit Information  Thank you for taking time to visit with me today. Please don't hesitate to contact me if I can be of assistance to you before our next scheduled telephone appointment.  Our next appointment is by telephone on Tuesday, 02/09/2023 at 1:30 pm  Following is a copy of your care plan:   Goals Addressed             This Visit's Progress    Transition of Care       Current Barriers:  Knowledge deficit related to small bowel obstruction, pain management and goals of care   RNCM Clinical Goal(s):  Patient will take all medications exactly as prescribed and will call provider for medication related questions as evidenced by patient teach back of taking medications as prescribed.  attend all scheduled follow-up appointments and establish self management goals  through collaboration with RN Care manager, provider, and care team.   Interventions: Evaluation of current treatment plan related to  self management and patient's adherence to plan as established by provider.  Pain:  (Status: New goal.) Short Term Goal  Pain assessment performed - patient report of acceptable pain level 3/10 Medications reviewed Take medications as prescribed  - Patient to call provider and discuss current pain management regimen.   Patient Goals/Self-Care Activities: Take all medications as prescribed Attend all scheduled provider appointments Call provider office for new concerns or questions   Follow Up Plan:  Telephone follow up appointment with care management team member scheduled for:  Tuesday, 02/09/2023 1:30 pm                Patient verbalizes understanding of instructions and care plan provided today and agrees to view in MyChart. Active MyChart status and patient understanding of how to access instructions and care plan via MyChart confirmed with patient.     Telephone follow up appointment with care management team member scheduled [id number], 02/09/2023 1:30 pm  Please call the  care guide team at [phone] if you need to cancel or reschedule your appointment.   Please call the Suicide and Crisis Lifeline: 988 if you are experiencing a Mental Health or Behavioral Health Crisis or need someone to talk to.  Thank you,   [place] J. Cristela Felt, RN, BSN, MSN Care Management Coordinator/[place] Phone Number:  [phone]

## 2023-02-03 LAB — SURGICAL PATHOLOGY

## 2023-02-09 ENCOUNTER — Telehealth: Payer: Self-pay

## 2023-02-09 ENCOUNTER — Other Ambulatory Visit

## 2023-02-09 NOTE — Patient Instructions (Signed)
Visit Information  Thank you for taking time to visit with me today. Please don't hesitate to contact me if I can be of assistance to you before our next scheduled telephone appointment.  Our next appointment is by telephone on Thursday, February 18, 2023 at 1pm  Following is a copy of your care plan:   Goals Addressed             This Visit's Progress    Transition of Care       Current Barriers:  Knowledge deficit related to small bowel obstruction, pain management and goals of care   RNCM Clinical Goal(s):  Patient will take all medications exactly as prescribed and will call provider for medication related questions as evidenced by patient teach back of taking medications as prescribed.  attend all scheduled follow-up appointments and establish self management goals  through collaboration with RN Care manager, provider, and care team.   Interventions: Evaluation of current treatment plan related to  self management and patient's adherence to plan as established by provider.   Diabetes Interventions:  (Status:  Goal on track:  NO.) Short Term Goal Assessed patient's understanding of A1c goal: <7% Reviewed medications with patient and discussed importance of medication adherence Reviewed monitoring blood glucose levels -  reports unable to utilize CBG monitor due to issue with cell phone, wife is working to resolve issue.  Currently not checking blood glucose levels due to affordability of the Accu-chek SoftClixTest Strips.  RNCM to collaborate with patient pharmacy/provider regarding Accu-chek SoftClix Test Strips out of pocket costs.    Lab Results  Component Value Date   HGBA1C 8.2 (A) 01/13/2023    Pain Interventions:  (Status:  Goal Met.) Short Term Goal Pain assessment performed  Medications reviewed Assessed non-pharmacological interventions     Patient Goals/Self-Care Activities: Take all medications as prescribed Attend all scheduled provider  appointments Call provider office for new concerns or questions  Monitor blood glucose levels  Follow-up with providers as directed.  Post op appt Dr. Everlene Farrier on 9/4 weds at 4:15.  Call Dr. Sherrie Mustache, PCP and Urology, Dr. Hilda Lias to schedule follow-up appointments per discharge instructions.   Follow Up Plan:  Telephone follow up appointment with care management team member scheduled for:  Thursday, 02/18/2023 1:00 pm    RNCM to follow-up regarding blood glucose supplies and contact patient regarding cost.               Patient verbalizes understanding of instructions and care plan provided today and agrees to view in MyChart. Active MyChart status and patient understanding of how to access instructions and care plan via MyChart confirmed with patient.     Telephone follow up appointment with care management team member scheduled for: Thursday, February 18, 2023 at 1 pm.   Please call the care guide team at [phone] if you need to cancel or reschedule your appointment.   Please call the [place] National Suicide Prevention Lifeline: 636-122-4653 or TTY: (626)715-0626 TTY (352) 055-5700) to talk to a trained counselor if you are experiencing a Mental Health or Behavioral Health Crisis or need someone to talk to.  Maclain Cohron J. Cristela Felt, RN, BSN, MSN Care Management Coordinator/[place] Phone Number:  [phone]

## 2023-02-09 NOTE — Patient Outreach (Signed)
  Care Management  Transitions of Care Program Transitions of Care Post-discharge week 2   02/09/2023 Name: [name] [name] MRN: [id number] DOB: 12-27-1975  Subjective: [name] is a 47 y.o. year old male who is a primary care patient of Fisher, Demetrios Isaacs, MD. The Care Management team Engaged with patient Engaged with patient by telephone to assess and address transitions of care needs.   Consent to Services:  Patient was given information about care management services, agreed to services, and gave verbal consent to participate.   Assessment:           SDOH Interventions    Flowsheet Row Telephone from 02/09/2023 in [place] POPULATION HEALTH DEPARTMENT Telephone from 02/02/2023 in  POPULATION HEALTH DEPARTMENT Office Visit from 10/29/2021 in [facility]  SDOH Interventions     Food Insecurity Interventions Patient Declined Intervention Not Indicated --  Housing Interventions Intervention Not Indicated Intervention Not Indicated --  Transportation Interventions -- Intervention Not Indicated --  Utilities Interventions Intervention Not Indicated Intervention Not Indicated --  Depression Interventions/Treatment  -- -- WNU2-7 Score <4 Follow-up Not Indicated        Goals Addressed             This Visit's Progress    Transition of Care       Current Barriers:  Knowledge deficit related to small bowel obstruction, pain management and goals of care   RNCM Clinical Goal(s):  Patient will take all medications exactly as prescribed and will call provider for medication related questions as evidenced by patient teach back of taking medications as prescribed.  attend all scheduled follow-up appointments and establish self management goals  through collaboration with RN Care manager, provider, and care team.   Interventions: Evaluation of current treatment plan related to  self management and patient's adherence to plan as established by  provider.   Diabetes Interventions:  (Status:  Goal on track:  NO.) Short Term Goal Assessed patient's understanding of A1c goal: <7% Reviewed medications with patient and discussed importance of medication adherence Reviewed monitoring blood glucose levels -  reports unable to utilize CBG monitor due to issue with cell phone, wife is working to resolve issue.  Currently not checking blood glucose levels due to affordability of the Accu-chek SoftClixTest Strips.  RNCM to collaborate with patient pharmacy/provider regarding Accu-chek SoftClix Test Strips out of pocket costs.    Lab Results  Component Value Date   HGBA1C 8.2 (A) 01/13/2023    Pain Interventions:  (Status:  Goal Met.) Short Term Goal Pain assessment performed  Medications reviewed Assessed non-pharmacological interventions     Patient Goals/Self-Care Activities: Take all medications as prescribed Attend all scheduled provider appointments Call provider office for new concerns or questions  Monitor blood glucose levels  Follow-up with providers as directed.  Post op appt Dr. Everlene Farrier on 9/4 weds at 4:15.  Call Dr. Sherrie Mustache, PCP and Urology, Dr. Hilda Lias to schedule follow-up appointments per discharge instructions.   Follow Up Plan:  Telephone follow up appointment with care management team member scheduled for:  Thursday, 02/18/2023 1:00 pm    RNCM to follow-up regarding blood glucose supplies and contact patient regarding cost.               Plan: Telephone follow up appointment with care management team member scheduled for: Thursday, February 18, 2023 at 1 PM.   Barbee Cough. Gricelda Foland, RN, BSN, MSN Care Management Coordinator/ Phone Number:  [phone]

## 2023-02-10 ENCOUNTER — Ambulatory Visit (INDEPENDENT_AMBULATORY_CARE_PROVIDER_SITE_OTHER): Admitting: Surgery

## 2023-02-10 ENCOUNTER — Telehealth: Payer: Self-pay | Admitting: *Deleted

## 2023-02-10 ENCOUNTER — Encounter: Payer: Self-pay | Admitting: Surgery

## 2023-02-10 ENCOUNTER — Other Ambulatory Visit: Payer: Self-pay | Admitting: Family Medicine

## 2023-02-10 VITALS — BP 134/88 | HR 76 | Temp 98.2°F | Ht 75.0 in | Wt 244.0 lb

## 2023-02-10 DIAGNOSIS — M545 Low back pain, unspecified: Secondary | ICD-10-CM

## 2023-02-10 DIAGNOSIS — K682 Retroperitoneal fibrosis: Secondary | ICD-10-CM

## 2023-02-10 DIAGNOSIS — R1900 Intra-abdominal and pelvic swelling, mass and lump, unspecified site: Secondary | ICD-10-CM

## 2023-02-10 DIAGNOSIS — Z09 Encounter for follow-up examination after completed treatment for conditions other than malignant neoplasm: Secondary | ICD-10-CM

## 2023-02-10 DIAGNOSIS — E1140 Type 2 diabetes mellitus with diabetic neuropathy, unspecified: Secondary | ICD-10-CM

## 2023-02-10 NOTE — Telephone Encounter (Signed)
Melissa, RN Patient has not been checking glucose levels- using Libra and  having issues. Patient  needs new test strips:  One Touch Verio flex test strips. ( Pharmacy stated Rx for generic diabetes strips should get what he needs )

## 2023-02-10 NOTE — Patient Instructions (Addendum)
Call Urology to schedule a follow up with Dr Richardo Hanks. [phone].   We will refer you to Rheumatology at [facility]. They will call you to schedule this appointment.    Follow up here in 3 months.

## 2023-02-11 MED ORDER — LANCETS MISC. MISC
1.0000 | Freq: Three times a day (TID) | 0 refills | Status: AC
Start: 2023-02-11 — End: 2023-03-13

## 2023-02-11 MED ORDER — BLOOD GLUCOSE TEST VI STRP
1.0000 | ORAL_STRIP | Freq: Three times a day (TID) | 0 refills | Status: AC
Start: 2023-02-11 — End: 2023-03-13

## 2023-02-11 MED ORDER — BLOOD GLUCOSE MONITORING SUPPL DEVI
1.0000 | Freq: Three times a day (TID) | 0 refills | Status: DC
Start: 2023-02-11 — End: 2023-07-05

## 2023-02-11 MED ORDER — LANCET DEVICE MISC
1.0000 | Freq: Three times a day (TID) | 0 refills | Status: AC
Start: 2023-02-11 — End: 2023-03-13

## 2023-02-11 NOTE — Telephone Encounter (Signed)
Send prescription to [facility]. Called patient to verify pharmacy.

## 2023-02-11 NOTE — Addendum Note (Signed)
Addended by: Marjie Skiff on: 02/11/2023 02:40 PM   Modules accepted: Orders

## 2023-02-12 NOTE — Progress Notes (Signed)
[name] [name] is 2 and half weeks from debulking of retroperitoneal mass robotically.  Developed postoperative ileus.  Now he is doing well.  Pathology consistent with fibrosis and no evidence of ulcer, sarcoma or lymphoma.  Discussed with him in detail.  Discussed with Dr. Cathie Hoops He continues to endorse right testicular pain  PE NAD Abd: soft, patient is healing well without evidence of infection.  There Some lower abdominal tenderness and right testicular tenderness without obvious lesions.  A/p retroperitoneal fibrosis.  Will send to rheumatology for potential options. Guarding right testicular pain we will make arrangements for urology to see, he sees Dr. Richardo Hanks RTC 2-3 months

## 2023-02-18 ENCOUNTER — Telehealth: Payer: Self-pay

## 2023-02-18 ENCOUNTER — Other Ambulatory Visit

## 2023-02-18 DIAGNOSIS — K56609 Unspecified intestinal obstruction, unspecified as to partial versus complete obstruction: Secondary | ICD-10-CM

## 2023-02-18 NOTE — Addendum Note (Signed)
Addended by: Alyse Low A on: 02/18/2023 10:24 PM   Modules accepted: Orders

## 2023-02-18 NOTE — Patient Instructions (Signed)
Visit Information  Thank you for taking time to visit with me today. Please don't hesitate to contact me if I can be of assistance to you before our next scheduled telephone appointment.  Our next appointment is by telephone on Sept 19 at 1pm  Following is a copy of your care plan:   Goals Addressed             This Visit's Progress    Transition of Care       Current Barriers:  Knowledge deficit related to small bowel obstruction, pain management and goals of care   RNCM Clinical Goal(s):  Patient will take all medications exactly as prescribed and will call provider for medication related questions as evidenced by patient teach back of taking medications as prescribed.  attend all scheduled follow-up appointments and establish self management goals  through collaboration with RN Care manager, provider, and care team.   Interventions: Evaluation of current treatment plan related to  self management and patient's adherence to plan as established by provider.   Diabetes Interventions:  (Status:  Goal on track:  NO.) Short Term Goal Assessed patient's understanding of A1c goal: <7%  Current A1C = 8.2 Reviewed medications with patient and discussed importance of medication adherence Reviewed monitoring blood glucose levels -  reports unable to utilize CBG monitor due to issue with cell phone to access readings,  wife is working to resolve issue.  Currently not checking blood glucose levels due to affordability of the Accu-chek SoftClixTest Strips.  RNCM to collaborate with patient pharmacy/provider regarding Accu-chek SoftClix Test Strips out of pocket costs.  -  02/09/23:  RNCM contacted [facility] pharmacy in [place], [place], patient current prescription would require prior auth.  Pharmacy suggest PCP to rewrite prescription as "generic prescription for DM supplies" to allow the pharmacy to dispense as covered by patient insurance..  02/10/23:  RNCM spoke with Erskine Squibb, RN from Dr. Theodis Aguas office  to discuss patient issue with CBG monitor and not being able to check BG levels as he is currently out of DM test strips (one touch veroflex).  Office will call in test strips to patient pharmacy.  Patient made aware to follow-up with pharmacy on status.      Lab Results  Component Value Date   HGBA1C 8.2 (A) 01/13/2023    Pain Interventions:  (Status:  Goal Met.) Short Term Goal Pain assessment performed - Reports pain as being "manageable".  Medications reviewed Assessed non-pharmacological interventions     Patient Goals/Self-Care Activities: Take all medications as prescribed Attend all scheduled provider appointments Call provider office for new concerns or questions  Monitor blood glucose levels  Follow-up with providers as directed.  Post op appt Dr. Everlene Farrier on 9/4 weds at 4:15p completed. Follow up appointment with Dr. Hilda Lias, Urology was made. Still needs to call Dr. Sherrie Mustache, PCP to schedule follow-up appointment per discharge instructions. Patient stated he will call his PCP for a follow up appointment as soon as able. Patient to contact [facility] pharmacy to follow-up and obtain needed DM glucose monitoring supplies for glucometer.   Patient to contact RNCM is futher assistance needed in obtaining supplies.   Follow Up Plan:  Telephone follow up appointment with care management team member scheduled for:  Thursday, 02/25/2023 1:00 pm                    Patient verbalizes understanding of instructions and care plan provided today and agrees to view in MyChart. Active MyChart status and  patient understanding of how to access instructions and care plan via MyChart confirmed with patient.     The patient has been provided with contact information for the care management team and has been advised to call with any health related questions or concerns.   Please call the care guide team at [phone] if you need to cancel or reschedule your appointment.   Please call  1-800-273-TALK (toll free, 24 hour hotline) if you are experiencing a Mental Health or Behavioral Health Crisis or need someone to talk to.  Alyse Low, RN, BA, [facility], CRRN [facility] : [facility] Coordinator, Transition of Care Ph # [phone]

## 2023-02-18 NOTE — Patient Outreach (Deleted)
{  VBCI TOC PROGRAM NOTES:30402}

## 2023-02-18 NOTE — Patient Outreach (Signed)
Care Management  Transitions of Care Program Managed Medicaid Transitions of Care week 3   02/18/2023 Name: [name] [name] MRN: [id number] DOB: Sep 01, 1975  Subjective: [name] [name] is a 47 y.o. year old male who is a primary care patient of Fisher, Demetrios Isaacs, MD. The Care Management team Engaged with patient Engaged with patient by telephone to assess and address transitions of care needs.   Consent to Services:  Patient was given information about Managed Medicaid Care Management services, agreed to services, and gave verbal consent to participate.   Assessment:           SDOH Interventions    Flowsheet Row Telephone from 02/18/2023 in [place] POPULATION HEALTH DEPARTMENT Telephone from 02/09/2023 in [place] POPULATION HEALTH DEPARTMENT Telephone from 02/02/2023 in [place] POPULATION HEALTH DEPARTMENT Office Visit from 10/29/2021 in [facility]  SDOH Interventions      Food Insecurity Interventions Assist with SNAP Application  [Referred to [employer] to explore financial assistance resources available to him] Patient Declined Intervention Not Indicated --  Housing Interventions Intervention Not Indicated Intervention Not Indicated Intervention Not Indicated --  Transportation Interventions Intervention Not Indicated -- Intervention Not Indicated --  Utilities Interventions -- Intervention Not Indicated Intervention Not Indicated --  Depression Interventions/Treatment  -- -- -- PHQ2-9 Score <4 Follow-up Not Indicated  Financial Strain Interventions Other (Comment)  [Agreed to be referred to our BSW, [employer] to discuss applying for food stamps and exploring other financial resources to ease current financial burden on he and his wife] -- -- --        Goals Addressed             This Visit's Progress    Transition of Care       Current Barriers:  Knowledge deficit related to small bowel obstruction, pain management and goals  of care   RNCM Clinical Goal(s):  Patient will take all medications exactly as prescribed and will call provider for medication related questions as evidenced by patient teach back of taking medications as prescribed.  attend all scheduled follow-up appointments and establish self management goals  through collaboration with RN Care manager, provider, and care team.   Interventions: Evaluation of current treatment plan related to  self management and patient's adherence to plan as established by provider.   Diabetes Interventions:  (Status:  Goal on track:  NO.) Short Term Goal Assessed patient's understanding of A1c goal: <7%  Current A1C = 8.2 Reviewed medications with patient and discussed importance of medication adherence Reviewed monitoring blood glucose levels -  reports unable to utilize CBG monitor due to issue with cell phone to access readings,  wife is working to resolve issue.  Currently not checking blood glucose levels due to affordability of the Accu-chek SoftClixTest Strips.  RNCM to collaborate with patient pharmacy/provider regarding Accu-chek SoftClix Test Strips out of pocket costs.  -  02/09/23:  RNCM contacted [facility] pharmacy in [place], [place], patient current prescription would require prior auth.  Pharmacy suggest PCP to rewrite prescription as "generic prescription for DM supplies" to allow the pharmacy to dispense as covered by patient insurance..  02/10/23:  RNCM spoke with Erskine Squibb, RN from Dr. Theodis Aguas office to discuss patient issue with CBG monitor and not being able to check BG levels as he is currently out of DM test strips (one touch veroflex).  Office will call in test strips to patient pharmacy.  Patient made aware to follow-up with pharmacy on status.  Lab Results  Component Value Date   HGBA1C 8.2 (A) 01/13/2023    Pain Interventions:  (Status:  Goal Met.) Short Term Goal Pain assessment performed - Reports pain as being "manageable".  Medications  reviewed Assessed non-pharmacological interventions     Patient Goals/Self-Care Activities: Take all medications as prescribed Attend all scheduled provider appointments Call provider office for new concerns or questions  Monitor blood glucose levels  Follow-up with providers as directed.  Post op appt Dr. Everlene Farrier on 9/4 weds at 4:15p completed. Follow up appointment with Dr. Hilda Lias, Urology was made. Still needs to call Dr. Sherrie Mustache, PCP to schedule follow-up appointment per discharge instructions. Patient stated he will call his PCP for a follow up appointment as soon as able. Patient to contact [facility] pharmacy to follow-up and obtain needed DM glucose monitoring supplies for glucometer.   Patient to contact RNCM is futher assistance needed in obtaining supplies.   Follow Up Plan:  Telephone follow up appointment with care management team member scheduled for:  Thursday, 02/25/2023 1:00 pm                    Plan: The patient has been provided with contact information for the care management team and has been advised to call with any health related questions or concerns.   Alyse Low, RN, BA, [facility], CRRN [facility] Coordinator, Transition of Care Ph # [phone]

## 2023-02-22 ENCOUNTER — Encounter: Payer: Self-pay | Admitting: Surgery

## 2023-02-25 ENCOUNTER — Other Ambulatory Visit

## 2023-02-25 ENCOUNTER — Telehealth: Payer: Self-pay

## 2023-02-25 NOTE — Patient Instructions (Signed)
Visit Information  Mr. [name] [name],  Thank you for taking time to speak with me today. Please don't hesitate to contact me if I can be of assistance to you. Gus Puma, BSW also spoke to you earlier today regarding additional financial resources that may be available to you and your family, and will be sending that information to you.  Sincerely,   Elnita Maxwell    Following is a copy of your care plan:   Goals Addressed             This Visit's Progress    COMPLETED: Transition of Care       Current Barriers:  Knowledge deficit related to small bowel obstruction, pain management and goals of care   RNCM Clinical Goal(s):  Patient will take all medications exactly as prescribed and will call provider for medication related questions as evidenced by patient teach back of taking medications as prescribed.  attend all scheduled follow-up appointments and establish self management goals  through collaboration with RN Care manager, provider, and care team.   Interventions: Evaluation of current treatment plan related to  self management and patient's adherence to plan as established by provider.   Diabetes Interventions:  (Status:  Goal on track:  NO.) Short Term Goal Assessed patient's understanding of A1c goal: <7%  Current A1C = 8.2 Reviewed medications with patient and discussed importance of medication adherence Reviewed monitoring blood glucose levels -  reports unable to utilize CBG monitor due to issue with cell phone to access readings,  wife is working to resolve issue.  Currently not checking blood glucose levels due to affordability of the Accu-chek SoftClixTest Strips.  RNCM to collaborate with patient pharmacy/provider regarding Accu-chek SoftClix Test Strips out of pocket costs.  -  02/09/23:  RNCM contacted [facility] pharmacy in [place], [place], patient current prescription would require prior auth.  Pharmacy suggest PCP to rewrite prescription as "generic prescription for  DM supplies" to allow the pharmacy to dispense as covered by patient insurance..  02/10/23:  RNCM spoke with Erskine Squibb, RN from Dr. Theodis Aguas office to discuss patient issue with CBG monitor and not being able to check BG levels as he is currently out of DM test strips (one touch veroflex).  Office will call in test strips to patient pharmacy.  Patient made aware to follow-up with pharmacy on status.      Lab Results  Component Value Date   HGBA1C 8.2 (A) 01/13/2023    Pain Interventions:  (Status:  Goal Met.) Short Term Goal Pain assessment performed - Reports pain as being "manageable".  Medications reviewed Assessed non-pharmacological interventions     Patient Goals/Self-Care Activities: Take all medications as prescribed Attend all scheduled provider appointments Call provider office for new concerns or questions  Monitor blood glucose levels  Follow-up with providers as directed.  Post op appt Dr. Everlene Farrier on 9/4 weds at 4:15p completed. Follow up appointment with Dr. Hilda Lias, Urology was made. Still needs to call Dr. Sherrie Mustache, PCP to schedule follow-up appointment per discharge instructions. Patient stated he will call his PCP for a follow up appointment as soon as able. Patient to contact [facility] pharmacy to follow-up and obtain needed DM glucose monitoring supplies for glucometer.   Patient to contact RNCM is futher assistance needed in obtaining supplies.   Follow Up Plan:  This was your final Transition of Care telephone call with RN following your recent hospitalization. A successful referral was made to [employer] who spoke to you on 02/25/23 @  10:15 am, and per documentation, is sending you information regarding additional financial resources you and your family may qualify for. Jon Gills will follow up with you again during your scheduled visit with her on 10/21.                 Patient verbalizes understanding of instructions and care plan provided today and agrees to  view in MyChart. Active MyChart status and patient understanding of how to access instructions and care plan via MyChart confirmed with patient.     The patient has been provided with contact information for the care management team and has been advised to call with any health related questions or concerns.   Please call the care guide team at [phone] if you need to cancel or reschedule your appointment.   Please call 1-800-273-TALK (toll free, 24 hour hotline) if you are experiencing a Mental Health or Behavioral Health Crisis or need someone to talk to.  Alyse Low, RN, BA, [facility], CRRN [facility] Coordinator, Transition of Care Ph # [phone]

## 2023-02-25 NOTE — Patient Instructions (Signed)
Visit Information  [name] [name] was given information about Medicaid Managed Care team care coordination services as a part of their [facility] Community Plan Medicaid benefit. [name] verbally consented to engagement with the [facility] Managed Care team.   If you are experiencing a medical emergency, please call 911 or report to your local emergency department or urgent care.   If you have a non-emergency medical problem during routine business hours, please contact your provider's office and ask to speak with a nurse.   For questions related to your [facility], please call: [phone] or visit the homepage here: [email or web address]  If you would like to schedule transportation through your [facility], please call the following number at least 2 days in advance of your appointment: [phone]   Rides for urgent appointments can also be made after hours by calling Member Services.  Call the Behavioral Health Crisis Line at [phone], at any time, 24 hours a day, 7 days a week. If you are in danger or need immediate medical attention call 911.  If you would like help to quit smoking, call 1-800-QUIT-NOW ((330)082-7627) OR Espaol: 1-855-Djelo-Ya (0-254-270-6237) o para ms informacin haga clic aqu or Text READY to 628-315 to register via text  [name] - following are the goals we discussed in your visit today:   Goals Addressed   None      Social Worker will follow up in 30 days.   Gus Puma, Kenard Gower, MHA [facility]  Managed Medicaid Social Worker [phone]   Following is a copy of your plan of care:  There are no care plans that you recently modified to display for this patient.

## 2023-02-25 NOTE — Patient Outreach (Signed)
Care Management  Transitions of Care Program Managed Medicaid Transitions of Care week 4   02/25/2023 Name: [name] [name] MRN: [id number] DOB: November 30, 1975  Subjective: [name] [name] is a 47 y.o. year old male who is a primary care patient of Fisher, Demetrios Isaacs, MD. The Care Management team Engaged with patient Engaged with patient by telephone to assess and address transitions of care needs.   Consent to Services:  Patient was given information about Managed Medicaid Care Management services, agreed to services, and gave verbal consent to participate.   Assessment:           SDOH Interventions    Flowsheet Row Telephone from 02/18/2023 in [place] POPULATION HEALTH DEPARTMENT Telephone from 02/09/2023 in [place] POPULATION HEALTH DEPARTMENT Telephone from 02/02/2023 in [place] POPULATION HEALTH DEPARTMENT Office Visit from 10/29/2021 in [facility]  SDOH Interventions      Food Insecurity Interventions Assist with SNAP Application  [Referred to [employer] to explore financial assistance resources available to him] Patient Declined Intervention Not Indicated --  Housing Interventions Intervention Not Indicated Intervention Not Indicated Intervention Not Indicated --  Transportation Interventions Intervention Not Indicated -- Intervention Not Indicated --  Utilities Interventions -- Intervention Not Indicated Intervention Not Indicated --  Depression Interventions/Treatment  -- -- -- PHQ2-9 Score <4 Follow-up Not Indicated  Financial Strain Interventions Other (Comment)  [Agreed to be referred to our BSW, [employer] to discuss applying for food stamps and exploring other financial resources to ease current financial burden on he and his wife] -- -- --        Goals Addressed             This Visit's Progress    COMPLETED: Transition of Care       Current Barriers:  Knowledge deficit related to small bowel obstruction, pain management  and goals of care   RNCM Clinical Goal(s):  Patient will take all medications exactly as prescribed and will call provider for medication related questions as evidenced by patient teach back of taking medications as prescribed.  attend all scheduled follow-up appointments and establish self management goals  through collaboration with RN Care manager, provider, and care team.   Interventions: Evaluation of current treatment plan related to  self management and patient's adherence to plan as established by provider.   Diabetes Interventions:  (Status:  Goal on track:  NO.) Short Term Goal Assessed patient's understanding of A1c goal: <7%  Current A1C = 8.2 Reviewed medications with patient and discussed importance of medication adherence Reviewed monitoring blood glucose levels -  reports unable to utilize CBG monitor due to issue with cell phone to access readings,  wife is working to resolve issue.  Currently not checking blood glucose levels due to affordability of the Accu-chek SoftClixTest Strips.  RNCM to collaborate with patient pharmacy/provider regarding Accu-chek SoftClix Test Strips out of pocket costs.  -  02/09/23:  RNCM contacted [facility] pharmacy in [place], [place], patient current prescription would require prior auth.  Pharmacy suggest PCP to rewrite prescription as "generic prescription for DM supplies" to allow the pharmacy to dispense as covered by patient insurance..  02/10/23:  RNCM spoke with Erskine Squibb, RN from Dr. Theodis Aguas office to discuss patient issue with CBG monitor and not being able to check BG levels as he is currently out of DM test strips (one touch veroflex).  Office will call in test strips to patient pharmacy.  Patient made aware to follow-up with pharmacy on  status.      Lab Results  Component Value Date   HGBA1C 8.2 (A) 01/13/2023    Pain Interventions:  (Status:  Goal Met.) Short Term Goal Pain assessment performed - Reports pain as being "manageable".   Medications reviewed Assessed non-pharmacological interventions     Patient Goals/Self-Care Activities: Take all medications as prescribed Attend all scheduled provider appointments Call provider office for new concerns or questions  Monitor blood glucose levels  Follow-up with providers as directed.  Post op appt Dr. Everlene Farrier on 9/4 weds at 4:15p completed. Follow up appointment with Dr. Hilda Lias, Urology was made. Still needs to call Dr. Sherrie Mustache, PCP to schedule follow-up appointment per discharge instructions. Patient stated he will call his PCP for a follow up appointment as soon as able. Patient to contact [facility] pharmacy to follow-up and obtain needed DM glucose monitoring supplies for glucometer.   Patient to contact RNCM is futher assistance needed in obtaining supplies.   Follow Up Plan:  This was your final Transition of Care telephone call with RN following your recent hospitalization. A successful referral was made to [employer] who spoke to you on 02/25/23 @ 10:15 am, and per documentation, is sending you information regarding additional financial resources you and your family may qualify for. Jon Gills will follow up with you again during your scheduled visit with her on 10/21.                 Plan: The patient has been provided with contact information for the care management team and has been advised to call with any health related questions or concerns.   Alyse Low, RN, BA, [facility], CRRN [facility] Coordinator, Transition of Care Ph # [phone]

## 2023-02-25 NOTE — Patient Outreach (Signed)
Medicaid Managed Care Social Work Note  02/25/2023 Name:  [name] [name] MRN:  [id number] DOB:  July 01, 1975  [name] [name] is an 47 y.o. year old male who is a primary patient of Fisher, Demetrios Isaacs, MD.  The Medicaid Managed Care Coordination team was consulted for assistance with:  Community Resources   [name] [name] was given information about Medicaid Managed Care Coordination team services today. [name] [name] Patient agreed to services and verbal consent obtained.  Engaged with patient  for by telephone forinitial visit in response to referral for case management and/or care coordination services.   Assessments/Interventions:  Review of past medical history, allergies, medications, health status, including review of consultants reports, laboratory and other test data, was performed as part of comprehensive evaluation and provision of chronic care management services.  SDOH: (Social Determinant of Health) assessments and interventions performed: SDOH Interventions    Flowsheet Row Telephone from 02/18/2023 in [place] POPULATION HEALTH DEPARTMENT Telephone from 02/09/2023 in [place] POPULATION HEALTH DEPARTMENT Telephone from 02/02/2023 in [place] POPULATION HEALTH DEPARTMENT Office Visit from 10/29/2021 in [facility]  SDOH Interventions      Food Insecurity Interventions Assist with SNAP Application  [Referred to [employer] to explore financial assistance resources available to him] Patient Declined Intervention Not Indicated --  Housing Interventions Intervention Not Indicated Intervention Not Indicated Intervention Not Indicated --  Transportation Interventions Intervention Not Indicated -- Intervention Not Indicated --  Utilities Interventions -- Intervention Not Indicated Intervention Not Indicated --  Depression Interventions/Treatment  -- -- -- PHQ2-9 Score <4 Follow-up Not Indicated  Financial Strain Interventions Other (Comment)  [Agreed  to be referred to our BSW, [employer] to discuss applying for food stamps and exploring other financial resources to ease current financial burden on he and his wife] -- -- --     BSW completed a telephone outreach with patient, he states he does not work and receives SSI of 944 monthly, once he pays bills he does not have much. He was receiving foodstamps but they cut them off due to overpayment. Patient states his 70 year old son just moved in with him and its getting hard to feed them both. Patient states his utility bill is about $400. BSW and patient agreed for resources for food, utilities and any other helpful resources would be mailed to patient.  Advanced Directives Status:  Not addressed in this encounter.  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   Hydrocele in adult 12/01/2022   DDD (degenerative disc disease), lumbosacral 10/28/2022   History of coronary angioplasty with insertion of stent 04/22/2022   Annual physical exam 02/04/2022   Hypertension associated with diabetes (HCC) 02/04/2022   Gastroesophageal reflux disease with esophagitis without hemorrhage 02/04/2022   Marijuana dependence (HCC) 02/04/2022   Substance abuse in remission (HCC) 10/29/2021   Type 2 diabetes mellitus with diabetic neuropathy, without  long-term current use of insulin (HCC) 10/29/2021   History of colonic polyps 10/15/2020   Obstructive sleep apnea  09/14/2019   Hyperlipidemia LDL goal <70 09/19/2018   Hematochezia 01/19/2018   Back pain 10/23/2011   CAD (coronary artery disease) 10/29/2010   Essential hypertension    Heartburn    Hyperlipidemia associated with type 2 diabetes mellitus (HCC)     Conditions to be addressed/monitored per PCP order:   community resources  There are no care plans that you recently modified to display for this patient.   Follow up:  Patient agrees to Care Plan and Follow-up.  Plan: The Managed Medicaid care management team will reach out to the patient again over the next 30 days.  Date/time of next scheduled Social Work care management/care coordination outreach:  03/29/23  Gus Puma, Kenard Gower, [facility]  Managed [facility] Social Worker [phone]

## 2023-03-01 ENCOUNTER — Ambulatory Visit: Admitting: Gastroenterology

## 2023-03-02 ENCOUNTER — Other Ambulatory Visit: Payer: Self-pay | Admitting: Physician Assistant

## 2023-03-02 ENCOUNTER — Other Ambulatory Visit: Payer: Self-pay | Admitting: Family Medicine

## 2023-03-02 DIAGNOSIS — E1159 Type 2 diabetes mellitus with other circulatory complications: Secondary | ICD-10-CM

## 2023-03-02 DIAGNOSIS — E1169 Type 2 diabetes mellitus with other specified complication: Secondary | ICD-10-CM

## 2023-03-03 DIAGNOSIS — K682 Retroperitoneal fibrosis: Secondary | ICD-10-CM | POA: Diagnosis not present

## 2023-03-03 LAB — HEMOGLOBIN A1C: Hemoglobin A1C: 7.2

## 2023-03-03 NOTE — Telephone Encounter (Signed)
Requested Prescriptions  Pending Prescriptions Disp Refills   ezetimibe (ZETIA) 10 MG tablet [Pharmacy Med Name: EZETIMIBE 10MG  TABLETS] 90 tablet 1    Sig: TAKE 1 TABLET(10 MG) BY MOUTH DAILY     Cardiovascular:  Antilipid - Sterol Transport Inhibitors Failed - 03/02/2023  9:42 AM      Failed - Lipid Panel in normal range within the last 12 months    Cholesterol, Total  Date Value Ref Range Status  09/24/2022 125 100 - 199 mg/dL Final   LDL Chol Calc (NIH)  Date Value Ref Range Status  09/24/2022 63 0 - 99 mg/dL Final   LDL Direct  Date Value Ref Range Status  06/24/2018 129 (H) 0 - 99 mg/dL Final   HDL  Date Value Ref Range Status  09/24/2022 36 (L) >39 mg/dL Final   Triglycerides  Date Value Ref Range Status  09/24/2022 149 0 - 149 mg/dL Final         Passed - AST in normal range and within 360 days    AST  Date Value Ref Range Status  11/25/2022 20 15 - 41 U/L Final         Passed - ALT in normal range and within 360 days    ALT  Date Value Ref Range Status  11/25/2022 20 0 - 44 U/L Final         Passed - Patient is not pregnant      Passed - Valid encounter within last 12 months    Recent Outpatient Visits           1 month ago Type 2 diabetes mellitus with diabetic neuropathy, without long-term current use of insulin (HCC)   [place] [facility] Malva Limes, MD   3 months ago Epididymitis   [facility] Malva Limes, MD   4 months ago Type 2 diabetes mellitus with diabetic neuropathy, with long-term current use of insulin [facility])   [place] [facility] Merita Norton T, FNP   4 months ago Type 2 diabetes mellitus with diabetic neuropathy, without long-term current use of insulin [facility])   [place] [facility] Malva Limes, MD   5 months ago Uncontrolled diabetes mellitus of other type with hypoglycemia, unspecified hypoglycemia coma status [facility])   [place] [facility] Jacky Kindle, FNP       Future Appointments             In 1 week [name], MD [facility] [place]   In 1 month Jacky Kindle, FNP [facility], [facility]

## 2023-03-16 ENCOUNTER — Encounter: Payer: Self-pay | Admitting: Gastroenterology

## 2023-03-16 ENCOUNTER — Ambulatory Visit (INDEPENDENT_AMBULATORY_CARE_PROVIDER_SITE_OTHER): Admitting: Gastroenterology

## 2023-03-16 VITALS — BP 121/83 | HR 66 | Temp 98.4°F | Ht 75.0 in | Wt 247.0 lb

## 2023-03-16 DIAGNOSIS — R1030 Lower abdominal pain, unspecified: Secondary | ICD-10-CM

## 2023-03-16 DIAGNOSIS — R109 Unspecified abdominal pain: Secondary | ICD-10-CM

## 2023-03-16 MED ORDER — OMEPRAZOLE 40 MG PO CPDR: 40.0000 mg | DELAYED_RELEASE_CAPSULE | Freq: Every day | ORAL | 3 refills | Status: DC

## 2023-03-16 NOTE — Progress Notes (Signed)
[name] MD, MRCP(U.K) [address]  [address]  [place], [place] [address]  Main: [phone]  Fax: [phone]   Primary Care Physician: Malva Limes, MD  Primary Gastroenterologist:  Dr. [name]   Chief Complaint  Patient presents with   Abdominal Pain    HPI: [name] [name] is a 47 y.o. male  Summary of history :  Initially referred and seen on 01/14/2023 for abdominal pain . In 2022 seen for rectal bleeding. Colonoscopy showed internal hemorroids. 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 Abdominal pain in the lower abdomen going on for a few weeks if not longer. Not related to meals. Not related to bowel movements not better after the bowel movement points to the area above his pubis. Denies any NSAID use. No other aggravating or relieving factors. 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 Commenced on methotrexate and prednisone. If fails will be started on Rituxan.    02/2024: CBC,CMP,TSH normal.   He says he has not yet started taking the methotrexate or the prednisone.  He says the overall pain is better than what it was when we first discussed about 2 months back.  Generalized of the abdomen comes episodically better when he lays flat last for about 10 minutes nonradiating.  Denies any NSAID use.  Some improvement with a bowel movement.  No change with food intake.  Current Outpatient Medications  Medication Sig Dispense Refill   Accu-Chek Softclix Lancets lancets Use to check blood sugar daily for type 2 diabetes. E11.9 100 each 4   amLODipine (NORVASC) 10 MG tablet TAKE 1 TABLET(10 MG) BY MOUTH DAILY 90 tablet 1   aspirin 81 MG tablet Take 81 mg by mouth daily.     blood glucose meter kit and supplies Dispense based on patient and insurance preference. Use  daily. (FOR ICD-10 E10.9, E11.9). 1 each 0   Blood Glucose Monitoring Suppl DEVI 1 each by Does not apply route in the morning, at noon, and at bedtime. May substitute to any manufacturer covered by patient's insurance. 1 each 0   Blood Glucose Monitoring Suppl DEVI 1 each by Does not apply route in the morning, at noon, and at bedtime. May substitute to any manufacturer covered by patient's insurance. 1 each 0   Continuous Glucose Receiver (FREESTYLE LIBRE 3 READER) DEVI Use to check blood sugar for type 2 diabetes on insulin 1 each 4   Continuous Glucose Sensor (FREESTYLE LIBRE 3 SENSOR) MISC Place 1 sensor on the skin every 14 days. Use to check glucose continuously 2 each 12   cyclobenzaprine (FLEXERIL) 10 MG tablet Take 10 mg by mouth 3 (three) times daily as needed.     ezetimibe (ZETIA) 10 MG tablet TAKE 1 TABLET(10 MG) BY MOUTH DAILY 90 tablet 1   gabapentin (NEURONTIN) 600 MG tablet Take 1 tablet (600 mg total) by mouth 3 (three) times daily. 90 tablet 0   glipiZIDE (GLUCOTROL XL) 5 MG 24 hr tablet TAKE 1 TABLET(5 MG) BY MOUTH TWICE DAILY WITH A MEAL 60 tablet 1   glucose blood (ONETOUCH VERIO) test strip Use as instructed 100 each 12   HYDROcodone-acetaminophen (NORCO/VICODIN) 5-325 MG tablet Take 1-2 tablets by mouth every 4 (four) hours as needed for moderate pain. 25 tablet 0   Insulin Pen Needle (PEN NEEDLES 5/16")  30G X 8 MM MISC 100 each by Does not apply route at bedtime. 100 each 3   methocarbamol (ROBAXIN) 750 MG tablet Take 1 tablet (750 mg total) by mouth every 8 (eight) hours as needed for muscle spasms. 90 tablet 0   metoprolol succinate (TOPROL-XL) 25 MG 24 hr tablet Take 1 tablet (25 mg total) by mouth daily. 90 tablet 1   nitroGLYCERIN (NITROSTAT) 0.4 MG SL tablet Place 1 tablet (0.4 mg total) under the tongue every 5 (five) minutes as needed for chest pain. Call 9-1-1 if you take 3 tablets. 25 tablet 4   omeprazole (PRILOSEC) 40 MG capsule Take 1 capsule (40 mg total) by  mouth daily. 90 capsule 3   oxyCODONE (OXY IR/ROXICODONE) 5 MG immediate release tablet Take 1-2 tablets (5-10 mg total) by mouth every 4 (four) hours as needed for severe pain. 60 tablet 0   QUEtiapine (SEROQUEL) 50 MG tablet TAKE 1 TABLET(50 MG) BY MOUTH AT BEDTIME 30 tablet 0   rosuvastatin (CRESTOR) 40 MG tablet TAKE 1 TABLET(40 MG) BY MOUTH DAILY 90 tablet 2   sitaGLIPtin (JANUVIA) 100 MG tablet Take 1 tablet (100 mg total) by mouth daily. 90 tablet 0   tamsulosin (FLOMAX) 0.4 MG CAPS capsule TAKE 2 CAPSULES(0.8 MG) BY MOUTH DAILY 180 capsule 3   traMADol (ULTRAM) 50 MG tablet Take 50 mg by mouth 2 (two) times daily as needed.     No current facility-administered medications for this visit.    Allergies as of 03/16/2023 - Review Complete 03/16/2023  Allergen Reaction Noted   Penicillins Hives 10/01/2010   Trulicity [dulaglutide] Diarrhea and Nausea And Vomiting 10/14/2020      ROS:  General: Negative for anorexia, weight loss, fever, chills, fatigue, weakness. ENT: Negative for hoarseness, difficulty swallowing , nasal congestion. CV: Negative for chest pain, angina, palpitations, dyspnea on exertion, peripheral edema.  Respiratory: Negative for dyspnea at rest, dyspnea on exertion, cough, sputum, wheezing.  GI: See history of present illness. GU:  Negative for dysuria, hematuria, urinary incontinence, urinary frequency, nocturnal urination.  Endo: Negative for unusual weight change.    Physical Examination:   BP 121/83   Pulse 66   Temp 98.4 F (36.9 C) (Oral)   Ht 6\' 3"  (1.905 m)   Wt 247 lb (112 kg)   BMI 30.87 kg/m   General: Well-nourished, well-developed in no acute distress.  Eyes: No icterus. Conjunctivae pink. Abdomen: Bowel sounds are normal, nontender, nondistended, no hepatosplenomegaly or masses, no abdominal bruits or hernia , no rebound or guarding.   Extremities: No lower extremity edema. No clubbing or deformities. Neuro: Alert and oriented x 3.   Grossly intact. Skin: Warm and dry, no jaundice.   Psych: Alert and cooperative, normal mood and affect.   Imaging Studies: No results found.  Assessment and Plan:   [name] [name] is a 47 y.o. y/o male with a history of a retroperitoneal mass which is being evaluated by Dr. Cathie Hoops in oncology and Dr. Everlene Farrier . Bx showed fat necrosis with lymphoid hyperplasia. 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  Initial visit with me was for abdominal pain his recent PET scan has not shown anything else abnormal except what was mentioned in the report that will be related to a GI process.  I suggested him to commence on the medication prescribed by rheumatology to see if it helps with his pain.  In addition we will check H. pylori breath test and empirically  treat with a course of PPI for 12 weeks to see if it helps we will bring him back in 3 months and if he still has pain and it has not responded we will consider EGD and colonoscopy.    Dr [name]  MD,MRCP [facility]) Follow up in 3 months with Inetta Fermo

## 2023-03-17 ENCOUNTER — Other Ambulatory Visit: Payer: Self-pay | Admitting: Family Medicine

## 2023-03-17 DIAGNOSIS — K2100 Gastro-esophageal reflux disease with esophagitis, without bleeding: Secondary | ICD-10-CM

## 2023-03-17 NOTE — Telephone Encounter (Signed)
Medication discontinued 06/26/22 Requested Prescriptions  Pending Prescriptions Disp Refills   pantoprazole (PROTONIX) 40 MG tablet [Pharmacy Med Name: PANTOPRAZOLE 40MG  TABLETS] 90 tablet 3    Sig: TAKE 1 TABLET(40 MG) BY MOUTH DAILY     Gastroenterology: Proton Pump Inhibitors Passed - 03/17/2023 10:36 AM      Passed - Valid encounter within last 12 months    Recent Outpatient Visits           2 months ago Type 2 diabetes mellitus with diabetic neuropathy, without long-term current use of insulin (HCC)   [facility] Malva Limes, MD   4 months ago Epididymitis   [facility] Malva Limes, MD   4 months ago Type 2 diabetes mellitus with diabetic neuropathy, with long-term current use of insulin [facility])   [place] [facility] Merita Norton T, FNP   5 months ago Type 2 diabetes mellitus with diabetic neuropathy, without long-term current use of insulin [facility])   [place] [facility] Malva Limes, MD   5 months ago Uncontrolled diabetes mellitus of other type with hypoglycemia, unspecified hypoglycemia coma status [facility])    [facility] Jacky Kindle, FNP       Future Appointments             In 4 weeks Jacky Kindle, FNP [facility], [facility]

## 2023-03-18 LAB — H. PYLORI BREATH TEST: H pylori Breath Test: NEGATIVE

## 2023-03-22 ENCOUNTER — Other Ambulatory Visit: Payer: Self-pay | Admitting: Family Medicine

## 2023-03-22 DIAGNOSIS — M545 Low back pain, unspecified: Secondary | ICD-10-CM

## 2023-03-23 NOTE — Telephone Encounter (Signed)
Requested medication (s) are due for refill today -yes  Requested medication (s) are on the active medication list -yes  Future visit scheduled -yes  Last refill: 02/10/23 #30  Notes to clinic: non delegated Rx  Requested Prescriptions  Pending Prescriptions Disp Refills   QUEtiapine (SEROQUEL) 50 MG tablet [Pharmacy Med Name: QUETIAPINE 50MG   TABLETS] 30 tablet 0    Sig: TAKE 1 TABLET(50 MG) BY MOUTH AT BEDTIME     Not Delegated - Psychiatry:  Antipsychotics - Second Generation (Atypical) - quetiapine Failed - 03/22/2023  3:06 PM      Failed - This refill cannot be delegated      Failed - TSH in normal range and within 360 days    TSH  Date Value Ref Range Status  02/04/2022 0.705 0.450 - 4.500 uIU/mL Final         Failed - Lipid Panel in normal range within the last 12 months    Cholesterol, Total  Date Value Ref Range Status  09/24/2022 125 100 - 199 mg/dL Final   LDL Chol Calc (NIH)  Date Value Ref Range Status  09/24/2022 63 0 - 99 mg/dL Final   LDL Direct  Date Value Ref Range Status  06/24/2018 129 (H) 0 - 99 mg/dL Final   HDL  Date Value Ref Range Status  09/24/2022 36 (L) >39 mg/dL Final   Triglycerides  Date Value Ref Range Status  09/24/2022 149 0 - 149 mg/dL Final         Passed - Last BP in normal range    BP Readings from Last 1 Encounters:  03/16/23 121/83         Passed - Last Heart Rate in normal range    Pulse Readings from Last 1 Encounters:  03/16/23 66         Passed - Valid encounter within last 6 months    Recent Outpatient Visits           2 months ago Type 2 diabetes mellitus with diabetic neuropathy, without long-term current use of insulin (HCC)   [facility] Malva Limes, MD   4 months ago Epididymitis   [facility] Mal[facility] L[facility], MD   4 months ago Type 2 diabetes mellitus with diabetic neuropathy, with long-term current use of insulin (HCC)   [place]  [facility] Merita Norton T, FNP   5 months ago Type 2 diabetes mellitus with diabetic neuropathy, without long-term current use of insulin (HCC)   [place] [facility] Malva Limes, MD   6 months ago Uncontrolled diabetes mellitus of other type with hypoglycemia, unspecified hypoglycemia coma status [facility])   [place] [facility] Merita Norton T, FNP       Future Appointments             In 3 weeks Jacky Kindle, FNP [place] [facility], [facility]            Passed - CBC within normal limits and completed in the last 12 months    WBC  Date Value Ref Range Status  01/29/2023 10.1 4.0 - 10.5 K/uL Final   RBC  Date Value Ref Range Status  01/29/2023 4.08 (L) 4.22 - 5.81 MIL/uL Final   Hemoglobin  Date Value Ref Range Status  01/29/2023 12.3 (L) 13.0 - 17.0 g/dL Final  40/98/1191 47.8 13.0 - 17.7 g/dL Final   HCT  Date Value Ref  Range Status  01/29/2023 35.5 (L) 39.0 - 52.0 % Final   Hematocrit  Date Value Ref Range Status  09/24/2022 41.5 37.5 - 51.0 % Final   MCHC  Date Value Ref Range Status  01/29/2023 34.6 30.0 - 36.0 g/dL Final   [facility]  Date Value Ref Range Status  01/29/2023 30.1 26.0 - 34.0 pg Final   MCV  Date Value Ref Range Status  01/29/2023 87.0 80.0 - 100.0 fL Final  09/24/2022 91 79 - 97 fL Final   No results found for: "PLTCOUNTKUC", "LABPLAT", "POCPLA" RDW  Date Value Ref Range Status  01/29/2023 13.6 11.5 - 15.5 % Final  09/24/2022 13.1 11.6 - 15.4 % Final         Passed - CMP within normal limits and completed in the last 12 months    Albumin  Date Value Ref Range Status  11/25/2022 4.9 3.5 - 5.0 g/dL Final  95/62/1308 4.5 4.1 - 5.1 g/dL Final   Albumin ELP  Date Value Ref Range Status  12/01/2022 3.9 2.9 - 4.4 g/dL Final   Alkaline Phosphatase  Date Value Ref Range Status  11/25/2022 52 38 - 126 U/L Final   ALT  Date Value Ref Range Status  11/25/2022 20 0 - 44  U/L Final   AST  Date Value Ref Range Status  11/25/2022 20 15 - 41 U/L Final   BUN  Date Value Ref Range Status  01/29/2023 15 6 - 20 mg/dL Final  65/78/4696 8 6 - 24 mg/dL Final   Calcium  Date Value Ref Range Status  01/29/2023 9.8 8.9 - 10.3 mg/dL Final   Calcium, Ion  Date Value Ref Range Status  04/03/2009 1.23 1.12 - 1.32 mmol/L Final   CO2  Date Value Ref Range Status  01/29/2023 23 22 - 32 mmol/L Final   Bicarbonate  Date Value Ref Range Status  08/01/2020 22.9 20.0 - 28.0 mmol/L Final   TCO2  Date Value Ref Range Status  04/03/2009 28 0 - 100 mmol/L Final   Creat  Date Value Ref Range Status  10/13/2010 0.78 0.40 - 1.50 mg/dL Final   Creatinine, Ser  Date Value Ref Range Status  01/29/2023 1.13 0.61 - 1.24 mg/dL Final   Glucose, Bld  Date Value Ref Range Status  01/29/2023 121 (H) 70 - 99 mg/dL Final    Comment:    Glucose reference range applies only to samples taken after fasting for at least 8 hours.   Glucose-Capillary  Date Value Ref Range Status  02/01/2023 93 70 - 99 mg/dL Final    Comment:    Glucose reference range applies only to samples taken after fasting for at least 8 hours.   Potassium  Date Value Ref Range Status  01/29/2023 4.2 3.5 - 5.1 mmol/L Final   Sodium  Date Value Ref Range Status  01/29/2023 143 135 - 145 mmol/L Final  09/24/2022 138 134 - 144 mmol/L Final   Total Bilirubin  Date Value Ref Range Status  11/25/2022 0.9 0.3 - 1.2 mg/dL Final   Bilirubin Total  Date Value Ref Range Status  09/24/2022 0.3 0.0 - 1.2 mg/dL Final   Bilirubin, Direct  Date Value Ref Range Status  11/23/2011 0.0 0.0 - 0.3 mg/dL Final   Protein, ur  Date Value Ref Range Status  01/28/2023 NEGATIVE NEGATIVE mg/dL Final   Total Protein  Date Value Ref Range Status  11/25/2022 8.7 (H) 6.5 - 8.1 g/dL Final  29/52/8413 6.9 6.0 - 8.5  g/dL Final   Total Protein ELP  Date Value Ref Range Status  12/01/2022 7.3 6.0 - 8.5 g/dL Final    GFR calc Af Amer  Date Value Ref Range Status  07/24/2020 110 >59 mL/min/1.73 Final    Comment:    **In accordance with recommendations from the NKF-ASN Task force,**   [facility] is in the process of updating its eGFR calculation to the   2021 CKD-EPI creatinine equation that estimates kidney function   without a race variable.    GFR  Date Value Ref Range Status  11/23/2011 113.69 >60.00 mL/min Final   eGFR  Date Value Ref Range Status  09/24/2022 106 >59 mL/min/1.73 Final   GFR, Estimated  Date Value Ref Range Status  01/29/2023 >60 >60 mL/min Final    Comment:    (NOTE) Calculated using the CKD-EPI Creatinine Equation (2021)             Requested Prescriptions  Pending Prescriptions Disp Refills   QUEtiapine (SEROQUEL) 50 MG tablet [Pharmacy Med Name: QUETIAPINE 50MG   TABLETS] 30 tablet 0    Sig: TAKE 1 TABLET(50 MG) BY MOUTH AT BEDTIME     Not Delegated - Psychiatry:  Antipsychotics - Second Generation (Atypical) - quetiapine Failed - 03/22/2023  3:06 PM      Failed - This refill cannot be delegated      Failed - TSH in normal range and within 360 days    TSH  Date Value Ref Range Status  02/04/2022 0.705 0.450 - 4.500 uIU/mL Final         Failed - Lipid Panel in normal range within the last 12 months    Cholesterol, Total  Date Value Ref Range Status  09/24/2022 125 100 - 199 mg/dL Final   LDL Chol Calc (NIH)  Date Value Ref Range Status  09/24/2022 63 0 - 99 mg/dL Final   LDL Direct  Date Value Ref Range Status  06/24/2018 129 (H) 0 - 99 mg/dL Final   HDL  Date Value Ref Range Status  09/24/2022 36 (L) >39 mg/dL Final   Triglycerides  Date Value Ref Range Status  09/24/2022 149 0 - 149 mg/dL Final         Passed - Last BP in normal range    BP Readings from Last 1 Encounters:  03/16/23 121/83         Passed - Last Heart Rate in normal range    Pulse Readings from Last 1 Encounters:  03/16/23 66         Passed - Valid encounter  within last 6 months    Recent Outpatient Visits           2 months ago Type 2 diabetes mellitus with diabetic neuropathy, without long-term current use of insulin (HCC)   [place] [facility] Malva Limes, MD   4 months ago Epididymitis   [facility] Malva Limes, MD   4 months ago Type 2 diabetes mellitus with diabetic neuropathy, with long-term current use of insulin [facility])   [place] [facility] Merita Norton T, FNP   5 months ago Type 2 diabetes mellitus with diabetic neuropathy, without long-term current use of insulin [facility])   [place] [facility] Malva Limes, MD   6 months ago Uncontrolled diabetes mellitus of other type with hypoglycemia, unspecified hypoglycemia coma status [facility])   [place] [facility] Jacky Kindle, FNP  Future Appointments             In 3 weeks Jacky Kindle, FNP [place] [facility], [facility]            Passed - CBC within normal limits and completed in the last 12 months    WBC  Date Value Ref Range Status  01/29/2023 10.1 4.0 - 10.5 K/uL Final   RBC  Date Value Ref Range Status  01/29/2023 4.08 (L) 4.22 - 5.81 MIL/uL Final   Hemoglobin  Date Value Ref Range Status  01/29/2023 12.3 (L) 13.0 - 17.0 g/dL Final  41/32/4401 02.7 13.0 - 17.7 g/dL Final   HCT  Date Value Ref Range Status  01/29/2023 35.5 (L) 39.0 - 52.0 % Final   Hematocrit  Date Value Ref Range Status  09/24/2022 41.5 37.5 - 51.0 % Final   MCHC  Date Value Ref Range Status  01/29/2023 34.6 30.0 - 36.0 g/dL Final   [facility]  Date Value Ref Range Status  01/29/2023 30.1 26.0 - 34.0 pg Final   MCV  Date Value Ref Range Status  01/29/2023 87.0 80.0 - 100.0 fL Final  09/24/2022 91 79 - 97 fL Final   No results found for: "PLTCOUNTKUC", "LABPLAT", "POCPLA" RDW  Date Value Ref Range Status  01/29/2023 13.6 11.5 - 15.5 % Final  09/24/2022 13.1  11.6 - 15.4 % Final         Passed - CMP within normal limits and completed in the last 12 months    Albumin  Date Value Ref Range Status  11/25/2022 4.9 3.5 - 5.0 g/dL Final  25/36/6440 4.5 4.1 - 5.1 g/dL Final   Albumin ELP  Date Value Ref Range Status  12/01/2022 3.9 2.9 - 4.4 g/dL Final   Alkaline Phosphatase  Date Value Ref Range Status  11/25/2022 52 38 - 126 U/L Final   ALT  Date Value Ref Range Status  11/25/2022 20 0 - 44 U/L Final   AST  Date Value Ref Range Status  11/25/2022 20 15 - 41 U/L Final   BUN  Date Value Ref Range Status  01/29/2023 15 6 - 20 mg/dL Final  34/74/2595 8 6 - 24 mg/dL Final   Calcium  Date Value Ref Range Status  01/29/2023 9.8 8.9 - 10.3 mg/dL Final   Calcium, Ion  Date Value Ref Range Status  04/03/2009 1.23 1.12 - 1.32 mmol/L Final   CO2  Date Value Ref Range Status  01/29/2023 23 22 - 32 mmol/L Final   Bicarbonate  Date Value Ref Range Status  08/01/2020 22.9 20.0 - 28.0 mmol/L Final   TCO2  Date Value Ref Range Status  04/03/2009 28 0 - 100 mmol/L Final   Creat  Date Value Ref Range Status  10/13/2010 0.78 0.40 - 1.50 mg/dL Final   Creatinine, Ser  Date Value Ref Range Status  01/29/2023 1.13 0.61 - 1.24 mg/dL Final   Glucose, Bld  Date Value Ref Range Status  01/29/2023 121 (H) 70 - 99 mg/dL Final    Comment:    Glucose reference range applies only to samples taken after fasting for at least 8 hours.   Glucose-Capillary  Date Value Ref Range Status  02/01/2023 93 70 - 99 mg/dL Final    Comment:    Glucose reference range applies only to samples taken after fasting for at least 8 hours.   Potassium  Date Value Ref Range Status  01/29/2023 4.2 3.5 - 5.1 mmol/L Final  Sodium  Date Value Ref Range Status  01/29/2023 143 135 - 145 mmol/L Final  09/24/2022 138 134 - 144 mmol/L Final   Total Bilirubin  Date Value Ref Range Status  11/25/2022 0.9 0.3 - 1.2 mg/dL Final   Bilirubin Total  Date Value  Ref Range Status  09/24/2022 0.3 0.0 - 1.2 mg/dL Final   Bilirubin, Direct  Date Value Ref Range Status  11/23/2011 0.0 0.0 - 0.3 mg/dL Final   Protein, ur  Date Value Ref Range Status  01/28/2023 NEGATIVE NEGATIVE mg/dL Final   Total Protein  Date Value Ref Range Status  11/25/2022 8.7 (H) 6.5 - 8.1 g/dL Final  16/03/9603 6.9 6.0 - 8.5 g/dL Final   Total Protein ELP  Date Value Ref Range Status  12/01/2022 7.3 6.0 - 8.5 g/dL Final   GFR calc Af Amer  Date Value Ref Range Status  07/24/2020 110 >59 mL/min/1.73 Final    Comment:    **In accordance with recommendations from the NKF-ASN Task force,**   [facility] is in the process of updating its eGFR calculation to the   2021 CKD-EPI creatinine equation that estimates kidney function   without a race variable.    GFR  Date Value Ref Range Status  11/23/2011 113.69 >60.00 mL/min Final   eGFR  Date Value Ref Range Status  09/24/2022 106 >59 mL/min/1.73 Final   GFR, Estimated  Date Value Ref Range Status  01/29/2023 >60 >60 mL/min Final    Comment:    (NOTE) Calculated using the CKD-EPI Creatinine Equation (2021)

## 2023-03-26 ENCOUNTER — Other Ambulatory Visit: Payer: Self-pay | Admitting: Family Medicine

## 2023-03-26 DIAGNOSIS — I152 Hypertension secondary to endocrine disorders: Secondary | ICD-10-CM

## 2023-03-26 NOTE — Telephone Encounter (Signed)
Requested Prescriptions  Pending Prescriptions Disp Refills   metoprolol succinate (TOPROL-XL) 25 MG 24 hr tablet [Pharmacy Med Name: METOPROLOL ER SUCCINATE 25MG  TABS] 90 tablet 1    Sig: TAKE 1 TABLET(25 MG) BY MOUTH DAILY     Cardiovascular:  Beta Blockers Passed - 03/26/2023 11:33 AM      Passed - Last BP in normal range    BP Readings from Last 1 Encounters:  03/16/23 121/83         Passed - Last Heart Rate in normal range    Pulse Readings from Last 1 Encounters:  03/16/23 66         Passed - Valid encounter within last 6 months    Recent Outpatient Visits           2 months ago Type 2 diabetes mellitus with diabetic neuropathy, without long-term current use of insulin (HCC)   [place] [facility] Sherrie Mustache, Demetrios Isaacs, MD   4 months ago Epididymitis   [facility] Malva Limes, MD   4 months ago Type 2 diabetes mellitus with diabetic neuropathy, with long-term current use of insulin [facility])   [place] [facility] Merita Norton T, FNP   5 months ago Type 2 diabetes mellitus with diabetic neuropathy, without long-term current use of insulin [facility])   [place] [facility] Malva Limes, MD   6 months ago Uncontrolled diabetes mellitus of other type with hypoglycemia, unspecified hypoglycemia coma status [facility] [facility])   [place] [facility] Jacky Kindle, FNP       Future Appointments             In 2 weeks Jacky Kindle, FNP [facility]

## 2023-04-14 ENCOUNTER — Encounter: Payer: Self-pay | Admitting: Family Medicine

## 2023-04-14 ENCOUNTER — Ambulatory Visit (INDEPENDENT_AMBULATORY_CARE_PROVIDER_SITE_OTHER): Admitting: Family Medicine

## 2023-04-14 ENCOUNTER — Ambulatory Visit: Admitting: Family Medicine

## 2023-04-14 VITALS — BP 147/90 | HR 61 | Ht 74.0 in | Wt 259.5 lb

## 2023-04-14 DIAGNOSIS — Z955 Presence of coronary angioplasty implant and graft: Secondary | ICD-10-CM

## 2023-04-14 DIAGNOSIS — E1140 Type 2 diabetes mellitus with diabetic neuropathy, unspecified: Secondary | ICD-10-CM

## 2023-04-14 DIAGNOSIS — R260 Ataxic gait: Secondary | ICD-10-CM

## 2023-04-14 DIAGNOSIS — I152 Hypertension secondary to endocrine disorders: Secondary | ICD-10-CM

## 2023-04-14 DIAGNOSIS — E1159 Type 2 diabetes mellitus with other circulatory complications: Secondary | ICD-10-CM

## 2023-04-14 DIAGNOSIS — M545 Low back pain, unspecified: Secondary | ICD-10-CM

## 2023-04-14 DIAGNOSIS — I2510 Atherosclerotic heart disease of native coronary artery without angina pectoris: Secondary | ICD-10-CM

## 2023-04-14 DIAGNOSIS — M25532 Pain in left wrist: Secondary | ICD-10-CM

## 2023-04-14 MED ORDER — HYDROCODONE-ACETAMINOPHEN 5-325 MG PO TABS: 1.0000 | ORAL_TABLET | Freq: Four times a day (QID) | ORAL | 0 refills | Status: DC | PRN

## 2023-04-14 MED ORDER — NITROGLYCERIN 0.4 MG SL SUBL: 0.4000 mg | SUBLINGUAL_TABLET | SUBLINGUAL | 4 refills | Status: DC | PRN

## 2023-04-14 MED ORDER — QUETIAPINE FUMARATE 50 MG PO TABS: 50.0000 mg | ORAL_TABLET | Freq: Every day | ORAL | 0 refills | Status: AC

## 2023-04-15 ENCOUNTER — Ambulatory Visit: Admitting: Family Medicine

## 2023-04-15 ENCOUNTER — Ambulatory Visit: Admitting: Nurse Practitioner

## 2023-04-15 DIAGNOSIS — R260 Ataxic gait: Secondary | ICD-10-CM | POA: Insufficient documentation

## 2023-04-15 DIAGNOSIS — M25532 Pain in left wrist: Secondary | ICD-10-CM | POA: Insufficient documentation

## 2023-04-15 DIAGNOSIS — M545 Low back pain, unspecified: Secondary | ICD-10-CM | POA: Insufficient documentation

## 2023-04-15 NOTE — Assessment & Plan Note (Signed)
Chronic, acutely elevated in setting of pain Goal of 119/79 Previously on Norvasc 10; Toprol 25. May benefit from introduction of ACE given known DM or increase in Toprol if BP remains elevated over goal

## 2023-04-15 NOTE — Assessment & Plan Note (Signed)
Referral back to PT to assist

## 2023-04-15 NOTE — Assessment & Plan Note (Signed)
Chronic; worsening gait changes iso pain Referral back to ortho and pain mgmt as well as PT to assist Short term use of opioids to assist chronic pain Pt declines benefit from gabapentin

## 2023-04-15 NOTE — Assessment & Plan Note (Signed)
Chronic; has not followed up with Dr End in >1 year; encouraged to get back in with cardiology  Recommend BP evaluation following resolution of acute injury and flare of chronic back pain  Remains on 81 mg ASA at this time

## 2023-04-15 NOTE — Progress Notes (Signed)
Established patient visit   Patient: [name] [name]   DOB: 1975-12-31   47 y.o. Male  MRN: [id number] Visit Date: 04/14/2023  Today's healthcare provider: Jacky Kindle, FNP  Introduced to nurse practitioner role and practice setting.  All questions answered.  Discussed provider/patient relationship and expectations.  Chief Complaint  Patient presents with   Medical Management of Chronic Issues    DM II follow-up. Patient A1c last checked 03/03/23. Patient reports avg sugar is 105 or lower and checking blood pressure on occasions. He reports taking medications as prescribed with no symptoms to report.   Subjective    HPI HPI     Medical Management of Chronic Issues    Additional comments: DM II follow-up. Patient A1c last checked 03/03/23. Patient reports avg sugar is 105 or lower and checking blood pressure on occasions. He reports taking medications as prescribed with no symptoms to report.        Comments   Patient reports he is still having pain in his back and needing something for his pain. Patient reports he think he may hurt his wrist changing daughter flat tire yesterday       Last edited by Acey Lav, CMA on 04/14/2023  8:30 AM.    The patient, with a history of robotic RP mass excision complicated by SBO, presents with acute left wrist pain following an injury sustained while helping his daughter. He describes the pain as a throbbing, aching sensation that has persisted despite applying ice. The patient reports no relief from the ice and has not taken any over-the-counter pain medications. He notes difficulty moving the wrist and a noise when attempting to flex it downward. Pain is at the ulnar aspect.  In addition to the wrist pain, the patient reports ongoing lower back pain. He describes the pain as affecting the entire lower part of his back. Despite previous surgery and treatment, the patient states the back pain has not improved. He expresses  frustration with the lack of progress and a desire for further intervention, such as a shot, to alleviate the pain. He is requesting referral back to [facility] to assist with pain control.  The patient also mentions a history of high blood pressure, which has been stable but is elevated at the current visit, likely due to pain. He has been managing his blood sugar levels well and has not experienced any falls recently, although he reports some stumbles.  Medications: Outpatient Medications Prior to Visit  Medication Sig   Accu-Chek Softclix Lancets lancets Use to check blood sugar daily for type 2 diabetes. E11.9   amLODipine (NORVASC) 10 MG tablet TAKE 1 TABLET(10 MG) BY MOUTH DAILY   aspirin 81 MG tablet Take 81 mg by mouth daily.   blood glucose meter kit and supplies Dispense based on patient and insurance preference. Use daily. (FOR ICD-10 E10.9, E11.9).   Blood Glucose Monitoring Suppl DEVI 1 each by Does not apply route in the morning, at noon, and at bedtime. May substitute to any manufacturer covered by patient's insurance.   Blood Glucose Monitoring Suppl DEVI 1 each by Does not apply route in the morning, at noon, and at bedtime. May substitute to any manufacturer covered by patient's insurance.   Continuous Glucose Receiver (FREESTYLE LIBRE 3 READER) DEVI Use to check blood sugar for type 2 diabetes on insulin   Continuous Glucose Sensor (FREESTYLE LIBRE 3 SENSOR) MISC Place 1 sensor on the skin every 14 days. Use to check  glucose continuously   cyclobenzaprine (FLEXERIL) 10 MG tablet Take 10 mg by mouth 3 (three) times daily as needed.   ezetimibe (ZETIA) 10 MG tablet TAKE 1 TABLET(10 MG) BY MOUTH DAILY   gabapentin (NEURONTIN) 600 MG tablet Take 1 tablet (600 mg total) by mouth 3 (three) times daily.   glipiZIDE (GLUCOTROL XL) 5 MG 24 hr tablet TAKE 1 TABLET(5 MG) BY MOUTH TWICE DAILY WITH A MEAL   glucose blood (ONETOUCH VERIO) test strip Use as instructed   Insulin Pen Needle (PEN  NEEDLES 5/16") 30G X 8 MM MISC 100 each by Does not apply route at bedtime.   methocarbamol (ROBAXIN) 750 MG tablet Take 1 tablet (750 mg total) by mouth every 8 (eight) hours as needed for muscle spasms.   metoprolol succinate (TOPROL-XL) 25 MG 24 hr tablet TAKE 1 TABLET(25 MG) BY MOUTH DAILY   omeprazole (PRILOSEC) 40 MG capsule Take 1 capsule (40 mg total) by mouth daily.   rosuvastatin (CRESTOR) 40 MG tablet TAKE 1 TABLET(40 MG) BY MOUTH DAILY   sitaGLIPtin (JANUVIA) 100 MG tablet Take 1 tablet (100 mg total) by mouth daily.   tamsulosin (FLOMAX) 0.4 MG CAPS capsule TAKE 2 CAPSULES(0.8 MG) BY MOUTH DAILY   [DISCONTINUED] HYDROcodone-acetaminophen (NORCO/VICODIN) 5-325 MG tablet Take 1-2 tablets by mouth every 4 (four) hours as needed for moderate pain.   [DISCONTINUED] nitroGLYCERIN (NITROSTAT) 0.4 MG SL tablet Place 1 tablet (0.4 mg total) under the tongue every 5 (five) minutes as needed for chest pain. Call 9-1-1 if you take 3 tablets.   [DISCONTINUED] oxyCODONE (OXY IR/ROXICODONE) 5 MG immediate release tablet Take 1-2 tablets (5-10 mg total) by mouth every 4 (four) hours as needed for severe pain.   [DISCONTINUED] QUEtiapine (SEROQUEL) 50 MG tablet TAKE 1 TABLET(50 MG) BY MOUTH AT BEDTIME   [DISCONTINUED] traMADol (ULTRAM) 50 MG tablet Take 50 mg by mouth 2 (two) times daily as needed.   No facility-administered medications prior to visit.     Objective    BP (!) 147/90 (BP Location: Left Arm, Patient Position: Sitting, Cuff Size: Large)   Pulse 61   Ht 6\' 2"  (1.88 m)   Wt 259 lb 8 oz (117.7 kg)   SpO2 99%   BMI 33.32 kg/m   Physical Exam Vitals and nursing note reviewed.  Constitutional:      Appearance: Normal appearance. He is obese.  HENT:     Head: Normocephalic and atraumatic.  Eyes:     Pupils: Pupils are equal, round, and reactive to light.  Cardiovascular:     Rate and Rhythm: Normal rate and regular rhythm.     Pulses: Normal pulses.     Heart sounds: Normal  heart sounds.  Pulmonary:     Effort: Pulmonary effort is normal.     Breath sounds: Normal breath sounds.  Musculoskeletal:        General: Tenderness present. Normal range of motion.       Arms:     Cervical back: Normal range of motion.  Skin:    General: Skin is warm and dry.     Capillary Refill: Capillary refill takes less than 2 seconds.  Neurological:     General: No focal deficit present.     Mental Status: He is alert and oriented to person, place, and time. Mental status is at baseline.     Results for orders placed or performed in visit on 04/14/23  Hemoglobin A1c  Result Value Ref Range   Hemoglobin A1C  7.2     Assessment & Plan     Problem List Items Addressed This Visit       Cardiovascular and Mediastinum   CAD (coronary artery disease)    Chronic; has not followed up with Dr End in >1 year; encouraged to get back in with cardiology  Recommend BP evaluation following resolution of acute injury and flare of chronic back pain  Remains on 81 mg ASA at this time      Relevant Medications   nitroGLYCERIN (NITROSTAT) 0.4 MG SL tablet   Other Relevant Orders   Ambulatory referral to Cardiology   Hypertension associated with diabetes (HCC)    Chronic, acutely elevated in setting of pain Goal of 119/79 Previously on Norvasc 10; Toprol 25. May benefit from introduction of ACE given known DM or increase in Toprol if BP remains elevated over goal      Relevant Medications   nitroGLYCERIN (NITROSTAT) 0.4 MG SL tablet     Endocrine   Type 2 diabetes mellitus with diabetic neuropathy, without long-term current use of insulin (HCC)    Chronic; worsening gait changes iso pain Referral back to ortho and pain mgmt as well as PT to assist Short term use of opioids to assist chronic pain Pt declines benefit from gabapentin      Relevant Orders   Ambulatory referral to Sports Medicine   Ambulatory referral to Orthopedic Surgery   Ambulatory referral to Physical  Therapy   Urine Microalbumin w/creat. ratio   Ambulatory referral to Cardiology     Other   Acute pain of left wrist - Primary    Referral to ortho to assist; pt with brace already in place.       Relevant Medications   HYDROcodone-acetaminophen (NORCO/VICODIN) 5-325 MG tablet   Ataxic gait    Referral back to PT to assist      Relevant Orders   Ambulatory referral to Sports Medicine   Ambulatory referral to Orthopedic Surgery   Ambulatory referral to Physical Therapy   History of coronary angioplasty with insertion of stent   Relevant Medications   nitroGLYCERIN (NITROSTAT) 0.4 MG SL tablet   Low back pain potentially associated with radiculopathy   Relevant Medications   QUEtiapine (SEROQUEL) 50 MG tablet   HYDROcodone-acetaminophen (NORCO/VICODIN) 5-325 MG tablet   Left Wrist Injury Acute pain and limited mobility following trauma. No reported swelling or redness. -Refer to [facility] for urgent evaluation and possible imaging. -Continue brace -Add ice as needed -  Chronic Lower Back Pain Persistent pain despite prior surgery and physical therapy. No recent imaging or orthopedic evaluation. -Refill Norco for pain management. -Discontinue Gabapentin due to lack of efficacy; per pt request. -Refer to Dr. Burman Freestone for further evaluation and management.  Hypertension Elevated blood pressure likely secondary to pain. -Continue current management and monitor blood pressure.  General Health Maintenance -Refill Seroquel for sleep aid. -Refill Nitroglycerin as a precautionary measure for cardiac health. -Recommend follow-up with Dr. Okey Dupre for cardiac evaluation, last seen May 2023. -Collect urine sample for routine analysis for urine microalbumin   Return if symptoms worsen or fail to improve.     Leilani Merl, FNP, have reviewed all documentation for this visit. The documentation on 04/15/23 for the exam, diagnosis, procedures, and orders are all accurate and  complete.  Jacky Kindle, FNP  [facility] [phone] (phone) [phone] (fax)  [facility]

## 2023-04-15 NOTE — Assessment & Plan Note (Signed)
Referral to ortho to assist; pt with brace already in place.

## 2023-04-20 ENCOUNTER — Encounter: Payer: Self-pay | Admitting: Medical

## 2023-04-20 ENCOUNTER — Ambulatory Visit: Attending: Medical | Admitting: Medical

## 2023-04-20 VITALS — BP 137/93 | HR 80 | Ht 75.0 in | Wt 265.0 lb

## 2023-04-20 DIAGNOSIS — I10 Essential (primary) hypertension: Secondary | ICD-10-CM

## 2023-04-20 DIAGNOSIS — G4733 Obstructive sleep apnea (adult) (pediatric): Secondary | ICD-10-CM | POA: Diagnosis not present

## 2023-04-20 DIAGNOSIS — I2510 Atherosclerotic heart disease of native coronary artery without angina pectoris: Secondary | ICD-10-CM

## 2023-04-20 DIAGNOSIS — E785 Hyperlipidemia, unspecified: Secondary | ICD-10-CM

## 2023-04-20 DIAGNOSIS — Z955 Presence of coronary angioplasty implant and graft: Secondary | ICD-10-CM

## 2023-04-20 DIAGNOSIS — E1169 Type 2 diabetes mellitus with other specified complication: Secondary | ICD-10-CM | POA: Diagnosis not present

## 2023-04-20 MED ORDER — NITROGLYCERIN 0.4 MG SL SUBL: 0.4000 mg | SUBLINGUAL_TABLET | SUBLINGUAL | 1 refills | Status: DC | PRN

## 2023-04-20 NOTE — Patient Instructions (Signed)
Medication Instructions:  Your Physician recommend you continue on your current medication as directed.    *If you need a refill on your cardiac medications before your next appointment, please call your pharmacy*   Lab Work: None ordered   Follow-Up: At [facility], you and your health needs are our priority.  As part of our continuing mission to provide you with exceptional heart care, we have created designated Provider Care Teams.  These Care Teams include your primary Cardiologist (physician) and Advanced Practice Providers (APPs -  Physician Assistants and Nurse Practitioners) who all work together to provide you with the care you need, when you need it.  We recommend signing up for the patient portal called "MyChart".  Sign up information is provided on this After Visit Summary.  MyChart is used to connect with patients for Virtual Visits (Telemedicine).  Patients are able to view lab/test results, encounter notes, upcoming appointments, etc.  Non-urgent messages can be sent to your provider as well.   To learn more about what you can do with MyChart, go to [email or web address].    Your next appointment:   12 month(s)  Provider:   You may see Yvonne Kendall, MD or one of the following Advanced Practice Providers on your designated Care Team:   Nicolasa Ducking, NP Eula Listen, PA-C Cadence Fransico Michael, PA-C Charlsie Quest, NP Carlos Levering, NP    Other Instructions Referral to pulmonary

## 2023-04-20 NOTE — Progress Notes (Signed)
Cardiology Office Note:    Date:  04/20/2023   ID:  [name] [name], DOB Jun 19, 1975, MRN [id number]  PCP:  Malva Limes, MD  [facility] Cardiologist:  Yvonne Kendall, MD  [facility] Electrophysiologist:  None   Referring MD: Jacky Kindle, FNP   Chief Complaint: 1 year follow-up  History of Present Illness:    [name] [name] is a 47 y.o. male with a hx of CAD s/p BMS to LAD in 2012, hypertension, hyperlipidemia, diabetes, obesity, OSA, ADHD, polysubstance abuse who presents for follow-up.  Patient has a history of CAD status post BMS to the LAD in 2012.  ETT in 2019 was low risk, but demonstrated hypertensive response.  Patient was last seen in May 2023 and is overall feeling fairly well.  BP was fairly well-controlled.  Today, the patient is overall doing well from a cardiac perspective. He has no chest pain. He works out and this can cause some muscle pain. He denies shortness of breath. He has dependent lower leg edema. No orthopnea or pnd. No palpitations or heart fluttering. BP at home 103/72 after he takes medicine.He is trying to eat much healthier. He has left wrist injury from weight lifting.   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   Sleep apnea     Past Surgical History:  Procedure Laterality Date   ABSCESS DRAINAGE Right 2003   right arm   COLONOSCOPY WITH PROPOFOL N/A 10/10/2020   Procedure: COLONOSCOPY  WITH PROPOFOL;  Surgeon: [name], MD;  Location: [facility] ENDOSCOPY;  Service: Gastroenterology;  Laterality: N/A;   CORONARY STENT PLACEMENT  10/2010   LAD   CYSTOSCOPY  01/28/2023   Procedure: CYSTOSCOPY;  Surgeon: Sondra Come, MD;  Location: [facility] ORS;  Service: Urology;;   XI ROBOTIC ASSISTED LOWER ANTERIOR RESECTION N/A 01/28/2023   Procedure: XI ROBOTIC ASSISTED RETROPERITONEAL MASS EXCISION;  Surgeon: Leafy Ro, MD;  Location: [facility] ORS;  Service: General;  Laterality: N/A;    Current Medications: Current Meds  Medication Sig   Accu-Chek Softclix Lancets lancets Use to check blood sugar daily for type 2 diabetes. E11.9   amLODipine (NORVASC) 10 MG tablet TAKE 1 TABLET(10 MG) BY MOUTH DAILY   aspirin 81 MG tablet Take 81 mg by mouth daily.   blood glucose meter kit and supplies Dispense based on patient and insurance preference. Use daily. (FOR ICD-10 E10.9, E11.9).   Blood Glucose Monitoring Suppl DEVI 1 each by Does not apply route in the morning, at noon, and at bedtime. May substitute to any manufacturer covered by patient's insurance.   Blood Glucose Monitoring Suppl DEVI 1 each by Does not apply route in the morning, at noon, and at bedtime. May substitute to any manufacturer covered by patient's insurance.   Continuous Glucose Receiver (FREESTYLE LIBRE 3 READER) DEVI Use to check  blood sugar for type 2 diabetes on insulin   Continuous Glucose Sensor (FREESTYLE LIBRE 3 SENSOR) MISC Place 1 sensor on the skin every 14 days. Use to check glucose continuously   cyclobenzaprine (FLEXERIL) 10 MG tablet Take 10 mg by mouth 3 (three) times daily as needed.   ezetimibe (ZETIA) 10 MG tablet TAKE 1 TABLET(10 MG) BY MOUTH DAILY   gabapentin (NEURONTIN) 600 MG tablet Take 1 tablet (600 mg total) by mouth 3 (three) times daily.   glipiZIDE (GLUCOTROL XL) 5 MG 24 hr tablet TAKE 1 TABLET(5 MG) BY MOUTH TWICE DAILY WITH A MEAL   glucose blood (ONETOUCH VERIO) test strip Use as instructed    HYDROcodone-acetaminophen (NORCO/VICODIN) 5-325 MG tablet Take 1-2 tablets by mouth every 6 (six) hours as needed for moderate pain (pain score 4-6).   metoprolol succinate (TOPROL-XL) 25 MG 24 hr tablet TAKE 1 TABLET(25 MG) BY MOUTH DAILY   omeprazole (PRILOSEC) 40 MG capsule Take 1 capsule (40 mg total) by mouth daily.   QUEtiapine (SEROQUEL) 50 MG tablet Take 1 tablet (50 mg total) by mouth at bedtime.   rosuvastatin (CRESTOR) 40 MG tablet TAKE 1 TABLET(40 MG) BY MOUTH DAILY   sitaGLIPtin (JANUVIA) 100 MG tablet Take 1 tablet (100 mg total) by mouth daily.   tamsulosin (FLOMAX) 0.4 MG CAPS capsule TAKE 2 CAPSULES(0.8 MG) BY MOUTH DAILY   [DISCONTINUED] nitroGLYCERIN (NITROSTAT) 0.4 MG SL tablet Place 1 tablet (0.4 mg total) under the tongue every 5 (five) minutes as needed for chest pain. Call 9-1-1 if you take 3 tablets.     Allergies:   Penicillins and Trulicity [dulaglutide]   Social History   Socioeconomic History   Marital status: Married    Spouse name: Radene Ou   Number of children: 5   Years of education: Not on file   Highest education level: Not on file  Occupational History   Occupation: disabled  Tobacco Use   Smoking status: Former    Current packs/day: 0.00    Average packs/day: 1 pack/day for 14.0 years (14.0 ttl pk-yrs)    Types: Cigarettes    Start date: 06/2003    Quit date: 06/2017    Years since quitting: 5.8    Passive exposure: Past   Smokeless tobacco: Never  Vaping Use   Vaping status: Never Used  Substance and Sexual Activity   Alcohol use: Not Currently    Alcohol/week: 10.0 standard drinks of alcohol    Types: 2 Cans of beer, 8 Shots of liquor per week    Comment: quit approx 09/2022   Drug use: Yes    Frequency: 20.0 times per week    Types: Marijuana   Sexual activity: Yes  Other Topics Concern   Not on file  Social History Narrative   Not on file   Social Determinants of Health   Financial Resource Strain: Medium Risk (02/18/2023)    Overall Financial Resource Strain (CARDIA)    Difficulty of Paying Living Expenses: Somewhat hard  Food Insecurity: Food Insecurity Present (02/18/2023)   Hunger Vital Sign    Worried About Running Out of Food in the Last Year: Sometimes true    Ran Out of Food in the Last Year: Sometimes true  Transportation Needs: No Transportation Needs (02/18/2023)   PRAPARE - Administrator, Civil Service (Medical): No    Lack of Transportation (Non-Medical): No  Physical Activity: Unknown (01/13/2023)   Exercise Vital Sign    Days of Exercise per Week: 0  days    Minutes of Exercise per Session: Not on file  Stress: Stress Concern Present (01/13/2023)   [employer] of Occupational Health - Occupational Stress Questionnaire    Feeling of Stress : To some extent  Social Connections: Moderately Integrated (01/13/2023)   Social Connection and Isolation Panel [NHANES]    Frequency of Communication with Friends and Family: Three times a week    Frequency of Social Gatherings with Friends and Family: Once a week    Attends Religious Services: More than 4 times per year    Active Member of [employer] or Organizations: No    Attends Engineer, structural: Not on file    Marital Status: Married     Family History: The patient's family history includes Breast cancer in his mother; Diabetes in his father and mother; Heart disease in his father, paternal grandmother, and paternal uncle; Hypertension in his father, mother, and sister; Multiple myeloma in his mother.  ROS:   Please see the history of present illness.     All other systems reviewed and are negative.  EKGs/Labs/Other Studies Reviewed:    The following studies were reviewed today:  ETT 2019  Baseline EKG demonstrates normal sinus rhythm. Patient demonstrates normal exercise capacity and heart rate response. Blood pressure response was hypertensive. No significant ST segment or T wave abnormalities are noted during stress or  recovery. No significant arrhythmia is seen during stress or recovery. Low risk exercise tolerance test (Duke Treadmill Score = 9)   Hypertensive blood pressure response.  No EKG changes at maximal workload to suggest ischemia.  EKG:  EKG is not ordered today.    Recent Labs: 11/25/2022: ALT 20 01/29/2023: BUN 15; Creatinine, Ser 1.13; Hemoglobin 12.3; Magnesium 1.8; Platelets 213; Potassium 4.2; Sodium 143  Recent Lipid Panel    Component Value Date/Time   CHOL 125 09/24/2022 1349   TRIG 149 09/24/2022 1349   HDL 36 (L) 09/24/2022 1349   CHOLHDL 3.5 09/24/2022 1349   CHOLHDL 4 11/23/2011 1026   VLDL 40.0 11/23/2011 1026   LDLCALC 63 09/24/2022 1349   LDLDIRECT 129 (H) 06/24/2018 1507     Physical Exam:    VS:  BP (!) 137/93 (BP Location: Right Arm, Patient Position: Sitting, Cuff Size: Large)   Pulse 80   Ht 6\' 3"  (1.905 m)   Wt 265 lb (120.2 kg)   SpO2 99%   BMI 33.12 kg/m     Wt Readings from Last 3 Encounters:  04/20/23 265 lb (120.2 kg)  04/14/23 259 lb 8 oz (117.7 kg)  03/16/23 247 lb (112 kg)     GEN:  Well nourished, well developed in no acute distress HEENT: Normal NECK: No JVD; No carotid bruits LYMPHATICS: No lymphadenopathy CARDIAC: RRR, no murmurs, rubs, gallops RESPIRATORY:  Clear to auscultation without rales, wheezing or rhonchi  ABDOMEN: Soft, non-tender, non-distended MUSCULOSKELETAL:  No edema; No deformity  SKIN: Warm and dry NEUROLOGIC:  Alert and oriented x 3 PSYCHIATRIC:  Normal affect   ASSESSMENT:    1. Coronary artery disease involving native coronary artery of native heart without angina pectoris   2. Essential hypertension   3. Hyperlipidemia associated with type 2 diabetes mellitus (HCC)   4. OSA (obstructive sleep apnea)   5. History of coronary angioplasty with insertion of stent    PLAN:    In order of problems listed above:  CAD Patient denies any anginal symptoms.  Patient works out a couple times a week and has no  exertional symptoms.  No further ischemic workup recommended at this time.  I will refill SL NTG.  Continue aspirin 81 mg daily, Zetia, Crestor, beta-blocker.  HTN Blood pressure mildly elevated today and on a prior visit earlier this month.  Patient reports blood pressures at home are normal.  He is currently working on lifestyle changes with diet and exercise.  Continue Toprol 25 mg daily and amlodipine 10 mg daily for now.  We will reevaluate blood pressure at follow-up.  HLD LDL 63.  Continue Zetia 10 mg daily and Crestor 40 mg daily.  DM2 A1C 7.2.  Patient is working on lifestyle changes as above.  This is followed by PCP.  OSA Patient reports he has sleep apnea and has a CPAP, but does not use it.  I we will refer to pulmonology fur further management.  Disposition: Follow up in 1 year(s) with MD/APP   Signed, Miller [place] David Stall, PA-C  04/20/2023 12:45 PM    [facility]

## 2023-04-21 ENCOUNTER — Ambulatory Visit: Admitting: Family Medicine

## 2023-05-05 ENCOUNTER — Encounter: Payer: Self-pay | Admitting: Internal Medicine

## 2023-05-12 ENCOUNTER — Ambulatory Visit: Admitting: Surgery

## 2023-05-13 ENCOUNTER — Other Ambulatory Visit: Payer: Self-pay | Admitting: Family Medicine

## 2023-05-13 DIAGNOSIS — E1140 Type 2 diabetes mellitus with diabetic neuropathy, unspecified: Secondary | ICD-10-CM

## 2023-06-10 ENCOUNTER — Telehealth: Payer: Self-pay

## 2023-06-10 DIAGNOSIS — M545 Low back pain, unspecified: Secondary | ICD-10-CM

## 2023-06-10 NOTE — Telephone Encounter (Signed)
 Copied from CRM [id number]. Topic: General - Other >> Jun 08, 2023 10:10 AM Eleonore Chiquito wrote: Reason for CRM: Pt is requesting a referral be sent to [facility] for his back pain. He states he has been seen at [facility] for this problem

## 2023-06-11 NOTE — Telephone Encounter (Signed)
 Order placed

## 2023-06-11 NOTE — Addendum Note (Signed)
 Addended by: Malva Limes on: 06/11/2023 04:56 PM   Modules accepted: Orders

## 2023-06-13 ENCOUNTER — Other Ambulatory Visit: Payer: Self-pay | Admitting: Family Medicine

## 2023-06-13 DIAGNOSIS — E1140 Type 2 diabetes mellitus with diabetic neuropathy, unspecified: Secondary | ICD-10-CM

## 2023-06-14 ENCOUNTER — Telehealth: Payer: Self-pay | Admitting: Family Medicine

## 2023-06-14 DIAGNOSIS — E1140 Type 2 diabetes mellitus with diabetic neuropathy, unspecified: Secondary | ICD-10-CM

## 2023-06-14 NOTE — Telephone Encounter (Signed)
 90 day supply sent to pharmacy on 05/14/23. Too soon to refill.

## 2023-06-14 NOTE — Telephone Encounter (Signed)
 [facility] pharmacy is requesting prescription refill glipiZIDE (GLUCOTROL XL) 5 MG 24 hr tablet   Please advise

## 2023-06-15 NOTE — Telephone Encounter (Signed)
 90 day supply sent on 05/14/23, too soon to refill, will refuse.  Requested Prescriptions  Pending Prescriptions Disp Refills   glipiZIDE  (GLUCOTROL  XL) 5 MG 24 hr tablet [Pharmacy Med Name: GLIPIZIDE  ER 5MG  TABLETS] 180 tablet 0    Sig: TAKE 1 TABLET(5 MG) BY MOUTH TWICE DAILY WITH A MEAL     Endocrinology:  Diabetes - Sulfonylureas Passed - 06/15/2023  5:54 PM      Passed - HBA1C is between 0 and 7.9 and within 180 days    Hemoglobin A1C  Date Value Ref Range Status  03/03/2023 7.2  Final         Passed - Cr in normal range and within 360 days    Creat  Date Value Ref Range Status  10/13/2010 0.78 0.40 - 1.50 mg/dL Final   Creatinine, Ser  Date Value Ref Range Status  01/29/2023 1.13 0.61 - 1.24 mg/dL Final         Passed - Valid encounter within last 6 months    Recent Outpatient Visits           2 months ago Acute pain of left wrist   [facility] Emilio Marseille T, FNP   5 months ago Type 2 diabetes mellitus with diabetic neuropathy, without long-term current use of insulin  [facility])   [place] [facility] Gasper Nancyann BRAVO, MD   7 months ago Epididymitis   [facility] Gasper Nancyann BRAVO, MD   7 months ago Type 2 diabetes mellitus with diabetic neuropathy, with long-term current use of insulin  [facility])   [place] [facility] Emilio Marseille T, FNP   8 months ago Type 2 diabetes mellitus with diabetic neuropathy, without long-term current use of insulin  [facility])   [facility] Gasper, Nancyann BRAVO, MD

## 2023-07-05 ENCOUNTER — Other Ambulatory Visit: Payer: Self-pay | Admitting: Family Medicine

## 2023-07-05 DIAGNOSIS — E1140 Type 2 diabetes mellitus with diabetic neuropathy, unspecified: Secondary | ICD-10-CM

## 2023-07-05 DIAGNOSIS — M5126 Other intervertebral disc displacement, lumbar region: Secondary | ICD-10-CM | POA: Diagnosis not present

## 2023-07-05 DIAGNOSIS — M5416 Radiculopathy, lumbar region: Secondary | ICD-10-CM | POA: Diagnosis not present

## 2023-07-05 MED ORDER — GLIPIZIDE ER 5 MG PO TB24: 5.0000 mg | ORAL_TABLET | Freq: Two times a day (BID) | ORAL | 0 refills | Status: DC

## 2023-07-05 MED ORDER — BLOOD GLUCOSE MONITORING SUPPL DEVI: 1.0000 | Freq: Three times a day (TID) | 0 refills | Status: DC

## 2023-07-05 MED ORDER — CYCLOBENZAPRINE HCL 10 MG PO TABS: 10.0000 mg | ORAL_TABLET | Freq: Three times a day (TID) | ORAL | 1 refills | Status: DC | PRN

## 2023-07-09 ENCOUNTER — Telehealth: Payer: Self-pay | Admitting: *Deleted

## 2023-07-09 NOTE — Telephone Encounter (Signed)
I spoke to the Dr. Bethanne Ginger staff as well as talking to Laurette Schimke NP about this patient.  Apparently the patient came over he needed a biopsy and then after that he went to surgery and he has never returned.  If the patient is having that issue and need an injection then the best thing is to call over to his PCP and see if they would approve for injection.  The person on the phone was not Jocelyn that she would give her the information.

## 2023-07-16 DIAGNOSIS — M5416 Radiculopathy, lumbar region: Secondary | ICD-10-CM | POA: Diagnosis not present

## 2023-07-21 ENCOUNTER — Encounter: Payer: Self-pay | Admitting: Family Medicine

## 2023-07-21 ENCOUNTER — Ambulatory Visit (INDEPENDENT_AMBULATORY_CARE_PROVIDER_SITE_OTHER): Admitting: Family Medicine

## 2023-07-21 VITALS — BP 131/90 | HR 80 | Resp 16 | Ht 75.0 in | Wt 265.8 lb

## 2023-07-21 DIAGNOSIS — N529 Male erectile dysfunction, unspecified: Secondary | ICD-10-CM

## 2023-07-21 DIAGNOSIS — I2510 Atherosclerotic heart disease of native coronary artery without angina pectoris: Secondary | ICD-10-CM

## 2023-07-21 DIAGNOSIS — M51372 Other intervertebral disc degeneration, lumbosacral region with discogenic back pain and lower extremity pain: Secondary | ICD-10-CM

## 2023-07-21 DIAGNOSIS — E1142 Type 2 diabetes mellitus with diabetic polyneuropathy: Secondary | ICD-10-CM

## 2023-07-21 DIAGNOSIS — Z7984 Long term (current) use of oral hypoglycemic drugs: Secondary | ICD-10-CM | POA: Diagnosis not present

## 2023-07-21 DIAGNOSIS — G8929 Other chronic pain: Secondary | ICD-10-CM | POA: Diagnosis not present

## 2023-07-21 DIAGNOSIS — M545 Low back pain, unspecified: Secondary | ICD-10-CM | POA: Diagnosis not present

## 2023-07-21 DIAGNOSIS — Z23 Encounter for immunization: Secondary | ICD-10-CM | POA: Diagnosis not present

## 2023-07-21 DIAGNOSIS — Z125 Encounter for screening for malignant neoplasm of prostate: Secondary | ICD-10-CM

## 2023-07-21 NOTE — Progress Notes (Signed)
Established patient visit   Patient: [name] [name]   DOB: Feb 11, 1976   48 y.o. Male  MRN: [id number] Visit Date: 07/21/2023  Today's healthcare provider: [name], MD   Chief Complaint  Patient presents with   Diabetes   Hyperlipidemia   Hypertension   Subjective    Diabetes Associated symptoms include fatigue. Pertinent negatives for diabetes include no chest pain.  Hyperlipidemia Pertinent negatives include no chest pain or shortness of breath.  Hypertension Pertinent negatives include no chest pain, palpitations or shortness of breath.    Follow up htn, dm2, lipids with history of CAD. No chest pain. He reports having much more difficulty with ED the last several months and thinks it may be due to blood pressure medications, although has not had any recent changes in BP medications. Continues routine follow up cardiology.   Continue to have chronic back pain due to DDD and had several steroid injections which don't provide much relief. He would like to see pain management specialist.   Lab Results  Component Value Date   HGBA1C 7.2 03/03/2023   HGBA1C 8.2 (A) 01/13/2023   HGBA1C 12.3 (A) 10/28/2022   Lab Results  Component Value Date   NA 143 01/29/2023   K 4.2 01/29/2023   CREATININE 1.13 01/29/2023   GFRNONAA >60 01/29/2023   GLUCOSE 121 (H) 01/29/2023   Lab Results  Component Value Date   CHOL 125 09/24/2022   HDL 36 (L) 09/24/2022   LDLCALC 63 09/24/2022   LDLDIRECT 129 (H) 06/24/2018   TRIG 149 09/24/2022   CHOLHDL 3.5 09/24/2022     Medications: Outpatient Medications Prior to Visit  Medication Sig   Accu-Chek Softclix Lancets lancets Use to check blood sugar daily for type 2 diabetes. E11.9   amLODipine (NORVASC) 10 MG tablet TAKE 1 TABLET(10 MG) BY MOUTH DAILY   aspirin 81 MG tablet Take 81 mg by mouth daily.   blood glucose meter kit and supplies Dispense based on patient and insurance preference. Use daily. (FOR ICD-10 E10.9, E11.9).    Blood Glucose Monitoring Suppl DEVI 1 each by Does not apply route in the morning, at noon, and at bedtime. May substitute to any manufacturer covered by patient's insurance.   Blood Glucose Monitoring Suppl DEVI 1 each by Does not apply route in the morning, at noon, and at bedtime. May substitute to any manufacturer covered by patient's insurance.   Continuous Glucose Receiver (FREESTYLE LIBRE 3 READER) DEVI Use to check blood sugar for type 2 diabetes on insulin   Continuous Glucose Sensor (FREESTYLE LIBRE 3 SENSOR) MISC Place 1 sensor on the skin every 14 days. Use to check glucose continuously   cyclobenzaprine (FLEXERIL) 10 MG tablet Take 1 tablet (10 mg total) by mouth 3 (three) times daily as needed.   ezetimibe (ZETIA) 10 MG tablet TAKE 1 TABLET(10 MG) BY MOUTH DAILY   gabapentin (NEURONTIN) 600 MG tablet Take 1 tablet (600 mg total) by mouth 3 (three) times daily.   glipiZIDE (GLUCOTROL XL) 5 MG 24 hr tablet Take 1 tablet (5 mg total) by mouth 2 (two) times daily.   glucose blood (ONETOUCH VERIO) test strip Use as instructed   HYDROcodone-acetaminophen (NORCO/VICODIN) 5-325 MG tablet Take 1-2 tablets by mouth every 6 (six) hours as needed for moderate pain (pain score 4-6).   Insulin Pen Needle (PEN NEEDLES 5/16") 30G X 8 MM MISC 100 each by Does not apply route at bedtime.   methocarbamol (ROBAXIN) 750 MG tablet  Take 1 tablet (750 mg total) by mouth every 8 (eight) hours as needed for muscle spasms.   metoprolol succinate (TOPROL-XL) 25 MG 24 hr tablet TAKE 1 TABLET(25 MG) BY MOUTH DAILY   nitroGLYCERIN (NITROSTAT) 0.4 MG SL tablet Place 1 tablet (0.4 mg total) under the tongue every 5 (five) minutes as needed for chest pain. Call 9-1-1 if you take 3 tablets.   omeprazole (PRILOSEC) 40 MG capsule Take 1 capsule (40 mg total) by mouth daily.   QUEtiapine (SEROQUEL) 50 MG tablet Take 1 tablet (50 mg total) by mouth at bedtime.   rosuvastatin (CRESTOR) 40 MG tablet TAKE 1 TABLET(40 MG) BY  MOUTH DAILY   sitaGLIPtin (JANUVIA) 100 MG tablet Take 1 tablet (100 mg total) by mouth daily.   tamsulosin (FLOMAX) 0.4 MG CAPS capsule TAKE 2 CAPSULES(0.8 MG) BY MOUTH DAILY   No facility-administered medications prior to visit.    Review of Systems  Constitutional:  Positive for fatigue. Negative for appetite change, chills and fever.  Respiratory:  Negative for chest tightness, shortness of breath and wheezing.   Cardiovascular:  Negative for chest pain and palpitations.  Gastrointestinal:  Negative for abdominal pain, nausea and vomiting.       Objective    BP (!) 131/90 (BP Location: Right Arm, Patient Position: Sitting, Cuff Size: Large)   Pulse 80   Resp 16   Ht 6\' 3"  (1.905 m)   Wt 265 lb 12.8 oz (120.6 kg)   SpO2 100%   BMI 33.22 kg/m    Physical Exam  General appearance: Obese male, cooperative and in no acute distress Head: Normocephalic, without obvious abnormality, atraumatic Respiratory: Respirations even and unlabored, normal respiratory rate Extremities: All extremities are intact.  Skin: Skin color, texture, turgor normal. No rashes seen  Psych: Appropriate mood and affect. Neurologic: Mental status: Alert, oriented to person, place, and time, thought content appropriate.   Assessment & Plan     1. Type 2 diabetes mellitus with peripheral neuropathy (HCC) (Primary)  - Comprehensive metabolic panel - Hemoglobin A1c - Urine Microalbumin w/creat. ratio  2. Erectile dysfunction, unspecified erectile dysfunction type  - PSA Total (Reflex To Free) - TSH - Testosterone,Free and Total  He is concerned this may be due his blood pressure medications, specifically amlodipine.  Consider change to ARB  3. Prostate cancer screening  - PSA Total (Reflex To Free)  4. Coronary artery disease involving native coronary artery of native heart without angina pectoris Asymptomatic. Compliant with medication.  Continue aggressive risk factor modification.   Continue regular follow up cardiology.   5. Chronic right-sided low back pain, unspecified whether sciatica present Diminishing improvements with steroid injections he has been getting at [facility].  - Ambulatory referral to Pain Clinic  6. Degeneration of intervertebral disc of lumbosacral region with discogenic back pain and lower extremity pain  - Ambulatory referral to Pain Clinic  7. Need for Tdap vaccination  - Tdap vaccine greater than or equal to 7yo IM          [name], MD  [facility] [phone] (phone) [phone] (fax)  [facility]

## 2023-07-25 LAB — COMPREHENSIVE METABOLIC PANEL
ALT: 18 [IU]/L (ref 0–44)
AST: 12 [IU]/L (ref 0–40)
Albumin: 4.9 g/dL (ref 4.1–5.1)
Alkaline Phosphatase: 60 [IU]/L (ref 44–121)
BUN/Creatinine Ratio: 10 (ref 9–20)
BUN: 15 mg/dL (ref 6–24)
Bilirubin Total: 0.4 mg/dL (ref 0.0–1.2)
CO2: 22 mmol/L (ref 20–29)
Calcium: 9.7 mg/dL (ref 8.7–10.2)
Chloride: 102 mmol/L (ref 96–106)
Creatinine, Ser: 1.47 mg/dL — ABNORMAL HIGH (ref 0.76–1.27)
Globulin, Total: 2.4 g/dL (ref 1.5–4.5)
Glucose: 182 mg/dL — ABNORMAL HIGH (ref 70–99)
Potassium: 4.5 mmol/L (ref 3.5–5.2)
Sodium: 140 mmol/L (ref 134–144)
Total Protein: 7.3 g/dL (ref 6.0–8.5)
eGFR: 59 mL/min/{1.73_m2} — ABNORMAL LOW (ref >59)

## 2023-07-25 LAB — MICROALBUMIN / CREATININE URINE RATIO
Creatinine, Urine: 111.7 mg/dL
Microalb/Creat Ratio: 3 mg/g{creat} (ref 0–29)
Microalbumin, Urine: 3.5 ug/mL

## 2023-07-25 LAB — HEMOGLOBIN A1C
Est. average glucose Bld gHb Est-mCnc: 157 mg/dL
Hgb A1c MFr Bld: 7.1 % — ABNORMAL HIGH (ref 4.8–5.6)

## 2023-07-25 LAB — TESTOSTERONE,FREE AND TOTAL
Testosterone, Free: 7.5 pg/mL (ref 6.8–21.5)
Testosterone: 242 ng/dL — ABNORMAL LOW (ref 264–916)

## 2023-07-25 LAB — TSH: TSH: 0.733 u[IU]/mL (ref 0.450–4.500)

## 2023-07-25 LAB — PSA TOTAL (REFLEX TO FREE): Prostate Specific Ag, Serum: 0.5 ng/mL (ref 0.0–4.0)

## 2023-07-27 ENCOUNTER — Other Ambulatory Visit: Payer: Self-pay | Admitting: Family Medicine

## 2023-07-27 ENCOUNTER — Encounter: Payer: Self-pay | Admitting: Family Medicine

## 2023-07-27 DIAGNOSIS — E291 Testicular hypofunction: Secondary | ICD-10-CM | POA: Insufficient documentation

## 2023-07-29 ENCOUNTER — Other Ambulatory Visit: Payer: Self-pay

## 2023-07-29 DIAGNOSIS — E1140 Type 2 diabetes mellitus with diabetic neuropathy, unspecified: Secondary | ICD-10-CM

## 2023-07-29 DIAGNOSIS — M25532 Pain in left wrist: Secondary | ICD-10-CM

## 2023-07-30 MED ORDER — HYDROCODONE-ACETAMINOPHEN 5-325 MG PO TABS: 1.0000 | ORAL_TABLET | Freq: Four times a day (QID) | ORAL | 0 refills | Status: DC | PRN

## 2023-07-30 MED ORDER — SITAGLIPTIN PHOSPHATE 100 MG PO TABS: 100.0000 mg | ORAL_TABLET | Freq: Every day | ORAL | 3 refills | Status: AC

## 2023-08-06 DIAGNOSIS — M25552 Pain in left hip: Secondary | ICD-10-CM | POA: Diagnosis not present

## 2023-08-06 DIAGNOSIS — M5126 Other intervertebral disc displacement, lumbar region: Secondary | ICD-10-CM | POA: Diagnosis not present

## 2023-08-06 DIAGNOSIS — M1612 Unilateral primary osteoarthritis, left hip: Secondary | ICD-10-CM | POA: Diagnosis not present

## 2023-08-06 DIAGNOSIS — M5416 Radiculopathy, lumbar region: Secondary | ICD-10-CM | POA: Diagnosis not present

## 2023-08-09 ENCOUNTER — Encounter: Payer: Self-pay | Admitting: Family Medicine

## 2023-08-10 ENCOUNTER — Other Ambulatory Visit: Payer: Self-pay | Admitting: Family Medicine

## 2023-08-10 DIAGNOSIS — M25552 Pain in left hip: Secondary | ICD-10-CM

## 2023-08-11 DIAGNOSIS — M5126 Other intervertebral disc displacement, lumbar region: Secondary | ICD-10-CM | POA: Diagnosis not present

## 2023-08-11 DIAGNOSIS — M5416 Radiculopathy, lumbar region: Secondary | ICD-10-CM | POA: Diagnosis not present

## 2023-08-12 ENCOUNTER — Telehealth: Payer: Self-pay | Admitting: Family Medicine

## 2023-08-12 NOTE — Telephone Encounter (Signed)
 Copied from CRM [id number]. Topic: Clinical - Medical Advice >> Aug 12, 2023  2:01 PM Yolanda T wrote: Reason for CRM: patient is scheduled for MRI and X-Ray on Sunday March 9th and need a valium for the test

## 2023-08-13 MED ORDER — DIAZEPAM 5 MG PO TABS: 5.0000 mg | ORAL_TABLET | Freq: Once | ORAL | 0 refills | Status: DC | PRN

## 2023-08-13 NOTE — Addendum Note (Signed)
 Addended by: Jacquenette Shone on: 08/13/2023 03:17 PM   Modules accepted: Orders

## 2023-08-15 ENCOUNTER — Ambulatory Visit
Admission: RE | Admit: 2023-08-15 | Discharge: 2023-08-15 | Disposition: A | Discharge status: Home / Self Care | Source: Ambulatory Visit | Attending: Family Medicine | Admitting: Family Medicine

## 2023-08-15 DIAGNOSIS — M25552 Pain in left hip: Secondary | ICD-10-CM

## 2023-09-01 DIAGNOSIS — M5416 Radiculopathy, lumbar region: Secondary | ICD-10-CM | POA: Diagnosis not present

## 2023-09-01 DIAGNOSIS — M5126 Other intervertebral disc displacement, lumbar region: Secondary | ICD-10-CM | POA: Diagnosis not present

## 2023-09-01 DIAGNOSIS — M25552 Pain in left hip: Secondary | ICD-10-CM | POA: Diagnosis not present

## 2023-09-01 DIAGNOSIS — M1612 Unilateral primary osteoarthritis, left hip: Secondary | ICD-10-CM | POA: Diagnosis not present

## 2023-09-03 ENCOUNTER — Other Ambulatory Visit: Payer: Self-pay | Admitting: Family Medicine

## 2023-09-03 DIAGNOSIS — M5416 Radiculopathy, lumbar region: Secondary | ICD-10-CM

## 2023-09-12 ENCOUNTER — Ambulatory Visit
Admission: RE | Admit: 2023-09-12 | Discharge: 2023-09-12 | Disposition: A | Discharge status: Home / Self Care | Source: Ambulatory Visit | Attending: Family Medicine | Admitting: Family Medicine

## 2023-09-12 DIAGNOSIS — M5416 Radiculopathy, lumbar region: Secondary | ICD-10-CM

## 2023-09-14 DIAGNOSIS — M5442 Lumbago with sciatica, left side: Secondary | ICD-10-CM | POA: Diagnosis not present

## 2023-09-14 DIAGNOSIS — M5441 Lumbago with sciatica, right side: Secondary | ICD-10-CM | POA: Diagnosis not present

## 2023-09-14 DIAGNOSIS — M4727 Other spondylosis with radiculopathy, lumbosacral region: Secondary | ICD-10-CM | POA: Diagnosis not present

## 2023-09-14 DIAGNOSIS — G4733 Obstructive sleep apnea (adult) (pediatric): Secondary | ICD-10-CM | POA: Diagnosis not present

## 2023-09-14 DIAGNOSIS — M4316 Spondylolisthesis, lumbar region: Secondary | ICD-10-CM | POA: Diagnosis not present

## 2023-09-14 DIAGNOSIS — G8929 Other chronic pain: Secondary | ICD-10-CM | POA: Diagnosis not present

## 2023-09-14 DIAGNOSIS — Z6837 Body mass index (BMI) 37.0-37.9, adult: Secondary | ICD-10-CM | POA: Diagnosis not present

## 2023-09-14 DIAGNOSIS — E66812 Obesity, class 2: Secondary | ICD-10-CM | POA: Diagnosis not present

## 2023-10-10 ENCOUNTER — Other Ambulatory Visit: Payer: Self-pay | Admitting: Family Medicine

## 2023-10-10 DIAGNOSIS — E1140 Type 2 diabetes mellitus with diabetic neuropathy, unspecified: Secondary | ICD-10-CM

## 2023-10-19 ENCOUNTER — Other Ambulatory Visit: Payer: Self-pay | Admitting: Family Medicine

## 2023-10-19 DIAGNOSIS — E1159 Type 2 diabetes mellitus with other circulatory complications: Secondary | ICD-10-CM

## 2023-10-26 DIAGNOSIS — S73192A Other sprain of left hip, initial encounter: Secondary | ICD-10-CM | POA: Diagnosis not present

## 2023-10-26 DIAGNOSIS — M1612 Unilateral primary osteoarthritis, left hip: Secondary | ICD-10-CM | POA: Diagnosis not present

## 2023-10-26 DIAGNOSIS — M25852 Other specified joint disorders, left hip: Secondary | ICD-10-CM | POA: Diagnosis not present

## 2023-10-28 ENCOUNTER — Other Ambulatory Visit: Payer: Self-pay | Admitting: Family Medicine

## 2023-10-28 DIAGNOSIS — E1159 Type 2 diabetes mellitus with other circulatory complications: Secondary | ICD-10-CM

## 2023-11-03 ENCOUNTER — Emergency Department

## 2023-11-03 ENCOUNTER — Other Ambulatory Visit: Payer: Self-pay

## 2023-11-03 ENCOUNTER — Emergency Department: Admission: EM | Admit: 2023-11-03 | Discharge: 2023-11-03 | Disposition: A | Discharge status: Home / Self Care

## 2023-11-03 DIAGNOSIS — R197 Diarrhea, unspecified: Secondary | ICD-10-CM | POA: Diagnosis not present

## 2023-11-03 DIAGNOSIS — I2510 Atherosclerotic heart disease of native coronary artery without angina pectoris: Secondary | ICD-10-CM | POA: Insufficient documentation

## 2023-11-03 DIAGNOSIS — R1030 Lower abdominal pain, unspecified: Secondary | ICD-10-CM | POA: Diagnosis not present

## 2023-11-03 DIAGNOSIS — R1031 Right lower quadrant pain: Secondary | ICD-10-CM | POA: Diagnosis not present

## 2023-11-03 DIAGNOSIS — R110 Nausea: Secondary | ICD-10-CM

## 2023-11-03 DIAGNOSIS — E119 Type 2 diabetes mellitus without complications: Secondary | ICD-10-CM | POA: Diagnosis not present

## 2023-11-03 DIAGNOSIS — R1032 Left lower quadrant pain: Secondary | ICD-10-CM | POA: Diagnosis not present

## 2023-11-03 LAB — COMPREHENSIVE METABOLIC PANEL WITH GFR
ALT: 24 U/L (ref 0–44)
AST: 18 U/L (ref 15–41)
Albumin: 4.4 g/dL (ref 3.5–5.0)
Alkaline Phosphatase: 49 U/L (ref 38–126)
Anion gap: 9 (ref 5–15)
BUN: 14 mg/dL (ref 6–20)
CO2: 20 mmol/L — ABNORMAL LOW (ref 22–32)
Calcium: 9 mg/dL (ref 8.9–10.3)
Chloride: 108 mmol/L (ref 98–111)
Creatinine, Ser: 1.2 mg/dL (ref 0.61–1.24)
GFR, Estimated: >60 mL/min (ref >60)
Glucose, Bld: 132 mg/dL — ABNORMAL HIGH (ref 70–99)
Potassium: 4 mmol/L (ref 3.5–5.1)
Sodium: 137 mmol/L (ref 135–145)
Total Bilirubin: 0.5 mg/dL (ref 0.0–1.2)
Total Protein: 7.5 g/dL (ref 6.5–8.1)

## 2023-11-03 LAB — URINALYSIS, ROUTINE W REFLEX MICROSCOPIC
Bilirubin Urine: NEGATIVE
Glucose, UA: NEGATIVE mg/dL
Hgb urine dipstick: NEGATIVE
Ketones, ur: NEGATIVE mg/dL
Leukocytes,Ua: NEGATIVE
Nitrite: NEGATIVE
Protein, ur: NEGATIVE mg/dL
Specific Gravity, Urine: >1.046 — ABNORMAL HIGH (ref 1.005–1.030)
pH: 5 (ref 5.0–8.0)

## 2023-11-03 LAB — CBC
HCT: 42.7 % (ref 39.0–52.0)
Hemoglobin: 14.1 g/dL (ref 13.0–17.0)
MCH: 30.6 pg (ref 26.0–34.0)
MCHC: 33 g/dL (ref 30.0–36.0)
MCV: 92.6 fL (ref 80.0–100.0)
Platelets: 210 10*3/uL (ref 150–400)
RBC: 4.61 MIL/uL (ref 4.22–5.81)
RDW: 13.6 % (ref 11.5–15.5)
WBC: 7.7 10*3/uL (ref 4.0–10.5)
nRBC: 0 % (ref 0.0–0.2)

## 2023-11-03 LAB — RESP PANEL BY RT-PCR (RSV, FLU A&B, COVID)  RVPGX2
Influenza A by PCR: NEGATIVE
Influenza B by PCR: NEGATIVE
Resp Syncytial Virus by PCR: NEGATIVE
SARS Coronavirus 2 by RT PCR: NEGATIVE

## 2023-11-03 LAB — SAMPLE TO BLOOD BANK

## 2023-11-03 LAB — LIPASE, BLOOD: Lipase: 92 U/L — ABNORMAL HIGH (ref 11–51)

## 2023-11-03 MED ORDER — KETOROLAC TROMETHAMINE 15 MG/ML IJ SOLN
30.0000 mg | Freq: Once | INTRAMUSCULAR | Status: AC
  Administered 2023-11-03: 30 mg via INTRAVENOUS
  Filled 2023-11-03: qty 2

## 2023-11-03 MED ORDER — LOPERAMIDE HCL 2 MG PO TABS
2.0000 mg | ORAL_TABLET | Freq: Four times a day (QID) | ORAL | 0 refills | Status: AC | PRN
Start: 2023-11-03 — End: ?

## 2023-11-03 MED ORDER — IOHEXOL 300 MG/ML  SOLN
100.0000 mL | Freq: Once | INTRAMUSCULAR | Status: AC | PRN
  Administered 2023-11-03: 100 mL via INTRAVENOUS

## 2023-11-03 MED ORDER — ONDANSETRON HCL 4 MG/2ML IJ SOLN
4.0000 mg | Freq: Once | INTRAMUSCULAR | Status: AC
  Administered 2023-11-03: 4 mg via INTRAVENOUS
  Filled 2023-11-03: qty 2

## 2023-11-03 MED ORDER — ONDANSETRON 4 MG PO TBDP
4.0000 mg | ORAL_TABLET | Freq: Three times a day (TID) | ORAL | 0 refills | Status: AC | PRN
Start: 2023-11-03 — End: ?

## 2023-11-03 MED ORDER — SODIUM CHLORIDE 0.9 % IV BOLUS
1000.0000 mL | Freq: Once | INTRAVENOUS | Status: AC
  Administered 2023-11-03: 1000 mL via INTRAVENOUS

## 2023-11-03 NOTE — ED Provider Notes (Signed)
 [facility] Provider Note    Event Date/Time   First MD Initiated Contact with Patient 11/03/23 [phone]     (approximate)   History   Abdominal Pain  Pt to ED via POV from home. Pt reports increased metallic taste in mouth and bright red rectal bleeding that yesterday turned to black tarry stool. Pt reports lower abd pain. 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    HPI [name] [name] is a 48 y.o. male PMH CAD, T2DM, hyperlipidemia, hematochezia, presents for evaluation of nausea, diarrhea, rectal bleeding - Patient states he had about 5-10 episodes of diarrhea yesterday.  Did take Pepto-Bismol with minimal relief though noticed his stools started getting quite dark.  No recent travel or antibiotics.  Did notice some episodes of bright red blood within the stool. - Has also had moderate bilateral lower quadrant pain since yesterday - Some nausea with metallic taste in his mouth today but no vomiting - Does endorse frequent marijuana use - No known sick contacts - endorses hx of hemorrhoids - no urinary symptoms       Physical Exam   Triage Vital Signs: ED Triage Vitals [11/03/23 0723]  Encounter Vitals Group     BP 121/73     Systolic BP Percentile      Diastolic BP Percentile      Pulse Rate 99     Resp 20     Temp 98.1 F (36.7 C)     Temp Source Oral     SpO2 100 %     Weight      Height      Head Circumference      Peak Flow      Pain Score 8     Pain Loc      Pain Education      Exclude from Growth Chart     Most recent vital signs: Vitals:   11/03/23 0740 11/03/23 0745  BP: 116/82   Pulse: 90 89  Resp:    Temp:    SpO2:  99%     General: Awake, no distress.  CV:  Good peripheral perfusion. RRR, RP 2+ Resp:  Normal effort. CTAB Abd:  No distention.  Mild-moderate tenderness to palpation bilateral lower quadrants, no significant suprapubic tenderness Rectal:  No external hemorrhoids, possible mild  internal hemorrhoids.  No masses.  Dark brown stool which is Hemoccult negative.   ED Results / Procedures / Treatments   Labs (all labs ordered are listed, but only abnormal results are displayed) Labs Reviewed  LIPASE, BLOOD - Abnormal; Notable for the following components:      Result Value   Lipase 92 (*)    All other components within normal limits  COMPREHENSIVE METABOLIC PANEL WITH GFR - Abnormal; Notable for the following components:   CO2 20 (*)    Glucose, Bld 132 (*)    All other components within normal limits  URINALYSIS, ROUTINE W REFLEX MICROSCOPIC - Abnormal; Notable for the following components:   Color, Urine YELLOW (*)    APPearance CLEAR (*)    Specific Gravity, Urine >1.046 (*)    All other components within normal limits  RESP PANEL BY RT-PCR (RSV, FLU A&B, COVID)  [id number]  CBC  SAMPLE TO BLOOD BANK     EKG  N/a   RADIOLOGY Radiology interpreted by myself and radiology report reviewed.    PROCEDURES:  Critical Care performed: No  Procedures   MEDICATIONS ORDERED IN  ED: Medications  ketorolac  (TORADOL ) 15 MG/ML injection 30 mg (30 mg Intravenous Given 11/03/23 0837)  sodium chloride  0.9 % bolus 1,000 mL (1,000 mLs Intravenous New Bag/Given 11/03/23 0836)  ondansetron  (ZOFRAN ) injection 4 mg (4 mg Intravenous Given 11/03/23 0836)  iohexol  (OMNIPAQUE ) 300 MG/ML solution 100 mL (100 mLs Intravenous Contrast Given 11/03/23 0857)     IMPRESSION / MDM / ASSESSMENT AND PLAN / ED COURSE  I reviewed the triage vital signs and the nursing notes.                              DDX/MDM/AP: Differential diagnosis includes, but is not limited to, gastroenteritis, diverticulitis, less likely appendicitis.  Consider cannabis hyperemesis.  Screening labs with improved hemoglobin, normal BUN, highly doubt upper GI bleed given negative Hemoccult on my exam.  Consider viral syndrome such as influenza or COVID-19.  Doubt urinary pathology.  Do not clinically  suspect C. difficile.  Suspect some component of dehydration.  Plan: - Screening labs from triage show no anemia, normal BUN.  No significant electrolyte abnormalities.  Hepatobiliary labs normal. - Zofran , IV fluid - Declines need for morphine , will trial Toradol  - CT abdomen pelvis - Counseled on possibility of cannabis hyperemesis and marijuana reduction/cessation  Patient's presentation is most consistent with acute presentation with potential threat to life or bodily function.    ED course below.  Workup unremarkable, no evidence of GI bleeding here in emergency department, suspect stool discoloration due to Pepto-Bismol.  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  Labs stable.  Symptoms controlled here in emergency department.  Overall suspect likely gastroenteritis, consider some component of marijuana side effect.  Rx Imodium , Zofran .  Counseled on marijuana cessation.  Will continue to monitor stools closely.  Plan for PMD follow-up.  ED return precautions in place.  Patient agrees with plan.  Clinical Course as of 11/03/23 1100  Wed Nov 03, 2023  0816 CBC reviewed, unremarkable, hemoglobin improved from prior.  CMP reviewed, also unremarkable.  BUN normal.  Lipase mildly elevated, nonspecific. [MM]  0934 CTAP: IMPRESSION: 1. No acute intra-abdominal or intrapelvic process. Please note that a GI bleeding protocol was not performed for the study, and if active gastrointestinal bleeding remains a concern a nuclear medicine tagged red blood cell scan could be considered. 2. 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 3. 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 The right-sided hydronephrosis and hydroureter seen on prior exam have resolved  in the interim. 4.  Aortic Atherosclerosis (ICD10-I70.0).   [MM]  1014 Viral swab neg [MM]  1036 Reevaluated, pain has resolved.  Discussed reassuring CT findings.  Will plan to discharge on Imodium  and Zofran .  He is already on pantoprazole , will continue this.  No evidence of GI bleeding here today, suspect dark stools are secondary to Pepto-Bismol though counseled on continuing to monitor this with ED return precautions in place.  Reiterated marijuana reduction/cessation as this may be contributing to symptoms as well.  Will provide urine sample now, plan for discharge home after results. [MM]  1057 Urinalysis not consistent with infection, will proceed with discharge home [MM]    Clinical Course User Index [MM] Collis Deaner, MD     FINAL CLINICAL IMPRESSION(S) / ED DIAGNOSES  Final diagnoses:  Lower abdominal pain  Diarrhea, unspecified type  Nausea     Rx / DC Orders   ED Discharge Orders          Ordered    loperamide (IMODIUM A-D) 2 MG tablet  4 times daily PRN        11/03/23 1016    ondansetron  (ZOFRAN -ODT) 4 MG disintegrating tablet  Every 8 hours PRN        11/03/23 1016             Note:  This document was prepared using Dragon voice recognition software and may include unintentional dictation errors.   Collis Deaner, MD 11/03/23 1100

## 2023-11-03 NOTE — ED Notes (Signed)
 Pt states that his lower abd has been bothering him for the past few days, states that he started having diarrhea and passing bright red blood, states now the stool looks like coffee grounds, also states that he has been taking pepto bismol over the days as well and his last dose was at 0400 today. Pt has been having intermittent issues with his abd for the past year

## 2023-11-03 NOTE — Discharge Instructions (Addendum)
 Your evaluation in the emergency department was overall reassuring.  I have prescribed you medication to use as needed for your diarrhea, and have also prescribed you a nausea medication.  I suspect you may have a viral illness, though it is also very possible that your marijuana use may be contributing to your abdominal discomfort and vomiting episodes--I strongly recommend you try decreasing or stopping your marijuana use to see if this helps.  Please do follow-up with your primary care provider for reevaluation, and return to the emergency department with any new or worsening symptoms.

## 2023-11-03 NOTE — ED Triage Notes (Signed)
 Pt to ED via POV from home. Pt reports increased metallic taste in mouth and bright red rectal bleeding that yesterday turned to black tarry stool. Pt reports lower abd pain. 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

## 2023-11-07 ENCOUNTER — Other Ambulatory Visit: Payer: Self-pay | Admitting: Family Medicine

## 2023-11-07 DIAGNOSIS — E1140 Type 2 diabetes mellitus with diabetic neuropathy, unspecified: Secondary | ICD-10-CM

## 2023-11-08 ENCOUNTER — Telehealth: Payer: Self-pay | Admitting: Family Medicine

## 2023-11-08 ENCOUNTER — Other Ambulatory Visit: Payer: Self-pay

## 2023-11-08 DIAGNOSIS — M51379 Other intervertebral disc degeneration, lumbosacral region without mention of lumbar back pain or lower extremity pain: Secondary | ICD-10-CM

## 2023-11-08 DIAGNOSIS — N401 Enlarged prostate with lower urinary tract symptoms: Secondary | ICD-10-CM

## 2023-11-08 MED ORDER — GABAPENTIN 600 MG PO TABS: 600.0000 mg | ORAL_TABLET | Freq: Three times a day (TID) | ORAL | 3 refills | Status: DC

## 2023-11-08 MED ORDER — TAMSULOSIN HCL 0.4 MG PO CAPS: 0.8000 mg | ORAL_CAPSULE | Freq: Every day | ORAL | 3 refills | Status: AC

## 2023-11-08 MED ORDER — CYCLOBENZAPRINE HCL 10 MG PO TABS: 10.0000 mg | ORAL_TABLET | Freq: Three times a day (TID) | ORAL | 1 refills | Status: AC | PRN

## 2023-11-08 NOTE — Telephone Encounter (Signed)
 Refilled request sent to provider to review

## 2023-11-08 NOTE — Telephone Encounter (Addendum)
 [facility] pharmacy faxed refill request for the following medications:  Losartan  25 mg tablets (not on refill ned list)  gabapentin  (NEURONTIN ) 600 MG tablet    glipiZIDE  (GLUCOTROL  XL) 5 MG 24 hr tablet     tamsulosin  (FLOMAX ) 0.4 MG CAPS capsule   Please advise

## 2023-11-12 ENCOUNTER — Other Ambulatory Visit: Payer: Self-pay | Admitting: Family Medicine

## 2023-11-12 DIAGNOSIS — E1140 Type 2 diabetes mellitus with diabetic neuropathy, unspecified: Secondary | ICD-10-CM

## 2023-11-12 NOTE — Telephone Encounter (Signed)
 [facility] pharmacy is requesting refill glipiZIDE  (GLUCOTROL  XL) 5 MG 24 hr tablet  Please advise

## 2023-11-12 NOTE — Telephone Encounter (Signed)
 [facility] pharmacy is requesting refill Losartan  25mg  tablets Not on current med list Please advise

## 2023-11-12 NOTE — Telephone Encounter (Signed)
 Please review. I have pended medication for Glipizide  but I do not see Losartan  25 mg not on med list

## 2023-11-14 MED ORDER — GLIPIZIDE ER 5 MG PO TB24
5.0000 mg | ORAL_TABLET | Freq: Two times a day (BID) | ORAL | 2 refills | Status: DC
Start: 2023-11-14 — End: 2024-04-25

## 2023-11-15 ENCOUNTER — Encounter: Payer: Self-pay | Admitting: Family Medicine

## 2023-11-15 ENCOUNTER — Ambulatory Visit (INDEPENDENT_AMBULATORY_CARE_PROVIDER_SITE_OTHER): Admitting: Family Medicine

## 2023-11-15 ENCOUNTER — Telehealth: Payer: Self-pay | Admitting: Family Medicine

## 2023-11-15 VITALS — BP 120/79 | HR 71 | Ht 75.0 in | Wt 267.5 lb

## 2023-11-15 DIAGNOSIS — K921 Melena: Secondary | ICD-10-CM

## 2023-11-15 DIAGNOSIS — R1084 Generalized abdominal pain: Secondary | ICD-10-CM | POA: Diagnosis not present

## 2023-11-15 DIAGNOSIS — I152 Hypertension secondary to endocrine disorders: Secondary | ICD-10-CM

## 2023-11-15 DIAGNOSIS — E1159 Type 2 diabetes mellitus with other circulatory complications: Secondary | ICD-10-CM | POA: Diagnosis not present

## 2023-11-15 DIAGNOSIS — E1142 Type 2 diabetes mellitus with diabetic polyneuropathy: Secondary | ICD-10-CM | POA: Diagnosis not present

## 2023-11-15 LAB — POCT GLYCOSYLATED HEMOGLOBIN (HGB A1C)
Est. average glucose Bld gHb Est-mCnc: 143
Hemoglobin A1C: 6.6 % — AB (ref 4.0–5.6)

## 2023-11-15 NOTE — Telephone Encounter (Signed)
 [facility] pharmacy faxed refill request for the following medication:    Losartan  25MG  Tablets ( not on refill list)    Please advise

## 2023-11-15 NOTE — Progress Notes (Signed)
 Established patient visit   Patient: [name] [name]   DOB: 04/08/1976   48 y.o. Male  MRN: [id number] Visit Date: 11/15/2023  Today's healthcare provider: Jeralene Mom, MD   Chief Complaint  Patient presents with   Nevus    On the top of left foot   Follow-up    Patient was seen at the ED 11/03/23 for lower abdominal pain   Subjective    Discussed the use of AI scribe software for clinical note transcription with the patient, who gave verbal consent to proceed.  History of Present Illness   [name] [name] is a 48 year old male who presents with gastrointestinal symptoms and concerns about medication side effects.  He has been experiencing gastrointestinal symptoms, including a metallic taste, nausea, vomiting, and diarrhea, which he thinks may be related to omeprazole . Was apparently started on PPI during cardiac work up several ago, but reports he has not had trouble with heartburn or reflux in the past. he did stop taking the omeprazole  after recent ER visit and denies any heartburn or reflux sx since then.    He has visited the emergency room twice this year due to these stomach issues with unremarkable work up. He did have blood per rectum prior to ER visit last week thought to be due to hemorrhoid. Last colonoscopy was in 2022 with excision of two low risk adenomas and visualization of internal hemorrhoids.   He reports a stinging sensation in a mole on his foot, present for a couple of years. The stinging occurs primarily in the morning and causes the mole to feel slightly raised, though it appears unchanged in size. No recent trauma or insect bites to the area.  He is also due for follow up type 2 diabetes. He monitors his blood sugar regularly, reporting levels around 119, and has been focusing on exercise and portion control in his diet. No chest pain, shortness of breath, or palpitations.     Lab Results  Component Value Date   HGBA1C 6.6 (A) 11/15/2023   HGBA1C  7.1 (H) 07/21/2023   HGBA1C 7.2 03/03/2023   Lab Results  Component Value Date   NA 137 11/03/2023   K 4.0 11/03/2023   CREATININE 1.20 11/03/2023   GFRNONAA >60 11/03/2023   GLUCOSE 132 (H) 11/03/2023     Medications: Outpatient Medications Prior to Visit  Medication Sig   Accu-Chek Softclix Lancets lancets Use to check blood sugar daily for type 2 diabetes. E11.9   amLODipine  (NORVASC ) 10 MG tablet TAKE 1 TABLET(10 MG) BY MOUTH DAILY   aspirin  81 MG tablet Take 81 mg by mouth daily.   blood glucose meter kit and supplies Dispense based on patient and insurance preference. Use daily. (FOR ICD-10 E10.9, E11.9).   Blood Glucose Monitoring Suppl DEVI 1 each by Does not apply route in the morning, at noon, and at bedtime. May substitute to any manufacturer covered by patient's insurance.   Blood Glucose Monitoring Suppl DEVI 1 each by Does not apply route in the morning, at noon, and at bedtime. May substitute to any manufacturer covered by patient's insurance.   Continuous Glucose Receiver (FREESTYLE LIBRE 3 READER) DEVI Use to check blood sugar for type 2 diabetes on insulin    Continuous Glucose Sensor (FREESTYLE LIBRE 3 SENSOR) MISC Place 1 sensor on the skin every 14 days. Use to check glucose continuously   cyclobenzaprine  (FLEXERIL ) 10 MG tablet Take 1 tablet (10 mg total) by mouth 3 (three) times  daily as needed.   diazepam  (VALIUM ) 5 MG tablet Take 1 tablet (5 mg total) by mouth once as needed for up to 1 dose for anxiety. 60 minutes prior to MRI. Repeat x1 if needed just before MRI. MUST HAVE SOMEONE ELSE DRIVE YOU.   ezetimibe  (ZETIA ) 10 MG tablet TAKE 1 TABLET(10 MG) BY MOUTH DAILY   gabapentin  (NEURONTIN ) 600 MG tablet Take 1 tablet (600 mg total) by mouth 3 (three) times daily.   glipiZIDE  (GLUCOTROL  XL) 5 MG 24 hr tablet Take 1 tablet (5 mg total) by mouth 2 (two) times daily.   glucose blood (FREESTYLE LITE) test strip USE IN THE MORNING, AT NOON, AND AT BEDTIME    HYDROcodone -acetaminophen  (NORCO/VICODIN) 5-325 MG tablet Take 1-2 tablets by mouth every 6 (six) hours as needed for moderate pain (pain score 4-6).   Insulin  Pen Needle (PEN NEEDLES 5/16") 30G X 8 MM MISC 100 each by Does not apply route at bedtime.   loperamide  (IMODIUM  A-D) 2 MG tablet Take 1 tablet (2 mg total) by mouth 4 (four) times daily as needed for diarrhea or loose stools.   methocarbamol  (ROBAXIN ) 750 MG tablet Take 1 tablet (750 mg total) by mouth every 8 (eight) hours as needed for muscle spasms.   metoprolol  succinate (TOPROL -XL) 25 MG 24 hr tablet TAKE 1 TABLET(25 MG) BY MOUTH DAILY   nitroGLYCERIN  (NITROSTAT ) 0.4 MG SL tablet Place 1 tablet (0.4 mg total) under the tongue every 5 (five) minutes as needed for chest pain. Call 9-1-1 if you take 3 tablets.   ondansetron  (ZOFRAN -ODT) 4 MG disintegrating tablet Take 1 tablet (4 mg total) by mouth every 8 (eight) hours as needed for nausea or vomiting.   QUEtiapine  (SEROQUEL ) 50 MG tablet Take 1 tablet (50 mg total) by mouth at bedtime.   rosuvastatin  (CRESTOR ) 40 MG tablet TAKE 1 TABLET(40 MG) BY MOUTH DAILY   sitaGLIPtin  (JANUVIA ) 100 MG tablet Take 1 tablet (100 mg total) by mouth daily.   tamsulosin  (FLOMAX ) 0.4 MG CAPS capsule Take 2 capsules (0.8 mg total) by mouth daily.   omeprazole  (PRILOSEC) 40 MG capsule Take 1 capsule (40 mg total) by mouth daily.   No facility-administered medications prior to visit.   Review of Systems  Constitutional:  Negative for appetite change, chills and fever.  Respiratory:  Negative for chest tightness, shortness of breath and wheezing.   Cardiovascular:  Negative for chest pain and palpitations.  Gastrointestinal:  Negative for abdominal pain, nausea and vomiting.       Objective    BP 120/79 (BP Location: Left Arm, Patient Position: Sitting, Cuff Size: Large)   Pulse 71   Ht 6\' 3"  (1.905 m)   Wt 267 lb 8 oz (121.3 kg)   SpO2 99%   BMI 33.44 kg/m   Physical Exam   General  appearance: Mildly obese male, cooperative and in no acute distress Head: Normocephalic, without obvious abnormality, atraumatic Respiratory: Respirations even and unlabored, normal respiratory rate Extremities: All extremities are intact.  Skin: 2-3 punctate well circumscribed pigmented lesion dorsum of left foot. No discharge or surrounding erythema.  Psych: Appropriate mood and affect. Neurologic: Mental status: Alert, oriented to person, place, and time, thought content appropriate.   Results for orders placed or performed in visit on 11/15/23  POCT glycosylated hemoglobin (Hb A1C)  Result Value Ref Range   Hemoglobin A1C 6.6 (A) 4.0 - 5.6 %   Est. average glucose Bld gHb Est-mCnc 143      Assessment &  Plan        Abdominal pain Discontinued omeprazole  as he feels this may be source of intermittent abdominal pain no clear reflux symptoms since stopping.  - Monitor for recurrence of GERD symptoms. - Use ondansetron  as needed for nausea.  Hypertension Blood pressure well-controlled. Continue current medications.    Diabetes Mellitus Well controlled.  Continue current medications.    Skin Lesion on Foot Mole causes stinging sensation, no significant growth or change. Likely benign. Freezing may cause soreness, excision unnecessary unless symptoms worsen. - Consider dermatology referral if symptoms worsen or persist. - Advised against freezing due to potential soreness.   Hematochezia. Now resolved. Has occurred intermittently for a few years. H/o low risk colon adenoma in 2022, UTD on follow up screening.     Return for 4-5 months DM f/u.   Jeralene Mom, MD  [facility] [phone] (phone) [phone] (fax)  [facility]

## 2023-11-15 NOTE — Telephone Encounter (Signed)
 Is not on this medication anymore.

## 2023-11-15 NOTE — Telephone Encounter (Signed)
 Called [facility] pharmacy and made them aware that patient is not on this medication anymore.

## 2023-11-19 ENCOUNTER — Other Ambulatory Visit: Payer: Self-pay

## 2023-11-19 DIAGNOSIS — M25532 Pain in left wrist: Secondary | ICD-10-CM

## 2023-11-19 MED ORDER — HYDROCODONE-ACETAMINOPHEN 5-325 MG PO TABS: 1.0000 | ORAL_TABLET | Freq: Four times a day (QID) | ORAL | 0 refills | Status: DC | PRN

## 2023-11-19 NOTE — Telephone Encounter (Signed)
 Copied from CRM [id number]. Topic: Clinical - Prescription Issue >> Nov 19, 2023 10:32 AM [place] wrote: Reason for CRM: HYDROcodone -acetaminophen  (NORCO/VICODIN) 5-325 MG tablet- out of refills per pharmacy    ----------------------------------------------------------------------- From previous Reason for Contact - Med. Prior Auth: Reason for CRM:

## 2023-11-22 ENCOUNTER — Ambulatory Visit: Admitting: Pain Medicine

## 2023-11-28 DIAGNOSIS — Z789 Other specified health status: Secondary | ICD-10-CM | POA: Insufficient documentation

## 2023-11-28 DIAGNOSIS — Z79899 Other long term (current) drug therapy: Secondary | ICD-10-CM | POA: Insufficient documentation

## 2023-11-28 DIAGNOSIS — G894 Chronic pain syndrome: Secondary | ICD-10-CM | POA: Insufficient documentation

## 2023-11-28 DIAGNOSIS — M899 Disorder of bone, unspecified: Secondary | ICD-10-CM | POA: Insufficient documentation

## 2023-11-28 NOTE — Patient Instructions (Signed)

## 2023-11-28 NOTE — Progress Notes (Unsigned)
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   Annual physical exam 02/04/2022   Gastroesophageal reflux disease 02/04/2022   Type 2 diabetes mellitus with hyperglycemia, without long-term current use of insulin  (HCC) 02/04/2022   Essential hypertension 02/04/2022   Encounter for hepatitis C screening test for low risk patient 02/04/2022   Flank pain 02/04/2022   Hemorrhoids 02/04/2022   Supraspinatus tendinitis, left 04/22/2022   Uncontrolled diabetes mellitus with hypoglycemia (HCC) 09/24/2022   Chronic RLQ pain 09/24/2022   Scrotal pain 09/24/2022   Kidney stone on right side 09/24/2022   Small bowel obstruction (HCC) 01/28/2023   GERD without esophagitis 01/29/2023   AKI (acute kidney injury) (HCC) 01/29/2023   Acute pain of left wrist 04/15/2023   Past Medical History:  Diagnosis Date   ADHD (attention deficit hyperactivity disorder)     Allergy    Arthritis    DDD (degenerative disc disease), lumbar    Diabetes mellitus    Drug abuse (HCC)    GERD (gastroesophageal reflux disease)    History of GI bleed    HNP (herniated nucleus pulposus), lumbar    Myocardial infarction (HCC)    Obesity    Oxygen deficiency    Sleep apnea    Constitutional Exam  General appearance: Well nourished, well developed, and well hydrated. In no apparent acute distress There were no vitals filed for this visit. BMI Assessment: Estimated body mass index is 33.44 kg/m as calculated from the following:   Height as of 11/15/23: 6' 3 (1.905 m).   Weight as of 11/15/23: 267 lb 8 oz (  121.3 kg).  BMI interpretation table: BMI level Category Range association with higher incidence of chronic pain  <18 kg/m2 Underweight   18.5-24.9 kg/m2 Ideal body weight   25-29.9 kg/m2 Overweight Increased incidence by 20%  30-34.9 kg/m2 Obese (Class I) Increased incidence by 68%  35-39.9 kg/m2 Severe obesity (Class II) Increased incidence by 136%  >40 kg/m2 Extreme obesity (Class III) Increased incidence by 254%   Patient's current BMI Ideal Body weight  There is no height or weight on file to calculate BMI. Ideal body weight: 84.5 kg (186 lb 4.6 oz) Adjusted ideal body weight: 99.2 kg (218 lb 12.4 oz)   BMI Readings from Last 4 Encounters:  11/15/23 33.44 kg/m  07/21/23 33.22 kg/m  04/20/23 33.12 kg/m  04/14/23 33.32 kg/m   Wt Readings from Last 4 Encounters:  11/15/23 267 lb 8 oz (121.3 kg)  07/21/23 265 lb 12.8 oz (120.6 kg)  04/20/23 265 lb (120.2 kg)  04/14/23 259 lb 8 oz (117.7 kg)    Psych/Mental status: Alert, oriented x 3 (person, place, & time)       Eyes: PERLA Respiratory: No evidence of acute respiratory distress  Assessment  Primary Diagnosis & Pertinent Problem List: The primary encounter diagnosis was Chronic pain syndrome. Diagnoses of Pharmacologic therapy, Disorder of skeletal system, and Problems influencing health  status were also pertinent to this visit.  Visit Diagnosis (New problems to examiner): 1. Chronic pain syndrome   2. Pharmacologic therapy   3. Disorder of skeletal system   4. Problems influencing health status    Plan of Care (Initial workup plan)  Note: Mr. Buonocore was reminded that as per protocol, today's visit has been an evaluation only. We have not taken over the patient's controlled substance management.  Problem-specific plan: Assessment and Plan            Lab Orders  No laboratory test(s) ordered today   Imaging Orders  No imaging studies ordered today   Referral Orders  No referral(s) requested today   Procedure Orders    No procedure(s) ordered today   Pharmacotherapy (current): Medications ordered:  No orders of the defined types were placed in this encounter.  Medications administered during this visit: Elery Cadenhead had no medications administered during this visit.   Analgesic Pharmacotherapy:  Opioid Analgesics: For patients currently taking or requesting to take opioid analgesics, in accordance with [place]  Medical Board Guidelines, we will assess their risks and indications for the use of these substances. After completing our evaluation, we may offer recommendations, but we no longer take patients for medication management. The prescribing physician will ultimately decide, based on his/her training and level of comfort whether to adopt any of the recommendations, including whether or not to prescribe such medicines.  Membrane stabilizer: To be determined at a later time  Muscle relaxant: To be determined at a later time  NSAID: To be determined at a later time  Other analgesic(s): To be determined at a later time   Interventional management options: Mr. Adan was informed that there is no guarantee that he would be a candidate for interventional therapies. The decision will be based on the results of diagnostic studies, as well as Mr.  Moors's risk profile.  Procedure(s) under consideration:  Pending results of ordered studies     Interventional Therapies  Risk Factors  Considerations  Medical Comorbidities:     Planned  Pending:      Under consideration:   Pending   Completed: (Analgesic benefit)1  None at this time   Therapeutic  Palliative (PRN) options:   None established   Completed by other providers:   None reported  1(Analgesic benefit): Expressed in percentage (%). (Local anesthetic[LA] +/- sedation  L.A.Local Anesthetic  Steroid benefit  Ongoing benefit)   Provider-requested follow-up: No follow-ups on file.  Future Appointments  Date Time Provider Department Center  11/29/2023 11:00 AM Tanya Glisson, MD [facility] None   I discussed the assessment and treatment plan with the patient. The patient was provided an opportunity to ask questions and all were answered. The patient agreed with the plan and demonstrated an understanding of the instructions.  Patient advised to call back or seek an in-person evaluation if the symptoms or condition worsens.  Duration of encounter: *** minutes.  Total time on encounter, as per AMA guidelines included both the face-to-face and non-face-to-face time personally spent by the physician and/or other qualified health care professional(s) on the day of the encounter (includes time in activities that require the physician or other qualified health care professional and does not include time in activities normally performed by clinical staff). Physician's time may include the following activities when performed: Preparing to see the patient (e.g., pre-charting review of records, searching for previously ordered imaging, lab work, and nerve conduction tests) Review of prior analgesic pharmacotherapies. Reviewing PMP Interpreting ordered tests (e.g., lab work, imaging, nerve conduction tests) Performing post-procedure evaluations, including interpretation  of diagnostic procedures Obtaining and/or reviewing separately obtained history Performing a medically appropriate examination and/or evaluation Counseling and educating the patient/family/caregiver Ordering medications, tests, or procedures Referring and communicating with other health care professionals (when not separately reported) Documenting clinical information in the electronic or other health record Independently interpreting results (not separately reported) and communicating results to the patient/ family/caregiver Care coordination (not separately reported)  Note by: Glisson DELENA Tanya, MD (TTS and AI technology used. I apologize for any typographical errors that were not detected and corrected.) Date: 11/29/2023; Time: 2:49 PM

## 2023-11-29 ENCOUNTER — Ambulatory Visit (HOSPITAL_BASED_OUTPATIENT_CLINIC_OR_DEPARTMENT_OTHER): Admitting: Pain Medicine

## 2023-11-29 DIAGNOSIS — M47812 Spondylosis without myelopathy or radiculopathy, cervical region: Secondary | ICD-10-CM | POA: Insufficient documentation

## 2023-11-29 DIAGNOSIS — Z8781 Personal history of (healed) traumatic fracture: Secondary | ICD-10-CM | POA: Insufficient documentation

## 2023-11-29 DIAGNOSIS — M4306 Spondylolysis, lumbar region: Secondary | ICD-10-CM | POA: Insufficient documentation

## 2023-11-29 DIAGNOSIS — Z789 Other specified health status: Secondary | ICD-10-CM

## 2023-11-29 DIAGNOSIS — R937 Abnormal findings on diagnostic imaging of other parts of musculoskeletal system: Secondary | ICD-10-CM | POA: Insufficient documentation

## 2023-11-29 DIAGNOSIS — Z79899 Other long term (current) drug therapy: Secondary | ICD-10-CM

## 2023-11-29 DIAGNOSIS — R935 Abnormal findings on diagnostic imaging of other abdominal regions, including retroperitoneum: Secondary | ICD-10-CM | POA: Insufficient documentation

## 2023-11-29 DIAGNOSIS — M899 Disorder of bone, unspecified: Secondary | ICD-10-CM

## 2023-11-29 DIAGNOSIS — G8929 Other chronic pain: Secondary | ICD-10-CM

## 2023-11-29 DIAGNOSIS — Z91199 Patient's noncompliance with other medical treatment and regimen due to unspecified reason: Secondary | ICD-10-CM

## 2023-11-29 DIAGNOSIS — G894 Chronic pain syndrome: Secondary | ICD-10-CM

## 2023-12-19 NOTE — Progress Notes (Unsigned)
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 cam-type femoroacetabular impingement. 3. Mild tendinosis of the right hamstring origin. Moderate tendinosis of the left hamstring origin. 4. Mild disc height loss at L5-S1 with Modic 1 and 2 changes on either side of the disc. If there is further clinical concern, recommend evaluation with an MRI of the lumbar spine.   Electronically Signed By: Julaine Blanch M.D. On: 08/24/2023 12:15  Hip-R CT w contrast: No results found for this or any previous visit.  Hip-L CT w contrast: No results found for this or any previous visit.  Hip-R CT w/wo contrast: No results found for this or any previous visit.  Hip-L CT w/wo contrast: No results found  for this or any previous visit.  Hip-R CT wo contrast: No results found for this or any previous visit.  Hip-L CT wo contrast: No results found for this or  any previous visit.  Hip-R DG 2-3 views: No results found for this or any previous visit.  Hip-L DG 2-3 views: No results found for this or any previous visit.  Hip-R DG Arthrogram: No results found for this or any previous visit.  Hip-L DG Arthrogram: No results found for this or any previous visit.  Hip-B DG Bilateral: No results found for this or any previous visit.  Hip-B DG Bilateral (5V): No results found for this or any previous visit.   Knee Imaging: Knee-R MR w contrast: No results found for this or any previous visit.  Knee-L MR w/o contrast: No results found for this or any previous visit.  Knee-R MR w/wo contrast: No results found for this or any previous visit.  Knee-L MR w/wo contrast: No results found for this or any previous visit.  Knee-R MR wo contrast: No results found for this or any previous visit.  Knee-L MR wo contrast: No results found for this or any previous visit.  Knee-R CT w contrast: No results found for this or any previous visit.  Knee-L CT w contrast: No results found for this or any previous visit.  Knee-R CT w/wo contrast: No results found for this or any previous visit.  Knee-L CT w/wo contrast: No results found for this or any previous visit.  Knee-R CT wo contrast: No results found for this or any previous visit.  Knee-L CT wo contrast: No results found for this or any previous visit.  Knee-R DG 1-2 views: No results found for this or any previous visit.  Knee-L DG 1-2 views: No results found for this or any previous visit.  Knee-R DG 3 views: No results found for this or any previous visit.  Knee-L DG 3 views: No results found for this or any previous visit.  Knee-R DG 4 views: No results found for this or any previous visit.  Knee-L DG 4 views: No results found for this or any previous visit.  Knee-R DG Arthrogram: No results found for this or any previous visit.  Knee-L DG Arthrogram: No results found for this or any  previous visit.   Ankle Imaging: Ankle-R DG Complete: No results found for this or any previous visit.  Ankle-L DG Complete: No results found for this or any previous visit.   Foot Imaging: Foot-R DG Complete: No results found for this or any previous visit.  Foot-L DG Complete: No results found for this or any previous visit.   Elbow Imaging: Elbow-R DG Complete: No results found for this or any previous visit.  Elbow-L DG Complete: No results found for this or any previous visit.   Wrist Imaging: Wrist-R DG Complete: No results found for this or any previous visit.  Wrist-L DG Complete: No results found for this or any previous visit.   Hand Imaging: Hand-R DG Complete: No results found for this or any previous visit.  Hand-L DG Complete: No results found for this or any previous visit.   Complexity Note: Imaging results reviewed.                         ROS  Cardiovascular: {Hx; Cardiovascular History:210120525} Pulmonary or Respiratory: {Hx; Pumonary and/or Respiratory History:210120523} Neurological: {Hx; Neurological:210120504} Psychological-Psychiatric: {Hx; Psychological-Psychiatric History:210120512} Gastrointestinal: {Hx; Gastrointestinal:210120527} Genitourinary: {Hx;  Genitourinary:210120506} Hematological: {Hx; Hematological:210120510} Endocrine: {Hx; Endocrine history:210120509} Rheumatologic: {Hx; Rheumatological:210120530} Musculoskeletal: {Hx; Musculoskeletal:210120528} Work History: {Hx; Work history:210120514}  Allergies  [name] [name] is allergic to dulaglutide  and penicillins.  Laboratory Chemistry Profile   Renal Lab Results  Component Value Date   BUN 14 11/03/2023   CREATININE 1.20 11/03/2023   BCR 10 07/21/2023   GFR 113.69 11/23/2011   GFRAA 110 07/24/2020   GFRNONAA >60 11/03/2023   SPECGRAV 1.021 09/24/2022   PHUR 5.5 09/24/2022   PROTEINUR NEGATIVE 11/03/2023     Electrolytes Lab Results  Component Value Date   NA 137  11/03/2023   K 4.0 11/03/2023   CL 108 11/03/2023   CALCIUM  9.0 11/03/2023   MG 1.8 01/29/2023     Hepatic Lab Results  Component Value Date   AST 18 11/03/2023   ALT 24 11/03/2023   ALBUMIN  4.4 11/03/2023   ALKPHOS 49 11/03/2023   AMYLASE 97 07/24/2020   LIPASE 92 (H) 11/03/2023     ID Lab Results  Component Value Date   HIV Non Reactive 12/01/2022   SARSCOV2NAA NEGATIVE 11/03/2023   HCVAB NON REACTIVE 12/01/2022     Bone Lab Results  Component Value Date   TESTOFREE 7.5 07/21/2023   TESTOSTERONE  242 (L) 07/21/2023     Endocrine Lab Results  Component Value Date   GLUCOSE 132 (H) 11/03/2023   GLUCOSEU NEGATIVE 11/03/2023   HGBA1C 6.6 (A) 11/15/2023   TSH 0.733 07/21/2023   FREET4 1.25 02/04/2022   TESTOFREE 7.5 07/21/2023   TESTOSTERONE  242 (L) 07/21/2023     Neuropathy Lab Results  Component Value Date   HGBA1C 6.6 (A) 11/15/2023   HIV Non Reactive 12/01/2022     CNS No results found for: COLORCSF, APPEARCSF, RBCCOUNTCSF, WBCCSF, POLYSCSF, LYMPHSCSF, EOSCSF, PROTEINCSF, GLUCCSF, JCVIRUS, CSFOLI, IGGCSF, LABACHR, ACETBL   Inflammation (CRP: Acute  ESR: Chronic) Lab Results  Component Value Date   LATICACIDVEN 1.9 01/29/2023     Rheumatology No results found for: RF, ANA, LABURIC, URICUR, LYMEIGGIGMAB, LYMEABIGMQN, HLAB27   Coagulation Lab Results  Component Value Date   INR 0.89 10/13/2010   LABPROT 12.3 10/13/2010   APTT 32 10/13/2010   PLT 210 11/03/2023     Cardiovascular Lab Results  Component Value Date   TROPONINI <0.03 07/14/2017   HGB 14.1 11/03/2023   HCT 42.7 11/03/2023     Screening Lab Results  Component Value Date   SARSCOV2NAA NEGATIVE 11/03/2023   HCVAB NON REACTIVE 12/01/2022   HIV Non Reactive 12/01/2022     Cancer No results found for: CEA, CA125, LABCA2   Allergens No results found for: ALMOND, APPLE, ASPARAGUS, AVOCADO, BANANA, BARLEY, BASIL,  BAYLEAF, GREENBEAN, LIMABEAN, WHITEBEAN, BEEFIGE, REDBEET, BLUEBERRY, BROCCOLI, CABBAGE, MELON, CARROT, CASEIN, CASHEWNUT, CAULIFLOWER, CELERY     Note: Lab results reviewed.  PFSH  Drug: [name] [name]  reports current drug use. Frequency: 20.00 times per week. Drug: Marijuana. Alcohol:  reports that he does not currently use alcohol after a past usage of about 10.0 standard drinks of alcohol per week. Tobacco:  reports that he quit smoking about 6 years ago. His smoking use included cigarettes. He started smoking about 20 years ago. He has a 14 pack-year smoking history. He has been exposed to tobacco smoke. He has never used smokeless tobacco. 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 Family: family history includes Breast cancer in his mother; Diabetes in his father and mother; Heart disease in his father, paternal grandmother, and paternal uncle; Hypertension in his father, mother, and sister; Multiple myeloma in his mother.  Past Surgical History:  Procedure Laterality Date   ABSCESS DRAINAGE Right 2003   right arm   COLONOSCOPY WITH PROPOFOL  N/A 10/10/2020   Procedure: COLONOSCOPY WITH PROPOFOL ;  Surgeon: Therisa Bi, MD;  Location[facility] END[facility]COPY;  Service: Gastroenterology;  Laterality: N/A;   CORONARY STENT PLACEMENT  10/2010   LAD   CYSTOSCOPY  01/28/2023   Procedure: CYSTOSCOPY;  Surgeon: Francisca Redell BROCKS, MD;  Location: [facility] ORS;  Service: Urology;;   XI ROBOTIC ASSISTED LOWER ANTERIOR RESECTION N/A 01/28/2023   Procedure: XI ROBOTIC ASSISTED RETROPERITONEAL MASS EXCISION;   Surgeon: Jordis Laneta FALCON, MD;  Location: [facility] ORS;  Service: General;  Laterality: N/A;   Active Ambulatory Problems    Diagnosis Date Noted   Heartburn    Hyperlipidemia associated with type 2 diabetes mellitus (HCC)    CAD (coronary artery disease) 10/29/2010   Hematochezia 01/19/2018   Hyperlipidemia LDL goal <70 09/19/2018   Back pain 10/23/2011   Obstructive sleep apnea 09/14/2019   History of colonic polyps 10/15/2020   Substance abuse in remission (HCC) 10/29/2021   Hypertension associated with diabetes (HCC) 02/04/2022   Gastroesophageal reflux disease with esophagitis without hemorrhage 02/04/2022   Marijuana dependence (HCC) 02/04/2022   History of coronary angioplasty with insertion of stent 04/22/2022   DDD (degenerative disc disease), lumbosacral 10/28/2022   Retroperitoneal mass 12/01/2022   Hydrocele in adult 12/01/2022   Type 2 diabetes mellitus with peripheral neuropathy (HCC) 01/29/2023   Dyslipidemia 01/29/2023   BPH (benign prostatic hyperplasia) 01/29/2023   Low back pain potentially associated with radiculopathy 04/15/2023   Ataxic gait 04/15/2023   Hypogonadism in male 07/27/2023   Chronic pain syndrome 11/28/2023   Pharmacologic therapy 11/28/2023   Disorder of skeletal system 11/28/2023   Problems influencing health status 11/28/2023   Cervical spondylosis (C5-6) 11/29/2023   History of fracture of clavicle (Left) 11/29/2023   Abnormal MRI, lumbar spine (10/02/2023) 11/29/2023   Abnormal MRI, Hip (Left) (08/24/2023) 11/29/2023   Lumbar L5 pars defect (Bilateral) 11/29/2023   Resolved Ambulatory Problems    Diagnosis Date Noted   Essential hypertension    Atypical chest pain    Diabetes mellitus without complication (HCC) 10/01/2010   Heart palpitations 03/02/2011   Coronary artery disease involving native coronary artery of native heart without angina pectoris 09/19/2018   Class 2 severe obesity with serious comorbidity and body mass index (BMI) of  37.0 to 37.9 in adult [facility]) 03/22/2019   Diabetic polyneuropathy associated with type 2 diabetes mellitus (HCC) 03/22/2019   Poorly controlled diabetes mellitus (HCC) 09/14/2019   Rectal bleeding 09/25/2020   Tobacco abuse, in remission 10/29/2021   Type 2 diabetes mellitus with diabetic neuropathy, without long-term current use of insulin  (HCC) 10/29/2021   Annual physical exam 02/04/2022   Gastroesophageal reflux disease 02/04/2022   Type 2 diabetes mellitus with hyperglycemia, without long-term current use of insulin  (HCC) 02/04/2022   Essential hypertension 02/04/2022   Encounter for hepatitis C screening test for low risk patient 02/04/2022   Flank pain 02/04/2022   Hemorrhoids 02/04/2022   Supraspinatus tendinitis, left 04/22/2022   Uncontrolled diabetes mellitus with hypoglycemia (HCC) 09/24/2022   Chronic RLQ pain 09/24/2022  Scrotal pain 09/24/2022   Kidney stone on right side 09/24/2022   Small bowel obstruction (HCC) 01/28/2023   GERD without esophagitis 01/29/2023   AKI (acute kidney injury) (HCC) 01/29/2023   Acute pain of left wrist 04/15/2023   Past Medical History:  Diagnosis Date   ADHD (attention deficit hyperactivity disorder)    Allergy    Arthritis    DDD (degenerative disc disease), lumbar    Diabetes mellitus    Drug abuse (HCC)    GERD (gastroesophageal reflux disease)    History of GI bleed    HNP (herniated nucleus pulposus), lumbar    Myocardial infarction (HCC)    Obesity    Oxygen deficiency    Sleep apnea    Constitutional Exam  General appearance: Well nourished, well developed, and well hydrated. In no apparent acute distress There were no vitals filed for this visit. BMI Assessment: Estimated body mass index is 33.44 kg/m as calculated from the following:   Height as of 11/15/23: 6' 3 (1.905 m).   Weight as of 11/15/23: 267 lb 8 oz (121.3 kg).  BMI interpretation table: BMI level Category Range association with higher incidence of chronic  pain  <18 kg/m2 Underweight   18.5-24.9 kg/m2 Ideal body weight   25-29.9 kg/m2 Overweight Increased incidence by 20%  30-34.9 kg/m2 Obese (Class I) Increased incidence by 68%  35-39.9 kg/m2 Severe obesity (Class II) Increased incidence by 136%  >40 kg/m2 Extreme obesity (Class III) Increased incidence by 254%   Patient's current BMI Ideal Body weight  There is no height or weight on file to calculate BMI. Patient weight not recorded   BMI Readings from Last 4 Encounters:  11/15/23 33.44 kg/m  07/21/23 33.22 kg/m  04/20/23 33.12 kg/m  04/14/23 33.32 kg/m   Wt Readings from Last 4 Encounters:  11/15/23 267 lb 8 oz (121.3 kg)  07/21/23 265 lb 12.8 oz (120.6 kg)  04/20/23 265 lb (120.2 kg)  04/14/23 259 lb 8 oz (117.7 kg)    Psych/Mental status: Alert, oriented x 3 (person, place, & time)       Eyes: PERLA Respiratory: No evidence of acute respiratory distress  Assessment  Primary Diagnosis & Pertinent Problem List: The primary encounter diagnosis was Chronic pain syndrome. Diagnoses of Pharmacologic therapy, Disorder of skeletal system, and Problems influencing health status were also pertinent to this visit.  Visit Diagnosis (New problems to examiner): 1. Chronic pain syndrome   2. Pharmacologic therapy   3. Disorder of skeletal system   4. Problems influencing health status    Plan of Care (Initial workup plan)  Note: [name] [name] was reminded that as per protocol, today's visit has been an evaluation only. We have not taken over the patient's controlled substance management.  Problem-specific plan: Assessment and Plan            Lab Orders  No laboratory test(s) ordered today   Imaging Orders  No imaging studies ordered today   Referral Orders  No referral(s) requested today   Procedure Orders    No procedure(s) ordered today   Pharmacotherapy (current): Medications ordered:  No orders of the defined types were placed in this  encounter.  Medications administered during this visit: Edman Lipsey had no medications administered during this visit.   Analgesic Pharmacotherapy:  Opioid Analgesics: For patients currently taking or requesting to take opioid analgesics, in accordance with [place]  Medical Board Guidelines, we will assess their risks and indications for the use of these substances. After  completing our evaluation, we may offer recommendations, but we no longer take patients for medication management. The prescribing physician will ultimately decide, based on his/her training and level of comfort whether to adopt any of the recommendations, including whether or not to prescribe such medicines.  Membrane stabilizer: To be determined at a later time  Muscle relaxant: To be determined at a later time  NSAID: To be determined at a later time  Other analgesic(s): To be determined at a later time   Interventional management options: [name] [name] was informed that there is no guarantee that he would be a candidate for interventional therapies. The decision will be based on the results of diagnostic studies, as well as [name] [name]'s risk profile.  Procedure(s) under consideration:  Pending results of ordered studies     Interventional Therapies  Risk Factors  Considerations  Medical Comorbidities:     Planned  Pending:      Under consideration:   Pending   Completed: (Analgesic benefit)1  None at this time   Therapeutic  Palliative (PRN) options:   None established   Completed by other providers:   None reported  1(Analgesic benefit): Expressed in percentage (%). (Local anesthetic[LA] +/- sedation  L.A.Local Anesthetic  Steroid benefit  Ongoing benefit)   Provider-requested follow-up: No follow-ups on file.  Future Appointments  Date Time Provider Department Center  12/20/2023  9:00 AM Tanya Glisson, MD [facility] None   I discussed the assessment and treatment plan with the  patient. The patient was provided an opportunity to ask questions and all were answered. The patient agreed with the plan and demonstrated an understanding of the instructions.  Patient advised to call back or seek an in-person evaluation if the symptoms or condition worsens.  Duration of encounter: *** minutes.  Total time on encounter, as per AMA guidelines included both the face-to-face and non-face-to-face time personally spent by the physician and/or other qualified health care professional(s) on the day of the encounter (includes time in activities that require the physician or other qualified health care professional and does not include time in activities normally performed by clinical staff). Physician's time may include the following activities when performed: Preparing to see the patient (e.g., pre-charting review of records, searching for previously ordered imaging, lab work, and nerve conduction tests) Review of prior analgesic pharmacotherapies. Reviewing PMP Interpreting ordered tests (e.g., lab work, imaging, nerve conduction tests) Performing post-procedure evaluations, including interpretation of diagnostic procedures Obtaining and/or reviewing separately obtained history Performing a medically appropriate examination and/or evaluation Counseling and educating the patient/family/caregiver Ordering medications, tests, or procedures Referring and communicating with other health care professionals (when not separately reported) Documenting clinical information in the electronic or other health record Independently interpreting results (not separately reported) and communicating results to the patient/ family/caregiver Care coordination (not separately reported)  Note by: Glisson DELENA Tanya, MD (TTS and AI technology used. I apologize for any typographical errors that were not detected and corrected.) Date: 12/20/2023; Time: 7:05 PM

## 2023-12-19 NOTE — Patient Instructions (Signed)

## 2023-12-20 ENCOUNTER — Ambulatory Visit (HOSPITAL_BASED_OUTPATIENT_CLINIC_OR_DEPARTMENT_OTHER): Admitting: Pain Medicine

## 2023-12-20 DIAGNOSIS — G894 Chronic pain syndrome: Secondary | ICD-10-CM

## 2023-12-20 DIAGNOSIS — Z789 Other specified health status: Secondary | ICD-10-CM

## 2023-12-20 DIAGNOSIS — G8929 Other chronic pain: Secondary | ICD-10-CM

## 2023-12-20 DIAGNOSIS — M899 Disorder of bone, unspecified: Secondary | ICD-10-CM

## 2023-12-20 DIAGNOSIS — Z79899 Other long term (current) drug therapy: Secondary | ICD-10-CM

## 2023-12-20 DIAGNOSIS — Z91199 Patient's noncompliance with other medical treatment and regimen due to unspecified reason: Secondary | ICD-10-CM

## 2023-12-26 NOTE — Progress Notes (Unsigned)
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 polyneuropathy associated with type 2 diabetes mellitus (HCC) 03/22/2019   Poorly controlled diabetes mellitus (HCC) 09/14/2019   Rectal bleeding 09/25/2020   Tobacco abuse, in remission 10/29/2021   Type 2 diabetes mellitus with diabetic neuropathy, without long-term current use of insulin  (HCC) 10/29/2021   Annual physical exam 02/04/2022   Gastroesophageal reflux disease 02/04/2022   Type 2 diabetes mellitus with hyperglycemia, without long-term current use of insulin  (HCC) 91/69/7976   Essential hypertension 02/04/2022   Encounter for hepatitis C screening test for low risk patient 02/04/2022   Flank pain 02/04/2022   Hemorrhoids 02/04/2022   Supraspinatus tendinitis, left 04/22/2022   Uncontrolled diabetes mellitus with hypoglycemia (HCC) 09/24/2022   Chronic RLQ pain 09/24/2022    Scrotal pain 09/24/2022   Kidney stone on right side 09/24/2022   Small bowel obstruction (HCC) 01/28/2023   GERD without esophagitis 01/29/2023   AKI (acute kidney injury) (HCC) 01/29/2023   Acute pain of left wrist 04/15/2023   Past Medical History:  Diagnosis Date   ADHD (attention deficit hyperactivity disorder)    Allergy    Arthritis    DDD (degenerative disc disease), lumbar    Diabetes mellitus    Drug abuse (HCC)    GERD (gastroesophageal reflux disease)    History of GI bleed    HNP (herniated nucleus pulposus), lumbar    Myocardial infarction (HCC)    Obesity    Oxygen deficiency    Sleep apnea    Constitutional Exam  General appearance: Well nourished, well developed, and well hydrated. In no apparent acute distress  There were no vitals filed for this visit. BMI Assessment: Estimated body mass index is 33.44 kg/m as calculated from the following:   Height as of 11/15/23: 6' 3 (1.905 m).   Weight as of 11/15/23: 267 lb 8 oz (121.3 kg).  BMI interpretation table: BMI level Category Range association with higher incidence of chronic pain  <18 kg/m2 Underweight   18.5-24.9 kg/m2 Ideal body weight   25-29.9 kg/m2 Overweight Increased incidence by 20%  30-34.9 kg/m2 Obese (Class I) Increased incidence by 68%  35-39.9 kg/m2 Severe obesity (Class II) Increased incidence by 136%  >40 kg/m2 Extreme obesity (Class III) Increased incidence by 254%   Patient's current BMI Ideal Body weight  There is no height or weight on file to calculate BMI. Patient weight not recorded   BMI Readings from Last 4 Encounters:  11/15/23 33.44 kg/m  07/21/23 33.22 kg/m  04/20/23 33.12 kg/m  04/14/23 33.32 kg/m   Wt Readings from Last 4 Encounters:  11/15/23 267 lb 8 oz (121.3 kg)  07/21/23 265 lb 12.8 oz (120.6 kg)  04/20/23 265 lb (120.2 kg)  04/14/23 259 lb 8 oz (117.7 kg)    Psych/Mental status: Alert, oriented x 3 (person, place, & time)       Eyes: PERLA Respiratory: No  evidence of acute respiratory distress  Assessment  Primary Diagnosis & Pertinent Problem List: The primary encounter diagnosis was Chronic pain syndrome. Diagnoses of Pharmacologic therapy, Disorder of skeletal system, and Problems influencing health status were also pertinent to this visit.  Visit Diagnosis (New problems to examiner): 1. Chronic pain syndrome   2. Pharmacologic therapy   3. Disorder of skeletal system   4. Problems influencing health status    Plan of Care (Initial workup plan)  Note: [name] [name] was reminded that as per protocol, today's visit has been an evaluation only. We have not taken over the patient's controlled substance management.  Problem-specific plan: Assessment and Plan            Lab Orders  No laboratory test(s) ordered today   Imaging Orders  No imaging studies ordered today   Referral Orders  No referral(s) requested today   Procedure Orders    No procedure(s) ordered today   Pharmacotherapy (current): Medications ordered:  No orders of the defined types were placed in this encounter.  Medications administered during this visit: Konrad Hoak had no medications administered during this visit.   Analgesic Pharmacotherapy:  Opioid Analgesics: For patients currently taking or requesting to take opioid analgesics, in accordance with [place]  Medical Board Guidelines, we will assess their risks and indications for the use of these substances. After completing our evaluation, we may offer recommendations, but we no longer take patients for medication management. The prescribing physician will ultimately decide, based on his/her training and level of comfort whether to adopt any of the recommendations, including whether or not to prescribe such medicines.  Membrane stabilizer: To be determined at a later time  Muscle relaxant: To be determined at a later time  NSAID: To be determined at a later time  Other analgesic(s): To be  determined at a later time   Interventional management options: [name] [name] was informed that there is no guarantee that he would be a candidate for interventional therapies. The decision will be based on the results of diagnostic studies, as well as [name] [name]'s risk profile.  Procedure(s) under consideration:  Pending results of ordered studies     Interventional Therapies  Risk Factors  Considerations  Medical Comorbidities:     Planned  Pending:      Under consideration:   Pending   Completed: (Analgesic benefit)1  None at this time   Therapeutic  Palliative (PRN) options:   None established   Completed by other providers:   None reported  1(Analgesic benefit): Expressed in percentage (%). (Local anesthetic[LA] +/- sedation  L.A.Local Anesthetic  Steroid benefit  Ongoing benefit)   Provider-requested follow-up: No follow-ups on file.  Future Appointments  Date Time Provider Department Center  12/27/2023  8:20 AM Tanya Glisson, MD [facility] None   I discussed the assessment and treatment plan with the patient. The patient was provided an opportunity to ask questions and all were answered. The patient agreed with the plan and demonstrated an understanding of the instructions.  Patient advised to call back or seek an in-person evaluation if the symptoms or condition worsens.  Duration of encounter: *** minutes.  Total time on encounter, as per AMA guidelines included both the face-to-face and non-face-to-face time personally spent by the physician and/or other qualified health care professional(s) on the day of the encounter (includes time in activities that require the physician or other qualified health care professional and does not include time in activities normally performed by clinical staff). Physician's time may include the following activities when performed: Preparing to see the patient (e.g., pre-charting review of records, searching for previously  ordered imaging, lab work, and nerve conduction tests) Review of prior analgesic pharmacotherapies. Reviewing PMP Interpreting ordered tests (e.g., lab work, imaging, nerve conduction tests) Performing post-procedure evaluations, including interpretation of diagnostic procedures Obtaining and/or reviewing separately obtained history Performing a medically appropriate examination and/or evaluation Counseling and educating the patient/family/caregiver Ordering medications, tests, or procedures Referring and communicating with other health care professionals (when not separately reported) Documenting clinical information in the electronic or other health record Independently interpreting results (not separately reported) and communicating results to the patient/ family/caregiver Care coordination (not separately reported)  Note by: Glisson DELENA Tanya, MD (TTS and AI technology used. I apologize for any typographical errors that were not detected and corrected.) Date: 12/27/2023; Time: 8:29 PM

## 2023-12-27 ENCOUNTER — Ambulatory Visit: Attending: Pain Medicine | Admitting: Pain Medicine

## 2023-12-27 ENCOUNTER — Encounter: Payer: Self-pay | Admitting: Pain Medicine

## 2023-12-27 VITALS — BP 137/88 | HR 71 | Temp 97.3°F | Resp 16 | Ht 75.0 in | Wt 270.0 lb

## 2023-12-27 DIAGNOSIS — M899 Disorder of bone, unspecified: Secondary | ICD-10-CM | POA: Insufficient documentation

## 2023-12-27 DIAGNOSIS — M25652 Stiffness of left hip, not elsewhere classified: Secondary | ICD-10-CM | POA: Insufficient documentation

## 2023-12-27 DIAGNOSIS — M5451 Vertebrogenic low back pain: Secondary | ICD-10-CM | POA: Diagnosis present

## 2023-12-27 DIAGNOSIS — M25551 Pain in right hip: Secondary | ICD-10-CM | POA: Diagnosis not present

## 2023-12-27 DIAGNOSIS — R1031 Right lower quadrant pain: Secondary | ICD-10-CM | POA: Insufficient documentation

## 2023-12-27 DIAGNOSIS — M545 Low back pain, unspecified: Secondary | ICD-10-CM | POA: Diagnosis present

## 2023-12-27 DIAGNOSIS — Z79899 Other long term (current) drug therapy: Secondary | ICD-10-CM | POA: Diagnosis not present

## 2023-12-27 DIAGNOSIS — M47816 Spondylosis without myelopathy or radiculopathy, lumbar region: Secondary | ICD-10-CM | POA: Insufficient documentation

## 2023-12-27 DIAGNOSIS — Z87828 Personal history of other (healed) physical injury and trauma: Secondary | ICD-10-CM | POA: Insufficient documentation

## 2023-12-27 DIAGNOSIS — M9904 Segmental and somatic dysfunction of sacral region: Secondary | ICD-10-CM | POA: Insufficient documentation

## 2023-12-27 DIAGNOSIS — R1032 Left lower quadrant pain: Secondary | ICD-10-CM | POA: Insufficient documentation

## 2023-12-27 DIAGNOSIS — G894 Chronic pain syndrome: Secondary | ICD-10-CM | POA: Insufficient documentation

## 2023-12-27 DIAGNOSIS — Z789 Other specified health status: Secondary | ICD-10-CM | POA: Insufficient documentation

## 2023-12-27 DIAGNOSIS — M533 Sacrococcygeal disorders, not elsewhere classified: Secondary | ICD-10-CM | POA: Diagnosis not present

## 2023-12-27 DIAGNOSIS — G8929 Other chronic pain: Secondary | ICD-10-CM | POA: Diagnosis present

## 2023-12-27 DIAGNOSIS — M25552 Pain in left hip: Secondary | ICD-10-CM | POA: Diagnosis not present

## 2023-12-27 DIAGNOSIS — M5459 Other low back pain: Secondary | ICD-10-CM | POA: Insufficient documentation

## 2023-12-27 NOTE — Progress Notes (Signed)
 Safety precautions to be maintained throughout the outpatient stay will include: orient to surroundings, keep bed in low position, maintain call bell within reach at all times, provide assistance with transfer out of bed and ambulation.

## 2023-12-27 NOTE — Patient Instructions (Signed)
 ______________________________________________________________________    New Patients  Welcome to [facility].   Initial Visit The first or initial visit consists of an evaluation only.   Interventional pain management.  This is an interventional pain management medical practice. This means that we offer therapies other than prescription controlled substances to manage chronic pain. These therapies may include, but are not limited to, diagnostic, therapeutic, and palliative specialized injection therapies (i.e.: Epidural Steroids, Facet Blocks, etc.). We specialize in a variety of nerve blocks as well as radiofrequency treatments. We offer pain implant evaluations and trials, as well as follow up management. In addition we also provide a variety joint injections, including Viscosupplementation (AKA: Gel Therapy).  Prescription Pain Medication. We specialize in alternatives to opioids.  Although we can provide evaluations and recommendations for/of pharmacologic therapies based on CDC Guidelines, we no longer take patients for long-term medication management. Please be clear that we will not be taking over the prescribing of your pain medications.  ______________________________________________________________________      ______________________________________________________________________    Patient Information update  To: All of our patients.  Re: Name change.  It has been made official that our current name, "[facility]"   will soon be changed to "[facility]".   The purpose of this change is to eliminate any confusion created by the concept of our practice being a "Medication Management Pain Clinic". In the past this has led to the misconception that we treat pain primarily by the use of prescription  medications.  Nothing can be farther from the truth.   Understanding PAIN MANAGEMENT: To further understand what our practice does, you first have to understand that "Pain Management" is a subspecialty that requires additional training once a physician has completed their specialty training, which can be in either Anesthesia, Neurology, Psychiatry, or Physical Medicine and Rehabilitation (PMR). Each one of these contributes to the final approach taken by each physician to the management of their patient's pain. To be a "Pain Management Specialist" you must have first completed one of the specialty trainings below.  Anesthesiologists - trained in clinical pharmacology and interventional techniques such as nerve blockade and regional as well as central neuroanatomy. They are trained to block pain before, during, and after surgical interventions.  Neurologists - trained in the diagnosis and pharmacological treatment of complex neurological conditions, such as Multiple Sclerosis, Parkinson's, spinal cord injuries, and other systemic conditions that may be associated with symptoms that may include but are not limited to pain. They tend to rely primarily on the treatment of chronic pain using prescription medications.  Psychiatrist - trained in conditions affecting the psychosocial wellbeing of patients including but not limited to depression, anxiety, schizophrenia, personality disorders, addiction, and other substance use disorders that may be associated with chronic pain. They tend to rely primarily on the treatment of chronic pain using prescription medications.   Physical Medicine and Rehabilitation (PMR) physicians, also known as physiatrists - trained to treat a wide variety of medical conditions affecting the brain, spinal cord, nerves, bones, joints, ligaments, muscles, and tendons. Their training is primarily aimed at treating patients that have suffered injuries that have caused severe physical  impairment. Their training is primarily aimed at the physical therapy and rehabilitation of those patients. They may also work alongside orthopedic surgeons or neurosurgeons using their expertise in assisting surgical patients to recover after their surgeries.  INTERVENTIONAL PAIN MANAGEMENT is  sub-subspecialty of Pain Management.  Our physicians are Board-certified in Anesthesia, Pain Management, and Interventional Pain Management.  This meaning that not only have they been trained and Board-certified in their specialty of Anesthesia, and subspecialty of Pain Management, but they have also received further training in the sub-subspecialty of Interventional Pain Management, in order to become Board-certified as INTERVENTIONAL PAIN MANAGEMENT SPECIALIST.    Mission: Our goal is to use our skills in  INTERVENTIONAL PAIN MANAGEMENT as alternatives to the chronic use of prescription opioid medications for the treatment of pain. To make this more clear, we have changed our name to reflect what we do and offer. We will continue to offer medication management assessment and recommendations, but we will not be taking over any patient's medication management.  ______________________________________________________________________

## 2023-12-29 ENCOUNTER — Ambulatory Visit: Admitting: Pain Medicine

## 2024-01-01 LAB — COMP. METABOLIC PANEL (12)
AST: 18 IU/L (ref 0–40)
Albumin: 4.9 g/dL (ref 4.1–5.1)
Alkaline Phosphatase: 68 IU/L (ref 44–121)
BUN/Creatinine Ratio: 11 (ref 9–20)
BUN: 14 mg/dL (ref 6–24)
Bilirubin Total: 0.3 mg/dL (ref 0.0–1.2)
Calcium: 10.3 mg/dL — ABNORMAL HIGH (ref 8.7–10.2)
Chloride: 103 mmol/L (ref 96–106)
Creatinine, Ser: 1.28 mg/dL — ABNORMAL HIGH (ref 0.76–1.27)
Globulin, Total: 3 g/dL (ref 1.5–4.5)
Glucose: 156 mg/dL — ABNORMAL HIGH (ref 70–99)
Potassium: 4.7 mmol/L (ref 3.5–5.2)
Sodium: 143 mmol/L (ref 134–144)
Total Protein: 7.9 g/dL (ref 6.0–8.5)
eGFR: 70 mL/min/1.73 (ref >59)

## 2024-01-01 LAB — 25-HYDROXY VITAMIN D LCMS D2+D3
25-Hydroxy, Vitamin D-2: <1 ng/mL
25-Hydroxy, Vitamin D-3: 32 ng/mL
25-Hydroxy, Vitamin D: 32 ng/mL

## 2024-01-01 LAB — MAGNESIUM: Magnesium: 2.1 mg/dL (ref 1.6–2.3)

## 2024-01-01 LAB — C-REACTIVE PROTEIN: CRP: <1 mg/L (ref 0–10)

## 2024-01-01 LAB — VITAMIN B12: Vitamin B-12: 580 pg/mL (ref 232–1245)

## 2024-01-01 LAB — SEDIMENTATION RATE: Sed Rate: 21 mm/h — ABNORMAL HIGH (ref 0–15)

## 2024-01-06 LAB — COMPLIANCE DRUG ANALYSIS, UR

## 2024-01-09 ENCOUNTER — Encounter: Payer: Self-pay | Admitting: Pain Medicine

## 2024-01-09 DIAGNOSIS — F1291 Cannabis use, unspecified, in remission: Secondary | ICD-10-CM | POA: Insufficient documentation

## 2024-01-09 DIAGNOSIS — R892 Abnormal level of other drugs, medicaments and biological substances in specimens from other organs, systems and tissues: Secondary | ICD-10-CM | POA: Insufficient documentation

## 2024-01-09 DIAGNOSIS — F1290 Cannabis use, unspecified, uncomplicated: Secondary | ICD-10-CM | POA: Insufficient documentation

## 2024-01-16 NOTE — Progress Notes (Signed)
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 6 years ago. His smoking use included cigarettes. He started smoking about 20 years ago. He has a 14 pack-year smoking history. He has been exposed to tobacco smoke. He has never used smokeless tobacco. 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 Surgical: [name] [name]  has a past surgical history that includes Abscess drainage (Right, 2003); Coronary stent  placement (10/2010); Colonoscopy with propofol  (N/A, 10/10/2020); XI robotic assisted lower anterior resection (N/A, 01/28/2023); and Cystoscopy (01/28/2023). Family: family history includes Breast cancer in his mother; Diabetes in his father and mother; Heart disease in his father, paternal grandmother, and paternal uncle; Hypertension in his father, mother, and sister; Multiple myeloma in his mother.  Constitutional Exam  General appearance: Well nourished, well developed, and well hydrated. In no apparent acute distress There were no vitals filed for this visit. BMI Assessment: Estimated body mass index is 33.75 kg/m as calculated from the following:   Height as of 12/27/23: 6' 3 (1.905 m).   Weight as of 12/27/23: 270 lb (122.5 kg).  BMI interpretation table: BMI level Category Range association with higher incidence of chronic pain  <18 kg/m2 Underweight   18.5-24.9 kg/m2 Ideal body weight   25-29.9 kg/m2 Overweight Increased incidence by 20%  30-34.9 kg/m2 Obese (Class I) Increased incidence by 68%  35-39.9 kg/m2 Severe obesity (Class II) Increased incidence by 136%  >40 kg/m2 Extreme obesity (Class III) Increased incidence by 254%   Patient's current BMI Ideal Body weight  There is no height or weight on file to calculate BMI. Patient weight not recorded   BMI Readings from Last 4 Encounters:  12/27/23 33.75 kg/m  11/15/23 33.44 kg/m  07/21/23 33.22 kg/m  04/20/23 33.12 kg/m   Wt Readings from Last 4 Encounters:  12/27/23 270 lb (122.5 kg)  11/15/23 267 lb 8 oz (121.3 kg)  07/21/23 265 lb 12.8 oz (120.6 kg)  04/20/23 265 lb (120.2 kg)    Psych/Mental status: Alert, oriented x 3 (person, place, & time)       Eyes: PERLA Respiratory: No evidence of acute respiratory distress  Assessment & Plan  Primary Diagnosis & Pertinent Problem List: The primary encounter diagnosis was Chronic low back pain (1ry area of Pain) (Bilateral) w/o sciatica. Diagnoses of Chronic hip pain  (2ry area of Pain) (Left), Low back pain of over 3 months duration, Low back pain potentially associated with radiculopathy, Lumbar facet joint pain, and Multifactorial low back pain were also pertinent to this visit. Visit Diagnosis: 1. Chronic low back pain (1ry area of Pain) (Bilateral) w/o sciatica   2. Chronic hip pain (2ry area of Pain) (Left)   3. Low back pain of over 3 months duration   4. Low back pain potentially associated with radiculopathy   5. Lumbar facet joint pain   6. Multifactorial low back pain    Problems updated and reviewed during this visit: No problems updated.  Plan of Care  Assessment and Plan             Pharmacotherapy (Medications Ordered): No orders of the defined types were placed in this encounter.  Procedure Orders    No procedure(s) ordered today   No orders of the defined types were placed in this encounter.  Lab Orders  No laboratory test(s) ordered today   Imaging Orders  No imaging studies ordered today   Referral Orders  No referral(s) requested today    Pharmacological management:  Opioid Analgesics: I will not be prescribing any opioids at this time Membrane stabilizer: I will not be prescribing any at this time Muscle relaxant: I will not be prescribing any at this time  NSAID: I will not be prescribing any at this time Other analgesic(s): I will not be prescribing any at this time      Interventional Therapies  Risk Factors  Considerations  Medical Comorbidities:     Planned  Pending:      Under consideration:   Pending   Completed: (Analgesic benefit)1  None at this time   Therapeutic  Palliative (PRN) options:   None established   Completed by other providers:   Diagnostic US -guided left IA (L.A. only) hip injection x1 (10/26/2023) by Prentice Reges, DO [facility]) Therapeutic right L5-S1 TFESI x1 (07/08/2018) by Morene Falcon, DO [facility])  Therapeutic left S1 transforaminal ESI x1 (07/08/2018) by  Morene Falcon, DO [facility])  Therapeutic bilateral L4-5 TFESI x1 (07/16/2023) by Morene Falcon, DO [facility])  Therapeutic left L4-5 TFESI x1 (08/11/2023) by Morene Falcon, DO ([facility])   1(Analgesic benefit): Expressed in percentage (%). (Local anesthetic[LA] +/- sedation  L.A.Local Anesthetic  Steroid benefit  Ongoing benefit)      Provider-requested follow-up: No follow-ups on file. Recent Visits Date Type Provider Dept  12/27/23 Office Visit Tanya Glisson, MD [facility]  Showing recent visits within past 90 days and meeting all other requirements Future Appointments Date Type Provider Dept  01/17/24 Appointment Tanya Glisson, MD [facility]  Showing future appointments within next 90 days and meeting all other requirements   Primary Care Physician: Gasper Nancyann BRAVO, MD  Duration of encounter: *** minutes.  Total time on encounter, as per AMA guidelines included both the face-to-face and non-face-to-face time personally spent by the physician and/or other qualified health care professional(s) on the day of the encounter (includes time in activities that require the physician or other qualified health care professional and does not include time in activities normally performed by clinical staff). Physician's time may include the following activities when performed: Preparing to see the patient (e.g., pre-charting review of records, searching for previously ordered imaging, lab work, and nerve conduction tests) Review of prior analgesic pharmacotherapies. Reviewing PMP Interpreting ordered tests (e.g., lab work, imaging, nerve conduction tests) Performing post-procedure evaluations, including interpretation of diagnostic procedures Obtaining and/or reviewing separately obtained history Performing a medically appropriate examination and/or evaluation Counseling and educating the patient/family/caregiver Ordering medications, tests, or  procedures Referring and communicating with other health care professionals (when not separately reported) Documenting clinical information in the electronic or other health record Independently interpreting results (not separately reported) and communicating results to the patient/ family/caregiver Care coordination (not separately reported)  Note by: Glisson DELENA Tanya, MD (TTS technology used. I apologize for any typographical errors that were not detected and corrected.) Date: 01/17/2024; Time: 2:40 PM

## 2024-01-17 ENCOUNTER — Ambulatory Visit
Admission: RE | Admit: 2024-01-17 | Discharge: 2024-01-17 | Disposition: A | Discharge status: Home / Self Care | Source: Ambulatory Visit | Attending: Pain Medicine | Admitting: Pain Medicine

## 2024-01-17 ENCOUNTER — Ambulatory Visit (HOSPITAL_BASED_OUTPATIENT_CLINIC_OR_DEPARTMENT_OTHER): Admitting: Pain Medicine

## 2024-01-17 DIAGNOSIS — M9904 Segmental and somatic dysfunction of sacral region: Secondary | ICD-10-CM | POA: Diagnosis present

## 2024-01-17 DIAGNOSIS — M545 Low back pain, unspecified: Secondary | ICD-10-CM

## 2024-01-17 DIAGNOSIS — M47816 Spondylosis without myelopathy or radiculopathy, lumbar region: Secondary | ICD-10-CM

## 2024-01-17 DIAGNOSIS — M5451 Vertebrogenic low back pain: Secondary | ICD-10-CM | POA: Insufficient documentation

## 2024-01-17 DIAGNOSIS — M5459 Other low back pain: Secondary | ICD-10-CM | POA: Insufficient documentation

## 2024-01-17 DIAGNOSIS — M533 Sacrococcygeal disorders, not elsewhere classified: Secondary | ICD-10-CM | POA: Diagnosis present

## 2024-01-17 DIAGNOSIS — G8929 Other chronic pain: Secondary | ICD-10-CM

## 2024-01-17 DIAGNOSIS — Z87828 Personal history of other (healed) physical injury and trauma: Secondary | ICD-10-CM | POA: Insufficient documentation

## 2024-01-17 DIAGNOSIS — M25551 Pain in right hip: Secondary | ICD-10-CM

## 2024-01-17 DIAGNOSIS — R1031 Right lower quadrant pain: Secondary | ICD-10-CM | POA: Diagnosis present

## 2024-01-17 DIAGNOSIS — Z538 Procedure and treatment not carried out for other reasons: Secondary | ICD-10-CM

## 2024-01-17 NOTE — Patient Instructions (Signed)

## 2024-04-21 ENCOUNTER — Ambulatory Visit: Payer: Self-pay

## 2024-04-21 ENCOUNTER — Encounter: Payer: Self-pay | Admitting: Family Medicine

## 2024-04-21 ENCOUNTER — Ambulatory Visit (INDEPENDENT_AMBULATORY_CARE_PROVIDER_SITE_OTHER): Admitting: Family Medicine

## 2024-04-21 VITALS — BP 130/86 | HR 84 | Temp 98.8°F | Ht 75.0 in | Wt 266.0 lb

## 2024-04-21 DIAGNOSIS — E1142 Type 2 diabetes mellitus with diabetic polyneuropathy: Secondary | ICD-10-CM | POA: Diagnosis not present

## 2024-04-21 DIAGNOSIS — M545 Low back pain, unspecified: Secondary | ICD-10-CM | POA: Diagnosis not present

## 2024-04-21 DIAGNOSIS — F1220 Cannabis dependence, uncomplicated: Secondary | ICD-10-CM | POA: Diagnosis not present

## 2024-04-21 DIAGNOSIS — K648 Other hemorrhoids: Secondary | ICD-10-CM

## 2024-04-21 DIAGNOSIS — Z7984 Long term (current) use of oral hypoglycemic drugs: Secondary | ICD-10-CM

## 2024-04-21 DIAGNOSIS — G8929 Other chronic pain: Secondary | ICD-10-CM

## 2024-04-21 DIAGNOSIS — R1115 Cyclical vomiting syndrome unrelated to migraine: Secondary | ICD-10-CM | POA: Diagnosis not present

## 2024-04-21 NOTE — Telephone Encounter (Signed)
 Copied from CRM [id number]. Topic: Clinical - Red Word Triage >> Apr 21, 2024  9:01 AM Charlet HERO wrote: Red Word that prompted transfer to Nurse Triage: Patient is calling stating that he is still throwing up and has diahreah he also has a metallica taste . FIsher   Call dropped, called back and left message to call back.

## 2024-04-21 NOTE — Telephone Encounter (Signed)
 FYI Only or Action Required?: FYI only for provider: appointment scheduled on 04/21/24.  Patient was last seen in primary care on 11/15/2023 by Gasper Nancyann BRAVO, MD.  Called Nurse Triage reporting GI Problem.  Symptoms began several days ago.  Interventions attempted: Rest, hydration, or home remedies.  Symptoms are: stable.  Triage Disposition: See Physician Within 24 Hours  Patient/caregiver understands and will follow disposition?: Yes    Copied from CRM #[id number]. Topic: Appointments - Appointment Scheduling >> Apr 21, 2024  8:50 AM Tonda B wrote: Patient/patient representative is calling to schedule an appointment. Refer to attachments for appointment information.  Pt has DIARRHEA/ VOMITING Reason for Disposition  [1] MILD or MODERATE vomiting AND [2] present > 48 hours (2 days)  (Exception: Mild vomiting with associated diarrhea.)  Answer Assessment - Initial Assessment Questions 1. VOMITING SEVERITY: How many times have you vomited in the past 24 hours?      7-8 2. ONSET: When did the vomiting begin?      Tuesday evening 3. FLUIDS: What fluids or food have you vomited up today? Have you been able to keep any fluids down?     Yes 4. ABDOMEN PAIN: Are your having any abdomen pain? If Yes : How bad is it and what does it feel like? (e.g., crampy, dull, intermittent, constant)      Only with episodes of vomiting and diarrhea  5. DIARRHEA: Is there any diarrhea? If Yes, ask: How many times today?      None today, 5-6 the last two days 6. CONTACTS: Is there anyone else in the family with the same symptoms?      None 7. CAUSE: What do you think is causing your vomiting?     Unknown, recently hospitalized for the same problem 8. HYDRATION STATUS: Any signs of dehydration? (e.g., dry mouth [not only dry lips], too weak to stand) When did you last urinate?     No concerns 9. OTHER SYMPTOMS: Do you have any other symptoms? (e.g., fever, headache, vertigo,  vomiting blood or coffee grounds, recent head injury)     Denies blood in vomit or stool  Protocols used: Vomiting-A-AH

## 2024-04-21 NOTE — Progress Notes (Signed)
 Established patient visit   Patient: [name] [name]   DOB: [birth year]   48 y.o. Male  MRN: [id number] Visit Date: 04/21/2024  Today's healthcare provider: LAURAINE LOISE BUOY, DO   Chief Complaint  Patient presents with   Acute Visit    Patient is here today because he is having some vomiting and diarrhea with bad stomach pain.  The last time it happened 6 months ago and he went admitted to the hospital.  Seems like it is something reoccurring and would like a referral for GI.   Subjective    HPI [name] [name] is a 48 year old male who presents with recurrent gastrointestinal symptoms.  He has been experiencing recurrent episodes of vomiting and diarrhea since before an ER visit in May 2025. These episodes intermittently resolved and recurred, including within the last week or two. Currently, he is not experiencing these symptoms but is concerned about further recurrence. The episodes can be debilitating, leaving him weak for three to four days, and he relies on Gatorade for recovery. No current nausea, vomiting, or diarrhea. No black stools since taking Pepto Bismol earlier this year. No blood or coffee ground-like material in vomit. He reports a terrible smell during vomiting episodes, described as 'rotten eggs' or 'metallic'.  He suspects that his symptoms might be related to his medication or dietary habits. He previously thought insulin  might be the cause, but after switching back to oral medications, the symptoms subsided. He also mentions a recent episode of heavy drinking at a wedding and wonders if this could be a contributing factor. Additionally, he smokes marijuana daily, which he notes helps him sleep.  He reports he has cut down significantly and even stopped for 60 days at one point, but he has resumed use since then.  He has a history of back issues and reports that previous x-rays were performed, but he has not received follow-up from the pain management team despite  completing the requested x-rays.   He has diabetes and reports occasional blurry vision, which he attributes to his diabetes. He last had an eye exam in 2022. He has made dietary changes, avoiding sodas and sweet tea, and exercises regularly to manage his diabetes.  He mentions a history of rectal bleeding, which was attributed to hemorrhoids, and a colonoscopy in 2022 that revealed two polyps, which were removed.       Medications: Outpatient Medications Prior to Visit  Medication Sig   Accu-Chek Softclix Lancets lancets Use to check blood sugar daily for type 2 diabetes. E11.9   amLODipine  (NORVASC ) 10 MG tablet TAKE 1 TABLET(10 MG) BY MOUTH DAILY   aspirin  81 MG tablet Take 81 mg by mouth daily.   blood glucose meter kit and supplies Dispense based on patient and insurance preference. Use daily. (FOR ICD-10 E10.9, E11.9).   Blood Glucose Monitoring Suppl DEVI 1 each by Does not apply route in the morning, at noon, and at bedtime. May substitute to any manufacturer covered by patient's insurance.   Blood Glucose Monitoring Suppl DEVI 1 each by Does not apply route in the morning, at noon, and at bedtime. May substitute to any manufacturer covered by patient's insurance.   Continuous Glucose Receiver (FREESTYLE LIBRE 3 READER) DEVI Use to check blood sugar for type 2 diabetes on insulin    Continuous Glucose Sensor (FREESTYLE LIBRE 3 SENSOR) MISC Place 1 sensor on the skin every 14 days. Use to check glucose continuously   cyclobenzaprine  (FLEXERIL ) 10 MG tablet  Take 1 tablet (10 mg total) by mouth 3 (three) times daily as needed.   glipiZIDE  (GLUCOTROL  XL) 5 MG 24 hr tablet Take 1 tablet (5 mg total) by mouth 2 (two) times daily.   glucose blood (FREESTYLE LITE) test strip USE IN THE MORNING, AT NOON, AND AT BEDTIME   HYDROcodone -acetaminophen  (NORCO/VICODIN) 5-325 MG tablet Take 1-2 tablets by mouth every 6 (six) hours as needed for moderate pain (pain score 4-6).   Insulin  Pen Needle  (PEN NEEDLES 5/16) 30G X 8 MM MISC 100 each by Does not apply route at bedtime.   loperamide  (IMODIUM  A-D) 2 MG tablet Take 1 tablet (2 mg total) by mouth 4 (four) times daily as needed for diarrhea or loose stools.   methocarbamol  (ROBAXIN ) 750 MG tablet Take 1 tablet (750 mg total) by mouth every 8 (eight) hours as needed for muscle spasms.   metoprolol  succinate (TOPROL -XL) 25 MG 24 hr tablet TAKE 1 TABLET(25 MG) BY MOUTH DAILY   nitroGLYCERIN  (NITROSTAT ) 0.4 MG SL tablet Place 1 tablet (0.4 mg total) under the tongue every 5 (five) minutes as needed for chest pain. Call 9-1-1 if you take 3 tablets.   ondansetron  (ZOFRAN -ODT) 4 MG disintegrating tablet Take 1 tablet (4 mg total) by mouth every 8 (eight) hours as needed for nausea or vomiting.   QUEtiapine  (SEROQUEL ) 50 MG tablet Take 1 tablet (50 mg total) by mouth at bedtime.   rosuvastatin  (CRESTOR ) 40 MG tablet TAKE 1 TABLET(40 MG) BY MOUTH DAILY   sitaGLIPtin  (JANUVIA ) 100 MG tablet Take 1 tablet (100 mg total) by mouth daily.   tamsulosin  (FLOMAX ) 0.4 MG CAPS capsule Take 2 capsules (0.8 mg total) by mouth daily.   No facility-administered medications prior to visit.        Objective    BP 130/86 (BP Location: Right Arm, Patient Position: Sitting, Cuff Size: Large)   Pulse 84   Temp 98.8 F (37.1 C) (Oral)   Ht 6' 3 (1.905 m)   Wt 266 lb (120.7 kg)   SpO2 98%   BMI 33.25 kg/m     Physical Exam Vitals and nursing note reviewed.  Constitutional:      General: He is not in acute distress.    Appearance: Normal appearance.  HENT:     Head: Normocephalic and atraumatic.  Eyes:     General: No scleral icterus.    Conjunctiva/sclera: Conjunctivae normal.  Cardiovascular:     Rate and Rhythm: Normal rate.  Pulmonary:     Effort: Pulmonary effort is normal.  Neurological:     Mental Status: He is alert and oriented to person, place, and time. Mental status is at baseline.  Psychiatric:        Mood and Affect: Mood  normal.        Behavior: Behavior normal.      No results found for any visits on 04/21/24.  Assessment & Plan    Cyclical vomiting -     Ambulatory referral to Gastroenterology  Marijuana dependence (HCC)  Chronic low back pain (1ry area of Pain) (Bilateral) w/o sciatica  Type 2 diabetes mellitus with peripheral neuropathy (HCC)  Internal hemorrhoids      Cyclic vomiting; marijuana dependence Intermittent vomiting and diarrhea likely linked to cannabis use. Symptoms include severe abdominal pain and vomiting with a metallic smell. Daily cannabis use noted, with recent reduction. - Referred to GI specialist for further evaluation. - Advised cessation of marijuana use to assess impact on symptoms. - Monitor  for recurrence of symptoms after cessation of marijuana.  Chronic low back pain without sciatica Chronic back pain due to degenerative disc disease. Previous imaging showed spinal straightening and hip joint space narrowing. He declined surgical intervention. - Contact pain management specialists to schedule follow-up appointment.  Type 2 diabetes mellitus with peripheral neuropathy Type 2 diabetes managed with Januvia  and lifestyle modifications. He is aware of the need for regular eye exams. - Recommended annual eye exam to monitor for diabetes-related changes. - Continue current diabetes management plan.  Follow-up with PCP.  Internal hemorrhoids Non-bleeding, non-prolapsed internal hemorrhoids noted on previous colonoscopy in 2022. - Advised avoiding straining and heavy lifting to prevent exacerbation.    Return in about 6 weeks (around 06/05/2024), or if symptoms worsen or fail to improve, for DM/chronic f/u with PCP.      I discussed the assessment and treatment plan with the patient  The patient was provided an opportunity to ask questions and all were answered. The patient agreed with the plan and demonstrated an understanding of the instructions.   The  patient was advised to call back or seek an in-person evaluation if the symptoms worsen or if the condition fails to improve as anticipated.    LAURAINE LOISE BUOY, DO  [facility] [phone] (phone) [phone] (fax)  [facility]

## 2024-04-25 ENCOUNTER — Other Ambulatory Visit: Payer: Self-pay | Admitting: Family Medicine

## 2024-04-25 DIAGNOSIS — Z955 Presence of coronary angioplasty implant and graft: Secondary | ICD-10-CM

## 2024-04-25 DIAGNOSIS — E1140 Type 2 diabetes mellitus with diabetic neuropathy, unspecified: Secondary | ICD-10-CM

## 2024-04-25 DIAGNOSIS — I152 Hypertension secondary to endocrine disorders: Secondary | ICD-10-CM

## 2024-04-25 DIAGNOSIS — M25532 Pain in left wrist: Secondary | ICD-10-CM

## 2024-04-25 DIAGNOSIS — E1169 Type 2 diabetes mellitus with other specified complication: Secondary | ICD-10-CM

## 2024-04-25 DIAGNOSIS — M545 Low back pain, unspecified: Secondary | ICD-10-CM

## 2024-04-25 NOTE — Telephone Encounter (Signed)
 Copied from CRM [id number]. Topic: Clinical - Medication Refill >> Apr 25, 2024  1:52 PM Antwanette L wrote: Medication:  amLODipine  (NORVASC ) 10 MG tablet  glucose blood (FREESTYLE LITE) test strip glipiZIDE  (GLUCOTROL  XL) 5 MG 24 hr tablet HYDROcodone -acetaminophen  (NORCO/VICODIN) 5-325 MG tablet metoprolol  succinate (TOPROL -XL) 25 MG 24 hr tablet  nitroGLYCERIN  (NITROSTAT ) 0.4 MG SL tablet QUEtiapine  (SEROQUEL ) 50 MG tablet rosuvastatin  (CRESTOR ) 40 MG tablet    Has the patient contacted their pharmacy? Yes   This is the patient's preferred pharmacy:  [id number] - [place], [place] - [address] AT [facility] OF [address] [place] [address] Phone: [phone] Fax: [phone]  Is this the correct pharmacy for this prescription? Yes   Has the prescription been filled recently? Yes  Is the patient out of the medication? Yes  Has the patient been seen for an appointment in the last year OR does the patient have an upcoming appointment? Yes. Last ov with Dr. Gasper was on 11/15/23, pt saw Dr. Donzella on 04/21/24 and next appt with Dr. Gasper is on 06/05/24  Can we respond through MyChart? No. Contact the patient by phone at [phone]  Agent: Please be advised that Rx refills may take up to 3 business days. We ask that you follow-up with your pharmacy.

## 2024-04-26 ENCOUNTER — Other Ambulatory Visit: Payer: Self-pay | Admitting: Family Medicine

## 2024-04-26 DIAGNOSIS — I152 Hypertension secondary to endocrine disorders: Secondary | ICD-10-CM

## 2024-04-28 NOTE — Telephone Encounter (Signed)
 Requested medication (s) are due for refill today: yes  Requested medication (s) are on the active medication list: yes  Last refill:  multiple dates  Future visit scheduled: yes  Notes to clinic:  Unable to refill per protocol, cannot delegate. Last refilled by another provider, routing for review.      Requested Prescriptions  Pending Prescriptions Disp Refills   amLODipine  (NORVASC ) 10 MG tablet 90 tablet 1    Sig: TAKE 1 TABLET(10 MG) BY MOUTH DAILY     Cardiovascular: Calcium  Channel Blockers 2 Passed - 04/28/2024  9:28 AM      Passed - Last BP in normal range    BP Readings from Last 1 Encounters:  04/21/24 130/86         Passed - Last Heart Rate in normal range    Pulse Readings from Last 1 Encounters:  04/21/24 84         Passed - Valid encounter within last 6 months    Recent Outpatient Visits           1 week ago Cyclical vomiting   [place] [facility] [place], Lauraine SAILOR, DO   5 months ago Generalized abdominal pain   [place] [facility] Gas[place] Nancyann BRAVO, MD   9 months ago Type 2 diabetes mellitus with peripheral neuropathy [facility])   [place] [facility] Gasper Nancyann BRAVO, MD               glucose blood (FREESTYLE LITE) test strip 100 strip 3    Sig: USE IN THE MORNING, AT NOON, AND AT BEDTIME     Endocrinology: Diabetes - Testing Supplies Passed - 04/28/2024  9:28 AM      Passed - Valid encounter within last 12 months    Recent Outpatient Visits           1 week ago Cyclical vomiting   [facility], Lauraine SAILOR, DO   5 months ago Generalized abdominal pain   [place] [facility] Gasper Nancyann BRAVO, MD   9 months ago Type 2 diabetes mellitus with peripheral neuropathy [facility])   [place] [facility] Gas[place] Nancyann BRAVO, MD               glipiZIDE  (GLUCOTROL  XL) 5 MG 24 hr tablet 180 tablet 2    Sig: Take 1 tablet (5 mg total) by  mouth 2 (two) times daily.     Endocrinology:  Diabetes - Sulfonylureas Failed - 04/28/2024  9:28 AM      Failed - Cr in normal range and within 360 days    Creat  Date Value Ref Range Status  10/13/2010 0.78 0.40 - 1.50 mg/dL Final   Creatinine, Ser  Date Value Ref Range Status  12/27/2023 1.28 (H) 0.76 - 1.27 mg/dL Final         Passed - HBA1C is between 0 and 7.9 and within 180 days    Hemoglobin A1C  Date Value Ref Range Status  11/15/2023 6.6 (A) 4.0 - 5.6 % Final  03/03/2023 7.2  Final   Hgb A1c MFr Bld  Date Value Ref Range Status  07/21/2023 7.1 (H) 4.8 - 5.6 % Final    Comment:             Prediabetes: 5.7 - 6.4          Diabetes: >6.4          Glycemic control for adults  with diabetes: <7.0          Passed - Valid encounter within last 6 months    Recent Outpatient Visits           1 week ago Cyclical vomiting   [place] [facility] [place], Lauraine SAILOR, DO   5 months ago Generalized abdominal pain   [place] [facility] Gas[facility] Nancyann BRAVO, MD   9 months ago Type 2 diabetes mellitus with peripheral neuropathy [facility])   [place] [facility] Gasper Nancyann BRAVO, MD               HYDROcodone -acetaminophen  (NORCO/VICODIN) 5-325 MG tablet 40 tablet 0    Sig: Take 1-2 tablets by mouth every 6 (six) hours as needed for moderate pain (pain score 4-6).     Not Delegated - Analgesics:  Opioid Agonist Combinations Failed - 04/28/2024  9:28 AM      Failed - This refill cannot be delegated      Failed - Urine Drug Screen completed in last 360 days      Failed - Valid encounter within last 3 months    Recent Outpatient Visits           1 week ago Cyclical vomiting   [place] [facility], Lauraine SAILOR, DO   5 months ago Generalized abdominal pain   [place] [facility] Gasper Nancyann BRAVO, MD   9 months ago Type 2 diabetes mellitus with peripheral neuropathy [facility])   [place]  [facility] Gasper Nancyann BRAVO, MD               metoprolol  succinate (TOPROL -XL) 25 MG 24 hr tablet 90 tablet 1     Cardiovascular:  Beta Blockers Passed - 04/28/2024  9:28 AM      Passed - Last BP in normal range    BP Readings from Last 1 Encounters:  04/21/24 130/86         Passed - Last Heart Rate in normal range    Pulse Readings from Last 1 Encounters:  04/21/24 84         Passed - Valid encounter within last 6 months    Recent Outpatient Visits           1 week ago Cyclical vomiting   [place] [facility], Laur[facility] SAILOR, DO   5 months ago Generalized abdominal pain   [place] [facility] Gas[facility] Nancyann BRAVO, MD   9 months ago Type 2 diabetes mellitus with peripheral neuropathy [facility])   [place] [facility] Gasper Nancyann BRAVO, MD               nitroGLYCERIN  (NITROSTAT ) 0.4 MG SL tablet 25 tablet 1    Sig: Place 1 tablet (0.4 mg total) under the tongue every 5 (five) minutes as needed for chest pain. Call 9-1-1 if you take 3 tablets.     Cardiovascular:  Nitrates Passed - 04/28/2024  9:28 AM      Passed - Last BP in normal range    BP Readings from Last 1 Encounters:  04/21/24 130/86         Passed - Last Heart Rate in normal range    Pulse Readings from Last 1 Encounters:  04/21/24 84         Passed - Valid encounter within last 12 months    Recent Outpatient Visits  1 week ago Cyclical vomiting   [facility], Lauraine SAILOR, DO   5 months ago Generalized abdominal pain   [place] Gasper Nancyann BRAVO, MD   9 months ago Type 2 diabetes mellitus with peripheral neuropathy [facility])   [place] [facility] Gas[facility] Nancyann BRAVO, MD               QUEtiapine  (SEROQUEL ) 50 MG tablet 90 tablet 0    Sig: Take 1 tablet (50 mg total) by mouth at bedtime.     Not Delegated - Psychiatry:  Antipsychotics - Second  Generation (Atypical) - quetiapine  Failed - 04/28/2024  9:28 AM      Failed - This refill cannot be delegated      Failed - Lipid Panel in normal range within the last 12 months    Cholesterol, Total  Date Value Ref Range Status  09/24/2022 125 100 - 199 mg/dL Final   LDL Chol Calc (NIH)  Date Value Ref Range Status  09/24/2022 63 0 - 99 mg/dL Final   LDL Direct  Date Value Ref Range Status  06/24/2018 129 (H) 0 - 99 mg/dL Final   HDL  Date Value Ref Range Status  09/24/2022 36 (L) >39 mg/dL Final   Triglycerides  Date Value Ref Range Status  09/24/2022 149 0 - 149 mg/dL Final         Failed - CBC within normal limits and completed in the last 12 months    WBC  Date Value Ref Range Status  11/03/2023 7.7 4.0 - 10.5 K/uL Final   RBC  Date Value Ref Range Status  11/03/2023 4.61 4.22 - 5.81 MIL/uL Final   Hemoglobin  Date Value Ref Range Status  11/03/2023 14.1 13.0 - 17.0 g/dL Final  95/81/7975 86.1 13.0 - 17.7 g/dL Final   HCT  Date Value Ref Range Status  11/03/2023 42.7 39.0 - 52.0 % Final   Hematocrit  Date Value Ref Range Status  09/24/2022 41.5 37.5 - 51.0 % Final   MCHC  Date Value Ref Range Status  11/03/2023 33.0 30.0 - 36.0 g/dL Final   [facility]  Date Value Ref Range Status  11/03/2023 30.6 26.0 - 34.0 pg Final   MCV  Date Value Ref Range Status  11/03/2023 92.6 80.0 - 100.0 fL Final  09/24/2022 91 79 - 97 fL Final   No results found for: PLTCOUNTKUC, LABPLAT, POCPLA RDW  Date Value Ref Range Status  11/03/2023 13.6 11.5 - 15.5 % Final  09/24/2022 13.1 11.6 - 15.4 % Final         Passed - TSH in normal range and within 360 days    TSH  Date Value Ref Range Status  07/21/2023 0.733 0.450 - 4.500 uIU/mL Final         Passed - Last BP in normal range    BP Readings from Last 1 Encounters:  04/21/24 130/86         Passed - Last Heart Rate in normal range    Pulse Readings from Last 1 Encounters:  04/21/24 84         Passed -  Valid encounter within last 6 months    Recent Outpatient Visits           1 week ago Cyclical vomiting   [facility] [place], Laur[facility] SAILOR, DO   5 months ago Generalized abdominal pain   [place] [facility] [place],  Nancyann BRAVO, MD   9 months ago Type 2 diabetes mellitus with peripheral neuropathy [facility])   [place] [facility] Gasper Nancyann BRAVO, MD              Passed - CMP within normal limits and completed in the last 12 months    Albumin   Date Value Ref Range Status  12/27/2023 4.9 4.1 - 5.1 g/dL Final   Albumin  ELP  Date Value Ref Range Status  12/01/2022 3.9 2.9 - 4.4 g/dL Final   Alkaline Phosphatase  Date Value Ref Range Status  12/27/2023 68 44 - 121 IU/L Final   ALT  Date Value Ref Range Status  11/03/2023 24 0 - 44 U/L Final   AST  Date Value Ref Range Status  12/27/2023 18 0 - 40 IU/L Final   BUN  Date Value Ref Range Status  12/27/2023 14 6 - 24 mg/dL Final   Calcium   Date Value Ref Range Status  12/27/2023 10.3 (H) 8.7 - 10.2 mg/dL Final   Calcium , Ion  Date Value Ref Range Status  04/03/2009 1.23 1.12 - 1.32 mmol/L Final   CO2  Date Value Ref Range Status  11/03/2023 20 (L) 22 - 32 mmol/L Final   Bicarbonate  Date Value Ref Range Status  08/01/2020 22.9 20.0 - 28.0 mmol/L Final   TCO2  Date Value Ref Range Status  04/03/2009 28 0 - 100 mmol/L Final   Creat  Date Value Ref Range Status  10/13/2010 0.78 0.40 - 1.50 mg/dL Final   Creatinine, Ser  Date Value Ref Range Status  12/27/2023 1.28 (H) 0.76 - 1.27 mg/dL Final   Glucose  Date Value Ref Range Status  12/27/2023 156 (H) 70 - 99 mg/dL Final   Glucose, Bld  Date Value Ref Range Status  11/03/2023 132 (H) 70 - 99 mg/dL Final    Comment:    Glucose reference range applies only to samples taken after fasting for at least 8 hours.   Glucose-Capillary  Date Value Ref Range Status  02/01/2023 93 70 - 99 mg/dL Final     Comment:    Glucose reference range applies only to samples taken after fasting for at least 8 hours.   Potassium  Date Value Ref Range Status  12/27/2023 4.7 3.5 - 5.2 mmol/L Final   Sodium  Date Value Ref Range Status  12/27/2023 143 134 - 144 mmol/L Final   Bilirubin Total  Date Value Ref Range Status  12/27/2023 0.3 0.0 - 1.2 mg/dL Final   Bilirubin, Direct  Date Value Ref Range Status  11/23/2011 0.0 0.0 - 0.3 mg/dL Final   Protein, ur  Date Value Ref Range Status  11/03/2023 NEGATIVE NEGATIVE mg/dL Final   Total Protein  Date Value Ref Range Status  12/27/2023 7.9 6.0 - 8.5 g/dL Final   Total Protein ELP  Date Value Ref Range Status  12/01/2022 7.3 6.0 - 8.5 g/dL Final   GFR calc Af Amer  Date Value Ref Range Status  07/24/2020 110 >59 mL/min/1.73 Final    Comment:    **In accordance with recommendations from the NKF-ASN Task force,**   [facility] is in the process of updating its eGFR calculation to the   2021 CKD-EPI creatinine equation that estimates kidney function   without a race variable.    GFR  Date Value Ref Range Status  11/23/2011 113.69 >60.00 mL/min Final   eGFR  Date Value Ref Range Status  12/27/2023 70 >59 mL/min/1.73 Final  GFR, Estimated  Date Value Ref Range Status  11/03/2023 >60 >60 mL/min Final    Comment:    (NOTE) Calculated using the CKD-EPI Creatinine Equation (2021)           rosuvastatin  (CRESTOR ) 40 MG tablet 90 tablet 2    Sig: TAKE 1 TABLET(40 MG) BY MOUTH DAILY     Cardiovascular:  Antilipid - Statins 2 Failed - 04/28/2024  9:28 AM      Failed - Cr in normal range and within 360 days    Creat  Date Value Ref Range Status  10/13/2010 0.78 0.40 - 1.50 mg/dL Final   Creatinine, Ser  Date Value Ref Range Status  12/27/2023 1.28 (H) 0.76 - 1.27 mg/dL Final         Failed - Lipid Panel in normal range within the last 12 months    Cholesterol, Total  Date Value Ref Range Status  09/24/2022 125 100 - 199 mg/dL  Final   LDL Chol Calc (NIH)  Date Value Ref Range Status  09/24/2022 63 0 - 99 mg/dL Final   LDL Direct  Date Value Ref Range Status  06/24/2018 129 (H) 0 - 99 mg/dL Final   HDL  Date Value Ref Range Status  09/24/2022 36 (L) >39 mg/dL Final   Triglycerides  Date Value Ref Range Status  09/24/2022 149 0 - 149 mg/dL Final         Passed - Patient is not pregnant      Passed - Valid encounter within last 12 months    Recent Outpatient Visits           1 week ago Cyclical vomiting   [place] [facility], Lauraine SAILOR, DO   5 months ago Generalized abdominal pain   [place] [facility] Gasper Nancyann BRAVO, MD   9 months ago Type 2 diabetes mellitus with peripheral neuropathy [facility])   [place] [facility] Gasper Nancyann BRAVO, MD

## 2024-04-30 MED ORDER — FREESTYLE LITE TEST VI STRP: ORAL_STRIP | 11 refills | Status: AC

## 2024-04-30 MED ORDER — METOPROLOL SUCCINATE ER 25 MG PO TB24: 25.0000 mg | ORAL_TABLET | Freq: Every day | ORAL | 2 refills | Status: AC

## 2024-04-30 MED ORDER — GLIPIZIDE ER 5 MG PO TB24: 5.0000 mg | ORAL_TABLET | Freq: Two times a day (BID) | ORAL | 2 refills | Status: AC

## 2024-04-30 MED ORDER — HYDROCODONE-ACETAMINOPHEN 5-325 MG PO TABS: 1.0000 | ORAL_TABLET | Freq: Four times a day (QID) | ORAL | 0 refills | Status: AC | PRN

## 2024-04-30 MED ORDER — NITROGLYCERIN 0.4 MG SL SUBL: 0.4000 mg | SUBLINGUAL_TABLET | SUBLINGUAL | 1 refills | Status: AC | PRN

## 2024-04-30 MED ORDER — ROSUVASTATIN CALCIUM 40 MG PO TABS: ORAL_TABLET | ORAL | 3 refills | Status: AC

## 2024-05-14 NOTE — Progress Notes (Unsigned)
 Cardiology Office Note   Date:  05/15/2024  ID:  [name] [name], DOB [birth year], MRN [id number] PCP: Gasper Nancyann BRAVO, MD  [facility] Providers Cardiologist:  Lonni Hanson, MD Cardiology APP:  Orie Ritta BIRCH, PA-C (Inactive)   History of Present Illness [name] [name] is a 48 y.o. male with a hx of CAD s/p BMS to LAD in 2012, hypertension, hyperlipidemia, diabetes, obesity, OSA, ADHD, polysubstance abuse who presents for follow-up.   Patient has a history of CAD status post BMS to the LAD in 2012.  ETT in 2019 was low risk, but demonstrated hypertensive response.   The patient was last seen 04/2023 and was doing well from a cardiac perspective.   Today, the patient reports he was having abdominal issues from marijuana. He has not had abdominal issues since quitting smoking marijuana 1 month ago. He denies chest pain, SOB, lower leg edema, orthopnea, pnd. He doesn't use his CPAP. He denies heart racing, palpitations, lightheadedness. He is trying to eat healthy. He works out 5 days a week. He quit smoking cigarettes 3 years ago.   Studies Reviewed EKG Interpretation Date/Time:  Monday May 15 2024 08:10:20 EST Ventricular Rate:  75 PR Interval:  218 QRS Duration:  102 QT Interval:  396 QTC Calculation: 442 R Axis:   -12  Text Interpretation: Sinus rhythm with 1st degree A-V block When compared with ECG of 27-Jan-2023 14:07, No significant change was found Confirmed by [name], Amiyah Shryock ([id number]) on 05/15/2024 8:13:05 AM    ETT 2019  Baseline EKG demonstrates normal sinus rhythm. Patient demonstrates normal exercise capacity and heart rate response. Blood pressure response was hypertensive. No significant ST segment or T wave abnormalities are noted during stress or recovery. No significant arrhythmia is seen during stress or recovery. Low risk exercise tolerance test (Duke Treadmill Score = 9)   Hypertensive blood pressure response.  No EKG changes at maximal  workload to suggest ischemia.       Physical Exam VS:  BP 122/82 (BP Location: Left Arm, Patient Position: Sitting, Cuff Size: Normal)   Pulse 75   Ht 6' 3 (1.905 m)   Wt 272 lb (123.4 kg)   SpO2 98%   BMI 34.00 kg/m        Wt Readings from Last 3 Encounters:  05/15/24 272 lb (123.4 kg)  04/21/24 266 lb (120.7 kg)  12/27/23 270 lb (122.5 kg)    GEN: Well nourished, well developed in no acute distress NECK: No JVD; No carotid bruits CARDIAC: RRR, no murmurs, rubs, gallops RESPIRATORY:  Clear to auscultation without rales, wheezing or rhonchi  ABDOMEN: Soft, non-tender, non-distended EXTREMITIES:  No edema; No deformity   ASSESSMENT AND PLAN  CAD s/p remote stenting. Patient denies any anginal symptoms.  The patient works out 3-5 times a week with no exertional symptoms.  Diet is fairly healthy.  No further ischemic workup indicated at this time.  Continue aspirin  81 mg daily, Crestor  40 mg daily, Toprol  25 mg daily, sublingual nitroglycerin .  HTN Blood pressure is well-controlled today.  Continue amlodipine  10 mg daily and Toprol  25 mg daily.  HLD LDL 63.  Continue Crestor  40 mg daily. We will bring him back to check fasting lipid panel.   DM2 A1C 6.6.  This is followed by PCP.  OSA Patient does not use a CPAP.  I recommended he follow-up with primary care or pulmonology for further management.       Dispo: Follow-up in 1 year  Signed, Grahm Etsitty VEAR Franc[place]ster,  PA-C

## 2024-05-15 ENCOUNTER — Ambulatory Visit: Attending: Medical | Admitting: Medical

## 2024-05-15 ENCOUNTER — Telehealth: Payer: Self-pay

## 2024-05-15 ENCOUNTER — Encounter: Payer: Self-pay | Admitting: Medical

## 2024-05-15 VITALS — BP 122/82 | HR 75 | Ht 75.0 in | Wt 272.0 lb

## 2024-05-15 DIAGNOSIS — G4733 Obstructive sleep apnea (adult) (pediatric): Secondary | ICD-10-CM

## 2024-05-15 DIAGNOSIS — I2510 Atherosclerotic heart disease of native coronary artery without angina pectoris: Secondary | ICD-10-CM | POA: Diagnosis not present

## 2024-05-15 DIAGNOSIS — Z794 Long term (current) use of insulin: Secondary | ICD-10-CM | POA: Diagnosis not present

## 2024-05-15 DIAGNOSIS — I10 Essential (primary) hypertension: Secondary | ICD-10-CM | POA: Diagnosis not present

## 2024-05-15 DIAGNOSIS — E782 Mixed hyperlipidemia: Secondary | ICD-10-CM

## 2024-05-15 DIAGNOSIS — Z955 Presence of coronary angioplasty implant and graft: Secondary | ICD-10-CM | POA: Diagnosis not present

## 2024-05-15 DIAGNOSIS — E118 Type 2 diabetes mellitus with unspecified complications: Secondary | ICD-10-CM

## 2024-05-15 NOTE — Telephone Encounter (Signed)
 Per pt would like to go to [facility] to see Dr. Therisa. Per pt he will call [phone] and he will call PCP to get the referral transferred to [facility]

## 2024-05-15 NOTE — Patient Instructions (Addendum)
 Medication Instructions:  Your physician recommends that you continue on your current medications as directed. Please refer to the Current Medication list given to you today.    *If you need a refill on your cardiac medications before your next appointment, please call your pharmacy*  Lab Work: Your provider would like for you to return in 1 month to have the following labs drawn: Lipid panel.   Please go to [facility] [address] [address], [place] [address] You do not need an appointment.  They are open from 8 am- 4:30 pm.  Lunch from 1:00 pm- 2:00 pm You will need to be fasting.    Testing/Procedures: No test ordered today   Follow-Up: At [facility], you and your health needs are our priority.  As part of our continuing mission to provide you with exceptional heart care, our providers are all part of one team.  This team includes your primary Cardiologist (physician) and Advanced Practice Providers or APPs (Physician Assistants and Nurse Practitioners) who all work together to provide you with the care you need, when you need it.  Your next appointment:   1 year(s)  Provider:   You may see Lonni Hanson, MD or one of the following Advanced Practice Providers on your designated Care Team:   Lonni Meager, NP Lesley Maffucci, PA-C Bernardino Bring, PA-C Cadence Platinum, PA-C Tylene Lunch, NP Barnie Hila, NP

## 2024-06-05 ENCOUNTER — Ambulatory Visit: Admitting: Family Medicine

## 2024-06-15 ENCOUNTER — Ambulatory Visit: Payer: Self-pay

## 2024-06-15 NOTE — Telephone Encounter (Signed)
 FYI Only or Action Required?: FYI only for provider: appointment scheduled on 06/16/2024.  Patient was last seen in primary care on 04/21/2024 by Donzella Lauraine SAILOR, DO.  Called Nurse Triage reporting Eye Problem.  Symptoms began several weeks ago.  Interventions attempted: Ice/heat application.  Symptoms are: gradually improving.  Triage Disposition: See PCP When Office is Open (Within 3 Days)  Patient/caregiver understands and will follow disposition?: Yes  Copied from CRM #[id number]. Topic: Clinical - Red Word Triage >> Jun 15, 2024  2:10 PM Avram MATSU wrote: Red Word that prompted transfer to Nurse Triage: pt has a swollen right eye, also itchy and irritated with crust in the morning. Also stated he moves left to right Reason for Disposition  [1] MILD eyelid swelling (puffiness) AND [2] persists > 3 days  (Exception: Suspect mosquito bites.)  Answer Assessment - Initial Assessment Questions 1. ONSET: When did the swelling start? (e.g., minutes, hours, days)     A couple weeks ago, maybe longer 2. LOCATION: What part of the eyelids is swollen?     Right eye swelling. Left eye swelling has resolved. 3. SEVERITY: How swollen is it?     Just bottom lid is swelling. Put sty medicine in there yesterday 4. ITCHING: Is there any itching? If Yes, ask: How much?   (Scale 1-10; mild, moderate or severe)     Itchy 5. PAIN: Is the swelling painful to touch? If Yes, ask: How painful is it?   (Scale 1-10; mild, moderate or severe)     Denies 6. FEVER: Do you have a fever? If Yes, ask: What is it, how was it measured, and when did it start?      Denies 7. CAUSE: What do you think is causing the swelling?     Unsure 8. RECURRENT SYMPTOM: Have you had eyelid swelling before? If Yes, ask: When was the last time? What happened that time?     Denies 9. OTHER SYMPTOMS: Do you have any other symptoms? (e.g., blurred vision, eye discharge, rash, runny nose)     Blurry vision at  times, and some crusting  Protocols used: Eye - Swelling-A-AH

## 2024-06-16 ENCOUNTER — Encounter: Payer: Self-pay | Admitting: Physician Assistant

## 2024-06-16 ENCOUNTER — Ambulatory Visit (INDEPENDENT_AMBULATORY_CARE_PROVIDER_SITE_OTHER): Admitting: Physician Assistant

## 2024-06-16 VITALS — BP 136/94 | HR 84 | Resp 16 | Wt 273.0 lb

## 2024-06-16 DIAGNOSIS — E1159 Type 2 diabetes mellitus with other circulatory complications: Secondary | ICD-10-CM | POA: Diagnosis not present

## 2024-06-16 DIAGNOSIS — I152 Hypertension secondary to endocrine disorders: Secondary | ICD-10-CM | POA: Diagnosis not present

## 2024-06-16 DIAGNOSIS — J3089 Other allergic rhinitis: Secondary | ICD-10-CM | POA: Diagnosis not present

## 2024-06-16 DIAGNOSIS — H5789 Other specified disorders of eye and adnexa: Secondary | ICD-10-CM | POA: Diagnosis not present

## 2024-06-16 DIAGNOSIS — H119 Unspecified disorder of conjunctiva: Secondary | ICD-10-CM | POA: Diagnosis not present

## 2024-06-16 DIAGNOSIS — E1140 Type 2 diabetes mellitus with diabetic neuropathy, unspecified: Secondary | ICD-10-CM | POA: Diagnosis not present

## 2024-06-16 MED ORDER — CETIRIZINE HCL 10 MG PO TABS: 10.0000 mg | ORAL_TABLET | Freq: Every day | ORAL | 11 refills | Status: AC

## 2024-06-16 MED ORDER — OLOPATADINE HCL 0.1 % OP SOLN: 1.0000 [drp] | Freq: Two times a day (BID) | OPHTHALMIC | 11 refills | Status: AC

## 2024-06-16 MED ORDER — FLUTICASONE PROPIONATE 50 MCG/ACT NA SUSP: 2.0000 | Freq: Every day | NASAL | 6 refills | Status: AC

## 2024-06-18 NOTE — Progress Notes (Signed)
 " Established patient visit  Patient: [name] [name]   DOB: 1975-11-21   49 y.o. Male  MRN: [id number] Visit Date: 06/16/2024  Today's healthcare provider: Jolynn Spencer, PA-C   Chief Complaint  Patient presents with   Belepharitis    Right eye. Using a OTC stye ointment- helping with the itching Eye Discharge-clear   Subjective     HPI     Belepharitis    Additional comments: Right eye. Using a OTC stye ointment- helping with the itching Eye Discharge-clear      Last edited by Rosas, Joseline E, CMA on 06/16/2024  2:16 PM.       Discussed the use of AI scribe software for clinical note transcription with the patient, who gave verbal consent to proceed.  History of Present Illness [name] [name] is a 49 year old male with diabetes and coronary artery disease who presents with swelling of the bottom eyelid.  He has had swelling of the bottom eyelid for several weeks, worse in the morning with some discharge. It improves slightly during the day but stays puffy and itchy. Hot compresses reduce swelling. Styremove eased itching but did not reduce swelling.  He recently had a suspected sinus infection and still has post-nasal drainage and nasal congestion, which he thinks may be due to allergies.  His medical problems include coronary artery disease with prior stent placement and diabetes. He takes amlodipine , glipizide , metoprolol , nitroglycerin , rosuvastatin , and Januvia . His last A1c in June was 6.6. He notes recent weight loss with some regain due to decreased physical activity.  He denies eyelid pain, muffled hearing, shortness of breath, or rapid heart beating.       12/27/2023    8:47 AM 11/15/2023    1:45 PM 07/21/2023   10:01 AM  Depression screen PHQ 2/9  Decreased Interest 0 0 1  Down, Depressed, Hopeless 0 0 0  PHQ - 2 Score 0 0 1      11/15/2023    1:45 PM 07/21/2023   10:01 AM  GAD 7 : Generalized Anxiety Score  Nervous, Anxious, on Edge 1 1  Control/stop  worrying 0 1  Worry too much - different things 1 1  Trouble relaxing 2 1  Restless 1 1  Easily annoyed or irritable 1 1  Afraid - awful might happen 0 0  Total GAD 7 Score 6 6  Anxiety Difficulty Not difficult at all Somewhat difficult    Medications: Show/hide medication list[1]  Review of Systems All negative Except see HPI       Objective    BP (!) 136/94 (BP Location: Right Arm, Patient Position: Sitting, Cuff Size: Large)   Pulse 84   Resp 16   Wt 273 lb (123.8 kg)   SpO2 99%   BMI 34.12 kg/m     Physical Exam Vitals reviewed.  Constitutional:      General: He is not in acute distress.    Appearance: Normal appearance. He is not diaphoretic.  HENT:     Head: Normocephalic and atraumatic.  Eyes:     General: No scleral icterus.    Conjunctiva/sclera: Conjunctivae normal.  Cardiovascular:     Rate and Rhythm: Normal rate and regular rhythm.     Pulses: Normal pulses.     Heart sounds: Normal heart sounds. No murmur heard. Pulmonary:     Effort: Pulmonary effort is normal. No respiratory distress.     Breath sounds: Normal breath sounds. No wheezing or rhonchi.  Musculoskeletal:  Cervical back: Neck supple.     Right lower leg: No edema.     Left lower leg: No edema.  Lymphadenopathy:     Cervical: No cervical adenopathy.  Skin:    General: Skin is warm and dry.     Findings: No rash.  Neurological:     Mental Status: He is alert and oriented to person, place, and time. Mental status is at baseline.  Psychiatric:        Mood and Affect: Mood normal.        Behavior: Behavior normal.      No results found for any visits on 06/16/24.      Assessment & Plan Allergic conjunctivitis and rhinitis Chronic eyelid swelling and itching with nasal congestion and post-nasal drainage, likely allergic. No fever or sinus pressure. Possible environmental or marijuana cessation factors. - Start Zyrtec  daily in the morning. - Continue warm compresses to  eyes. - Use nasal saline spray or gel. - Use Flonase  twice daily for one week, then once daily. - Prescribed Pataday  eye drops twice daily. - Advised cleaning nasal passages and eyes before medications. 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 Recent weight changes noted. Emphasized diabetes management to prevent complications. - Continue glipizide  5 bid and Januvia  100. Continue lifestyle modifications Will follow-up  History of coronary artery disease with stent placement Coronary artery disease with stent placement. No current chest pain or dyspnea. Slightly elevated blood pressure. - Continue amlodipine  10, metoprolol  25, nitroglycerin  0.4, and rosuvastatin  40. Follow-up with cardiology  Hypertension associated with diabetes (HCC) (Primary) Chronic and unstable  Continue amlodipine  10, metoprolol  25 Continue lifestyle modifications Will follow-up  Eye swelling  - fluticasone  (FLONASE ) 50 MCG/ACT nasal spray; Place 2 sprays into both nostrils daily.  Dispense: 16 g; Refill: 6 - cetirizine  (ZYRTEC ) 10 MG tablet; Take 1 tablet (10 mg total) by mouth daily.  Dispense: 30 tablet; Refill: 11 - olopatadine  (PATADAY ) 0.1 % ophthalmic solution; Place 1 drop into both eyes 2 (two) times daily.  Dispense: 5 mL; Refill: 11  No orders of the defined types were placed in this encounter.   No follow-ups on file.   The patient was advised to call back or seek an in-person evaluation if the symptoms worsen or if the condition fails to improve as anticipated.  I discussed the assessment and treatment plan with the patient. The patient was provided an opportunity to ask questions and all were answered. The patient agreed with the plan and demonstrated an understanding of the instructions.  I, Miyako Oelke, PA-C have reviewed all documentation for this visit. The documentation  on 06/16/2024  for the exam, diagnosis, procedures, and orders are all accurate and complete.  Jolynn Spencer, [facility], MMS [facility] [phone] (phone) [phone] (fax)  [facility]    [1]  Outpatient Medications Prior to Visit  Medication Sig   Accu-Chek Softclix Lancets lancets Use to check blood sugar daily for type 2 diabetes. E11.9   amLODipine  (NORVASC ) 10 MG tablet TAKE 1 TABLET(10 MG) BY MOUTH DAILY   aspirin  81 MG tablet Take 81 mg by mouth daily.   blood glucose meter kit and supplies Dispense based on patient and insurance preference. Use daily. (FOR ICD-10 E10.9, E11.9).   Blood Glucose Monitoring Suppl DEVI 1 each by Does not apply route in the morning, at noon, and at bedtime. May substitute to any manufacturer  covered by patient's insurance.   Blood Glucose Monitoring Suppl DEVI 1 each by Does not apply route in the morning, at noon, and at bedtime. May substitute to any manufacturer covered by patient's insurance.   Continuous Glucose Receiver (FREESTYLE LIBRE 3 READER) DEVI Use to check blood sugar for type 2 diabetes on insulin    Continuous Glucose Sensor (FREESTYLE LIBRE 3 SENSOR) MISC Place 1 sensor on the skin every 14 days. Use to check glucose continuously   cyclobenzaprine  (FLEXERIL ) 10 MG tablet Take 1 tablet (10 mg total) by mouth 3 (three) times daily as needed.   glipiZIDE  (GLUCOTROL  XL) 5 MG 24 hr tablet Take 1 tablet (5 mg total) by mouth 2 (two) times daily.   glucose blood (FREESTYLE LITE) test strip USE IN THE MORNING, AT NOON, AND AT BEDTIME   HYDROcodone -acetaminophen  (NORCO/VICODIN) 5-325 MG tablet Take 1-2 tablets by mouth every 6 (six) hours as needed for moderate pain (pain score 4-6).   Insulin  Pen Needle (PEN NEEDLES 5/16) 30G X 8 MM MISC 100 each by Does not apply route at bedtime.   loperamide  (IMODIUM  A-D) 2 MG tablet Take 1 tablet (2 mg total) by mouth 4 (four) times daily as needed for diarrhea or loose stools.    methocarbamol  (ROBAXIN ) 750 MG tablet Take 1 tablet (750 mg total) by mouth every 8 (eight) hours as needed for muscle spasms.   metoprolol  succinate (TOPROL -XL) 25 MG 24 hr tablet Take 1 tablet (25 mg total) by mouth daily.   nitroGLYCERIN  (NITROSTAT ) 0.4 MG SL tablet Place 1 tablet (0.4 mg total) under the tongue every 5 (five) minutes as needed for chest pain. Call 9-1-1 if you take 3 tablets.   ondansetron  (ZOFRAN -ODT) 4 MG disintegrating tablet Take 1 tablet (4 mg total) by mouth every 8 (eight) hours as needed for nausea or vomiting.   QUEtiapine  (SEROQUEL ) 50 MG tablet Take 1 tablet (50 mg total) by mouth at bedtime.   rosuvastatin  (CRESTOR ) 40 MG tablet TAKE 1 TABLET(40 MG) BY MOUTH DAILY   sitaGLIPtin  (JANUVIA ) 100 MG tablet Take 1 tablet (100 mg total) by mouth daily.   tamsulosin  (FLOMAX ) 0.4 MG CAPS capsule Take 2 capsules (0.8 mg total) by mouth daily.   diazepam  (VALIUM ) 5 MG tablet 1-2 30 minutes before MRI   No facility-administered medications prior to visit.   "

## 2024-07-06 ENCOUNTER — Other Ambulatory Visit: Payer: Self-pay | Admitting: Family Medicine

## 2024-07-06 DIAGNOSIS — E1140 Type 2 diabetes mellitus with diabetic neuropathy, unspecified: Secondary | ICD-10-CM

## 2024-07-10 ENCOUNTER — Telehealth: Payer: Self-pay

## 2024-07-10 ENCOUNTER — Other Ambulatory Visit: Payer: Self-pay | Admitting: Family Medicine

## 2024-07-10 DIAGNOSIS — E1165 Type 2 diabetes mellitus with hyperglycemia: Secondary | ICD-10-CM

## 2024-07-10 NOTE — Telephone Encounter (Signed)
 Copied from CRM #[id number]. Topic: Clinical - Medication Refill >> Jul 10, 2024  1:40 PM Darshell M wrote: Medication: Accu-Chek Softclix Lancets lancets -  Patient has refill from [facility] from January 2024 that shows 4 refills but patient wants the prescription to be filled at the [facility] listed below.  Has the patient contacted their pharmacy? Yes (Agent: If no, request that the patient contact the pharmacy for the refill. If patient does not wish to contact the pharmacy document the reason why and proceed with request.) (Agent: If yes, when and what did the pharmacy advise?)  This is the patient's preferred pharmacy:  [id number] [place], [place] - [address] AT [facility] OF [address] [place] [address] [place] [address] Phone: [phone] Fax: [phone]  Is this the correct pharmacy for this prescription? Yes If no, delete pharmacy and type the correct one.   Has the prescription been filled recently? No  Is the patient out of the medication? Yes  Has the patient been seen for an appointment in the last year OR does the patient have an upcoming appointment? Yes  Can we respond through MyChart? No  Agent: Please be advised that Rx refills may take up to 3 business days. We ask that you follow-up with your pharmacy.

## 2024-07-10 NOTE — Telephone Encounter (Signed)
 Copied from CRM [id number]. Topic: Clinical - Prescription Issue >> Jul 10, 2024  1:46 PM Darshell M wrote: Reason for CRM: Patient taking amlodipine , metoprolol  ER, januvia , rovustatin, glipaside, aspirin  and vitamins. Patient if the ashwagandha conflicts with any of these medications. Patient CB # [phone]

## 2024-07-11 MED ORDER — ACCU-CHEK SOFTCLIX LANCETS MISC: 3 refills | Status: AC

## 2024-07-11 NOTE — Telephone Encounter (Signed)
 Yes, it can interact with metoprolol , Januvia , and glipizide . Do not recommend taking it with those medications.
